# Patient Record
Sex: Male | Born: 1965 | Race: White | Hispanic: No | Marital: Married | Smoking: Former smoker
Health system: Southern US, Community
[De-identification: ages and names within clinical notes are randomized; demographics above are authoritative.]

## PROBLEM LIST (undated history)

## (undated) DIAGNOSIS — E119 Type 2 diabetes mellitus without complications: Secondary | ICD-10-CM

## (undated) DIAGNOSIS — I10 Essential (primary) hypertension: Secondary | ICD-10-CM

## (undated) DIAGNOSIS — I2510 Atherosclerotic heart disease of native coronary artery without angina pectoris: Secondary | ICD-10-CM

## (undated) DIAGNOSIS — I639 Cerebral infarction, unspecified: Secondary | ICD-10-CM

## (undated) DIAGNOSIS — I219 Acute myocardial infarction, unspecified: Secondary | ICD-10-CM

## (undated) DIAGNOSIS — E669 Obesity, unspecified: Secondary | ICD-10-CM

## (undated) DIAGNOSIS — J449 Chronic obstructive pulmonary disease, unspecified: Secondary | ICD-10-CM

## (undated) DIAGNOSIS — R0600 Dyspnea, unspecified: Secondary | ICD-10-CM

## (undated) DIAGNOSIS — E785 Hyperlipidemia, unspecified: Secondary | ICD-10-CM

## (undated) HISTORY — PX: NO PAST SURGERIES: SHX2092

---

## 2005-04-04 ENCOUNTER — Inpatient Hospital Stay (HOSPITAL_COMMUNITY): Admission: EM | Admit: 2005-04-04 | Discharge: 2005-04-07 | Payer: Self-pay | Admitting: Emergency Medicine

## 2006-01-12 ENCOUNTER — Emergency Department (HOSPITAL_COMMUNITY): Admission: EM | Admit: 2006-01-12 | Discharge: 2006-01-12 | Payer: Self-pay | Admitting: Emergency Medicine

## 2006-04-01 ENCOUNTER — Inpatient Hospital Stay (HOSPITAL_COMMUNITY): Admission: EM | Admit: 2006-04-01 | Discharge: 2006-04-02 | Payer: Self-pay | Admitting: Emergency Medicine

## 2013-11-04 ENCOUNTER — Other Ambulatory Visit (INDEPENDENT_AMBULATORY_CARE_PROVIDER_SITE_OTHER): Payer: Self-pay | Admitting: Surgery

## 2014-01-27 DIAGNOSIS — G629 Polyneuropathy, unspecified: Secondary | ICD-10-CM

## 2014-01-27 HISTORY — DX: Polyneuropathy, unspecified: G62.9

## 2016-03-17 ENCOUNTER — Other Ambulatory Visit (HOSPITAL_COMMUNITY): Payer: Self-pay | Admitting: Adult Health Nurse Practitioner

## 2016-03-17 ENCOUNTER — Ambulatory Visit (HOSPITAL_COMMUNITY)
Admission: RE | Admit: 2016-03-17 | Discharge: 2016-03-17 | Disposition: A | Discharge status: Home / Self Care | Payer: Self-pay | Source: Ambulatory Visit | Attending: Adult Health Nurse Practitioner | Admitting: Adult Health Nurse Practitioner

## 2016-03-17 DIAGNOSIS — M79661 Pain in right lower leg: Secondary | ICD-10-CM | POA: Insufficient documentation

## 2016-03-17 DIAGNOSIS — M7989 Other specified soft tissue disorders: Secondary | ICD-10-CM

## 2016-03-17 DIAGNOSIS — M25461 Effusion, right knee: Secondary | ICD-10-CM | POA: Insufficient documentation

## 2017-10-09 ENCOUNTER — Emergency Department (HOSPITAL_COMMUNITY): Payer: Self-pay

## 2017-10-09 ENCOUNTER — Other Ambulatory Visit: Payer: Self-pay

## 2017-10-09 ENCOUNTER — Encounter (HOSPITAL_COMMUNITY): Payer: Self-pay | Admitting: *Deleted

## 2017-10-09 ENCOUNTER — Emergency Department (HOSPITAL_COMMUNITY)
Admission: EM | Admit: 2017-10-09 | Discharge: 2017-10-09 | Disposition: A | Discharge status: Home / Self Care | Payer: Self-pay | Attending: Emergency Medicine | Admitting: Emergency Medicine

## 2017-10-09 DIAGNOSIS — F17200 Nicotine dependence, unspecified, uncomplicated: Secondary | ICD-10-CM | POA: Insufficient documentation

## 2017-10-09 DIAGNOSIS — R51 Headache: Secondary | ICD-10-CM

## 2017-10-09 DIAGNOSIS — I2510 Atherosclerotic heart disease of native coronary artery without angina pectoris: Secondary | ICD-10-CM | POA: Insufficient documentation

## 2017-10-09 DIAGNOSIS — R519 Headache, unspecified: Secondary | ICD-10-CM

## 2017-10-09 DIAGNOSIS — J9801 Acute bronchospasm: Secondary | ICD-10-CM | POA: Insufficient documentation

## 2017-10-09 DIAGNOSIS — Z8673 Personal history of transient ischemic attack (TIA), and cerebral infarction without residual deficits: Secondary | ICD-10-CM | POA: Insufficient documentation

## 2017-10-09 HISTORY — DX: Atherosclerotic heart disease of native coronary artery without angina pectoris: I25.10

## 2017-10-09 HISTORY — DX: Cerebral infarction, unspecified: I63.9

## 2017-10-09 HISTORY — DX: Acute myocardial infarction, unspecified: I21.9

## 2017-10-09 LAB — URINALYSIS, ROUTINE W REFLEX MICROSCOPIC
BILIRUBIN URINE: NEGATIVE
Glucose, UA: NEGATIVE mg/dL
Hgb urine dipstick: NEGATIVE
KETONES UR: NEGATIVE mg/dL
Leukocytes, UA: NEGATIVE
Nitrite: NEGATIVE
PH: 5 (ref 5.0–8.0)
Protein, ur: NEGATIVE mg/dL
Specific Gravity, Urine: 1.021 (ref 1.005–1.030)

## 2017-10-09 LAB — CBC
HCT: 42.1 % (ref 39.0–52.0)
Hemoglobin: 13.9 g/dL (ref 13.0–17.0)
MCH: 29.2 pg (ref 26.0–34.0)
MCHC: 33 g/dL (ref 30.0–36.0)
MCV: 88.4 fL (ref 78.0–100.0)
PLATELETS: 382 10*3/uL (ref 150–400)
RBC: 4.76 MIL/uL (ref 4.22–5.81)
RDW: 13.4 % (ref 11.5–15.5)
WBC: 10.8 10*3/uL — ABNORMAL HIGH (ref 4.0–10.5)

## 2017-10-09 LAB — COMPREHENSIVE METABOLIC PANEL
ALT: 16 U/L (ref 0–44)
ANION GAP: 7 (ref 5–15)
AST: 17 U/L (ref 15–41)
Albumin: 4 g/dL (ref 3.5–5.0)
Alkaline Phosphatase: 53 U/L (ref 38–126)
BUN: 18 mg/dL (ref 6–20)
CALCIUM: 9.2 mg/dL (ref 8.9–10.3)
CHLORIDE: 102 mmol/L (ref 98–111)
CO2: 27 mmol/L (ref 22–32)
Creatinine, Ser: 0.64 mg/dL (ref 0.61–1.24)
Glucose, Bld: 125 mg/dL — ABNORMAL HIGH (ref 70–99)
Potassium: 4.1 mmol/L (ref 3.5–5.1)
SODIUM: 136 mmol/L (ref 135–145)
Total Bilirubin: 0.5 mg/dL (ref 0.3–1.2)
Total Protein: 7.3 g/dL (ref 6.5–8.1)

## 2017-10-09 LAB — TROPONIN I

## 2017-10-09 MED ORDER — PREDNISONE 20 MG PO TABS: 40.0000 mg | ORAL_TABLET | Freq: Every day | ORAL | 0 refills | Status: DC

## 2017-10-09 MED ORDER — DIPHENHYDRAMINE HCL 50 MG/ML IJ SOLN
25.0000 mg | Freq: Once | INTRAMUSCULAR | Status: AC
  Administered 2017-10-09: 25 mg via INTRAVENOUS
  Filled 2017-10-09: qty 1

## 2017-10-09 MED ORDER — IPRATROPIUM-ALBUTEROL 0.5-2.5 (3) MG/3ML IN SOLN
3.0000 mL | Freq: Once | RESPIRATORY_TRACT | Status: AC
  Administered 2017-10-09: 3 mL via RESPIRATORY_TRACT
  Filled 2017-10-09: qty 3

## 2017-10-09 MED ORDER — FENTANYL CITRATE (PF) 100 MCG/2ML IJ SOLN
50.0000 ug | Freq: Once | INTRAMUSCULAR | Status: AC
  Administered 2017-10-09: 50 ug via INTRAVENOUS
  Filled 2017-10-09: qty 2

## 2017-10-09 MED ORDER — METOCLOPRAMIDE HCL 5 MG/ML IJ SOLN
10.0000 mg | Freq: Once | INTRAMUSCULAR | Status: AC
  Administered 2017-10-09: 10 mg via INTRAVENOUS
  Filled 2017-10-09: qty 2

## 2017-10-09 MED ORDER — ALBUTEROL SULFATE (2.5 MG/3ML) 0.083% IN NEBU
2.5000 mg | INHALATION_SOLUTION | Freq: Once | RESPIRATORY_TRACT | Status: AC
  Administered 2017-10-09: 2.5 mg via RESPIRATORY_TRACT
  Filled 2017-10-09: qty 3

## 2017-10-09 MED ORDER — FENTANYL CITRATE (PF) 100 MCG/2ML IJ SOLN
25.0000 ug | Freq: Once | INTRAMUSCULAR | Status: AC
  Administered 2017-10-09: 25 ug via INTRAVENOUS
  Filled 2017-10-09: qty 2

## 2017-10-09 NOTE — ED Notes (Signed)
Patient states right arm is tingling and burning in addition to severe headache. Patient was bit by multiple ticks in the past week. Hx of strokes.

## 2017-10-09 NOTE — ED Provider Notes (Signed)
[facility]ENCY DEPARTMENT Provider Note   CSN: [id number] Arrival date & time: 10/09/17  1704     History   Chief Complaint Chief Complaint  Patient presents with  . Headache    HPI Robert Rich is a 52 y.o. male.  HPI  Robert Rich is a 52 y.o. male with hx of CVA x 2, CAD, and MI x 2 who presents to the Emergency Department complaining of diffuse headache for 3 days.  He describes the headache as gradual in onset and associated with sensitivity to bright lights.  Today, the headache was more intense and he took one Excedrin migraine this morning and went back to bed.  Woke from a nap around 4:00 pm and headache worse and associated with diaphoresis, numbness and tingling sensation to his right arm and left face feels "sore to touch."  Noted to have elevated blood pressure at 180/120 at home.  He also complains of shortness of breath, wheezing and cough.  Shortness of breath worse with exertion.  Seen by his PCP 2 days ago for this.  No additional medications.  Takes Plavix daily.  HAs albuterol MDI at home, but has not used it recently.  He denies chest pain, difficulty with speech, vomiting, visual deficits,and extremity or facial weakness    Past Medical History:  Diagnosis Date  . Coronary artery disease   . Heart attack (HCC)   . Stroke [facility](HCC)     There are no active problems to display for this patient.   History reviewed. No pertinent surgical history.    Home Medications    Prior to Admission medications   Not on File    Family History History reviewed. No pertinent family history.  Social History Social History   Tobacco Use  . Smoking status: Current Some Day Smoker  . Smokeless tobacco: Never Used  Substance Use Topics  . Alcohol use: Not Currently  . Drug use: Not Currently     Allergies   Asa [aspirin]   Review of Systems Review of Systems  Constitutional: Negative for appetite change, chills and fever.  HENT: Negative for  congestion, sore throat and trouble swallowing.   Eyes: Positive for photophobia.  Respiratory: Positive for cough, shortness of breath and wheezing. Negative for chest tightness.   Cardiovascular: Negative for chest pain and leg swelling.  Gastrointestinal: Negative for abdominal pain, nausea and vomiting.  Genitourinary: Negative for dysuria and flank pain.  Musculoskeletal: Negative for arthralgias, neck pain and neck stiffness.  Skin: Negative for rash.  Neurological: Positive for headaches. Negative for dizziness, syncope, speech difficulty, weakness and numbness.  Hematological: Negative for adenopathy.  Psychiatric/Behavioral: Negative for confusion.     Physical Exam Updated Vital Signs BP (!) 138/92 (BP Location: Left Arm)   Pulse 69   Temp 98.4 F (36.9 C) (Oral)   Resp 18   Ht 5\' 10"  (1.778 m)   Wt 88.5 kg   SpO2 96%   BMI 27.98 kg/m   Physical Exam  Constitutional: He is oriented to person, place, and time. He appears well-developed and well-nourished. No distress.  HENT:  Head: Normocephalic and atraumatic.  Mouth/Throat: Oropharynx is clear and moist.  Eyes: Pupils are equal, round, and reactive to light. Conjunctivae and EOM are normal. Right eye exhibits normal extraocular motion. Left eye exhibits normal extraocular motion. Right pupil is round and reactive. Left pupil is round and reactive.  Neck: Normal range of motion and phonation normal. Neck supple. No spinous process  tenderness and no muscular tenderness present. No neck rigidity. No Kernig's sign noted.  Cardiovascular: Normal rate, regular rhythm and intact distal pulses.  Pulmonary/Chest: Effort normal. No respiratory distress. He has wheezes.  Scattered expiratory wheezes and coarse lung sounds.  Clears slightly with cough.  No rales.  No respiratory distress.    Abdominal: Soft. He exhibits no mass. There is no tenderness.  Musculoskeletal: Normal range of motion.  Neurological: He is alert and  oriented to person, place, and time. He has normal strength. No cranial nerve deficit or sensory deficit. He exhibits normal muscle tone. Coordination and gait normal. GCS eye subscore is 4. GCS verbal subscore is 5. GCS motor subscore is 6.  Reflex Scores:      Tricep reflexes are 2+ on the right side and 2+ on the left side.      Bicep reflexes are 2+ on the right side and 2+ on the left side. CN III-XII grossly intact, speech clear, no pronator drift, nml finger/nose and heel/shin testing.  Grip strength slightly diminished on the left compared to right.    Skin: Skin is warm and dry. Capillary refill takes less than 2 seconds. No rash noted.  Psychiatric: Thought content normal. His mood appears anxious.  Nursing note and vitals reviewed.    ED Treatments / Results  Labs (all labs ordered are listed, but only abnormal results are displayed) Labs Reviewed  COMPREHENSIVE METABOLIC PANEL - Abnormal; Notable for the following components:      Result Value   Glucose, Bld 125 (*)    All other components within normal limits  CBC - Abnormal; Notable for the following components:   WBC 10.8 (*)    All other components within normal limits  URINALYSIS, ROUTINE W REFLEX MICROSCOPIC  TROPONIN I    EKG EKG Interpretation  Date/Time:  Friday October 09 2017 18:34:46 EDT Ventricular Rate:  64 PR Interval:    QRS Duration: 85 QT Interval:  398 QTC Calculation: 411 R Axis:   14 Text Interpretation:  Sinus rhythm Confirmed by Loren Racer ([id number]) on 10/09/2017 6:40:22 PM   Radiology Ct Head Wo Contrast  Result Date: 10/09/2017 CLINICAL DATA:  Weakness and headaches. EXAM: CT HEAD WITHOUT CONTRAST TECHNIQUE: Contiguous axial images were obtained from the base of the skull through the vertex without intravenous contrast. COMPARISON:  04/04/2005 FINDINGS: Brain: There is an old infarct involving the posteromedial right cerebellar hemisphere. Negative for acute hemorrhage, mass lesion,  midline shift, hydrocephalus or new large infarct. Vascular: No hyperdense vessel or unexpected calcification. Skull: Normal. Negative for fracture or focal lesion. Sinuses/Orbits: Mucosal thickening in the left ethmoid air cells and visualized portion of the right maxillary sinus. Other: None IMPRESSION: No acute intracranial abnormality. Old right cerebellar infarct. Electronically Signed   By: Richarda Overlie M.D.   On: 10/09/2017 18:16    Procedures Procedures (including critical care time)  Medications Ordered in ED Medications  diphenhydrAMINE (BENADRYL) injection 25 mg (has no administration in time range)  metoCLOPramide (REGLAN) injection 10 mg (has no administration in time range)  fentaNYL (SUBLIMAZE) injection 25 mcg (has no administration in time range)     Initial Impression / Assessment and Plan / ED Course  I have reviewed the triage vital signs and the nursing notes.  Pertinent labs & imaging results that were available during my care of the patient were reviewed by me and considered in my medical decision making (see chart for details).     On recheck, pt  denies of improvement of headache.  CT head reassuring, awaiting laboratory studies.  2000 pt now reports headache improved.  Requesting food and tolerating fluids.  Has ambulated to the restroom and gait is steady.  Work up reassuring.  No concerning sx's to suggest emergent neurological process.  Speech remains clear.  No meningeal signs.  Lung sounds also improved after albuterol neb.  Pt has albuterol MDI at home, instructed to use 2 puffs QID for 2-3 days, then as as directed.  Will also prescribe prednisone.  He appears appropriate for d/c home.  Will f/u with his neurologist regarding headaches.  Return precautions discussed.    Final Clinical Impressions(s) / ED Diagnoses   Final diagnoses:  Headache disorder  Bronchospasm, acute    ED Discharge Orders    None       Rosey Bath 10/09/17 2226      Loren Racer, MD 10/10/17 2219

## 2017-10-09 NOTE — ED Notes (Signed)
Pt ambulated in hallway to bathroom with steady gain, denies weakness or nausea at this time.

## 2017-10-09 NOTE — ED Triage Notes (Signed)
Pt c/o weakness and HA x 3 days.

## 2017-10-09 NOTE — ED Notes (Signed)
Sprite given to patient.

## 2017-10-09 NOTE — Discharge Instructions (Addendum)
Continue to drink plenty of fluids.  Take it easy for a few days.  Use your albuterol inhaler 2 puffs 4 times a day for 2 to 3 days then as needed.  Follow-up with your primary doctor or your neurologist next week.  Return to the ER for any worsening symptoms.

## 2017-10-09 NOTE — ED Notes (Signed)
Sensitive to light 

## 2018-03-05 ENCOUNTER — Emergency Department (HOSPITAL_COMMUNITY): Payer: Self-pay

## 2018-03-05 ENCOUNTER — Emergency Department (HOSPITAL_COMMUNITY)
Admission: EM | Admit: 2018-03-05 | Discharge: 2018-03-05 | Disposition: A | Discharge status: Home / Self Care | Payer: Self-pay | Attending: Emergency Medicine | Admitting: Emergency Medicine

## 2018-03-05 ENCOUNTER — Encounter (HOSPITAL_COMMUNITY): Payer: Self-pay | Admitting: Emergency Medicine

## 2018-03-05 ENCOUNTER — Other Ambulatory Visit: Payer: Self-pay

## 2018-03-05 DIAGNOSIS — J111 Influenza due to unidentified influenza virus with other respiratory manifestations: Secondary | ICD-10-CM

## 2018-03-05 DIAGNOSIS — I2510 Atherosclerotic heart disease of native coronary artery without angina pectoris: Secondary | ICD-10-CM | POA: Insufficient documentation

## 2018-03-05 DIAGNOSIS — J101 Influenza due to other identified influenza virus with other respiratory manifestations: Secondary | ICD-10-CM | POA: Insufficient documentation

## 2018-03-05 DIAGNOSIS — Z79899 Other long term (current) drug therapy: Secondary | ICD-10-CM | POA: Insufficient documentation

## 2018-03-05 DIAGNOSIS — R1110 Vomiting, unspecified: Secondary | ICD-10-CM

## 2018-03-05 DIAGNOSIS — F17210 Nicotine dependence, cigarettes, uncomplicated: Secondary | ICD-10-CM | POA: Insufficient documentation

## 2018-03-05 DIAGNOSIS — R197 Diarrhea, unspecified: Secondary | ICD-10-CM

## 2018-03-05 LAB — INFLUENZA PANEL BY PCR (TYPE A & B)
INFLAPCR: POSITIVE — AB
Influenza B By PCR: NEGATIVE

## 2018-03-05 MED ORDER — ALBUTEROL SULFATE HFA 108 (90 BASE) MCG/ACT IN AERS
2.0000 | INHALATION_SPRAY | Freq: Once | RESPIRATORY_TRACT | Status: AC
  Administered 2018-03-05: 2 via RESPIRATORY_TRACT
  Filled 2018-03-05: qty 6.7

## 2018-03-05 MED ORDER — ALBUTEROL SULFATE (2.5 MG/3ML) 0.083% IN NEBU
2.5000 mg | INHALATION_SOLUTION | Freq: Once | RESPIRATORY_TRACT | Status: AC
  Administered 2018-03-05: 2.5 mg via RESPIRATORY_TRACT
  Filled 2018-03-05: qty 3

## 2018-03-05 MED ORDER — IPRATROPIUM-ALBUTEROL 0.5-2.5 (3) MG/3ML IN SOLN
3.0000 mL | Freq: Once | RESPIRATORY_TRACT | Status: AC
  Administered 2018-03-05: 3 mL via RESPIRATORY_TRACT
  Filled 2018-03-05: qty 3

## 2018-03-05 MED ORDER — ACETAMINOPHEN 325 MG PO TABS
650.0000 mg | ORAL_TABLET | Freq: Once | ORAL | Status: AC
  Administered 2018-03-05: 650 mg via ORAL
  Filled 2018-03-05: qty 2

## 2018-03-05 MED ORDER — ONDANSETRON 8 MG PO TBDP
8.0000 mg | ORAL_TABLET | Freq: Once | ORAL | Status: AC
  Administered 2018-03-05: 8 mg via ORAL
  Filled 2018-03-05: qty 1

## 2018-03-05 MED ORDER — PROMETHAZINE-DM 6.25-15 MG/5ML PO SYRP: 5.0000 mL | ORAL_SOLUTION | Freq: Four times a day (QID) | ORAL | 0 refills | Status: DC | PRN

## 2018-03-05 MED ORDER — ONDANSETRON HCL 4 MG PO TABS: 4.0000 mg | ORAL_TABLET | Freq: Four times a day (QID) | ORAL | 0 refills | Status: DC

## 2018-03-05 NOTE — Discharge Instructions (Addendum)
Frequent sips of fluids today, bland diet as tolerated.  Alternate Tylenol and ibuprofen 600 mg every 6-8 hours for body aches and fever.  Consistent with taking the Tylenol and ibuprofen so that your fever and body aches do not return.  1 to 2 puffs of the albuterol inhaler every 4-6 hours as needed.  The cough medication may cause drowsiness.  Follow-up with your primary doctor for recheck or return to the ER for any worsening symptoms.

## 2018-03-05 NOTE — ED Triage Notes (Signed)
Patient reports cough, body aches, diarrhea, and fever since Tuesday.

## 2018-03-06 NOTE — ED Provider Notes (Signed)
[facility] EMERGENCY DEPARTMENT Provider Note   CSN: [id number] Arrival date & time: 03/05/18  1213     History   Chief Complaint Chief Complaint  Patient presents with  . Influenza    HPI Robert Rich is a 53 y.o. male.  HPI   Robert Rich is a 53 y.o. male with hx of CAD, MI, CVA w/o extremity weakness, who presents to the Emergency Department complaining of generalized body aches, fever, shaking chills, nausea, vomiting, diarrhea and cough.  Symptoms began 3 days prior to arrival.  He reports excessive coughing that has been non-productive and cough associated with tightness of his upper chest.  Subjective fever at home.  No abdominal pain, but had few episodes of loose, watery stools and vomiting.  Has tolerated some fluids but no solid food.  He denies shortness of breath, chest pain, dysuria and sore throat.  Did not take a flu vaccine this season.  Took ibuprofen earlier   Past Medical History:  Diagnosis Date  . Coronary artery disease   . Heart attack (HCC)   . Stroke [facility])     There are no active problems to display for this patient.   History reviewed. No pertinent surgical history.    Home Medications    Prior to Admission medications   Medication Sig Start Date End Date Taking? Authorizing Provider  acetaminophen (TYLENOL) 500 MG tablet Take 500 mg by mouth every 6 (six) hours as needed for mild pain or moderate pain.   Yes [provider]  albuterol (PROVENTIL HFA;VENTOLIN HFA) 108 (90 Base) MCG/ACT inhaler Inhale 1-2 puffs into the lungs every 6 (six) hours as needed for wheezing or shortness of breath.  10/15/17  Yes [provider]  ALPRAZolam Prudy Feeler) 0.5 MG tablet Take 0.5 mg by mouth 2 (two) times daily as needed for anxiety.  01/14/18  Yes [provider]  guaiFENesin (MUCINEX) 600 MG 12 hr tablet Take 600 mg by mouth 2 (two) times daily as needed for cough.   Yes [provider]  ibuprofen (ADVIL,MOTRIN) 200 MG  tablet Take 200 mg by mouth every 6 (six) hours as needed for mild pain or moderate pain.   Yes [provider]  Pheniramine-PE-APAP (THERAFLU COLD & SORE THROAT) 20-10-325 MG PACK Take 1 packet by mouth daily as needed (for cold and flu symptoms).   Yes [provider]  ondansetron (ZOFRAN) 4 MG tablet Take 1 tablet (4 mg total) by mouth every 6 (six) hours. As needed for nausea or vomiting 03/05/18   Lorra Freeman, PA-C  promethazine-dextromethorphan (PROMETHAZINE-DM) 6.25-15 MG/5ML syrup Take 5 mLs by mouth 4 (four) times daily as needed. 03/05/18   Pauline Aus, PA-C    Family History Family History  Problem Relation Age of Onset  . Kidney disease Father     Social History Social History   Tobacco Use  . Smoking status: Current Some Day Smoker  . Smokeless tobacco: Never Used  Substance Use Topics  . Alcohol use: Not Currently  . Drug use: Not Currently     Allergies   Zolmitriptan and Asa [aspirin]   Review of Systems Review of Systems  Constitutional: Positive for appetite change, chills and fever. Negative for activity change.  HENT: Positive for congestion and rhinorrhea. Negative for facial swelling, sore throat and trouble swallowing.   Eyes: Negative for visual disturbance.  Respiratory: Positive for cough and chest tightness. Negative for shortness of breath, wheezing and stridor.   Gastrointestinal: Positive for  diarrhea, nausea and vomiting. Negative for abdominal pain.  Genitourinary: Negative for decreased urine volume and dysuria.  Musculoskeletal: Positive for myalgias (generalized body aches). Negative for back pain, neck pain and neck stiffness.  Skin: Negative for rash.  Neurological: Negative for dizziness, syncope, weakness, numbness and headaches.  Hematological: Negative for adenopathy.  Psychiatric/Behavioral: Negative for confusion.     Physical Exam Updated Vital Signs BP (!) 152/77 (BP Location: Right Arm)   Pulse 91    Temp 98.5 F (36.9 C) (Oral)   Resp 19   Ht 5\' 9"  (1.753 m)   Wt 90.7 kg   SpO2 98%   BMI 29.53 kg/m   Physical Exam Vitals signs and nursing note reviewed.  Constitutional:      General: He is not in acute distress.    Appearance: He is well-developed. He is not toxic-appearing.  HENT:     Head: Atraumatic.     Jaw: No trismus.     Right Ear: Tympanic membrane and ear canal normal.     Left Ear: Tympanic membrane and ear canal normal.     Nose: Mucosal edema and rhinorrhea present.     Mouth/Throat:     Mouth: Mucous membranes are moist.     Pharynx: Uvula midline. No oropharyngeal exudate, posterior oropharyngeal erythema or uvula swelling.     Tonsils: No tonsillar abscesses.  Eyes:     Conjunctiva/sclera: Conjunctivae normal.  Neck:     Musculoskeletal: Normal range of motion and neck supple.     Trachea: Phonation normal.     Meningeal: Brudzinski's sign and Kernig's sign absent.  Cardiovascular:     Rate and Rhythm: Normal rate and regular rhythm.     Heart sounds: No murmur.  Pulmonary:     Effort: Pulmonary effort is normal. No respiratory distress.     Breath sounds: Wheezing present. No rales.     Comments: Coarse lung sounds and expiratory wheezing throughout lung fields.  No respiratory distress Abdominal:     General: There is no distension.     Palpations: Abdomen is soft.     Tenderness: There is no abdominal tenderness. There is no guarding or rebound.  Musculoskeletal: Normal range of motion.     Right lower leg: No edema.     Left lower leg: No edema.  Lymphadenopathy:     Cervical: No cervical adenopathy.  Skin:    General: Skin is warm.     Findings: No rash.  Neurological:     General: No focal deficit present.     Mental Status: He is alert. Mental status is at baseline.     Sensory: No sensory deficit.     Motor: No weakness or abnormal muscle tone.  Psychiatric:        Mood and Affect: Mood normal.      ED Treatments / Results   Labs (all labs ordered are listed, but only abnormal results are displayed) Labs Reviewed  INFLUENZA PANEL BY PCR (TYPE A & B) - Abnormal; Notable for the following components:      Result Value   Influenza A By PCR POSITIVE (*)    All other components within normal limits    EKG None  Radiology Dg Chest 2 View  Result Date: 03/05/2018 CLINICAL DATA:  Cough and fever, body aches, diarrhea, and shortness of breath, symptoms for 3 days, feels like he "cannot get enough air", smoker, history coronary artery disease post MI, stroke EXAM: CHEST - 2 VIEW COMPARISON:  04/01/2006 FINDINGS: Normal heart size, mediastinal contours, and pulmonary vascularity. Lungs clear. No pleural effusion or pneumothorax. Bones unremarkable. IMPRESSION: No acute abnormalities. Electronically Signed   By: Ulyses SouthwardMark  Boles M.D.   On: 03/05/2018 13:59    Procedures Procedures (including critical care time)  Medications Ordered in ED Medications  acetaminophen (TYLENOL) tablet 650 mg (650 mg Oral Given 03/05/18 1637)  ipratropium-albuterol (DUONEB) 0.5-2.5 (3) MG/3ML nebulizer solution 3 mL (3 mLs Nebulization Given 03/05/18 1649)  albuterol (PROVENTIL) (2.5 MG/3ML) 0.083% nebulizer solution 2.5 mg (2.5 mg Nebulization Given 03/05/18 1649)  ondansetron (ZOFRAN-ODT) disintegrating tablet 8 mg (8 mg Oral Given 03/05/18 1637)  albuterol (PROVENTIL HFA;VENTOLIN HFA) 108 (90 Base) MCG/ACT inhaler 2 puff (2 puffs Inhalation Given 03/05/18 1749)     Initial Impression / Assessment and Plan / ED Course  I have reviewed the triage vital signs and the nursing notes.  Pertinent labs & imaging results that were available during my care of the patient were reviewed by me and considered in my medical decision making (see chart for details).     Pt with positive flu testing.  abd is soft and NT.  No concerning sx's for ACS or acute abdomen.  Will give albuterol neb, tylenol, zofran and oral fluid trial.  On recheck after albuterol  neb, lung sounds are greatly improved.  Pt reports feeling much better and has drank two cups of fluids.  No further vomiting or diarrhea.  States he is ready for d/c home, albuterol MDI dispensed.  Vital improved.  No clinical signs of dehydration.  rx for zofran and cough medication.  Agrees to tylenol and ibuprofen.  Strict return precautions discussed.   Final Clinical Impressions(s) / ED Diagnoses   Final diagnoses:  Influenza  Vomiting and diarrhea    ED Discharge Orders         Ordered    ondansetron (ZOFRAN) 4 MG tablet  Every 6 hours     03/05/18 1757    promethazine-dextromethorphan (PROMETHAZINE-DM) 6.25-15 MG/5ML syrup  4 times daily PRN     03/05/18 1757           Pauline Ausriplett, Aniruddh Ciavarella, PA-C 03/06/18 2354    Vanetta MuldersZackowski, Scott, MD 03/16/18 [id number]

## 2018-05-31 ENCOUNTER — Emergency Department (HOSPITAL_COMMUNITY)
Admission: EM | Admit: 2018-05-31 | Discharge: 2018-05-31 | Disposition: A | Discharge status: Home / Self Care | Payer: Self-pay | Attending: Emergency Medicine | Admitting: Emergency Medicine

## 2018-05-31 ENCOUNTER — Emergency Department (HOSPITAL_COMMUNITY): Payer: Self-pay

## 2018-05-31 ENCOUNTER — Encounter (HOSPITAL_COMMUNITY): Payer: Self-pay | Admitting: Emergency Medicine

## 2018-05-31 ENCOUNTER — Other Ambulatory Visit: Payer: Self-pay

## 2018-05-31 DIAGNOSIS — F17200 Nicotine dependence, unspecified, uncomplicated: Secondary | ICD-10-CM | POA: Insufficient documentation

## 2018-05-31 DIAGNOSIS — I2510 Atherosclerotic heart disease of native coronary artery without angina pectoris: Secondary | ICD-10-CM | POA: Insufficient documentation

## 2018-05-31 DIAGNOSIS — J441 Chronic obstructive pulmonary disease with (acute) exacerbation: Secondary | ICD-10-CM | POA: Insufficient documentation

## 2018-05-31 HISTORY — DX: Chronic obstructive pulmonary disease, unspecified: J44.9

## 2018-05-31 MED ORDER — IPRATROPIUM-ALBUTEROL 20-100 MCG/ACT IN AERS
4.0000 | INHALATION_SPRAY | Freq: Once | RESPIRATORY_TRACT | Status: AC
  Administered 2018-05-31: 4 via RESPIRATORY_TRACT
  Filled 2018-05-31: qty 4

## 2018-05-31 MED ORDER — PREDNISONE 20 MG PO TABS: 40.0000 mg | ORAL_TABLET | Freq: Every day | ORAL | 0 refills | Status: DC

## 2018-05-31 NOTE — ED Triage Notes (Signed)
PT c/o new dx of COPD this past feb 2020 and has neb tx and rescue inhaler at home. PT states over the past 3 days worsening in SOB with exertion with no relief from home treatments. PT went to [facility] in [place] and they called EMS and was transported to our ED for SOB. PT states he had an injection of steroids this am. EMS reports 2 duonebs given in route.

## 2018-05-31 NOTE — ED Provider Notes (Signed)
[facility]   CSN: [id number] Arrival date & time: 05/31/18  1234    History   Chief Complaint Chief Complaint  Patient presents with  . Shortness of Breath    HPI Robert Rich is a 53 y.o. male.     HPI   53yM with dyspnea. Hx of COPD. Breathing began getting worse over the past few days. Acutely worse very early this morning. Went to PCP shortly before arrival and by that time was really struggling. Given a steroid shot and EMS called. Received nebs and now feeling better. Chest feels tight, but not painful. No fever. No unusual leg pain or swelling.   Past Medical History:  Diagnosis Date  . COPD (chronic obstructive pulmonary disease) (HCC)   . Coronary artery disease   . Heart attack (HCC)   . Stroke [facility](HCC)     There are no active problems to display for this patient.   History reviewed. No pertinent surgical history.      Home Medications    Prior to Admission medications   Medication Sig Start Date End Date Taking? Authorizing Provider  acetaminophen (TYLENOL) 500 MG tablet Take 500 mg by mouth every 6 (six) hours as needed for mild pain or moderate pain.    [provider]  albuterol (PROVENTIL HFA;VENTOLIN HFA) 108 (90 Base) MCG/ACT inhaler Inhale 1-2 puffs into the lungs every 6 (six) hours as needed for wheezing or shortness of breath.  10/15/17   [provider]  ALPRAZolam Prudy Feeler(XANAX) 0.5 MG tablet Take 0.5 mg by mouth 2 (two) times daily as needed for anxiety.  01/14/18   [provider]  guaiFENesin (MUCINEX) 600 MG 12 hr tablet Take 600 mg by mouth 2 (two) times daily as needed for cough.    [provider]  ibuprofen (ADVIL,MOTRIN) 200 MG tablet Take 200 mg by mouth every 6 (six) hours as needed for mild pain or moderate pain.    [provider]  ondansetron (ZOFRAN) 4 MG tablet Take 1 tablet (4 mg total) by mouth every 6 (six) hours. As needed for nausea or vomiting 03/05/18    Triplett, Tammy, PA-C  Pheniramine-PE-APAP [facility](THERAFLU COLD & SORE THROAT) 20-10-325 MG PACK Take 1 packet by mouth daily as needed (for cold and flu symptoms).    [provider]  promethazine-dextromethorphan (PROMETHAZINE-DM) 6.25-15 MG/5ML syrup Take 5 mLs by mouth 4 (four) times daily as needed. 03/05/18   Pauline Ausriplett, Tammy, PA-C    Family History Family History  Problem Relation Age of Onset  . Kidney disease Father     Social History Social History   Tobacco Use  . Smoking status: Current Some Day Smoker    Packs/day: 0.50  . Smokeless tobacco: Never Used  Substance Use Topics  . Alcohol use: Not Currently  . Drug use: Not Currently     Allergies   Zolmitriptan and Asa [aspirin]   Review of Systems Review of Systems All systems reviewed and negative, other than as noted in HPI.   Physical Exam Updated Vital Signs BP (!) 150/83   Pulse 97   Temp 98.3 F (36.8 C) (Oral)   Resp (!) 22   Ht 5\' 10"  (1.778 m)   Wt 90.7 kg   SpO2 93%   BMI 28.70 kg/m   Physical Exam Vitals signs and nursing note reviewed.  Constitutional:      Appearance: He is well-developed.  HENT:     Head: Normocephalic and atraumatic.  Eyes:     General:        Right eye: No discharge.        Left eye: No discharge.     Conjunctiva/sclera: Conjunctivae normal.  Neck:     Musculoskeletal: Neck supple.  Cardiovascular:     Rate and Rhythm: Normal rate and regular rhythm.     Heart sounds: Normal heart sounds. No murmur. No friction rub. No gallop.   Pulmonary:     Breath sounds: Wheezing present.     Comments: tachypnea Abdominal:     General: There is no distension.     Palpations: Abdomen is soft.     Tenderness: There is no abdominal tenderness.  Musculoskeletal:        General: No tenderness.     Comments: Lower extremities symmetric as compared to each other. No calf tenderness. Negative Homan's. No palpable cords.   Skin:    General: Skin is warm and dry.   Neurological:     Mental Status: He is alert.  Psychiatric:        Behavior: Behavior normal.        Thought Content: Thought content normal.      ED Treatments / Results  Labs (all labs ordered are listed, but only abnormal results are displayed) Labs Reviewed - No data to display  EKG None  Radiology Dg Chest 2 View  Result Date: 05/31/2018 CLINICAL DATA:  Worsening shortness of breath for 3 days. EXAM: CHEST - 2 VIEW COMPARISON:  March 05, 2018 FINDINGS: The heart size and mediastinal contours are within normal limits. Both lungs are clear. The visualized skeletal structures are unremarkable. IMPRESSION: No active cardiopulmonary disease. Electronically Signed   By: Sherian Rein M.D.   On: 05/31/2018 13:27    Procedures Procedures (including critical care time)  Medications Ordered in ED Medications  Ipratropium-Albuterol (COMBIVENT) respimat 4 puff (4 puffs Inhalation Given 05/31/18 1322)     Initial Impression / Assessment and Plan / ED Course  I have reviewed the triage vital signs and the nursing notes.  Pertinent labs & imaging results that were available during my care of the patient were reviewed by me and considered in my medical decision making (see chart for details).     53yM with COPD exacerbation. Has turned around significantly. Received steroids by PCP. CXR w/o acute abnormality. Provided with combivent inhaler. He currently actually looks pretty comfortable and I think he is appropriate for outpt tx. Will put on prednisone for a few more days. Return precautions discussed. Outpt FU otherwise.   Final Clinical Impressions(s) / ED Diagnoses   Final diagnoses:  COPD with acute exacerbation [facility])    ED Discharge Orders    None       Raeford Razor, MD 05/31/18 1427

## 2018-07-08 ENCOUNTER — Other Ambulatory Visit: Payer: Self-pay

## 2018-07-08 ENCOUNTER — Emergency Department (HOSPITAL_COMMUNITY)
Admission: EM | Admit: 2018-07-08 | Discharge: 2018-07-08 | Disposition: A | Discharge status: Home / Self Care | Payer: Self-pay | Attending: Emergency Medicine | Admitting: Emergency Medicine

## 2018-07-08 ENCOUNTER — Encounter (HOSPITAL_COMMUNITY): Payer: Self-pay

## 2018-07-08 ENCOUNTER — Encounter (HOSPITAL_COMMUNITY): Payer: Self-pay | Admitting: Emergency Medicine

## 2018-07-08 DIAGNOSIS — R040 Epistaxis: Secondary | ICD-10-CM | POA: Insufficient documentation

## 2018-07-08 DIAGNOSIS — I252 Old myocardial infarction: Secondary | ICD-10-CM | POA: Insufficient documentation

## 2018-07-08 DIAGNOSIS — I2510 Atherosclerotic heart disease of native coronary artery without angina pectoris: Secondary | ICD-10-CM | POA: Insufficient documentation

## 2018-07-08 DIAGNOSIS — J449 Chronic obstructive pulmonary disease, unspecified: Secondary | ICD-10-CM | POA: Insufficient documentation

## 2018-07-08 DIAGNOSIS — Z8673 Personal history of transient ischemic attack (TIA), and cerebral infarction without residual deficits: Secondary | ICD-10-CM | POA: Insufficient documentation

## 2018-07-08 DIAGNOSIS — F17200 Nicotine dependence, unspecified, uncomplicated: Secondary | ICD-10-CM | POA: Insufficient documentation

## 2018-07-08 DIAGNOSIS — R51 Headache: Secondary | ICD-10-CM | POA: Insufficient documentation

## 2018-07-08 DIAGNOSIS — Z79899 Other long term (current) drug therapy: Secondary | ICD-10-CM | POA: Insufficient documentation

## 2018-07-08 LAB — CBC WITH DIFFERENTIAL/PLATELET
Abs Immature Granulocytes: 0.07 10*3/uL (ref 0.00–0.07)
Basophils Absolute: 0.1 10*3/uL (ref 0.0–0.1)
Basophils Relative: 1 %
Eosinophils Absolute: 0.1 10*3/uL (ref 0.0–0.5)
Eosinophils Relative: 0 %
HCT: 42.8 % (ref 39.0–52.0)
Hemoglobin: 14 g/dL (ref 13.0–17.0)
Immature Granulocytes: 1 %
Lymphocytes Relative: 20 %
Lymphs Abs: 2.9 10*3/uL (ref 0.7–4.0)
MCH: 29 pg (ref 26.0–34.0)
MCHC: 32.7 g/dL (ref 30.0–36.0)
MCV: 88.6 fL (ref 80.0–100.0)
Monocytes Absolute: 0.8 10*3/uL (ref 0.1–1.0)
Monocytes Relative: 5 %
Neutro Abs: 10.8 10*3/uL — ABNORMAL HIGH (ref 1.7–7.7)
Neutrophils Relative %: 73 %
Platelets: 399 10*3/uL (ref 150–400)
RBC: 4.83 MIL/uL (ref 4.22–5.81)
RDW: 14 % (ref 11.5–15.5)
WBC: 14.6 10*3/uL — ABNORMAL HIGH (ref 4.0–10.5)
nRBC: 0 % (ref 0.0–0.2)

## 2018-07-08 LAB — COMPREHENSIVE METABOLIC PANEL
ALT: 23 U/L (ref 0–44)
AST: 18 U/L (ref 15–41)
Albumin: 4.4 g/dL (ref 3.5–5.0)
Alkaline Phosphatase: 50 U/L (ref 38–126)
Anion gap: 13 (ref 5–15)
BUN: 23 mg/dL — ABNORMAL HIGH (ref 6–20)
CO2: 23 mmol/L (ref 22–32)
Calcium: 9.2 mg/dL (ref 8.9–10.3)
Chloride: 100 mmol/L (ref 98–111)
Creatinine, Ser: 0.7 mg/dL (ref 0.61–1.24)
GFR calc Af Amer: >60 mL/min (ref >60)
GFR calc non Af Amer: >60 mL/min (ref >60)
Glucose, Bld: 224 mg/dL — ABNORMAL HIGH (ref 70–99)
Potassium: 4.5 mmol/L (ref 3.5–5.1)
Sodium: 136 mmol/L (ref 135–145)
Total Bilirubin: 0.4 mg/dL (ref 0.3–1.2)
Total Protein: 7.6 g/dL (ref 6.5–8.1)

## 2018-07-08 LAB — PROTIME-INR
INR: 1 (ref 0.8–1.2)
Prothrombin Time: 12.6 seconds (ref 11.4–15.2)

## 2018-07-08 MED ORDER — OXYMETAZOLINE HCL 0.05 % NA SOLN
1.0000 | Freq: Once | NASAL | Status: AC
  Administered 2018-07-08: 10:00:00 1 via NASAL
  Filled 2018-07-08: qty 30

## 2018-07-08 MED ORDER — LIDOCAINE HCL 2 % EX GEL: 1.0000 "application " | Freq: Once | CUTANEOUS | Status: DC | PRN

## 2018-07-08 MED ORDER — HYDROCODONE-ACETAMINOPHEN 5-325 MG PO TABS: 1.0000 | ORAL_TABLET | ORAL | 0 refills | Status: DC | PRN

## 2018-07-08 MED ORDER — ALBUTEROL SULFATE HFA 108 (90 BASE) MCG/ACT IN AERS
2.0000 | INHALATION_SPRAY | Freq: Once | RESPIRATORY_TRACT | Status: AC
  Administered 2018-07-08: 15:00:00 2 via RESPIRATORY_TRACT
  Filled 2018-07-08: qty 6.7

## 2018-07-08 MED ORDER — OXYMETAZOLINE HCL 0.05 % NA SOLN
1.0000 | Freq: Once | NASAL | Status: AC | PRN
  Administered 2018-07-08: 1 via NASAL
  Filled 2018-07-08: qty 30

## 2018-07-08 NOTE — ED Notes (Signed)
ED Provider at bedside. 

## 2018-07-08 NOTE — ED Triage Notes (Signed)
Pt was here for nose bleed today at 0930.  Rhino rocket placed.  Pt's nose started to bleed again.

## 2018-07-08 NOTE — ED Triage Notes (Signed)
Pt reports nosebleed that started 45 min ago from left nare.  Reports has nosebleed 2 days ago.  Denies injury.

## 2018-07-08 NOTE — ED Provider Notes (Signed)
[facility] EMERGENCY DEPARTMENT Provider Note   CSN: [id number] Arrival date & time: 07/08/18  [id number]    History   Chief Complaint Chief Complaint  Patient presents with  . Epistaxis    HPI Robert Rich is a 53 y.o. male.     The history is provided by the patient.  Epistaxis Location:  L nare Duration:  45 minutes Timing:  Constant Progression:  Unchanged Chronicity:  Recurrent (also reports nose bleed 2 days ago.) Context: not anticoagulants, not bleeding disorder, not recent infection and not trauma   Relieved by:  Nothing (resolved on arrival here without intervention.) Worsened by:  Nothing Ineffective treatments:  Applying pressure (he used ice and pressure 2 days ago wtih resolution of bleeding) Associated symptoms: blood in oropharynx   Associated symptoms: no congestion, no dizziness, no headaches, no sinus pain and no sore throat   Associated symptoms comment:  Has been spitting blood this am   Past Medical History:  Diagnosis Date  . COPD (chronic obstructive pulmonary disease) (HCC)   . Coronary artery disease   . Heart attack (HCC)   . Stroke [facility](HCC)     There are no active problems to display for this patient.   History reviewed. No pertinent surgical history.      Home Medications    Prior to Admission medications   Medication Sig Start Date End Date Taking? Authorizing Provider  Fluticasone-Salmeterol (ADVAIR) 100-50 MCG/DOSE AEPB Inhale 1 puff into the lungs 2 (two) times daily.   Yes [provider]  acetaminophen (TYLENOL) 500 MG tablet Take 500 mg by mouth every 6 (six) hours as needed for mild pain or moderate pain.    [provider]  albuterol (PROVENTIL HFA;VENTOLIN HFA) 108 (90 Base) MCG/ACT inhaler Inhale 1-2 puffs into the lungs every 6 (six) hours as needed for wheezing or shortness of breath.  10/15/17   [provider]  ALPRAZolam Prudy Feeler(XANAX) 0.5 MG tablet Take 0.5 mg by mouth 2 (two) times daily as needed  for anxiety.  01/14/18   [provider]  guaiFENesin (MUCINEX) 600 MG 12 hr tablet Take 600 mg by mouth 2 (two) times daily as needed for cough.    [provider]  ibuprofen (ADVIL,MOTRIN) 200 MG tablet Take 200 mg by mouth every 6 (six) hours as needed for mild pain or moderate pain.    [provider]  ondansetron (ZOFRAN) 4 MG tablet Take 1 tablet (4 mg total) by mouth every 6 (six) hours. As needed for nausea or vomiting 03/05/18   Triplett, Tammy, PA-C  Pheniramine-PE-APAP [facility](THERAFLU COLD & SORE THROAT) 20-10-325 MG PACK Take 1 packet by mouth daily as needed (for cold and flu symptoms).    [provider]  predniSONE (DELTASONE) 20 MG tablet Take 2 tablets (40 mg total) by mouth daily. 05/31/18   Raeford RazorKohut, Stephen, MD  promethazine-dextromethorphan (PROMETHAZINE-DM) 6.25-15 MG/5ML syrup Take 5 mLs by mouth 4 (four) times daily as needed. 03/05/18   Pauline Ausriplett, Tammy, PA-C    Family History Family History  Problem Relation Age of Onset  . Kidney disease Father     Social History Social History   Tobacco Use  . Smoking status: Current Some Day Smoker    Packs/day: 0.50  . Smokeless tobacco: Never Used  Substance Use Topics  . Alcohol use: Not Currently  . Drug use: Not Currently     Allergies   Zolmitriptan and Asa [aspirin]   Review of Systems Review of Systems  HENT:  Positive for nosebleeds. Negative for congestion, sinus pain and sore throat.   Neurological: Negative for dizziness and headaches.     Physical Exam Updated Vital Signs BP (!) 150/96   Pulse 86   Temp 98.7 F (37.1 C) (Oral)   Resp 16   Ht 5\' 9"  (1.753 m)   Wt 86.6 kg   SpO2 96%   BMI 28.21 kg/m   Physical Exam Vitals signs and nursing note reviewed.  Constitutional:      Appearance: He is well-developed.  HENT:     Head: Normocephalic and atraumatic.     Nose:     Left Nostril: Epistaxis present.     Comments: Fresh blood left nare without active bleeding.  No  identifiable recently bleeding vessels seen.  No post nasal blood.  Eyes:     Conjunctiva/sclera: Conjunctivae normal.  Neck:     Musculoskeletal: Normal range of motion.  Cardiovascular:     Rate and Rhythm: Normal rate and regular rhythm.     Heart sounds: Normal heart sounds.  Pulmonary:     Effort: Pulmonary effort is normal.     Breath sounds: No wheezing.  Musculoskeletal: Normal range of motion.  Skin:    General: Skin is warm and dry.  Neurological:     Mental Status: He is alert.      ED Treatments / Results  Labs (all labs ordered are listed, but only abnormal results are displayed) Labs Reviewed - No data to display  EKG None  Radiology No results found.  Procedures .Epistaxis Management  Date/Time: 07/08/2018 10:05 AM Performed by: Evalee Jefferson, PA-C Authorized by: Evalee Jefferson, PA-C   Consent:    Consent obtained:  Verbal   Consent given by:  Patient   Risks discussed:  Bleeding, infection, nasal injury and pain   Alternatives discussed:  No treatment and alternative treatment Procedure details:    Treatment site:  L anterior   Treatment method:  Anterior pack   Treatment complexity:  Limited Post-procedure details:    Assessment:  Bleeding stopped   Patient tolerance of procedure:  Tolerated well, no immediate complications   (including critical care time)   Medications Ordered in ED Medications  oxymetazoline (AFRIN) 0.05 % nasal spray 1 spray (1 spray Each Nare Given 07/08/18 0957)     Initial Impression / Assessment and Plan / ED Course  I have reviewed the triage vital signs and the nursing notes.  Pertinent labs & imaging results that were available during my care of the patient were reviewed by me and considered in my medical decision making (see chart for details).        Pt with no active bleeding at this time, but with multiple episodes and stress over lost wages due to this problem, he is asking for "definitive tx" for this  condition.  Discussed options including afrin spray, packing, no indication for cautery as no identifiable blood vessel.  He was amenable to packing.  Plan for removal in 2 days.  Observed in ed with no bleeding, no posterior pharyngeal bleeding.  Final Clinical Impressions(s) / ED Diagnoses   Final diagnoses:  Left-sided epistaxis    ED Discharge Orders    None       Landis Martins 07/08/18 1106    Nat Christen, MD 07/09/18 [phone]

## 2018-07-08 NOTE — ED Provider Notes (Signed)
[facility]   CSN: [id number] Arrival date & time: 07/08/18  1150    History   Chief Complaint Chief Complaint  Patient presents with  . Epistaxis    HPI Robert Rich is a 53 y.o. male returns for a recheck after seen here this morning for nosebleed with rhinorocket insertion.  He reports pulling up to his shop (had not started working yet) when the left nostril started pouring blood again around the rhinorocket and had post nasal bleeding as well.  He endorses generalized headache at this time, he denies n/v, cp, dizziness.       The history is provided by the patient.    Past Medical History:  Diagnosis Date  . COPD (chronic obstructive pulmonary disease) (HCC)   . Coronary artery disease   . Heart attack (HCC)   . Stroke [facility](HCC)     There are no active problems to display for this patient.   History reviewed. No pertinent surgical history.      Home Medications    Prior to Admission medications   Medication Sig Start Date End Date Taking? Authorizing Provider  acetaminophen (TYLENOL) 500 MG tablet Take 500 mg by mouth every 6 (six) hours as needed for mild pain or moderate pain.   Yes [provider]  albuterol (PROVENTIL HFA;VENTOLIN HFA) 108 (90 Base) MCG/ACT inhaler Inhale 1-2 puffs into the lungs every 6 (six) hours as needed for wheezing or shortness of breath.  10/15/17  Yes [provider]  ALPRAZolam Prudy Feeler(XANAX) 0.5 MG tablet Take 0.5 mg by mouth 2 (two) times daily as needed for anxiety.  01/14/18  Yes [provider]  Fluticasone-Salmeterol (ADVAIR) 100-50 MCG/DOSE AEPB Inhale 1 puff into the lungs 2 (two) times daily.   Yes [provider]  ibuprofen (ADVIL,MOTRIN) 200 MG tablet Take 200 mg by mouth every 6 (six) hours as needed for mild pain or moderate pain.   Yes [provider]  HYDROcodone-acetaminophen (NORCO/VICODIN) 5-325 MG tablet Take 1 tablet by mouth every 4 (four)  hours as needed. 07/08/18   Tadhg Eskew, Raynelle FanningJulie, PA-C  ondansetron (ZOFRAN) 4 MG tablet Take 1 tablet (4 mg total) by mouth every 6 (six) hours. As needed for nausea or vomiting Patient not taking: Reported on 07/08/2018 03/05/18   Triplett, Tammy, PA-C  predniSONE (DELTASONE) 20 MG tablet Take 2 tablets (40 mg total) by mouth daily. Patient not taking: Reported on 07/08/2018 05/31/18   Raeford RazorKohut, Stephen, MD  promethazine-dextromethorphan (PROMETHAZINE-DM) 6.25-15 MG/5ML syrup Take 5 mLs by mouth 4 (four) times daily as needed. Patient not taking: Reported on 07/08/2018 03/05/18   Pauline Ausriplett, Tammy, PA-C    Family History Family History  Problem Relation Age of Onset  . Kidney disease Father     Social History Social History   Tobacco Use  . Smoking status: Current Some Day Smoker    Packs/day: 0.50  . Smokeless tobacco: Never Used  Substance Use Topics  . Alcohol use: Not Currently  . Drug use: Not Currently     Allergies   Zolmitriptan and Asa [aspirin]   Review of Systems Review of Systems  Constitutional: Negative for fever.  HENT: Positive for nosebleeds. Negative for congestion, sinus pressure and sore throat.   Eyes: Negative.   Respiratory: Negative for chest tightness and shortness of breath.   Cardiovascular: Negative for chest pain.  Gastrointestinal: Negative for abdominal pain and nausea.  Genitourinary: Negative.   Musculoskeletal: Negative for arthralgias, joint swelling and neck  pain.  Skin: Negative.  Negative for rash and wound.  Neurological: Positive for headaches. Negative for dizziness, weakness, light-headedness and numbness.  Psychiatric/Behavioral: Negative.      Physical Exam Updated Vital Signs BP (!) 178/102   Pulse 77   Temp 98.2 F (36.8 C) (Oral)   Resp 16   Ht 5\' 9"  (1.753 m)   Wt 86.6 kg   SpO2 97%   BMI 28.21 kg/m   Physical Exam Vitals signs and nursing note reviewed.  Constitutional:      Appearance: He is well-developed.  HENT:     Head:  Normocephalic and atraumatic.     Nose:     Left Nostril: Epistaxis present.     Comments: Bloody rhinorocket in place, no active bleeding currently.  No PND. Eyes:     Conjunctiva/sclera: Conjunctivae normal.  Neck:     Musculoskeletal: Normal range of motion.  Cardiovascular:     Rate and Rhythm: Normal rate and regular rhythm.     Heart sounds: Normal heart sounds.  Pulmonary:     Effort: Pulmonary effort is normal.     Breath sounds: Normal breath sounds. No wheezing.  Abdominal:     General: Bowel sounds are normal.     Palpations: Abdomen is soft.     Tenderness: There is no abdominal tenderness.  Musculoskeletal: Normal range of motion.  Skin:    General: Skin is warm and dry.  Neurological:     Mental Status: He is alert.      ED Treatments / Results  Labs (all labs ordered are listed, but only abnormal results are displayed) Labs Reviewed  CBC WITH DIFFERENTIAL/PLATELET - Abnormal; Notable for the following components:      Result Value   WBC 14.6 (*)    Neutro Abs 10.8 (*)    All other components within normal limits  COMPREHENSIVE METABOLIC PANEL - Abnormal; Notable for the following components:   Glucose, Bld 224 (*)    BUN 23 (*)    All other components within normal limits  PROTIME-INR    EKG None  Radiology No results found.  Procedures .Epistaxis Management  Date/Time: 07/08/2018 1:30 PM Performed by: Evalee Jefferson, PA-C Authorized by: Evalee Jefferson, PA-C   Consent:    Consent obtained:  Verbal   Consent given by:  Patient Procedure details:    Treatment site:  L posterior   Treatment method:  Nasal tampon (switched 4.5 cm rhinorocket, replaced with 7.5 posterior pack)   Treatment complexity:  Limited   Treatment episode: recurring   Post-procedure details:    Assessment:  Bleeding stopped   Patient tolerance of procedure:  Tolerated well, no immediate complications   (including critical care time)  Medications Ordered in ED  Medications  oxymetazoline (AFRIN) 0.05 % nasal spray 1 spray (1 spray Each Nare Given 07/08/18 1317)  albuterol (VENTOLIN HFA) 108 (90 Base) MCG/ACT inhaler 2 puff (2 puffs Inhalation Given 07/08/18 1524)     Initial Impression / Assessment and Plan / ED Course  I have reviewed the triage vital signs and the nursing notes.  Pertinent labs & imaging results that were available during my care of the patient were reviewed by me and considered in my medical decision making (see chart for details).        Pt observed in dept with no further bleeding from the nostril and no post nasal drainage.  Labs obtained and stable.  He was given return precautions, but also advised he would  [facility] if bleeding resumes.  Otherwise plan recheck in 2 days for rhinorocket removal.  Prior to dc, he reported need for his albuterol mdi which he left at work prior returning here.  He did not mild expiratory wheeze, albuterol mdi given with improved sx at time of dc.    Final Clinical Impressions(s) / ED Diagnoses   Final diagnoses:  Left-sided epistaxis    ED Discharge Orders         Ordered    HYDROcodone-acetaminophen (NORCO/VICODIN) 5-325 MG tablet  Every 4 hours PRN     07/08/18 1520           Burgess Amordol, Camdyn Beske, PA-C 07/08/18 1842    Donnetta Hutchingook, Brian, MD 07/09/18 [phone]

## 2018-07-08 NOTE — Discharge Instructions (Addendum)
If you have no further nosebleed, plan to have the packing removed in 2 days.  If you start to rebleed, I recommend going to [facility] emergency department where the ENT specialist (Dr. Lucia Gaskins today) would be able to be called in if needed to see you for this problem.

## 2018-07-10 ENCOUNTER — Emergency Department (HOSPITAL_COMMUNITY)
Admission: EM | Admit: 2018-07-10 | Discharge: 2018-07-10 | Disposition: A | Discharge status: Home / Self Care | Payer: Self-pay | Attending: Emergency Medicine | Admitting: Emergency Medicine

## 2018-07-10 ENCOUNTER — Encounter (HOSPITAL_COMMUNITY): Payer: Self-pay | Admitting: Emergency Medicine

## 2018-07-10 ENCOUNTER — Other Ambulatory Visit: Payer: Self-pay

## 2018-07-10 DIAGNOSIS — I259 Chronic ischemic heart disease, unspecified: Secondary | ICD-10-CM | POA: Insufficient documentation

## 2018-07-10 DIAGNOSIS — J449 Chronic obstructive pulmonary disease, unspecified: Secondary | ICD-10-CM | POA: Insufficient documentation

## 2018-07-10 DIAGNOSIS — F17210 Nicotine dependence, cigarettes, uncomplicated: Secondary | ICD-10-CM | POA: Insufficient documentation

## 2018-07-10 DIAGNOSIS — R040 Epistaxis: Secondary | ICD-10-CM | POA: Insufficient documentation

## 2018-07-10 DIAGNOSIS — Z79899 Other long term (current) drug therapy: Secondary | ICD-10-CM | POA: Insufficient documentation

## 2018-07-10 DIAGNOSIS — Z4800 Encounter for change or removal of nonsurgical wound dressing: Secondary | ICD-10-CM | POA: Insufficient documentation

## 2018-07-10 NOTE — ED Triage Notes (Signed)
Patient had rhino rocket placed 2 days ago. Returns today for removal.

## 2018-07-10 NOTE — Discharge Instructions (Addendum)
1. Medications: usual home medications 2. Treatment: rest, drink plenty of fluids, no strenuous activity, no blowing of the nose 3. Follow Up: Please followup with [facility] ([facility]) on Monday - call for an appointment.  Please return to the ER for return of nose bleeds, fever, headache, vision changes or other concerns.

## 2018-07-10 NOTE — ED Provider Notes (Signed)
[facility] Provider Note   CSN: [id number] Arrival date & time: 07/10/18  1405    History   Chief Complaint Chief Complaint  Patient presents with  . Remove nasal packing    HPI Robert Rich is a [age] y.o. male with a hx of COPD, CAD, MI, CVA presents to the Emergency Department for nasal packing removal.  Pt reports nose bleed yesterday with large packing placed.  He reports no bleeding since that time, but states now his face and throat hurt. He is requesting packing removal.  Pt denies blood thinner or ASA usage. He states he does not have insurance and does not want to wait to see the ENT as suggested.  He denies persistent bleeding since the pack placement. No aggravating factors.  Pt denies hx of HTN, nasal trauma or hx of nose bleeds.  Pt reports left frontal headache since packing placement.  No aggravating or alleviating factors. No pain control treatments at home.   Record review from visit yesterday shows concern for posterior bleed with posterior pack placed.       The history is provided by the patient and medical records. No language interpreter was used.    Past Medical History:  Diagnosis Date  . COPD (chronic obstructive pulmonary disease) (HCC)   . Coronary artery disease   . Heart attack (HCC)   . Stroke [facility](HCC)     There are no active problems to display for this patient.   History reviewed. No pertinent surgical history.      Home Medications    Prior to Admission medications   Medication Sig Start Date End Date Taking? Authorizing Provider  acetaminophen (TYLENOL) 500 MG tablet Take 500 mg by mouth every 6 (six) hours as needed for mild pain or moderate pain.    [provider]  albuterol (PROVENTIL HFA;VENTOLIN HFA) 108 (90 Base) MCG/ACT inhaler Inhale 1-2 puffs into the lungs every 6 (six) hours as needed for wheezing or shortness of breath.  10/15/17   [provider]  ALPRAZolam Prudy Feeler(XANAX) 0.5 MG tablet Take  0.5 mg by mouth 2 (two) times daily as needed for anxiety.  01/14/18   [provider]  Fluticasone-Salmeterol (ADVAIR) 100-50 MCG/DOSE AEPB Inhale 1 puff into the lungs 2 (two) times daily.    [provider]  HYDROcodone-acetaminophen (NORCO/VICODIN) 5-325 MG tablet Take 1 tablet by mouth every 4 (four) hours as needed. 07/08/18   Idol, Raynelle FanningJulie, PA-C  ibuprofen (ADVIL,MOTRIN) 200 MG tablet Take 200 mg by mouth every 6 (six) hours as needed for mild pain or moderate pain.    [provider]  ondansetron (ZOFRAN) 4 MG tablet Take 1 tablet (4 mg total) by mouth every 6 (six) hours. As needed for nausea or vomiting Patient not taking: Reported on 07/08/2018 03/05/18   Triplett, Tammy, PA-C  predniSONE (DELTASONE) 20 MG tablet Take 2 tablets (40 mg total) by mouth daily. Patient not taking: Reported on 07/08/2018 05/31/18   Raeford RazorKohut, Stephen, MD  promethazine-dextromethorphan (PROMETHAZINE-DM) 6.25-15 MG/5ML syrup Take 5 mLs by mouth 4 (four) times daily as needed. Patient not taking: Reported on 07/08/2018 03/05/18   Pauline Ausriplett, Tammy, PA-C    Family History Family History  Problem Relation Age of Onset  . Kidney disease Father     Social History Social History   Tobacco Use  . Smoking status: Current Some Day Smoker    Packs/day: 0.50  . Smokeless tobacco: Never Used  Substance Use Topics  . Alcohol  use: Not Currently  . Drug use: Not Currently     Allergies   Zolmitriptan and Asa [aspirin]   Review of Systems Review of Systems  Constitutional: Negative for appetite change, diaphoresis, fatigue, fever and unexpected weight change.  HENT: Positive for sinus pain and sore throat. Negative for mouth sores, nosebleeds, postnasal drip ( none since packing placement) and rhinorrhea.   Eyes: Negative for visual disturbance.  Respiratory: Negative for cough, chest tightness, shortness of breath and wheezing.   Cardiovascular: Negative for chest pain.  Gastrointestinal:  Negative for abdominal pain, constipation, diarrhea, nausea and vomiting.  Endocrine: Negative for polydipsia, polyphagia and polyuria.  Genitourinary: Negative for dysuria, frequency, hematuria and urgency.  Musculoskeletal: Negative for back pain and neck stiffness.  Skin: Negative for rash.  Allergic/Immunologic: Negative for immunocompromised state.  Neurological: Positive for headaches. Negative for syncope and light-headedness.  Hematological: Does not bruise/bleed easily.  Psychiatric/Behavioral: Negative for sleep disturbance. The patient is not nervous/anxious.      Physical Exam Updated Vital Signs BP (!) 178/106 (BP Location: Left Arm)   Pulse 95   Temp 98.1 F (36.7 C) (Oral)   Resp 14   Ht 5\' 9"  (1.753 m)   Wt 86.2 kg   SpO2 100%   BMI 28.06 kg/m   Physical Exam Vitals signs and nursing note reviewed.  Constitutional:      General: He is not in acute distress.    Appearance: He is well-developed.  HENT:     Head: Normocephalic and atraumatic.     Nose:     Comments: Packing in place in the left nare    Mouth/Throat:     Mouth: Mucous membranes are moist.     Pharynx: Oropharynx is clear.  Eyes:     General: No scleral icterus.    Conjunctiva/sclera: Conjunctivae normal.  Neck:     Musculoskeletal: Normal range of motion.  Cardiovascular:     Rate and Rhythm: Normal rate.  Pulmonary:     Effort: Pulmonary effort is normal.  Abdominal:     General: There is no distension.  Musculoskeletal: Normal range of motion.  Skin:    General: Skin is warm and dry.  Neurological:     Mental Status: He is alert.  Psychiatric:     Comments: Pt anxious and tearful, stating he is worried about his nosebleed and is tired of the uncomfortable packing.      ED Treatments / Results   Procedures Procedures (including critical care time)  Medications Ordered in ED Medications - No data to display   Initial Impression / Assessment and Plan / ED Course  I have  reviewed the triage vital signs and the nursing notes.  Pertinent labs & imaging results that were available during my care of the patient were reviewed by me and considered in my medical decision making (see chart for details).  Clinical Course as of Jul 09 1545  Sat Jul 10, 2018  1546 Improved BP  BP(!): 140/97 [HM]    Clinical Course User Index [HM] Revel Stellmach, Dahlia ClientHannah, PA-C        Pt presents for nasal packing removal.  He is very upset and hypertensive on arrival.  He is begging to have the packing removed.  I discussed with pt my concern for possible rebleed if packing is removed and recommended ENT evaluation prior to removal.  Pt wishes for removal at this time.  He states understanding of risk for re-bleed and need for additional treatment/packing if  this occurs.   Pt nasal packing removed without immediate bleeding.  Pt given something to drink.  He reports immediate relief of headache and facial pain.  BP has decreased significantly.    BP (!) 155/95   Pulse (!) 106   Temp 98.1 F (36.7 C) (Oral)   Resp 16   Ht 5\' 9"  (1.753 m)   Wt 86.2 kg   SpO2 97%   BMI 28.06 kg/m    3:46 PM Pt remains without epistaxis. Mild tachycardia on vitals, but pt antsy and won't sit still. Pt will be d/c home.  He will need close ENT follow-up on Monday morning. Pt states understanding and reports he will follow-up. Also discussed reasons to return to the ED.  Discussions were had with patient and fiance who was on the phone.  Both state understanding and are in agreement with the plan.      Final Clinical Impressions(s) / ED Diagnoses   Final diagnoses:  Encounter for removal of nasal packing    ED Discharge Orders    None       Loni Muse Gwenlyn Perking 07/10/18 [place], MD 07/11/18 [phone]

## 2018-10-21 ENCOUNTER — Other Ambulatory Visit: Payer: Self-pay

## 2018-10-21 ENCOUNTER — Encounter (HOSPITAL_COMMUNITY): Payer: Self-pay | Admitting: Emergency Medicine

## 2018-10-21 ENCOUNTER — Emergency Department (HOSPITAL_COMMUNITY)
Admission: EM | Admit: 2018-10-21 | Discharge: 2018-10-21 | Disposition: A | Discharge status: Home / Self Care | Payer: Self-pay | Attending: Emergency Medicine | Admitting: Emergency Medicine

## 2018-10-21 ENCOUNTER — Emergency Department (HOSPITAL_COMMUNITY): Payer: Self-pay

## 2018-10-21 DIAGNOSIS — F17210 Nicotine dependence, cigarettes, uncomplicated: Secondary | ICD-10-CM | POA: Insufficient documentation

## 2018-10-21 DIAGNOSIS — Z20828 Contact with and (suspected) exposure to other viral communicable diseases: Secondary | ICD-10-CM | POA: Insufficient documentation

## 2018-10-21 DIAGNOSIS — Z8673 Personal history of transient ischemic attack (TIA), and cerebral infarction without residual deficits: Secondary | ICD-10-CM | POA: Insufficient documentation

## 2018-10-21 DIAGNOSIS — I2510 Atherosclerotic heart disease of native coronary artery without angina pectoris: Secondary | ICD-10-CM | POA: Insufficient documentation

## 2018-10-21 DIAGNOSIS — Z79899 Other long term (current) drug therapy: Secondary | ICD-10-CM | POA: Insufficient documentation

## 2018-10-21 DIAGNOSIS — J441 Chronic obstructive pulmonary disease with (acute) exacerbation: Secondary | ICD-10-CM | POA: Insufficient documentation

## 2018-10-21 LAB — BASIC METABOLIC PANEL
Anion gap: 10 (ref 5–15)
BUN: 13 mg/dL (ref 6–20)
CO2: 24 mmol/L (ref 22–32)
Calcium: 8.6 mg/dL — ABNORMAL LOW (ref 8.9–10.3)
Chloride: 101 mmol/L (ref 98–111)
Creatinine, Ser: 0.71 mg/dL (ref 0.61–1.24)
GFR calc Af Amer: >60 mL/min (ref >60)
GFR calc non Af Amer: >60 mL/min (ref >60)
Glucose, Bld: 249 mg/dL — ABNORMAL HIGH (ref 70–99)
Potassium: 4.2 mmol/L (ref 3.5–5.1)
Sodium: 135 mmol/L (ref 135–145)

## 2018-10-21 LAB — CBC
HCT: 44.8 % (ref 39.0–52.0)
Hemoglobin: 14 g/dL (ref 13.0–17.0)
MCH: 28.4 pg (ref 26.0–34.0)
MCHC: 31.3 g/dL (ref 30.0–36.0)
MCV: 90.9 fL (ref 80.0–100.0)
Platelets: 383 10*3/uL (ref 150–400)
RBC: 4.93 MIL/uL (ref 4.22–5.81)
RDW: 13.3 % (ref 11.5–15.5)
WBC: 12.6 10*3/uL — ABNORMAL HIGH (ref 4.0–10.5)
nRBC: 0 % (ref 0.0–0.2)

## 2018-10-21 LAB — SARS CORONAVIRUS 2 (TAT 6-24 HRS): SARS Coronavirus 2: NEGATIVE

## 2018-10-21 MED ORDER — PREDNISONE 20 MG PO TABS
40.0000 mg | ORAL_TABLET | Freq: Once | ORAL | Status: AC
  Administered 2018-10-21: 40 mg via ORAL
  Filled 2018-10-21: qty 2

## 2018-10-21 MED ORDER — PREDNISONE 20 MG PO TABS: 40.0000 mg | ORAL_TABLET | Freq: Every day | ORAL | 0 refills | Status: DC

## 2018-10-21 MED ORDER — ALBUTEROL SULFATE HFA 108 (90 BASE) MCG/ACT IN AERS
2.0000 | INHALATION_SPRAY | RESPIRATORY_TRACT | Status: AC
  Administered 2018-10-21: 2 via RESPIRATORY_TRACT
  Filled 2018-10-21: qty 6.7

## 2018-10-21 MED ORDER — DOXYCYCLINE HYCLATE 100 MG PO CAPS: 100.0000 mg | ORAL_CAPSULE | Freq: Two times a day (BID) | ORAL | 0 refills | Status: DC

## 2018-10-21 MED ORDER — ACETAMINOPHEN 325 MG PO TABS
650.0000 mg | ORAL_TABLET | Freq: Once | ORAL | Status: AC
  Administered 2018-10-21: 650 mg via ORAL
  Filled 2018-10-21: qty 2

## 2018-10-21 NOTE — Discharge Instructions (Signed)
Your x-ray did not show any signs of pneumonia, please take the following medications exactly as prescribed  Prednisone 40 mg daily for 5 days Albuterol, 2 puffs with the spacer every 4 hours as needed or an albuterol nebulizer every 4 hours as needed Doxycycline, 100 mg twice daily for 10 days  Seek medical exam for severe or worsening symptoms
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
## 2018-10-21 NOTE — ED Triage Notes (Signed)
Pt c/o cough x 1 week, hx of COPD, pt has some audible wheezing. Chest heaviness to upper chest x 2 days. L arm numbness from fingers to shoulder x 2 weeks. Non diaphoretic at this time. EKG given to DR [facility]

## 2018-12-06 ENCOUNTER — Emergency Department (HOSPITAL_COMMUNITY): Payer: HRSA Program

## 2018-12-06 ENCOUNTER — Inpatient Hospital Stay (HOSPITAL_COMMUNITY)
Admission: EM | Admit: 2018-12-06 | Discharge: 2018-12-07 | DRG: 178 | Disposition: A | Discharge status: Home / Self Care | Payer: HRSA Program | Attending: Internal Medicine | Admitting: Internal Medicine

## 2018-12-06 ENCOUNTER — Other Ambulatory Visit: Payer: Self-pay

## 2018-12-06 ENCOUNTER — Encounter (HOSPITAL_COMMUNITY): Payer: Self-pay | Admitting: Emergency Medicine

## 2018-12-06 DIAGNOSIS — Z888 Allergy status to other drugs, medicaments and biological substances status: Secondary | ICD-10-CM

## 2018-12-06 DIAGNOSIS — Z720 Tobacco use: Secondary | ICD-10-CM

## 2018-12-06 DIAGNOSIS — F17210 Nicotine dependence, cigarettes, uncomplicated: Secondary | ICD-10-CM | POA: Diagnosis present

## 2018-12-06 DIAGNOSIS — J441 Chronic obstructive pulmonary disease with (acute) exacerbation: Secondary | ICD-10-CM | POA: Diagnosis not present

## 2018-12-06 DIAGNOSIS — Z8673 Personal history of transient ischemic attack (TIA), and cerebral infarction without residual deficits: Secondary | ICD-10-CM | POA: Diagnosis not present

## 2018-12-06 DIAGNOSIS — I252 Old myocardial infarction: Secondary | ICD-10-CM

## 2018-12-06 DIAGNOSIS — U071 COVID-19: Principal | ICD-10-CM

## 2018-12-06 DIAGNOSIS — Z886 Allergy status to analgesic agent status: Secondary | ICD-10-CM

## 2018-12-06 DIAGNOSIS — Z841 Family history of disorders of kidney and ureter: Secondary | ICD-10-CM

## 2018-12-06 DIAGNOSIS — F419 Anxiety disorder, unspecified: Secondary | ICD-10-CM | POA: Diagnosis present

## 2018-12-06 DIAGNOSIS — I2510 Atherosclerotic heart disease of native coronary artery without angina pectoris: Secondary | ICD-10-CM | POA: Diagnosis present

## 2018-12-06 DIAGNOSIS — I10 Essential (primary) hypertension: Secondary | ICD-10-CM | POA: Diagnosis present

## 2018-12-06 LAB — COMPREHENSIVE METABOLIC PANEL
ALT: 29 U/L (ref 0–44)
AST: 32 U/L (ref 15–41)
Albumin: 4 g/dL (ref 3.5–5.0)
Alkaline Phosphatase: 64 U/L (ref 38–126)
Anion gap: 12 (ref 5–15)
BUN: 17 mg/dL (ref 6–20)
CO2: 23 mmol/L (ref 22–32)
Calcium: 8.7 mg/dL — ABNORMAL LOW (ref 8.9–10.3)
Chloride: 100 mmol/L (ref 98–111)
Creatinine, Ser: 0.63 mg/dL (ref 0.61–1.24)
GFR calc Af Amer: >60 mL/min (ref >60)
GFR calc non Af Amer: >60 mL/min (ref >60)
Glucose, Bld: 230 mg/dL — ABNORMAL HIGH (ref 70–99)
Potassium: 4 mmol/L (ref 3.5–5.1)
Sodium: 135 mmol/L (ref 135–145)
Total Bilirubin: 0.5 mg/dL (ref 0.3–1.2)
Total Protein: 7.4 g/dL (ref 6.5–8.1)

## 2018-12-06 LAB — CBC WITH DIFFERENTIAL/PLATELET
Abs Immature Granulocytes: 0.01 10*3/uL (ref 0.00–0.07)
Basophils Absolute: 0.1 10*3/uL (ref 0.0–0.1)
Basophils Relative: 2 %
Eosinophils Absolute: 0.5 10*3/uL (ref 0.0–0.5)
Eosinophils Relative: 8 %
HCT: 46 % (ref 39.0–52.0)
Hemoglobin: 14.7 g/dL (ref 13.0–17.0)
Immature Granulocytes: 0 %
Lymphocytes Relative: 18 %
Lymphs Abs: 1.2 10*3/uL (ref 0.7–4.0)
MCH: 28.7 pg (ref 26.0–34.0)
MCHC: 32 g/dL (ref 30.0–36.0)
MCV: 89.7 fL (ref 80.0–100.0)
Monocytes Absolute: 0.9 10*3/uL (ref 0.1–1.0)
Monocytes Relative: 14 %
Neutro Abs: 3.6 10*3/uL (ref 1.7–7.7)
Neutrophils Relative %: 58 %
Platelets: 315 10*3/uL (ref 150–400)
RBC: 5.13 MIL/uL (ref 4.22–5.81)
RDW: 13.2 % (ref 11.5–15.5)
WBC: 6.3 10*3/uL (ref 4.0–10.5)
nRBC: 0 % (ref 0.0–0.2)

## 2018-12-06 LAB — RESPIRATORY PANEL BY PCR

## 2018-12-06 LAB — FERRITIN: Ferritin: 91 ng/mL (ref 24–336)

## 2018-12-06 LAB — D-DIMER, QUANTITATIVE: D-Dimer, Quant: 0.41 ug{FEU}/mL (ref 0.00–0.50)

## 2018-12-06 LAB — FIBRINOGEN: Fibrinogen: 389 mg/dL (ref 210–475)

## 2018-12-06 LAB — BRAIN NATRIURETIC PEPTIDE
B Natriuretic Peptide: 35 pg/mL (ref 0.0–100.0)
B Natriuretic Peptide: 76 pg/mL (ref 0.0–100.0)

## 2018-12-06 LAB — SARS CORONAVIRUS 2 (TAT 6-24 HRS): SARS Coronavirus 2: POSITIVE — AB

## 2018-12-06 LAB — C-REACTIVE PROTEIN: CRP: 1.9 mg/dL — ABNORMAL HIGH

## 2018-12-06 LAB — PROCALCITONIN: Procalcitonin: <0.1 ng/mL

## 2018-12-06 LAB — TROPONIN I (HIGH SENSITIVITY)
Troponin I (High Sensitivity): 3 ng/L (ref <18)
Troponin I (High Sensitivity): 2 ng/L (ref <18)

## 2018-12-06 MED ORDER — CHLORHEXIDINE GLUCONATE 0.12 % MT SOLN
15.0000 mL | Freq: Two times a day (BID) | OROMUCOSAL | Status: DC
  Administered 2018-12-06 – 2018-12-07 (×2): 15 mL via OROMUCOSAL
  Filled 2018-12-06: qty 15

## 2018-12-06 MED ORDER — ACETAMINOPHEN 650 MG RE SUPP: 650.0000 mg | Freq: Four times a day (QID) | RECTAL | Status: DC | PRN

## 2018-12-06 MED ORDER — ARFORMOTEROL TARTRATE 15 MCG/2ML IN NEBU
15.0000 ug | INHALATION_SOLUTION | Freq: Two times a day (BID) | RESPIRATORY_TRACT | Status: DC
  Filled 2018-12-06: qty 2

## 2018-12-06 MED ORDER — MAGNESIUM SULFATE 2 GM/50ML IV SOLN
2.0000 g | Freq: Once | INTRAVENOUS | Status: AC
  Administered 2018-12-06: 11:00:00 2 g via INTRAVENOUS
  Filled 2018-12-06: qty 50

## 2018-12-06 MED ORDER — ALBUTEROL SULFATE (2.5 MG/3ML) 0.083% IN NEBU
5.0000 mg | INHALATION_SOLUTION | Freq: Once | RESPIRATORY_TRACT | Status: AC
  Administered 2018-12-06: 5 mg via RESPIRATORY_TRACT
  Filled 2018-12-06: qty 6

## 2018-12-06 MED ORDER — KETOROLAC TROMETHAMINE 15 MG/ML IJ SOLN
15.0000 mg | Freq: Once | INTRAMUSCULAR | Status: AC
  Administered 2018-12-06: 15 mg via INTRAVENOUS
  Filled 2018-12-06: qty 1

## 2018-12-06 MED ORDER — ORAL CARE MOUTH RINSE
15.0000 mL | Freq: Two times a day (BID) | OROMUCOSAL | Status: DC
  Administered 2018-12-07: 15 mL via OROMUCOSAL

## 2018-12-06 MED ORDER — ONDANSETRON HCL 4 MG PO TABS: 4.0000 mg | ORAL_TABLET | Freq: Four times a day (QID) | ORAL | Status: DC | PRN

## 2018-12-06 MED ORDER — IPRATROPIUM-ALBUTEROL 0.5-2.5 (3) MG/3ML IN SOLN
3.0000 mL | Freq: Four times a day (QID) | RESPIRATORY_TRACT | Status: DC
  Filled 2018-12-06: qty 3

## 2018-12-06 MED ORDER — ACETAMINOPHEN 325 MG PO TABS
650.0000 mg | ORAL_TABLET | Freq: Four times a day (QID) | ORAL | Status: DC | PRN
  Administered 2018-12-07: 650 mg via ORAL
  Filled 2018-12-06: qty 2

## 2018-12-06 MED ORDER — AZITHROMYCIN 250 MG PO TABS
500.0000 mg | ORAL_TABLET | Freq: Every day | ORAL | Status: DC
  Administered 2018-12-06 – 2018-12-07 (×2): 500 mg via ORAL
  Filled 2018-12-06 (×2): qty 2

## 2018-12-06 MED ORDER — LABETALOL HCL 5 MG/ML IV SOLN: 20.0000 mg | INTRAVENOUS | Status: DC | PRN

## 2018-12-06 MED ORDER — ENOXAPARIN SODIUM 40 MG/0.4ML ~~LOC~~ SOLN: 40.0000 mg | SUBCUTANEOUS | Status: DC

## 2018-12-06 MED ORDER — IPRATROPIUM BROMIDE 0.02 % IN SOLN
0.5000 mg | Freq: Once | RESPIRATORY_TRACT | Status: AC
  Administered 2018-12-06: 0.5 mg via RESPIRATORY_TRACT
  Filled 2018-12-06: qty 2.5

## 2018-12-06 MED ORDER — ONDANSETRON HCL 4 MG/2ML IJ SOLN: 4.0000 mg | Freq: Four times a day (QID) | INTRAMUSCULAR | Status: DC | PRN

## 2018-12-06 MED ORDER — ALPRAZOLAM 0.5 MG PO TABS
0.5000 mg | ORAL_TABLET | Freq: Two times a day (BID) | ORAL | Status: DC | PRN
  Administered 2018-12-06: 0.5 mg via ORAL
  Filled 2018-12-06: qty 1

## 2018-12-06 MED ORDER — CLONIDINE HCL 0.1 MG PO TABS
0.1000 mg | ORAL_TABLET | Freq: Once | ORAL | Status: AC
  Administered 2018-12-06: 0.1 mg via ORAL
  Filled 2018-12-06: qty 1

## 2018-12-06 MED ORDER — ENOXAPARIN SODIUM 40 MG/0.4ML ~~LOC~~ SOLN
40.0000 mg | SUBCUTANEOUS | Status: DC
  Administered 2018-12-06: 40 mg via SUBCUTANEOUS
  Filled 2018-12-06: qty 0.4

## 2018-12-06 MED ORDER — IPRATROPIUM BROMIDE HFA 17 MCG/ACT IN AERS: 2.0000 | INHALATION_SPRAY | Freq: Once | RESPIRATORY_TRACT | Status: DC

## 2018-12-06 MED ORDER — METHYLPREDNISOLONE SODIUM SUCC 125 MG IJ SOLR
125.0000 mg | Freq: Once | INTRAMUSCULAR | Status: AC
  Administered 2018-12-06: 11:00:00 125 mg via INTRAVENOUS
  Filled 2018-12-06: qty 2

## 2018-12-06 MED ORDER — METHYLPREDNISOLONE SODIUM SUCC 125 MG IJ SOLR
60.0000 mg | Freq: Four times a day (QID) | INTRAMUSCULAR | Status: DC
  Administered 2018-12-06 – 2018-12-07 (×4): 60 mg via INTRAVENOUS
  Filled 2018-12-06 (×4): qty 2

## 2018-12-06 MED ORDER — BUDESONIDE 0.5 MG/2ML IN SUSP
0.5000 mg | Freq: Two times a day (BID) | RESPIRATORY_TRACT | Status: DC
  Filled 2018-12-06: qty 2

## 2018-12-06 MED ORDER — ALBUTEROL SULFATE HFA 108 (90 BASE) MCG/ACT IN AERS
6.0000 | INHALATION_SPRAY | RESPIRATORY_TRACT | Status: DC | PRN
  Filled 2018-12-06: qty 6.7

## 2018-12-06 MED ORDER — ALBUTEROL SULFATE HFA 108 (90 BASE) MCG/ACT IN AERS
6.0000 | INHALATION_SPRAY | Freq: Once | RESPIRATORY_TRACT | Status: AC
  Administered 2018-12-06: 14:00:00 6 via RESPIRATORY_TRACT

## 2018-12-06 MED ORDER — BUDESONIDE 0.5 MG/2ML IN SUSP
0.5000 mg | Freq: Once | RESPIRATORY_TRACT | Status: AC
  Administered 2018-12-06: 0.5 mg via RESPIRATORY_TRACT
  Filled 2018-12-06: qty 2

## 2018-12-06 NOTE — ED Notes (Signed)
Have notified Dr. Roderic Palau that pt has begun to cough and get more SOB. Pt sweating profusely

## 2018-12-06 NOTE — ED Provider Notes (Addendum)
[facility] EMERGENCY DEPARTMENT Provider Note   CSN: [id number] Arrival date & time: 12/06/18  1027     History   Chief Complaint Chief Complaint  Patient presents with  . Shortness of Breath    HPI Robert Rich is a 53 y.o. male.     Patient complains of shortness of breath.  Patient has a history of COPD.  He is also had fever and mild cough  The history is provided by the patient. No language interpreter was used.  Shortness of Breath Severity:  Moderate Onset quality:  Sudden Timing:  Constant Progression:  Worsening Chronicity:  Recurrent Context: activity   Relieved by:  Nothing Associated symptoms: wheezing   Associated symptoms: no abdominal pain, no chest pain, no cough, no headaches and no rash     Past Medical History:  Diagnosis Date  . COPD (chronic obstructive pulmonary disease) ([facility])   . Coronary artery disease   . Heart attack ([facility])   . Stroke [facility])     There are no active problems to display for this patient.   History reviewed. No pertinent surgical history.      Home Medications    Prior to Admission medications   Medication Sig Start Date End Date Taking? Authorizing Provider  acetaminophen (TYLENOL) 500 MG tablet Take 500 mg by mouth every 6 (six) hours as needed for mild pain or moderate pain.   Yes [provider]  ALPRAZolam Prudy Feeler) 0.5 MG tablet Take 0.5 mg by mouth 2 (two) times daily as needed for anxiety.  01/14/18  Yes [provider]  budesonide-formoterol (SYMBICORT) 160-4.5 MCG/ACT inhaler Inhale 2 puffs into the lungs 2 (two) times daily. 05/31/18  Yes [provider]  ipratropium-albuterol (DUONEB) 0.5-2.5 (3) MG/3ML SOLN Inhale 3 mLs into the lungs 4 (four) times daily as needed. 04/20/18  Yes [provider]  albuterol (PROVENTIL HFA;VENTOLIN HFA) 108 (90 Base) MCG/ACT inhaler Inhale 1-2 puffs into the lungs every 6 (six) hours as needed for wheezing or shortness of breath.  10/15/17    [provider]  doxycycline (VIBRAMYCIN) 100 MG capsule Take 1 capsule (100 mg total) by mouth 2 (two) times daily. Patient not taking: Reported on 12/06/2018 10/21/18   Eber Hong, MD  HYDROcodone-acetaminophen (NORCO/VICODIN) 5-325 MG tablet Take 1 tablet by mouth every 4 (four) hours as needed. Patient not taking: Reported on 12/06/2018 07/08/18   Burgess Amor, PA-C  ondansetron (ZOFRAN) 4 MG tablet Take 1 tablet (4 mg total) by mouth every 6 (six) hours. As needed for nausea or vomiting Patient not taking: Reported on 07/08/2018 03/05/18   Triplett, Tammy, PA-C  predniSONE (DELTASONE) 20 MG tablet Take 2 tablets (40 mg total) by mouth daily. Patient not taking: Reported on 12/06/2018 10/21/18   Eber Hong, MD  promethazine-dextromethorphan (PROMETHAZINE-DM) 6.25-15 MG/5ML syrup Take 5 mLs by mouth 4 (four) times daily as needed. Patient not taking: Reported on 07/08/2018 03/05/18   Pauline Aus, PA-C    Family History Family History  Problem Relation Age of Onset  . Kidney disease Father     Social History Social History   Tobacco Use  . Smoking status: Current Some Day Smoker    Packs/day: 0.10  . Smokeless tobacco: Never Used  Substance Use Topics  . Alcohol use: Not Currently  . Drug use: Not Currently     Allergies   Zolmitriptan and Asa [aspirin]   Review of Systems Review of Systems  Constitutional: Negative for appetite change and fatigue.  HENT: Negative for congestion, ear discharge and sinus pressure.   Eyes: Negative for discharge.  Respiratory: Positive for shortness of breath and wheezing. Negative for cough.   Cardiovascular: Negative for chest pain.  Gastrointestinal: Negative for abdominal pain and diarrhea.  Genitourinary: Negative for frequency and hematuria.  Musculoskeletal: Negative for back pain.  Skin: Negative for rash.  Neurological: Negative for seizures and headaches.  Psychiatric/Behavioral: Negative for hallucinations.      Physical Exam Updated Vital Signs BP 110/84   Pulse 81   Temp 98.4 F (36.9 C) (Oral)   Resp (!) 25   Ht 5\' 9"  (1.753 m)   Wt 88.5 kg   SpO2 96%   BMI 28.80 kg/m   Physical Exam Vitals signs and nursing note reviewed.  Constitutional:      Appearance: He is well-developed.  HENT:     Head: Normocephalic.     Nose: Nose normal.  Eyes:     General: No scleral icterus.    Conjunctiva/sclera: Conjunctivae normal.  Neck:     Musculoskeletal: Neck supple.     Thyroid: No thyromegaly.  Cardiovascular:     Rate and Rhythm: Normal rate and regular rhythm.     Heart sounds: No murmur. No friction rub. No gallop.   Pulmonary:     Breath sounds: No stridor. Wheezing present. No rales.  Chest:     Chest wall: No tenderness.  Abdominal:     General: There is no distension.     Tenderness: There is no abdominal tenderness. There is no rebound.  Musculoskeletal: Normal range of motion.  Lymphadenopathy:     Cervical: No cervical adenopathy.  Skin:    Findings: No erythema or rash.  Neurological:     Mental Status: He is oriented to person, place, and time.     Motor: No abnormal muscle tone.     Coordination: Coordination normal.  Psychiatric:        Behavior: Behavior normal.      ED Treatments / Results  Labs (all labs ordered are listed, but only abnormal results are displayed) Labs Reviewed  COMPREHENSIVE METABOLIC PANEL - Abnormal; Notable for the following components:      Result Value   Glucose, Bld 230 (*)    Calcium 8.7 (*)    All other components within normal limits  SARS CORONAVIRUS 2 ([name] 6-24 HRS)  CBC WITH DIFFERENTIAL/PLATELET  BRAIN NATRIURETIC PEPTIDE  TROPONIN I (HIGH SENSITIVITY)  TROPONIN I (HIGH SENSITIVITY)    EKG None  Radiology Dg Chest Portable 1 View  Result Date: 12/06/2018 CLINICAL DATA:  Shortness of breath and productive cough for 4 days. EXAM: PORTABLE CHEST 1 VIEW COMPARISON:  Single-view of the chest 10/21/2018 and  04/01/2006. FINDINGS: Lungs are clear. Heart size is normal. No pneumothorax or pleural fluid. No acute or focal bony abnormality. IMPRESSION: No acute disease. Electronically Signed   By: Drusilla Kannerhomas  Dalessio M.D.   On: 12/06/2018 11:41    Procedures Procedures (including critical care time)  Medications Ordered in ED Medications  albuterol (VENTOLIN HFA) 108 (90 Base) MCG/ACT inhaler 6 puff (has no administration in time range)  ipratropium (ATROVENT) nebulizer solution 0.5 mg (has no administration in time range)  methylPREDNISolone sodium succinate (SOLU-MEDROL) 125 mg/2 mL injection 125 mg (125 mg Intravenous Given 12/06/18 1122)  magnesium sulfate IVPB 2 g 50 mL (0 g Intravenous Stopped 12/06/18 1232)  albuterol (VENTOLIN HFA) 108 (90 Base) MCG/ACT inhaler 6 puff (6 puffs Inhalation Given 12/06/18 1337)  Initial Impression / Assessment and Plan / ED Course  I have reviewed the triage vital signs and the nursing notes.  Pertinent labs & imaging results that were available during my care of the patient were reviewed by me and considered in my medical decision making (see chart for details).    CRITICAL CARE Performed by: Milton Ferguson Total critical care time:57minutes Critical care time was exclusive of separately billable procedures and treating other patients. Critical care was necessary to treat or prevent imminent or life-threatening deterioration. Critical care was time spent personally by me on the following activities: development of treatment plan with patient and/or surrogate as well as nursing, discussions with consultants, evaluation of patient's response to treatment, examination of patient, obtaining history from patient or surrogate, ordering and performing treatments and interventions, ordering and review of laboratory studies, ordering and review of radiographic studies, pulse oximetry and re-evaluation of patient's condition.     Patient with COPD exacerbation.  He is  not improving enough with inhalers.  He will be given a neb treatment and admitted to medicine for COPD exacerbation  Final Clinical Impressions(s) / ED Diagnoses   Final diagnoses:  COPD exacerbation [facility])    ED Discharge Orders    None       Milton Ferguson, MD 12/06/18 1410    Milton Ferguson, MD 12/06/18 1413

## 2018-12-06 NOTE — ED Notes (Signed)
Pt has requested to be unhooked from the monitor for a little while. Pt sats have been WDL. Have unhooked per pt request

## 2018-12-06 NOTE — ED Triage Notes (Signed)
Pt states that he is having sob this has been going on for 4 days. He states that he has coughing up brown and green with blood in it.

## 2018-12-06 NOTE — ED Notes (Signed)
Have notified respiratory.  

## 2018-12-06 NOTE — Progress Notes (Signed)
Pt given one time nebulizer treatment after placement in negative pressure room

## 2018-12-06 NOTE — H&P (Signed)
History and Physical  Robert Rich KGY:[id number] DOB: [birth year] DOA: 12/06/2018   PCP: Bridget Hartshorn, NP   Patient coming from: Home  Chief Complaint: dyspnea cough  HPI:  Robert Rich is a 53 y.o. male with medical history of COPD, coronary disease, stroke presenting with 1 week history of shortness of breath and coughing with yellow-green sputum.  The patient recently was seen in the emergency department on 10/21/2018 for COPD exacerbation.  He was placed on doxycycline and prednisone taper at that time.  He had some improvement for period of time, but has had increasing shortness of breath for the past week.  He has continued to smoke up to 3 days prior to this admission.  In addition, the patient spouse also smokes in the house.  He denies any fevers, chills, headache, neck pain, chest pain, nausea, vomiting, diarrhea, abdominal pain, hematochezia, melena.  He has had some blood-tinged sputum in the past 24 hours. In the emergency department, the patient was afebrile hemodynamically stable saturating 85% room air.  BMP, LFTs, and CBC were unremarkable.  Initial troponin was 3.  BNP was 35.  Chest x-ray was negative for any acute changes.  EKG shows sinus rhythm with nonspecific T wave changes.  Assessment/Plan: COPD exacerbation -Start duo nebs -Start Pulmicort -Start Brovana -Solu-Medrol -Viral respiratory panel  Tobacco abuse -Tobacco cessation discussed -I have discussed tobacco cessation with the patient.  I have counseled the patient regarding the negative impacts of continued tobacco use including but not limited to lung cancer, COPD, and cardiovascular disease.  I have discussed alternatives to tobacco and modalities that may help facilitate tobacco cessation including but not limited to biofeedback, hypnosis, and medications.  Total time spent with tobacco counseling was 4 minutes.   Coronary artery disease -No chest pain presently -EKG with sinus rhythm and  nonspecific T wave changes  Anxiety -Continue as needed Xanax       Past Medical History:  Diagnosis Date  . COPD (chronic obstructive pulmonary disease) ([place])   . Coronary artery disease   . Heart attack ([place])   . Stroke [facility])    History reviewed. No pertinent surgical history. Social History:  reports that he has been smoking. He has been smoking about 0.10 packs per day. He has never used smokeless tobacco. He reports previous alcohol use. He reports previous drug use.   Family History  Problem Relation Age of Onset  . Kidney disease Father      Allergies  Allergen Reactions  . Zolmitriptan Nausea And Vomiting    Severe headaches  . Asa [Aspirin] Hives     Prior to Admission medications   Medication Sig Start Date End Date Taking? Authorizing Provider  acetaminophen (TYLENOL) 500 MG tablet Take 500 mg by mouth every 6 (six) hours as needed for mild pain or moderate pain.   Yes [provider]  ALPRAZolam Duanne Moron) 0.5 MG tablet Take 0.5 mg by mouth 2 (two) times daily as needed for anxiety.  01/14/18  Yes [provider]  budesonide-formoterol (SYMBICORT) 160-4.5 MCG/ACT inhaler Inhale 2 puffs into the lungs 2 (two) times daily. 05/31/18  Yes [provider]  ipratropium-albuterol (DUONEB) 0.5-2.5 (3) MG/3ML SOLN Inhale 3 mLs into the lungs 4 (four) times daily as needed. 04/20/18  Yes [provider]  albuterol (PROVENTIL HFA;VENTOLIN HFA) 108 (90 Base) MCG/ACT inhaler Inhale 1-2 puffs into the lungs every 6 (six) hours as needed for wheezing or shortness of breath.  10/15/17   [provider]  doxycycline (VIBRAMYCIN) 100 MG capsule Take 1 capsule (100 mg total) by mouth 2 (two) times daily. Patient not taking: Reported on 12/06/2018 10/21/18   Eber HongMiller, Brian, MD  HYDROcodone-acetaminophen (NORCO/VICODIN) 5-325 MG tablet Take 1 tablet by mouth every 4 (four) hours as needed. Patient not taking: Reported on 12/06/2018 07/08/18   Burgess AmorIdol,  Julie, PA-C  ondansetron (ZOFRAN) 4 MG tablet Take 1 tablet (4 mg total) by mouth every 6 (six) hours. As needed for nausea or vomiting Patient not taking: Reported on 07/08/2018 03/05/18   Triplett, Tammy, PA-C  predniSONE (DELTASONE) 20 MG tablet Take 2 tablets (40 mg total) by mouth daily. Patient not taking: Reported on 12/06/2018 10/21/18   Eber HongMiller, Brian, MD  promethazine-dextromethorphan (PROMETHAZINE-DM) 6.25-15 MG/5ML syrup Take 5 mLs by mouth 4 (four) times daily as needed. Patient not taking: Reported on 07/08/2018 03/05/18   Pauline Ausriplett, Tammy, PA-C    Review of Systems:  Constitutional:  No weight loss, night sweats, Fevers, chills, fatigue.  Head&Eyes: No headache.  No vision loss.  No eye pain or scotoma ENT:  No Difficulty swallowing,Tooth/dental problems,Sore throat,  No ear ache, post nasal drip,  Cardio-vascular:  No chest pain, Orthopnea, PND, swelling in lower extremities,  dizziness, palpitations  GI:  No  abdominal pain, nausea, vomiting, diarrhea, loss of appetite, hematochezia, melena, heartburn, indigestion, Resp:  No chest wall deformity.  Complains of coughing yellow-green sputum with shortness of breath Skin:  no rash or lesions.  GU:  no dysuria, change in color of urine, no urgency or frequency. No flank pain.  Musculoskeletal:  No joint pain or swelling. No decreased range of motion. No back pain.  Psych:  No change in mood or affect. No depression or anxiety. Neurologic: No headache, no dysesthesia, no focal weakness, no vision loss. No syncope  Physical Exam: Vitals:   12/06/18 1200 12/06/18 1230 12/06/18 1300 12/06/18 1330  BP: (!) 195/92 (!) 150/97 (!) 162/89 110/84  Pulse: 75 82 75 81  Resp: 13 13 18  (!) 25  Temp:      TempSrc:      SpO2: 98% 95% 95% 96%  Weight:      Height:       General:  A&O x 3, NAD, nontoxic, pleasant/cooperative Head/Eye: No conjunctival hemorrhage, no icterus, [place]/AT, No nystagmus ENT:  No icterus,  No thrush, good  dentition, no pharyngeal exudate Neck:  No masses, no lymphadenpathy, no bruits CV:  RRR, no rub, no gallop, no S3 Lung: Bilateral wheezing.  Bilateral scattered rales. Abdomen: soft/NT, +BS, nondistended, no peritoneal signs Ext: No cyanosis, No rashes, No petechiae, No lymphangitis, No edema Neuro: CNII-XII intact, strength 4/5 in bilateral upper and lower extremities, no dysmetria  Labs on Admission:  Basic Metabolic Panel: Recent Labs  Lab 12/06/18 1136  NA 135  K 4.0  CL 100  CO2 23  GLUCOSE 230*  BUN 17  CREATININE 0.63  CALCIUM 8.7*   Liver Function Tests: Recent Labs  Lab 12/06/18 1136  AST 32  ALT 29  ALKPHOS 64  BILITOT 0.5  PROT 7.4  ALBUMIN 4.0   No results for input(s): LIPASE, AMYLASE in the last 168 hours. No results for input(s): AMMONIA in the last 168 hours. CBC: Recent Labs  Lab 12/06/18 1136  WBC 6.3  NEUTROABS 3.6  HGB 14.7  HCT 46.0  MCV 89.7  PLT 315   Coagulation Profile: No results for input(s): INR, PROTIME in the last 168 hours. Cardiac  Enzymes: No results for input(s): CKTOTAL, CKMB, CKMBINDEX, TROPONINI in the last 168 hours. BNP: Invalid input(s): POCBNP CBG: No results for input(s): GLUCAP in the last 168 hours. Urine analysis:    Component Value Date/Time   COLORURINE YELLOW 10/09/2017 1819   APPEARANCEUR CLEAR 10/09/2017 1819   LABSPEC 1.021 10/09/2017 1819   PHURINE 5.0 10/09/2017 1819   GLUCOSEU NEGATIVE 10/09/2017 1819   HGBUR NEGATIVE 10/09/2017 1819   BILIRUBINUR NEGATIVE 10/09/2017 1819   KETONESUR NEGATIVE 10/09/2017 1819   PROTEINUR NEGATIVE 10/09/2017 1819   NITRITE NEGATIVE 10/09/2017 1819   LEUKOCYTESUR NEGATIVE 10/09/2017 1819   Sepsis Labs: @LABRCNTIP (procalcitonin:4,lacticidven:4) )No results found for this or any previous visit (from the past 240 hour(s)).   Radiological Exams on Admission: Dg Chest Portable 1 View  Result Date: 12/06/2018 CLINICAL DATA:  Shortness of breath and productive  cough for 4 days. EXAM: PORTABLE CHEST 1 VIEW COMPARISON:  Single-view of the chest 10/21/2018 and 04/01/2006. FINDINGS: Lungs are clear. Heart size is normal. No pneumothorax or pleural fluid. No acute or focal bony abnormality. IMPRESSION: No acute disease. Electronically Signed   By: 06/01/2006 M.D.   On: 12/06/2018 11:41    EKG: Independently reviewed. Sinus, nonspecific T wave change    Time spent:60 minutes Code Status:   FULL Family Communication:  No Family at bedside Disposition Plan: expect 2-3 day hospitalization Consults called: none DVT Prophylaxis: [place] Lovenox  13/09/2018, DO  [facility] Pager [phone]  If 7PM-7AM, please contact night-coverage [email or web address] Password [email or web address] 12/06/2018, 2:30 PM

## 2018-12-06 NOTE — Progress Notes (Signed)
[facility] night shift.  The patient's BP was elevated at 200/104 mmHg. Clonidine 0.1 mg po x1 ordered. He also tested positive for COVID-19 and will be transferred to [place].   Tennis Must, MD

## 2018-12-07 DIAGNOSIS — U071 COVID-19: Principal | ICD-10-CM

## 2018-12-07 LAB — CBC WITH DIFFERENTIAL/PLATELET
Abs Immature Granulocytes: 0.04 10*3/uL (ref 0.00–0.07)
Basophils Absolute: 0 10*3/uL (ref 0.0–0.1)
Basophils Relative: 0 %
Eosinophils Absolute: 0 10*3/uL (ref 0.0–0.5)
Eosinophils Relative: 0 %
HCT: 46.5 % (ref 39.0–52.0)
Hemoglobin: 14.7 g/dL (ref 13.0–17.0)
Immature Granulocytes: 0 %
Lymphocytes Relative: 9 %
Lymphs Abs: 0.8 10*3/uL (ref 0.7–4.0)
MCH: 28.1 pg (ref 26.0–34.0)
MCHC: 31.6 g/dL (ref 30.0–36.0)
MCV: 88.7 fL (ref 80.0–100.0)
Monocytes Absolute: 0.4 10*3/uL (ref 0.1–1.0)
Monocytes Relative: 4 %
Neutro Abs: 7.9 10*3/uL — ABNORMAL HIGH (ref 1.7–7.7)
Neutrophils Relative %: 87 %
Platelets: 337 10*3/uL (ref 150–400)
RBC: 5.24 MIL/uL (ref 4.22–5.81)
RDW: 13.4 % (ref 11.5–15.5)
WBC: 9.1 10*3/uL (ref 4.0–10.5)
nRBC: 0 % (ref 0.0–0.2)

## 2018-12-07 LAB — COMPREHENSIVE METABOLIC PANEL
ALT: 30 U/L (ref 0–44)
AST: 25 U/L (ref 15–41)
Albumin: 4.1 g/dL (ref 3.5–5.0)
Alkaline Phosphatase: 63 U/L (ref 38–126)
Anion gap: 11 (ref 5–15)
BUN: 17 mg/dL (ref 6–20)
CO2: 23 mmol/L (ref 22–32)
Calcium: 8.7 mg/dL — ABNORMAL LOW (ref 8.9–10.3)
Chloride: 100 mmol/L (ref 98–111)
Creatinine, Ser: 0.72 mg/dL (ref 0.61–1.24)
GFR calc Af Amer: >60 mL/min (ref >60)
GFR calc non Af Amer: >60 mL/min (ref >60)
Glucose, Bld: 288 mg/dL — ABNORMAL HIGH (ref 70–99)
Potassium: 4.3 mmol/L (ref 3.5–5.1)
Sodium: 134 mmol/L — ABNORMAL LOW (ref 135–145)
Total Bilirubin: 0.4 mg/dL (ref 0.3–1.2)
Total Protein: 7.8 g/dL (ref 6.5–8.1)

## 2018-12-07 LAB — PHOSPHORUS: Phosphorus: 3.5 mg/dL (ref 2.5–4.6)

## 2018-12-07 LAB — ABO/RH: ABO/RH(D): AB POS

## 2018-12-07 LAB — D-DIMER, QUANTITATIVE: D-Dimer, Quant: <0.27 ug/mL-FEU (ref 0.00–0.50)

## 2018-12-07 LAB — FERRITIN: Ferritin: 79 ng/mL (ref 24–336)

## 2018-12-07 LAB — HIV ANTIBODY (ROUTINE TESTING W REFLEX): HIV Screen 4th Generation wRfx: NONREACTIVE

## 2018-12-07 LAB — C-REACTIVE PROTEIN: CRP: 1 mg/dL — ABNORMAL HIGH (ref <1.0)

## 2018-12-07 LAB — MAGNESIUM: Magnesium: 2.2 mg/dL (ref 1.7–2.4)

## 2018-12-07 MED ORDER — DEXAMETHASONE 4 MG PO TABS
6.0000 mg | ORAL_TABLET | Freq: Every day | ORAL | Status: DC
  Administered 2018-12-07: 14:00:00 6 mg via ORAL
  Filled 2018-12-07: qty 2

## 2018-12-07 MED ORDER — IPRATROPIUM-ALBUTEROL 20-100 MCG/ACT IN AERS: 1.0000 | INHALATION_SPRAY | Freq: Four times a day (QID) | RESPIRATORY_TRACT | 1 refills | Status: DC

## 2018-12-07 MED ORDER — ALBUTEROL SULFATE HFA 108 (90 BASE) MCG/ACT IN AERS: 2.0000 | INHALATION_SPRAY | Freq: Four times a day (QID) | RESPIRATORY_TRACT | 0 refills | Status: DC

## 2018-12-07 MED ORDER — ALBUTEROL SULFATE HFA 108 (90 BASE) MCG/ACT IN AERS: 2.0000 | INHALATION_SPRAY | Freq: Four times a day (QID) | RESPIRATORY_TRACT | 0 refills | Status: DC | PRN

## 2018-12-07 MED ORDER — PREDNISONE 20 MG PO TABS: 60.0000 mg | ORAL_TABLET | Freq: Two times a day (BID) | ORAL | Status: DC

## 2018-12-07 MED ORDER — AZITHROMYCIN 500 MG PO TABS: 500.0000 mg | ORAL_TABLET | Freq: Every day | ORAL | 0 refills | Status: DC

## 2018-12-07 MED ORDER — AMLODIPINE BESYLATE 5 MG PO TABS
5.0000 mg | ORAL_TABLET | Freq: Every day | ORAL | Status: DC
  Administered 2018-12-07: 5 mg via ORAL
  Filled 2018-12-07: qty 1

## 2018-12-07 MED ORDER — AMLODIPINE BESYLATE 5 MG PO TABS: 5.0000 mg | ORAL_TABLET | Freq: Every day | ORAL | 1 refills | Status: DC

## 2018-12-07 MED ORDER — IPRATROPIUM-ALBUTEROL 20-100 MCG/ACT IN AERS
1.0000 | INHALATION_SPRAY | Freq: Four times a day (QID) | RESPIRATORY_TRACT | Status: DC
  Filled 2018-12-07: qty 4

## 2018-12-07 MED ORDER — IPRATROPIUM BROMIDE HFA 17 MCG/ACT IN AERS: 2.0000 | INHALATION_SPRAY | Freq: Four times a day (QID) | RESPIRATORY_TRACT | 0 refills | Status: DC

## 2018-12-07 MED ORDER — DEXAMETHASONE 6 MG PO TABS: 6.0000 mg | ORAL_TABLET | Freq: Every day | ORAL | 0 refills | Status: DC

## 2018-12-07 NOTE — Progress Notes (Signed)
Nsg Discharge Note  Admit Date:  12/06/2018 Discharge date: 12/07/2018   Neta Ehlers to be D/C'd home with COVID precautions  per MD order.  AVS completed.  Copy for chart, and copy for patient signed, and dated. Patient/caregiver able to verbalize understanding.  Discharge Medication: Allergies as of 12/07/2018      Reactions   Zolmitriptan Nausea And Vomiting   Severe headaches   Asa [aspirin] Hives      Medication List    STOP taking these medications   acetaminophen 500 MG tablet Commonly known as: TYLENOL   doxycycline 100 MG capsule Commonly known as: VIBRAMYCIN   HYDROcodone-acetaminophen 5-325 MG tablet Commonly known as: NORCO/VICODIN   ondansetron 4 MG tablet Commonly known as: ZOFRAN   predniSONE 20 MG tablet Commonly known as: DELTASONE   promethazine-dextromethorphan 6.25-15 MG/5ML syrup Commonly known as: PROMETHAZINE-DM     TAKE these medications   albuterol 108 (90 Base) MCG/ACT inhaler Commonly known as: VENTOLIN HFA Inhale 2 puffs into the lungs every 6 (six) hours. What changed:   how much to take  when to take this  reasons to take this   ALPRAZolam 0.5 MG tablet Commonly known as: XANAX Take 0.5 mg by mouth 2 (two) times daily as needed for anxiety.   amLODipine 5 MG tablet Commonly known as: NORVASC Take 1 tablet (5 mg total) by mouth daily. Notes to patient: Very important to take your BP meds to prevent any strokes or heart attacks!   azithromycin 500 MG tablet Commonly known as: ZITHROMAX Take 1 tablet (500 mg total) by mouth daily.   dexamethasone 6 MG tablet Commonly known as: DECADRON Take 1 tablet (6 mg total) by mouth daily.   ipratropium 17 MCG/ACT inhaler Commonly known as: ATROVENT HFA Inhale 2 puffs into the lungs every 6 (six) hours.   ipratropium-albuterol 0.5-2.5 (3) MG/3ML Soln Commonly known as: DUONEB Inhale 3 mLs into the lungs 4 (four) times daily as needed. What changed: Another medication with the  same name was added. Make sure you understand how and when to take each.   Ipratropium-Albuterol 20-100 MCG/ACT Aers respimat Commonly known as: COMBIVENT Inhale 1 puff into the lungs every 6 (six) hours. What changed: You were already taking a medication with the same name, and this prescription was added. Make sure you understand how and when to take each.   Symbicort 160-4.5 MCG/ACT inhaler Generic drug: budesonide-formoterol Inhale 2 puffs into the lungs 2 (two) times daily.       Discharge Assessment: Vitals:   12/07/18 0532 12/07/18 1147  BP: (!) 165/94 (!) 181/103  Pulse: 79 91  Resp: 20 (!) 22  Temp: 98.6 F (37 C)   SpO2: 95% 98%   Skin clean, dry and intact without evidence of skin break down, no evidence of skin tears noted. IV catheter discontinued intact. Site without signs and symptoms of complications - no redness or edema noted at insertion site, patient denies c/o pain - only slight tenderness at site.  Dressing with slight pressure applied.  D/c Instructions-Education: Discharge instructions given to patient/family with verbalized understanding. D/c education completed with patient/family including follow up instructions, medication list, d/c activities limitations if indicated, with other d/c instructions as indicated by MD - patient able to verbalize understanding, all questions fully answered. Patient instructed to return to ED, call 911, or call MD for any changes in condition.  Patient escorted via [place], and D/C home via private auto.  Carney Corners, RN 12/07/2018 2:13  PM

## 2018-12-07 NOTE — Discharge Summary (Addendum)
Physician Discharge Summary  Robert Rich UJW:[id number] DOB: [birth year] DOA: 12/06/2018  PCP: Rebecka ApleyHemberg, Katherine V, NP  Admit date: 12/06/2018 Discharge date: 12/07/2018  Admitted From: Home Disposition:  Home  Recommendations for Outpatient Follow-up:  1. Follow up with PCP in 1-2 weeks 2. Please obtain BMP/CBC in one week    Discharge Condition: Stable CODE STATUS: FULL Diet recommendation: Heart Healthy    Brief/Interim Summary: 53 y.o. male with medical history of COPD, coronary disease, stroke presenting with 1 week history of shortness of breath and coughing with yellow-green sputum.  The patient recently was seen in the emergency department on 10/21/2018 for COPD exacerbation.  He was placed on doxycycline and prednisone taper at that time.  He had some improvement for period of time, but has had increasing shortness of breath for the past week.  He has continued to smoke up to 3 days prior to this admission.  In addition, the patient spouse also smokes in the house.  He denies any fevers, chills, headache, neck pain, chest pain, nausea, vomiting, diarrhea, abdominal pain, hematochezia, melena.  He has had some blood-tinged sputum in the past 24 hours. In the emergency department, the patient was afebrile hemodynamically stable saturating 85% room air.  BMP, LFTs, and CBC were unremarkable.  Initial troponin was 3.  BNP was 35.  Chest x-ray was negative for any acute changes.  EKG shows sinus rhythm with nonspecific T wave changes.  His COVID-19 test came back positive.  He was placed in appropriate isolation.  Discharge Diagnoses:  COPD exacerbation -Secondary to COVID-19 disease -Significant improvement in 24 hours with IV Solu-Medrol and metered-dose inhalers -Stable on room air throughout the hospitalization -On 12/07/2018, the patient wanted to go home -As his inflammatory markers including ferritin, CRP, D-dimer, fibrinogen had essentially normalized, and the patient had  significant clinical improvement he was felt to be stable for discharge home -Ambulatory pulse ox was obtained and checked on room air--> patient's oxygen saturation remained 97-98% on room air during and after ambulation--patient did not exhibit any respiratory distress or increased work of breathing -Discharge home with dexamethasone 6 mg daily x9 more days -Discharge home with albuterol and Atrovent metered-dose inhalers -I discussed importance of isolation and quarantine with the patient for the next 10 days -I also told the patient that his close contacts including his spouse need also to be in quarantine -Patient was not able to afford Combivent Respimat--separate albuterol and Atrovent inhalers were prescribed instead  Tobacco abuse -Tobacco cessation discussed -I have discussed tobacco cessation with the patient.  I have counseled the patient regarding the negative impacts of continued tobacco use including but not limited to lung cancer, COPD, and cardiovascular disease.  I have discussed alternatives to tobacco and modalities that may help facilitate tobacco cessation including but not limited to biofeedback, hypnosis, and medications.  Total time spent with tobacco counseling was 4 minutes.   Coronary artery disease -No chest pain presently -EKG with sinus rhythm and nonspecific T wave changes  Anxiety -Continue as needed Xanax  Essential hypertension -Patient has quit taking medications for the 6 months -Start amlodipine 5 mg daily -likely needs continued up titration -Follow-up with PCP      Discharge Instructions   Allergies as of 12/07/2018      Reactions   Zolmitriptan Nausea And Vomiting   Severe headaches   Asa [aspirin] Hives      Medication List    STOP taking these medications   acetaminophen 500 MG  tablet Commonly known as: TYLENOL   doxycycline 100 MG capsule Commonly known as: VIBRAMYCIN   HYDROcodone-acetaminophen 5-325 MG tablet Commonly  known as: NORCO/VICODIN   ondansetron 4 MG tablet Commonly known as: ZOFRAN   predniSONE 20 MG tablet Commonly known as: DELTASONE   promethazine-dextromethorphan 6.25-15 MG/5ML syrup Commonly known as: PROMETHAZINE-DM     TAKE these medications   albuterol 108 (90 Base) MCG/ACT inhaler Commonly known as: VENTOLIN HFA Inhale 2 puffs into the lungs every 6 (six) hours. What changed:   how much to take  when to take this  reasons to take this   ALPRAZolam 0.5 MG tablet Commonly known as: XANAX Take 0.5 mg by mouth 2 (two) times daily as needed for anxiety.   amLODipine 5 MG tablet Commonly known as: NORVASC Take 1 tablet (5 mg total) by mouth daily. Notes to patient: Very important to take your BP meds to prevent any strokes or heart attacks!   azithromycin 500 MG tablet Commonly known as: ZITHROMAX Take 1 tablet (500 mg total) by mouth daily.   dexamethasone 6 MG tablet Commonly known as: DECADRON Take 1 tablet (6 mg total) by mouth daily.   ipratropium 17 MCG/ACT inhaler Commonly known as: ATROVENT HFA Inhale 2 puffs into the lungs every 6 (six) hours.   ipratropium-albuterol 0.5-2.5 (3) MG/3ML Soln Commonly known as: DUONEB Inhale 3 mLs into the lungs 4 (four) times daily as needed. What changed: Another medication with the same name was added. Make sure you understand how and when to take each.   Ipratropium-Albuterol 20-100 MCG/ACT Aers respimat Commonly known as: COMBIVENT Inhale 1 puff into the lungs every 6 (six) hours. What changed: You were already taking a medication with the same name, and this prescription was added. Make sure you understand how and when to take each.   Symbicort 160-4.5 MCG/ACT inhaler Generic drug: budesonide-formoterol Inhale 2 puffs into the lungs 2 (two) times daily.       Allergies  Allergen Reactions  . Zolmitriptan Nausea And Vomiting    Severe headaches  . Asa [Aspirin] Hives    Consultations:  none    Procedures/Studies: Dg Chest Portable 1 View  Result Date: 12/06/2018 CLINICAL DATA:  Shortness of breath and productive cough for 4 days. EXAM: PORTABLE CHEST 1 VIEW COMPARISON:  Single-view of the chest 10/21/2018 and 04/01/2006. FINDINGS: Lungs are clear. Heart size is normal. No pneumothorax or pleural fluid. No acute or focal bony abnormality. IMPRESSION: No acute disease. Electronically Signed   By: Inge Rise M.D.   On: 12/06/2018 11:41        Discharge Exam: Vitals:   12/07/18 0532 12/07/18 1147  BP: (!) 165/94 (!) 181/103  Pulse: 79 91  Resp: 20 (!) 22  Temp: 98.6 F (37 C)   SpO2: 95% 98%   Vitals:   12/06/18 2115 12/06/18 2321 12/07/18 0532 12/07/18 1147  BP: (!) 200/104 (!) 173/95 (!) 165/94 (!) 181/103  Pulse:  89 79 91  Resp:   20 (!) 22  Temp:   98.6 F (37 C)   TempSrc:   Oral   SpO2:  97% 95% 98%  Weight:      Height:        General: Pt is alert, awake, not in acute distress Cardiovascular: RRR, S1/S2 +, no rubs, no gallops Respiratory: CTA bilaterally, no wheezing, no rhonchi Abdominal: Soft, NT, ND, bowel sounds + Extremities: no edema, no cyanosis   The results of significant diagnostics from this hospitalization (  including imaging, microbiology, ancillary and laboratory) are listed below for reference.    Significant Diagnostic Studies: Dg Chest Portable 1 View  Result Date: 12/06/2018 CLINICAL DATA:  Shortness of breath and productive cough for 4 days. EXAM: PORTABLE CHEST 1 VIEW COMPARISON:  Single-view of the chest 10/21/2018 and 04/01/2006. FINDINGS: Lungs are clear. Heart size is normal. No pneumothorax or pleural fluid. No acute or focal bony abnormality. IMPRESSION: No acute disease. Electronically Signed   By: Drusilla Kanner M.D.   On: 12/06/2018 11:41     Microbiology: Recent Results (from the past 240 hour(s))  SARS CORONAVIRUS 2 (Phineas Mcenroe 6-24 HRS) Nasopharyngeal Nasopharyngeal Swab     Status: Abnormal   Collection Time:  12/06/18 11:14 AM   Specimen: Nasopharyngeal Swab  Result Value Ref Range Status   SARS Coronavirus 2 POSITIVE (A) NEGATIVE Final    Comment: RESULT CALLED TO, READ BACK BY AND VERIFIED WITH: C.THOMAS RN 2143 12/06/2018 MCCORMICK K (NOTE) SARS-CoV-2 target nucleic acids are DETECTED. The SARS-CoV-2 RNA is generally detectable in upper and lower respiratory specimens during the acute phase of infection. Positive results are indicative of active infection with SARS-CoV-2. Clinical  correlation with patient history and other diagnostic information is necessary to determine patient infection status. Positive results do  not rule out bacterial infection or co-infection with other viruses. The expected result is Negative. Fact Sheet for Patients: HairSlick.no Fact Sheet for Healthcare Providers: quierodirigir.com This test is not yet approved or cleared by the [place] FDA and  has been authorized for detection and/or diagnosis of SARS-CoV-2 by FDA under an Emergency Use Authorization (EUA). This EUA will remain  in effect (meaning this test can be used) f or the duration of the COVID-19 declaration under Section 564(b)(1) of the Act, 21 U.S.C. section 360bbb-3(b)(1), unless the authorization is terminated or revoked sooner. Performed at [facility], [address] [place], [place] [address]   Respiratory Panel by PCR     Status: None   Collection Time: 12/06/18  6:11 PM   Specimen: Nasopharyngeal Swab; Respiratory  Result Value Ref Range Status   Adenovirus NOT DETECTED NOT DETECTED Final   Coronavirus 229E NOT DETECTED NOT DETECTED Final    Comment: (NOTE) The Coronavirus on the Respiratory Panel, DOES NOT test for the novel  Coronavirus (2019 nCoV)    Coronavirus HKU1 NOT DETECTED NOT DETECTED Final   Coronavirus NL63 NOT DETECTED NOT DETECTED Final   Coronavirus OC43 NOT DETECTED NOT DETECTED Final    Metapneumovirus NOT DETECTED NOT DETECTED Final   Rhinovirus / Enterovirus NOT DETECTED NOT DETECTED Final   Influenza A NOT DETECTED NOT DETECTED Final   Influenza B NOT DETECTED NOT DETECTED Final   Parainfluenza Virus 1 NOT DETECTED NOT DETECTED Final   Parainfluenza Virus 2 NOT DETECTED NOT DETECTED Final   Parainfluenza Virus 3 NOT DETECTED NOT DETECTED Final   Parainfluenza Virus 4 NOT DETECTED NOT DETECTED Final   Respiratory Syncytial Virus NOT DETECTED NOT DETECTED Final   Bordetella pertussis NOT DETECTED NOT DETECTED Final   Chlamydophila pneumoniae NOT DETECTED NOT DETECTED Final   Mycoplasma pneumoniae NOT DETECTED NOT DETECTED Final    Comment: Performed at [facility], [address] [place], [place] [address]     Labs: Basic Metabolic Panel: Recent Labs  Lab 12/06/18 1136 12/07/18 0601  NA 135 134*  K 4.0 4.3  CL 100 100  CO2 23 23  GLUCOSE 230* 288*  BUN 17 17  CREATININE  0.63 0.72  CALCIUM 8.7* 8.7*  MG  --  2.2  PHOS  --  3.5   Liver Function Tests: Recent Labs  Lab 12/06/18 1136 12/07/18 0601  AST 32 25  ALT 29 30  ALKPHOS 64 63  BILITOT 0.5 0.4  PROT 7.4 7.8  ALBUMIN 4.0 4.1   No results for input(s): LIPASE, AMYLASE in the last 168 hours. No results for input(s): AMMONIA in the last 168 hours. CBC: Recent Labs  Lab 12/06/18 1136 12/07/18 0601  WBC 6.3 9.1  NEUTROABS 3.6 7.9*  HGB 14.7 14.7  HCT 46.0 46.5  MCV 89.7 88.7  PLT 315 337   Cardiac Enzymes: No results for input(s): CKTOTAL, CKMB, CKMBINDEX, TROPONINI in the last 168 hours. BNP: Invalid input(s): POCBNP CBG: No results for input(s): GLUCAP in the last 168 hours.  Time coordinating discharge:  36 minutes  Signed:  [name], DO [facility] Pager: [phone] 12/07/2018, 1:42 PM

## 2019-01-10 ENCOUNTER — Other Ambulatory Visit: Payer: Self-pay

## 2019-01-10 ENCOUNTER — Encounter (HOSPITAL_COMMUNITY): Payer: Self-pay

## 2019-01-10 ENCOUNTER — Emergency Department (HOSPITAL_COMMUNITY)
Admission: EM | Admit: 2019-01-10 | Discharge: 2019-01-10 | Disposition: A | Discharge status: Home / Self Care | Payer: Self-pay | Attending: Emergency Medicine | Admitting: Emergency Medicine

## 2019-01-10 ENCOUNTER — Emergency Department (HOSPITAL_COMMUNITY): Payer: Self-pay

## 2019-01-10 DIAGNOSIS — F17210 Nicotine dependence, cigarettes, uncomplicated: Secondary | ICD-10-CM | POA: Insufficient documentation

## 2019-01-10 DIAGNOSIS — I2510 Atherosclerotic heart disease of native coronary artery without angina pectoris: Secondary | ICD-10-CM | POA: Insufficient documentation

## 2019-01-10 DIAGNOSIS — Z79899 Other long term (current) drug therapy: Secondary | ICD-10-CM | POA: Insufficient documentation

## 2019-01-10 DIAGNOSIS — J441 Chronic obstructive pulmonary disease with (acute) exacerbation: Secondary | ICD-10-CM | POA: Insufficient documentation

## 2019-01-10 LAB — CBC
HCT: 42.2 % (ref 39.0–52.0)
Hemoglobin: 13.5 g/dL (ref 13.0–17.0)
MCH: 28.8 pg (ref 26.0–34.0)
MCHC: 32 g/dL (ref 30.0–36.0)
MCV: 90.2 fL (ref 80.0–100.0)
Platelets: 411 10*3/uL — ABNORMAL HIGH (ref 150–400)
RBC: 4.68 MIL/uL (ref 4.22–5.81)
RDW: 13.7 % (ref 11.5–15.5)
WBC: 10.6 10*3/uL — ABNORMAL HIGH (ref 4.0–10.5)
nRBC: 0 % (ref 0.0–0.2)

## 2019-01-10 LAB — BASIC METABOLIC PANEL
Anion gap: 12 (ref 5–15)
BUN: 13 mg/dL (ref 6–20)
CO2: 26 mmol/L (ref 22–32)
Calcium: 8.9 mg/dL (ref 8.9–10.3)
Chloride: 102 mmol/L (ref 98–111)
Creatinine, Ser: 0.47 mg/dL — ABNORMAL LOW (ref 0.61–1.24)
GFR calc Af Amer: >60 mL/min (ref >60)
GFR calc non Af Amer: >60 mL/min (ref >60)
Glucose, Bld: 196 mg/dL — ABNORMAL HIGH (ref 70–99)
Potassium: 3.8 mmol/L (ref 3.5–5.1)
Sodium: 140 mmol/L (ref 135–145)

## 2019-01-10 LAB — TROPONIN I (HIGH SENSITIVITY)
Troponin I (High Sensitivity): 5 ng/L (ref <18)
Troponin I (High Sensitivity): 4 ng/L (ref <18)

## 2019-01-10 MED ORDER — IPRATROPIUM BROMIDE 0.02 % IN SOLN
0.5000 mg | Freq: Once | RESPIRATORY_TRACT | Status: AC
  Administered 2019-01-10: 0.5 mg via RESPIRATORY_TRACT
  Filled 2019-01-10: qty 2.5

## 2019-01-10 MED ORDER — ALBUTEROL (5 MG/ML) CONTINUOUS INHALATION SOLN
10.0000 mg/h | INHALATION_SOLUTION | Freq: Once | RESPIRATORY_TRACT | Status: AC
  Administered 2019-01-10: 10 mg/h via RESPIRATORY_TRACT
  Filled 2019-01-10: qty 20

## 2019-01-10 MED ORDER — ALBUTEROL SULFATE HFA 108 (90 BASE) MCG/ACT IN AERS
4.0000 | INHALATION_SPRAY | Freq: Once | RESPIRATORY_TRACT | Status: AC
  Administered 2019-01-10: 4 via RESPIRATORY_TRACT
  Filled 2019-01-10: qty 6.7

## 2019-01-10 MED ORDER — ACETAMINOPHEN 325 MG PO TABS: 650.0000 mg | ORAL_TABLET | Freq: Once | ORAL | Status: DC

## 2019-01-10 MED ORDER — PREDNISONE 50 MG PO TABS: 50.0000 mg | ORAL_TABLET | Freq: Every day | ORAL | 0 refills | Status: DC

## 2019-01-10 NOTE — ED Notes (Signed)
Pt refuses COVID Screening ED MD notified.

## 2019-01-10 NOTE — ED Provider Notes (Signed)
[facility] EMERGENCY DEPARTMENT Provider Note   CSN: [id number] Arrival date & time: 01/10/19  6213     History Chief Complaint  Patient presents with  . Shortness of Breath    Robert Rich is a 53 y.o. male.  HPI   Patient presents to the emergency room for evaluation of shortness of breath.  Patient has a history of COPD.  Patient also was diagnosed with Covid 19 on November 9.  Patient states he felt like he recovered from the COVID-19 diagnosis. However, in the last few days he has become increasingly short of breath.  Patient states he has been coughing.  He has a bad headache when he coughs.  He has had some intermittent chest pains.  He denies any fevers.  Any activity is making him feel more and more short of breath.  He has been using his albuterol inhaler excessively and is not helping.  He is feeling lightheaded and felt like he was going to pass out this morning.  EMS was called and they noted his oxygen saturation was in the low 90s.  He was given Solu-Medrol and albuterol.  Pt Stopped smoking 3 days ago. Past Medical History:  Diagnosis Date  . COPD (chronic obstructive pulmonary disease) ([place])   . Coronary artery disease   . Heart attack ([place])   . Stroke [facility])     Patient Active Problem List   Diagnosis Date Noted  . 2019 novel coronavirus disease (COVID-19) 12/07/2018  . COPD exacerbation ([place]) 12/06/2018  . Tobacco abuse 12/06/2018    Past Surgical History:  Procedure Laterality Date  . NO PAST SURGERIES         Family History  Problem Relation Age of Onset  . Kidney disease Father     Social History   Tobacco Use  . Smoking status: Current Some Day Smoker    Packs/day: 0.10  . Smokeless tobacco: Never Used  Substance Use Topics  . Alcohol use: Yes    Alcohol/week: 12.0 standard drinks    Types: 12 Cans of beer per week    Comment: Weekly  . Drug use: Not Currently    Home Medications Prior to Admission medications   Medication Sig  Start Date End Date Taking? Authorizing Provider  acetaminophen (TYLENOL) 500 MG tablet Take 1,000 mg by mouth every 6 (six) hours as needed.   Yes [provider]  albuterol (VENTOLIN HFA) 108 (90 Base) MCG/ACT inhaler Inhale 2 puffs into the lungs every 6 (six) hours. 12/07/18  Yes [name], Shanon Brow, MD  ALPRAZolam Duanne Moron) 0.5 MG tablet Take 0.5 mg by mouth 2 (two) times daily as needed for anxiety.  01/14/18  Yes [provider]  amLODipine (NORVASC) 5 MG tablet Take 1 tablet (5 mg total) by mouth daily. 12/07/18  Yes [name], Shanon Brow, MD  budesonide-formoterol [facility]) 160-4.5 MCG/ACT inhaler Inhale 2 puffs into the lungs 2 (two) times daily. 05/31/18  Yes [provider]  ipratropium (ATROVENT HFA) 17 MCG/ACT inhaler Inhale 2 puffs into the lungs every 6 (six) hours. 12/07/18  Yes [name], Shanon Brow, MD  Ipratropium-Albuterol (COMBIVENT) 20-100 MCG/ACT AERS respimat Inhale 1 puff into the lungs every 6 (six) hours. 12/07/18  Yes [name], Shanon Brow, MD  ipratropium-albuterol (DUONEB) 0.5-2.5 (3) MG/3ML SOLN Inhale 3 mLs into the lungs 4 (four) times daily as needed. 04/20/18  Yes [provider]  azithromycin (ZITHROMAX) 500 MG tablet Take 1 tablet (500 mg total) by mouth daily. Patient not taking: Reported on 01/10/2019 12/07/18  Catarina Hartshornat, David, MD  dexamethasone (DECADRON) 6 MG tablet Take 1 tablet (6 mg total) by mouth daily. Patient not taking: Reported on 01/10/2019 12/07/18   Catarina Hartshornat, David, MD  predniSONE (DELTASONE) 50 MG tablet Take 1 tablet (50 mg total) by mouth daily. 01/10/19   Linwood DibblesKnapp, Rocquel Askren, MD    Allergies    Zolmitriptan and Asa [aspirin]  Review of Systems   Review of Systems  All other systems reviewed and are negative.   Physical Exam Updated Vital Signs BP 136/90   Pulse 96   Temp 97.7 F (36.5 C) (Oral)   Resp 17   Ht 1.829 m (6')   Wt 88.5 kg   SpO2 98%   BMI 26.46 kg/m   Physical Exam Vitals and nursing note reviewed.  Constitutional:      General: He  is not in acute distress.    Appearance: He is well-developed.  HENT:     Head: Normocephalic and atraumatic.     Right Ear: External ear normal.     Left Ear: External ear normal.  Eyes:     General: No scleral icterus.       Right eye: No discharge.        Left eye: No discharge.     Conjunctiva/sclera: Conjunctivae normal.  Neck:     Trachea: No tracheal deviation.  Cardiovascular:     Rate and Rhythm: Normal rate and regular rhythm.  Pulmonary:     Effort: Pulmonary effort is normal. No respiratory distress.     Breath sounds: No stridor. Wheezing present. No rales.  Abdominal:     General: Bowel sounds are normal. There is no distension.     Palpations: Abdomen is soft.     Tenderness: There is no abdominal tenderness. There is no guarding or rebound.  Musculoskeletal:        General: No tenderness.     Cervical back: Neck supple.  Skin:    General: Skin is warm and dry.     Findings: No rash.  Neurological:     Mental Status: He is alert.     Cranial Nerves: No cranial nerve deficit (no facial droop, extraocular movements intact, no slurred speech).     Sensory: No sensory deficit.     Motor: No abnormal muscle tone or seizure activity.     Coordination: Coordination normal.     ED Results / Procedures / Treatments   Labs (all labs ordered are listed, but only abnormal results are displayed) Labs Reviewed  CBC - Abnormal; Notable for the following components:      Result Value   WBC 10.6 (*)    Platelets 411 (*)    All other components within normal limits  BASIC METABOLIC PANEL - Abnormal; Notable for the following components:   Glucose, Bld 196 (*)    Creatinine, Ser 0.47 (*)    All other components within normal limits  SARS CORONAVIRUS 2 ([name] 6-24 HRS)  TROPONIN I (HIGH SENSITIVITY)  TROPONIN I (HIGH SENSITIVITY)    EKG EKG Interpretation  Date/Time:  Monday January 10 2019 12:12:22 EST Ventricular Rate:  90 PR Interval:    QRS Duration: 91 QT  Interval:  370 QTC Calculation: 453 R Axis:   46 Text Interpretation: Sinus rhythm Probable anteroseptal infarct, old No significant change since last tracing Confirmed by Linwood DibblesKnapp, Kailei Cowens [phone]([id number]) on 01/10/2019 12:18:00 PM   Radiology DG Chest Portable 1 View  Result Date: 01/10/2019 CLINICAL DATA:  Shortness of breath history of  COVID positive test 2 weeks ago. EXAM: PORTABLE CHEST 1 VIEW COMPARISON:  12/06/2018 FINDINGS: The cardiac silhouette, mediastinal and hilar contours are within normal limits and stable. The lungs are clear of an acute process. No worrisome pulmonary lesions or acute pulmonary infiltrates. No pleural effusions. The bony thorax is intact. IMPRESSION: No acute cardiopulmonary findings. No change since prior chest film. Electronically Signed   By: Rudie Meyer M.D.   On: 01/10/2019 12:25    Procedures Procedures (including critical care time)  Medications Ordered in ED Medications  acetaminophen (TYLENOL) tablet 650 mg (650 mg Oral Refused 01/10/19 1127)  albuterol (VENTOLIN HFA) 108 (90 Base) MCG/ACT inhaler 4 puff (4 puffs Inhalation Provided for home use 01/10/19 1323)  albuterol (PROVENTIL,VENTOLIN) solution continuous neb (10 mg/hr Nebulization Given 01/10/19 1236)  ipratropium (ATROVENT) nebulizer solution 0.5 mg (0.5 mg Nebulization Given 01/10/19 1236)    ED Course  I have reviewed the triage vital signs and the nursing notes.  Pertinent labs & imaging results that were available during my care of the patient were reviewed by me and considered in my medical decision making (see chart for details).  Clinical Course as of Jan 09 1446  Mon Jan 10, 2019  1323 Initial laboratory tests are unremarkable.  First troponin is normal.   [JK]  1323 Chest x-ray without acute findings.   [JK]  1442 On repeat exam the patient is no longer wheezing.  He is breathing easily.  At the bedside occasionally his oxygen saturation has dropped into the high 80s but goes back up  into the low 90s with deep breathing.   [JK]  1443 I discussed bringing the patient into the hospital because of his borderline oxygen level.  Patient states he is feeling much better.  He is concerned about the Covid infection in the hospital and would prefer to try to manage this at home.  He understands he can return if he feels worse.  He states normally as long as he gets started on steroids he starts feeling better.   [JK]    Clinical Course User Index [JK] [name], MD   MDM Rules/Calculators/A&P                      Patient presented with complaints of shortness of breath.  Patient had recent Covid infection but has felt that those symptoms resolved in the last couple of weeks.  Patient continued to smoke until 3 days ago.  On initial exam he had significant wheezing.  Patient denied any fevers or chills.  Symptoms most likely related to COPD exacerbation not persistent Covid symptoms.  Patient responded to breathing treatments and steroids.  He is feeling much better.  He did have borderline oxygen saturation that did improve with deep breathing.  I discussed the option of coming in for observation but the patient felt comfortable going home.  He understood to return if he had worsening issues with his breathing. Final Clinical Impression(s) / ED Diagnoses Final diagnoses:  COPD exacerbation (HCC)    Rx / DC Orders ED Discharge Orders         Ordered    predniSONE (DELTASONE) 50 MG tablet  Daily     01/10/19 1443           [name], MD 01/10/19 1447

## 2019-01-10 NOTE — Discharge Instructions (Signed)
Continue the steroids as prescribed.  Continue your albuterol inhaler.  Follow-up with your doctor to make sure your symptoms have resolved.  Return to the ED for any worsening or recurrent shortness of breath.

## 2019-01-10 NOTE — ED Triage Notes (Signed)
Ems reports pt has history of copd and diagnosed with covid over 2 weeks ago.  Reports sob for the past few days.  Has been using home nebs without relief.  EMS gave pt 4 albuterol nebs and 125mg  solumedrol IV pta.   Reports o2 sat low 90s.

## 2019-01-10 NOTE — ED Notes (Signed)
Pt reports had sudden onset of dizziness, diaphoresis, and chest felt "funny."  Repeated ekg.  BP 158/90.  o2 sat 96% on 2 liters.  Primary RN notified and will tell edp.

## 2019-01-10 NOTE — ED Notes (Signed)
AVS reviewed with pt with no further questions.  Pt PIV removed x2 with hemostasis.  Pt left via wheelchair to his wife's car in the pt pickup area.

## 2019-01-10 NOTE — Progress Notes (Signed)
**Note De-Identified Robert Rich Obfuscation** RT assessment: patient said he feels better after EMS gave him steroids.  SAT 96% on 3 L [place].  RRT to continue to monitor.

## 2019-01-10 NOTE — ED Notes (Addendum)
Dr Tomi Bamberger notified of pt dizziness/diphoresis and VS with copy of EKG given.  MD would like for pt to receive breathing treatment by RRT.  RRT Abigail Butts notified.

## 2019-03-17 ENCOUNTER — Inpatient Hospital Stay (HOSPITAL_COMMUNITY)
Admission: EM | Admit: 2019-03-17 | Discharge: 2019-03-20 | DRG: 917 | Disposition: A | Discharge status: Home / Self Care | Payer: Self-pay | Attending: Family Medicine | Admitting: Family Medicine

## 2019-03-17 ENCOUNTER — Other Ambulatory Visit: Payer: Self-pay

## 2019-03-17 ENCOUNTER — Emergency Department (HOSPITAL_COMMUNITY): Payer: Self-pay

## 2019-03-17 ENCOUNTER — Encounter (HOSPITAL_COMMUNITY): Payer: Self-pay | Admitting: *Deleted

## 2019-03-17 DIAGNOSIS — I25110 Atherosclerotic heart disease of native coronary artery with unstable angina pectoris: Secondary | ICD-10-CM | POA: Diagnosis present

## 2019-03-17 DIAGNOSIS — I5031 Acute diastolic (congestive) heart failure: Secondary | ICD-10-CM | POA: Diagnosis present

## 2019-03-17 DIAGNOSIS — Z886 Allergy status to analgesic agent status: Secondary | ICD-10-CM

## 2019-03-17 DIAGNOSIS — Z6828 Body mass index (BMI) 28.0-28.9, adult: Secondary | ICD-10-CM

## 2019-03-17 DIAGNOSIS — T510X1A Toxic effect of ethanol, accidental (unintentional), initial encounter: Secondary | ICD-10-CM | POA: Diagnosis present

## 2019-03-17 DIAGNOSIS — E669 Obesity, unspecified: Secondary | ICD-10-CM | POA: Diagnosis present

## 2019-03-17 DIAGNOSIS — F1390 Sedative, hypnotic, or anxiolytic use, unspecified, uncomplicated: Secondary | ICD-10-CM | POA: Diagnosis present

## 2019-03-17 DIAGNOSIS — Z7951 Long term (current) use of inhaled steroids: Secondary | ICD-10-CM

## 2019-03-17 DIAGNOSIS — F17210 Nicotine dependence, cigarettes, uncomplicated: Secondary | ICD-10-CM | POA: Diagnosis present

## 2019-03-17 DIAGNOSIS — E782 Mixed hyperlipidemia: Secondary | ICD-10-CM | POA: Diagnosis present

## 2019-03-17 DIAGNOSIS — Y92009 Unspecified place in unspecified non-institutional (private) residence as the place of occurrence of the external cause: Secondary | ICD-10-CM

## 2019-03-17 DIAGNOSIS — I10 Essential (primary) hypertension: Secondary | ICD-10-CM | POA: Diagnosis present

## 2019-03-17 DIAGNOSIS — I252 Old myocardial infarction: Secondary | ICD-10-CM

## 2019-03-17 DIAGNOSIS — I469 Cardiac arrest, cause unspecified: Secondary | ICD-10-CM | POA: Diagnosis present

## 2019-03-17 DIAGNOSIS — I468 Cardiac arrest due to other underlying condition: Secondary | ICD-10-CM | POA: Diagnosis present

## 2019-03-17 DIAGNOSIS — J9601 Acute respiratory failure with hypoxia: Secondary | ICD-10-CM | POA: Diagnosis present

## 2019-03-17 DIAGNOSIS — J9801 Acute bronchospasm: Secondary | ICD-10-CM | POA: Diagnosis not present

## 2019-03-17 DIAGNOSIS — Z79899 Other long term (current) drug therapy: Secondary | ICD-10-CM

## 2019-03-17 DIAGNOSIS — Z8616 Personal history of COVID-19: Secondary | ICD-10-CM

## 2019-03-17 DIAGNOSIS — T424X1A Poisoning by benzodiazepines, accidental (unintentional), initial encounter: Principal | ICD-10-CM | POA: Diagnosis present

## 2019-03-17 DIAGNOSIS — E1369 Other specified diabetes mellitus with other specified complication: Secondary | ICD-10-CM | POA: Diagnosis present

## 2019-03-17 DIAGNOSIS — Z8673 Personal history of transient ischemic attack (TIA), and cerebral infarction without residual deficits: Secondary | ICD-10-CM

## 2019-03-17 DIAGNOSIS — I4901 Ventricular fibrillation: Secondary | ICD-10-CM | POA: Diagnosis present

## 2019-03-17 DIAGNOSIS — J441 Chronic obstructive pulmonary disease with (acute) exacerbation: Secondary | ICD-10-CM | POA: Diagnosis present

## 2019-03-17 DIAGNOSIS — F10129 Alcohol abuse with intoxication, unspecified: Secondary | ICD-10-CM | POA: Diagnosis present

## 2019-03-17 DIAGNOSIS — Z20822 Contact with and (suspected) exposure to covid-19: Secondary | ICD-10-CM | POA: Diagnosis present

## 2019-03-17 DIAGNOSIS — Z888 Allergy status to other drugs, medicaments and biological substances status: Secondary | ICD-10-CM

## 2019-03-17 HISTORY — DX: Type 2 diabetes mellitus without complications: E11.9

## 2019-03-17 HISTORY — DX: Hyperlipidemia, unspecified: E78.5

## 2019-03-17 HISTORY — DX: Essential (primary) hypertension: I10

## 2019-03-17 HISTORY — DX: Obesity, unspecified: E66.9

## 2019-03-17 LAB — TROPONIN I (HIGH SENSITIVITY)
Troponin I (High Sensitivity): 42 ng/L — ABNORMAL HIGH (ref <18)
Troponin I (High Sensitivity): 168 ng/L (ref <18)
Troponin I (High Sensitivity): 141 ng/L (ref <18)
Troponin I (High Sensitivity): 97 ng/L — ABNORMAL HIGH (ref <18)

## 2019-03-17 LAB — RAPID URINE DRUG SCREEN, HOSP PERFORMED
Amphetamines: NOT DETECTED
Barbiturates: NOT DETECTED
Benzodiazepines: POSITIVE — AB
Cocaine: NOT DETECTED
Opiates: NOT DETECTED
Tetrahydrocannabinol: NOT DETECTED

## 2019-03-17 LAB — CBC
HCT: 45.5 % (ref 39.0–52.0)
Hemoglobin: 14.6 g/dL (ref 13.0–17.0)
MCH: 29.6 pg (ref 26.0–34.0)
MCHC: 32.1 g/dL (ref 30.0–36.0)
MCV: 92.3 fL (ref 80.0–100.0)
Platelets: 380 10*3/uL (ref 150–400)
RBC: 4.93 MIL/uL (ref 4.22–5.81)
RDW: 13.4 % (ref 11.5–15.5)
WBC: 13.7 10*3/uL — ABNORMAL HIGH (ref 4.0–10.5)
nRBC: 0 % (ref 0.0–0.2)

## 2019-03-17 LAB — POCT I-STAT EG7
Acid-base deficit: 5 mmol/L — ABNORMAL HIGH (ref 0.0–2.0)
Bicarbonate: 21.9 mmol/L (ref 20.0–28.0)
Calcium, Ion: 1.04 mmol/L — ABNORMAL LOW (ref 1.15–1.40)
HCT: 46 % (ref 39.0–52.0)
Hemoglobin: 15.6 g/dL (ref 13.0–17.0)
O2 Saturation: 99 %
Potassium: 5 mmol/L (ref 3.5–5.1)
Sodium: 137 mmol/L (ref 135–145)
TCO2: 23 mmol/L (ref 22–32)
pCO2, Ven: 47.5 mmHg (ref 44.0–60.0)
pH, Ven: 7.271 (ref 7.250–7.430)
pO2, Ven: 166 mmHg — ABNORMAL HIGH (ref 32.0–45.0)

## 2019-03-17 LAB — ETHANOL: Alcohol, Ethyl (B): <10 mg/dL (ref <10)

## 2019-03-17 LAB — COMPREHENSIVE METABOLIC PANEL
ALT: 46 U/L — ABNORMAL HIGH (ref 0–44)
AST: 95 U/L — ABNORMAL HIGH (ref 15–41)
Albumin: 3.7 g/dL (ref 3.5–5.0)
Alkaline Phosphatase: 60 U/L (ref 38–126)
Anion gap: 18 — ABNORMAL HIGH (ref 5–15)
BUN: 15 mg/dL (ref 6–20)
CO2: 18 mmol/L — ABNORMAL LOW (ref 22–32)
Calcium: 8.2 mg/dL — ABNORMAL LOW (ref 8.9–10.3)
Chloride: 104 mmol/L (ref 98–111)
Creatinine, Ser: 0.97 mg/dL (ref 0.61–1.24)
GFR calc Af Amer: >60 mL/min (ref >60)
GFR calc non Af Amer: >60 mL/min (ref >60)
Glucose, Bld: 250 mg/dL — ABNORMAL HIGH (ref 70–99)
Potassium: 5.3 mmol/L — ABNORMAL HIGH (ref 3.5–5.1)
Sodium: 140 mmol/L (ref 135–145)
Total Bilirubin: 0.2 mg/dL — ABNORMAL LOW (ref 0.3–1.2)
Total Protein: 6.6 g/dL (ref 6.5–8.1)

## 2019-03-17 LAB — SARS CORONAVIRUS 2 (TAT 6-24 HRS): SARS Coronavirus 2: NEGATIVE

## 2019-03-17 LAB — MAGNESIUM: Magnesium: 2.2 mg/dL (ref 1.7–2.4)

## 2019-03-17 LAB — CK: Total CK: 535 U/L — ABNORMAL HIGH (ref 49–397)

## 2019-03-17 LAB — LACTIC ACID, PLASMA
Lactic Acid, Venous: 2.8 mmol/L (ref 0.5–1.9)
Lactic Acid, Venous: 5.3 mmol/L (ref 0.5–1.9)
Lactic Acid, Venous: 4.9 mmol/L (ref 0.5–1.9)

## 2019-03-17 LAB — POC SARS CORONAVIRUS 2 AG -  ED: SARS Coronavirus 2 Ag: NEGATIVE

## 2019-03-17 MED ORDER — THIAMINE HCL 100 MG PO TABS
100.0000 mg | ORAL_TABLET | Freq: Every day | ORAL | Status: DC
  Administered 2019-03-17: 100 mg via ORAL
  Filled 2019-03-17: qty 1

## 2019-03-17 MED ORDER — FOLIC ACID 1 MG PO TABS
1.0000 mg | ORAL_TABLET | Freq: Every day | ORAL | Status: DC
  Administered 2019-03-17 – 2019-03-20 (×3): 1 mg via ORAL
  Filled 2019-03-17 (×2): qty 1

## 2019-03-17 MED ORDER — IPRATROPIUM-ALBUTEROL 0.5-2.5 (3) MG/3ML IN SOLN
3.0000 mL | RESPIRATORY_TRACT | Status: DC | PRN
  Administered 2019-03-18 – 2019-03-19 (×2): 3 mL via RESPIRATORY_TRACT
  Filled 2019-03-17 (×2): qty 3

## 2019-03-17 MED ORDER — ACETAMINOPHEN 650 MG RE SUPP: 650.0000 mg | Freq: Four times a day (QID) | RECTAL | Status: DC | PRN

## 2019-03-17 MED ORDER — ALBUTEROL (5 MG/ML) CONTINUOUS INHALATION SOLN
INHALATION_SOLUTION | RESPIRATORY_TRACT | Status: AC
  Filled 2019-03-17: qty 20

## 2019-03-17 MED ORDER — THIAMINE HCL 100 MG/ML IJ SOLN: 100.0000 mg | Freq: Every day | INTRAMUSCULAR | Status: DC

## 2019-03-17 MED ORDER — LORAZEPAM 1 MG PO TABS
1.0000 mg | ORAL_TABLET | ORAL | Status: DC | PRN
  Administered 2019-03-17: 2 mg via ORAL
  Administered 2019-03-19 (×2): 1 mg via ORAL
  Filled 2019-03-17: qty 2
  Filled 2019-03-17: qty 4
  Filled 2019-03-17: qty 1

## 2019-03-17 MED ORDER — MORPHINE SULFATE (PF) 4 MG/ML IV SOLN
4.0000 mg | Freq: Once | INTRAVENOUS | Status: AC
  Administered 2019-03-17: 4 mg via INTRAVENOUS
  Filled 2019-03-17: qty 1

## 2019-03-17 MED ORDER — ALBUTEROL SULFATE (2.5 MG/3ML) 0.083% IN NEBU
2.5000 mg | INHALATION_SOLUTION | RESPIRATORY_TRACT | Status: DC | PRN
  Administered 2019-03-18 (×2): 2.5 mg via RESPIRATORY_TRACT
  Filled 2019-03-17: qty 3

## 2019-03-17 MED ORDER — SODIUM CHLORIDE 0.9 % IV BOLUS
1000.0000 mL | Freq: Once | INTRAVENOUS | Status: AC
  Administered 2019-03-17: 1000 mL via INTRAVENOUS

## 2019-03-17 MED ORDER — METHYLPREDNISOLONE SODIUM SUCC 125 MG IJ SOLR
125.0000 mg | Freq: Once | INTRAMUSCULAR | Status: AC
  Administered 2019-03-17: 125 mg via INTRAVENOUS
  Filled 2019-03-17: qty 2

## 2019-03-17 MED ORDER — ADULT MULTIVITAMIN W/MINERALS CH
1.0000 | ORAL_TABLET | Freq: Every day | ORAL | Status: DC
  Administered 2019-03-17 – 2019-03-20 (×3): 1 via ORAL
  Filled 2019-03-17 (×3): qty 1

## 2019-03-17 MED ORDER — ENOXAPARIN SODIUM 40 MG/0.4ML ~~LOC~~ SOLN
40.0000 mg | SUBCUTANEOUS | Status: DC
  Administered 2019-03-17: 40 mg via SUBCUTANEOUS
  Filled 2019-03-17: qty 0.4

## 2019-03-17 MED ORDER — ALBUTEROL SULFATE HFA 108 (90 BASE) MCG/ACT IN AERS: 8.0000 | INHALATION_SPRAY | Freq: Once | RESPIRATORY_TRACT | Status: DC

## 2019-03-17 MED ORDER — IOHEXOL 350 MG/ML SOLN
100.0000 mL | Freq: Once | INTRAVENOUS | Status: AC | PRN
  Administered 2019-03-17: 100 mL via INTRAVENOUS

## 2019-03-17 MED ORDER — LORAZEPAM 2 MG/ML IJ SOLN
2.0000 mg | Freq: Once | INTRAMUSCULAR | Status: AC
  Administered 2019-03-17: 2 mg via INTRAVENOUS
  Filled 2019-03-17: qty 1

## 2019-03-17 MED ORDER — ACETAMINOPHEN 325 MG PO TABS
650.0000 mg | ORAL_TABLET | Freq: Four times a day (QID) | ORAL | Status: DC | PRN
  Administered 2019-03-19 (×2): 650 mg via ORAL
  Filled 2019-03-17 (×3): qty 2

## 2019-03-17 MED ORDER — LORAZEPAM 2 MG/ML IJ SOLN
1.0000 mg | INTRAMUSCULAR | Status: DC | PRN
  Administered 2019-03-17 – 2019-03-18 (×4): 2 mg via INTRAVENOUS
  Filled 2019-03-17 (×4): qty 1

## 2019-03-17 NOTE — ED Notes (Signed)
Called staffing for sitter, no available at this time.

## 2019-03-17 NOTE — ED Notes (Signed)
Still having repetitive questions about his phone, currently disoriented to time.

## 2019-03-17 NOTE — ED Notes (Signed)
Patient Robert Rich asking if we can please have patient call her. She states she has his phone so this is his number  [phone]

## 2019-03-17 NOTE — ED Provider Notes (Signed)
Patient is awake, alert, though amnestic to the events. He endorses dyspnea for about 1 month. Still dyspneic, as well as having wheezing. However, no respiratory distress. Is on 2L. CT head, PE study are negative. He remembers being more short of breath today, probably a COPD exacerbation?. Will need to be admitted for monitoring and further workup.  He seems a little confused but this could from the arrest itself. Admit.   Pricilla Loveless, MD 03/17/19 1726

## 2019-03-17 NOTE — Progress Notes (Addendum)
FPTS Interim Progress Note  BP (!) 166/89   Pulse 96   Temp 98.1 F (36.7 C) (Axillary)   Resp (!) 25   Ht 5\' 9"  (1.753 m)   Wt 88 kg   SpO2 95%   BMI 28.65 kg/m    Evaluated patient shortly after change of shift.  Vitals remain within normal limits with telemetry monitor in sinus rhythm.  Currently denying difficulties breathing.  Endorsing chest pain only with deep breathing.  No nausea, vomiting, abdominal pain.  Exam notable for diffuse wheezing throughout but with no increased WOB and appropriately saturated on exam. Still tremulous and does appear to be confabulating and disoriented frequently asking where we are and who has his cell phone despite constant redirection although not agitated.  No focal neurologic deficits on exam.  Spoke with on-call cardiology, Dr. , who agrees event likely precipitated by respiratory arrest and plans to evaluate patient formally in the morning but will likely plan to undergo catheterization.  Otherwise plan as per H&P.   Jacinto Halim, DO 03/17/2019, 9:34 PM PGY-3, [facility] pager [phone]

## 2019-03-17 NOTE — ED Notes (Signed)
Robert Rich- Mother- [phone] (home), [phone] (cell)- would like pt updates.

## 2019-03-17 NOTE — ED Triage Notes (Signed)
Ems was call for resp distress, states patient was using his neb. Machine patient became  Blue upon ems arrival per ems patient was pulseless cpr was started by bystanders @1251with  ROSC @1257  ems started cpr 1303 p with ROSC @1306 . Io right tib/fib. patikent was given 1 epip per ems Fentanyl 100 mg Versed 5 mg and Mag 2 gm iv per ems. King airway in place Dr. at bedside upon arrival patient alert good strong pulses, King airway removed per resp. Patient is alert confused .

## 2019-03-17 NOTE — H&P (Addendum)
Family Medicine Teaching [facility] Admission History and Physical Service Pager: [phone]  Patient name: Robert Rich Medical record number: [id number] Date of birth: 06-15-1965 Age: 54 y.o. Gender: male  Primary Care Provider: Rebecka Apley, NP Consultants: Cardiology Code Status: Full Preferred Emergency Contact: Robert Rich, Mother, [phone]  Chief Complaint: cardiac arrest  Assessment and Plan: Robert Rich is a 54 y.o. male presenting with respiratory distress and brief PEA arrest. PMH is significant for COPD, coronary disease, history CVA, h/o tobacco use, COVID-19 positive in 11/2018.  Brief PEA arrest Patient found down in his home, cyanotic, with nebulizer in his mouth for an unknown period of time.  CPR began by bystander and taken over by EMS.  Patient received 1 mg epinephrine, 2 g magnesium. ROSC achieved within 2 minutes of CPR by EMS.  Patient does not remember events immediately surrounding the arrest.  He only notes that he was drinking earlier in the day and had some shortness of breath, which prompted the nebulizer use. Possible previous hx of MI and confirmed previous CVA but no other cardiovascular hx.   Age advanced atherosclerotic calcifications involving the thoracic aorta found on CTA today, but no aneurysm or dissection. Patient does not recall pain earlier in the day, but reports chest pain in the ED.  Troponin 42>>168 on admission, possibly 2/2 to chest compressions or the arrest and not an acutal MI.  No anemia noted on labs.  Electrolytes within normal limits, K 5.0.  WBC is mildly elevated to 13.7. S/p 2L NS bolus in ED. EKG grossly normal w/ no ST changes. . CTA/CXR do not show  any acute cardiopulmonary process. CT head with no acute process. Echocardiogram from 2016 revealed normal LVEF (60-65%) with mild LVH and no wall motion abnormalities. Patient also reports that he had nausea and vomiting for 2 days prior to having difficulty breathing  earlier today, denies sick contacts. Previously COVID-19 positive in 11/2018. Due to unreliable nature of history (at first could not tell me if he had vomiting, diarrhea, or both), unclear if this is an accurate history or if it was confabulation, see SUD and confusion below. No episodes of emesis or diarrhea noted in ED. Unclear if this event was triggered by respiratory distress, prolonged beta agonist exposure, alcohol/benzodiazepine intoxication, or cardiac disease that triggered an arrhythmia. Also possible that patient did not truly arrest. Patient has previous prescription for sildenafil, but according to pharmacy technologist, the only prescription obtained in many months is for benzodiazepines. Plan to obtain risk stratification labs, TSH, magnesium, BNP, CK, LA.  Consult to cardiology.  Echocardiogram ordered.   -Admit to progressive, FPTS, attending Dr. Leveda Rich  -Consult cardiology, appreciate recommendations -Continuous cardiac monitoring -Follow-up echo results -Follow-up lipid panel, A1c, TSH - trending trops -Daily CMP, CBC, CK, LA -Heart healthy diet -Lovenox for DVT prophylaxis -PT/OT eval and treat  Acute hypoxic respiratory failure  COPD Patient with reported respiratory distress leading up to reported PEA event around midday today. On exam his breathing does not appear labored and lungs exhibit only mild wheezing with good air movement, but he is requiring 2L [place]. Multiple previous ED visits/admission in the past for copd exacerbations. Uses albuterol, symbicort and a duoneb at home. Patient had [facility] airway removed and was appropriately protecting airway, satting in the low to mid 90s on 2L [place]. Patient has h/o tobacco use, but reports he quit smoking 6 months ago. COVID-19 positive in 11/2018, negative this admission. He denies cough, fever, SOB,  or other respiratory problems, CTA and CXR negative for acute respiratory process. It is possible respiratory distress was caused by  combination of intoxication with benzodiazepines, alcohol, and history of COPD +/- some element of obstructive sleep apnea (patient is overweight).  - Continuous pulse oximetry - Incentive spirometry - O2 therapy: maintain SpO2 to 88-92% via [place] - Albuterol neb q2h PRN wheezing - Douneb q4h PRN wheezing, SOB  Substance use disorder  possible alcohol withdrawal Patient reports that he had 12 beers to drink today with 5 shots of Christiane Ha before that.  However, his history varies between 12 beers every day or once a week. His blood alcohol content level is undetectable in the ED.  On admission his UDS is positive only for benzodiazepines, which he is prescribed.  Patient reported that he has not had benzodiazepines in more than a month.   Patient has history of alcohol and cocaine use.  Patient had visible tremor during exam, unclear if this is from alcohol use or from epinephrine or DuoNeb's earlier in the day.  Notably patient has multiple telangiectasias in a malar distribution.  Patient very confused at baseline, would intermittently give different answers to the same question to various healthcare team members, and would repeatedly asked the same questions regarding the past 24 hrs.  Given the lack of EtOH in his system and possible daily alcohol use along with tremulousness and psychomotor agitation there is concern for early alcohol withdrawal. Wernicke-Korsakoff encephalopathy on differential as well. .  -CIWA protocol -Thiamine lab follow up -Thiamine, folic acid, multivitamin -Consult social work for possible community resources for substance use disorder  DMT2 Glucose 250 on admission.  Patient diagnosed with diabetes in September 2015, A1c 8.8% at that time.  Seen in 2018 up to 10%, Lantus use previously was 40 units in 2018, most recent A1c 8.1% August 2020.  Patient is currently not using any insulin. Will monitor sugars with daily BMP, A1c pending.  Can consider sliding scale insulin  if indicated by the aforementioned measures. -Pending hemoglobin A1c -Daily BMP   - initiate SSI in am  Transaminitis AST/ALT 95, 46 respectively today.  Patient had elevated AST/ALT in September 2015 to 51 and 67 respectively, at which time hepatitis work-up was negative for hepatitis A, B, or C.  Concern for alcohol withdrawal on admission today, CIWA's in place, see above for polysubstance use for treatment.  -CMP pending  Hypertension Patient with long history of hypertension, most recent primary care visit noted verapamil 80 mg on medication list.  However patient wasreportedly not taking these medications or filling them at the pharmacy.  Pressure since arriving in the hospital have ranged from normotensive to mildly hypertensive.  SBP range 110-139, DBP range 70-100.  Most recent 138/92. -Continue to monitor - hold listed home meds  HLD Patient with elevated lipids since 2015 and on a statin since CVA in 2016.  Most recently taking Crestor 5 mg charted in August 2020.  However according to pharmacist tech prescription review, patient has not picked up any prescription other than Ativan. -Lipid panel pending - follow LFTs. 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  Patient previously was not on secondary prevention prior to this.  Afterwards he was on Plavix and Lipitor 80.  The time of the stroke, Alekh echocardiogram with Doppler and bubble study was obtained in October 2016 that showed no cardiac etiology for  the stroke.  FEN/GI: Heart healthy diet Prophylaxis: Lovenox  Disposition: To progressive pending medical work-up  History of Present Illness:  Robert Rich is a 54 y.o. male presenting with cardiac arrest.  Patient was experiencing severe shortness of breath and states he had been drinking heavily during the day when he developed respiratory distress and became  cyanotic.  Bystanders initiated CPR and EMS was called.  Upon EMS arrival patient was cyanotic and pulseless, reportedly in PEA.  EMS initiated CPR, patient was given 1 mg of epinephrine, 2 g magnesium, and King airway was placed with fentanyl and Versed for sedation.  After 2 minutes of CPR ROSC was achieved.  On presentation to the ED, patient was alert, awake, fighting against [facility] Pace airway, asking for it to be removed.  King airway was removed, patient is voluntarily protecting airway now, alert, talking, oriented to self, location, and year, not month, and is confused about why he is here.  He denies suicidal ideation. .   Review Of Systems: Per HPI with the following additions:   Review of Systems  Constitutional: Negative.  Negative for chills, diaphoresis, fever, malaise/fatigue and weight loss.  HENT: Negative.   Eyes: Negative.   Respiratory: Positive for shortness of breath. Negative for cough, hemoptysis, sputum production and wheezing.   Cardiovascular: Positive for chest pain. Negative for palpitations, orthopnea and leg swelling.  Gastrointestinal: Positive for diarrhea and vomiting.  Genitourinary: Negative.   Musculoskeletal: Positive for back pain.  Skin: Negative.   Neurological: Positive for tremors and speech change. Negative for dizziness, tingling, sensory change, focal weakness, seizures and headaches.  Endo/Heme/Allergies: Negative.   Psychiatric/Behavioral: Positive for memory loss and substance abuse. Negative for depression and suicidal ideas. The patient is not nervous/anxious.     Patient Active Problem List   Diagnosis Date Noted  . 2019 novel coronavirus disease (COVID-19) 12/07/2018  . COPD exacerbation ([place]) 12/06/2018  . Tobacco abuse 12/06/2018    Past Medical History: Past Medical History:  Diagnosis Date  . COPD (chronic obstructive pulmonary disease) ([place])   . Coronary artery disease   . Heart attack ([place])   . Stroke [facility])     Past Surgical  History: Past Surgical History:  Procedure Laterality Date  . NO PAST SURGERIES      Social History: Social History   Tobacco Use  . Smoking status: Current Some Day Smoker    Packs/day: 0.10  . Smokeless tobacco: Never Used  Substance Use Topics  . Alcohol use: Yes    Alcohol/week: 12.0 standard drinks    Types: 12 Cans of beer per week    Comment: Weekly  . Drug use: Not Currently   Additional social history: lives alone, recently divorced, has a fiance named Primary school teacher. Ok to talk to his mother re: medical problems Please also refer to relevant sections of EMR.  Family History: Family History  Problem Relation Age of Onset  . Kidney disease Father     Allergies and Medications: Allergies  Allergen Reactions  . Zolmitriptan Nausea And Vomiting and Other (See Comments)    Severe headaches  . Asa [Aspirin] Hives   No current facility-administered medications on file prior to encounter.   Current Outpatient Medications on File Prior to Encounter  Medication Sig Dispense Refill  . albuterol (VENTOLIN HFA) 108 (90 Base) MCG/ACT inhaler Inhale 2 puffs into the lungs every 6 (six) hours. (Patient taking differently: Inhale 2 puffs into the lungs every 6 (six) hours as needed for wheezing or  shortness of breath. ) 6.7 g 0  . ALPRAZolam (XANAX) 0.5 MG tablet Take 0.5 mg by mouth 2 (two) times daily as needed for anxiety.     . sildenafil (REVATIO) 20 MG tablet Take 20-100 mg by mouth daily as needed (FOR E.D.).     Marland Kitchen amLODipine (NORVASC) 5 MG tablet Take 1 tablet (5 mg total) by mouth daily. (Patient not taking: Reported on 03/17/2019) 30 tablet 1  . azithromycin (ZITHROMAX) 500 MG tablet Take 1 tablet (500 mg total) by mouth daily. (Patient not taking: Reported on 03/17/2019) 4 tablet 0  . budesonide-formoterol (SYMBICORT) 160-4.5 MCG/ACT inhaler Inhale 2 puffs into the lungs 2 (two) times daily.    Marland Kitchen dexamethasone (DECADRON) 6 MG tablet Take 1 tablet (6 mg total) by mouth daily.  (Patient not taking: Reported on 03/17/2019) 10 tablet 0  . ipratropium (ATROVENT HFA) 17 MCG/ACT inhaler Inhale 2 puffs into the lungs every 6 (six) hours. (Patient not taking: Reported on 03/17/2019) 1 Inhaler 0  . Ipratropium-Albuterol (COMBIVENT) 20-100 MCG/ACT AERS respimat Inhale 1 puff into the lungs every 6 (six) hours. (Patient not taking: Reported on 03/17/2019) 4 g 1  . ipratropium-albuterol (DUONEB) 0.5-2.5 (3) MG/3ML SOLN Inhale 3 mLs into the lungs 4 (four) times daily as needed (for shortness of breath or wheezing).     . predniSONE (DELTASONE) 50 MG tablet Take 1 tablet (50 mg total) by mouth daily. (Patient not taking: Reported on 03/17/2019) 5 tablet 0    Objective: BP 139/87   Pulse 96   Temp 98.1 F (36.7 C) (Axillary)   Resp 17   Ht 5\' 9"  (1.753 m)   Wt 88 kg   SpO2 95%   BMI 28.65 kg/m  Exam: General: obese man in NAD, sitting up in bed, looking confused Eyes: conjuctival injection, PERRLA. EOMI.  ENTM: clear oropharynx Neck: supple Cardiovascular: RRR, no m/r/g. No pedal edema.  Respiratory: mild end expiratory wheeze present bilaterally in all lung fields Gastrointestinal: soft, NT, ND, umbilical hernia MSK: IO placed in R tib/fib Derm: telangiectasia in malar distribution Neuro: AOx2 to person, location, and year, does not know month, or situation Psych: psychomotor agitation including restlessness of the legs and desire to get up and get out of his bed.   Labs and Imaging: CBC BMET  Recent Labs  Lab 03/17/19 1400 03/17/19 1400 03/17/19 1457  WBC 13.7*  --   --   HGB 14.6   < > 15.6  HCT 45.5   < > 46.0  PLT 380  --   --    < > = values in this interval not displayed.   Recent Labs  Lab 03/17/19 1400 03/17/19 1400 03/17/19 1457  NA 140   < > 137  K 5.3*   < > 5.0  CL 104  --   --   CO2 18*  --   --   BUN 15  --   --   CREATININE 0.97  --   --   GLUCOSE 250*  --   --   CALCIUM 8.2*  --   --    < > = values in this interval not displayed.      EKG:  EKG Interpretation  Date/Time:  Thursday March 17 2019 14:02:59 EST Ventricular Rate:  103 PR Interval:    QRS Duration: 83 QT Interval:  354 QTC Calculation: 464 R Axis:   -41 Text Interpretation: Sinus tachycardia Left axis deviation Abnormal R-wave progression, late transition no significant  change since Dec 2020 Confirmed by Pricilla Loveless [phone]) on 03/17/2019 4:21:51 PM  CT Head Wo Contrast  Result Date: 03/17/2019 CLINICAL DATA:  Respiratory distress. EXAM: CT HEAD WITHOUT CONTRAST TECHNIQUE: Contiguous axial images were obtained from the base of the skull through the vertex without intravenous contrast. COMPARISON:  Head CT 10/09/2017 FINDINGS: Brain: Remote right cerebellar infarct is again demonstrated with encephalomalacia. No CT findings for acute cerebral infarct and no intracranial hemorrhage. No extra-axial fluid collections. The gray-white differentiation is maintained. The ventricles are in the midline without mass effect or shift. Vascular: No hyperdense vessel or unexpected calcification. Skull: No skull fractures or bone lesions. Sinuses/Orbits: Moderate mucoperiosteal thickening involving both maxillary sinuses and scattered ethmoid air cells. The mastoid air cells and middle ear cavities are clear. Other: No scalp lesions or hematoma. IMPRESSION: 1. Remote right cerebellar infarct. 2. No acute intracranial findings or mass lesions. 3. Paranasal sinus disease. Electronically Signed   By: Rudie Meyer M.D.   On: 03/17/2019 16:24   CT ANGIO CHEST PE W OR WO CONTRAST  Result Date: 03/17/2019 CLINICAL DATA:  Shortness of breath. Respiratory distress. Pulseless CPR. EXAM: CT ANGIOGRAPHY CHEST WITH CONTRAST TECHNIQUE: Multidetector CT imaging of the chest was performed using the standard protocol during bolus administration of intravenous contrast. Multiplanar CT image reconstructions and MIPs were obtained to evaluate the vascular anatomy. CONTRAST:  OMNIPAQUE  IOHEXOL 350 MG/ML SOLN COMPARISON:  None. FINDINGS: Cardiovascular: The heart is normal in size. No pericardial effusion. The aorta is normal in caliber. No dissection. Moderate atherosclerotic calcifications at the aortic arch. No obvious coronary artery calcifications. The pulmonary arterial tree is fairly well opacified. No definite filling defects to suggest pulmonary embolism. Mediastinum/Nodes: No mediastinal or hilar mass or adenopathy. The esophagus is grossly normal. Lungs/Pleura: No acute pulmonary findings. No infiltrates, edema or effusions. No worrisome pulmonary lesions. Minimal dependent subpleural atelectasis/edema. Upper Abdomen: No significant upper abdominal findings. Musculoskeletal: No chest wall mass, supraclavicular or axillary adenopathy. The thyroid gland appears normal. The bony thorax is intact. Review of the MIP images confirms the above findings. IMPRESSION: 1. No CT findings for pulmonary embolism. 2. Age advanced atherosclerotic calcifications involving the thoracic aorta but no aneurysm or dissection. 3. No acute pulmonary findings. Aortic Atherosclerosis (ICD10-I70.0). Aortic Atherosclerosis (ICD10-I70.0). Electronically Signed   By: Rudie Meyer M.D.   On: 03/17/2019 16:30   DG Chest Portable 1 View  Result Date: 03/17/2019 CLINICAL DATA:  Status post cardiac arrest. EXAM: PORTABLE CHEST 1 VIEW COMPARISON:  Chest radiograph 01/10/2019 FINDINGS: Heart size within normal limits. No evidence of airspace consolidation within the lungs. The right lateral costophrenic angle is not entirely included the field of view. Within this limitation, no evidence of pleural effusion or pneumothorax. No acute bony abnormality identified. Tubing traverses the left neck, upper mediastinum, crosses midline and courses toward the right hilum. This may reflect a left-sided approach central venous catheter, although the tip position is not well delineated on the current exam. IMPRESSION: No evidence  of acute cardiopulmonary abnormality. Tubing traverses the left neck, upper mediastinum, crosses midline and courses toward the right hilum. This may reflect a left-sided approach central venous catheter, although the tip position is not well delineated on the current exam. Electronically Signed   By: Jackey Loge DO   On: 03/17/2019 14:14     Shirlean Mylar, MD 03/17/2019, 5:00 PM PGY-1, [facility] Family Medicine FPTS Intern pager: [phone], text pages welcome  Resident Addendum I have separately seen and  examined the patient.  I have discussed the findings and exam with the resident and agree with the above note.  I helped develop the management plan that is described in the resident's note and I agree with the content.  Changes have been made in BLUE.    Lenor Coffinaniel Yoandri Congrove, MD PGY-2 [facility]

## 2019-03-17 NOTE — ED Provider Notes (Signed)
[facility] Emergency Department Provider Note MRN:  [id number]  Arrival date & time: 03/17/19     Chief Complaint   Cardiac Arrest   History of Present Illness   Robert Rich is a 54 y.o. year-old male with a history of COPD, MI presenting to the ED with chief complaint of cardiac arrest.  Patient was reportedly experiencing severe shortness of breath and respiratory distress, was cyanotic upon EMS arrival and was pulseless.  Patient received 2 minutes of CPR, 1 mg of epinephrine, was reportedly in PEA arrest.  Also received 2 g magnesium, [place] airway was placed, fentanyl and Versed were given for sedation.  Review of Systems  Positive for respiratory distress, cardiac arrest.  Patient's Health History    Past Medical History:  Diagnosis Date  . COPD (chronic obstructive pulmonary disease) ([place])   . Coronary artery disease   . Heart attack ([place])   . Stroke [facility])     Past Surgical History:  Procedure Laterality Date  . NO PAST SURGERIES      Family History  Problem Relation Age of Onset  . Kidney disease Father     Social History   Socioeconomic History  . Marital status: Married    Spouse name: Not on file  . Number of children: Not on file  . Years of education: Not on file  . Highest education level: Not on file  Occupational History  . Not on file  Tobacco Use  . Smoking status: Current Some Day Smoker    Packs/day: 0.10  . Smokeless tobacco: Never Used  Substance and Sexual Activity  . Alcohol use: Yes    Alcohol/week: 12.0 standard drinks    Types: 12 Cans of beer per week    Comment: Weekly  . Drug use: Not Currently  . Sexual activity: Yes  Other Topics Concern  . Not on file  Social History Narrative  . Not on file   Social Determinants of Health   Financial Resource Strain:   . Difficulty of Paying Living Expenses: Not on file  Food Insecurity:   . Worried About Charity fundraiser in the Last Year: Not on file  .  Ran Out of Food in the Last Year: Not on file  Transportation Needs:   . Lack of Transportation (Medical): Not on file  . Lack of Transportation (Non-Medical): Not on file  Physical Activity:   . Days of Exercise per Week: Not on file  . Minutes of Exercise per Session: Not on file  Stress:   . Feeling of Stress : Not on file  Social Connections:   . Frequency of Communication with Friends and Family: Not on file  . Frequency of Social Gatherings with Friends and Family: Not on file  . Attends Religious Services: Not on file  . Active Member of Clubs or Organizations: Not on file  . Attends Archivist Meetings: Not on file  . Marital Status: Not on file  Intimate Partner Violence:   . Fear of Current or Ex-Partner: Not on file  . Emotionally Abused: Not on file  . Physically Abused: Not on file  . Sexually Abused: Not on file     Physical Exam   Vitals:   03/17/19 1415  BP: 117/89  Pulse: 100  Resp: 20  Temp: 98.1 F (36.7 C)  SpO2: 100%    CONSTITUTIONAL: Well-appearing, NAD NEURO: Somnolent but wakes to voice and answers questions, moves all extremities EYES:  eyes  equal and reactive ENT/NECK:  no LAD, no JVD CARDIO: Regular rate, well-perfused, normal S1 and S2 PULM:  CTAB no wheezing or rhonchi, tachypneic GI/GU:  normal bowel sounds, non-distended, non-tender MSK/SPINE:  No gross deformities, no edema SKIN:  no rash, atraumatic PSYCH: Mildly combative speech and behavior  *Additional and/or pertinent findings included in MDM below  Diagnostic and Interventional Summary    EKG Interpretation  Date/Time:  03/17/2019 at 14: 02: 59 Ventricular Rate:  103 PR Interval:  164 QRS Duration: 83 QT Interval:  354 QTC Calculation: 464 R Axis:     Text Interpretation: Sinus rhythm, left axis deviation Confirmed by Dr. Kennis Carina at 2:37 PM      Cardiac Monitoring Interpretation:  Labs Reviewed  CBC - Abnormal; Notable for the following components:       Result Value   WBC 13.7 (*)    All other components within normal limits  COMPREHENSIVE METABOLIC PANEL - Abnormal; Notable for the following components:   Potassium 5.3 (*)    CO2 18 (*)    Glucose, Bld 250 (*)    Calcium 8.2 (*)    AST 95 (*)    ALT 46 (*)    Total Bilirubin 0.2 (*)    Anion gap 18 (*)    All other components within normal limits  POCT I-STAT EG7 - Abnormal; Notable for the following components:   pO2, Ven 166.0 (*)    Acid-base deficit 5.0 (*)    Calcium, Ion 1.04 (*)    All other components within normal limits  TROPONIN I (HIGH SENSITIVITY) - Abnormal; Notable for the following components:   Troponin I (High Sensitivity) 42 (*)    All other components within normal limits  ETHANOL  LACTIC ACID, PLASMA  RAPID URINE DRUG SCREEN, HOSP PERFORMED  POC SARS CORONAVIRUS 2 AG -  ED    DG Chest Portable 1 View  Final Result    CT ANGIO CHEST PE W OR WO CONTRAST    (Results Pending)  CT Head Wo Contrast    (Results Pending)    Medications  albuterol (VENTOLIN) (5 MG/ML) 0.5% continuous inhalation solution (  Given 03/17/19 1419)  sodium chloride 0.9 % bolus 1,000 mL (1,000 mLs Intravenous New Bag/Given 03/17/19 1448)  methylPREDNISolone sodium succinate (SOLU-MEDROL) 125 mg/2 mL injection 125 mg (125 mg Intravenous Given 03/17/19 1436)  LORazepam (ATIVAN) injection 2 mg (2 mg Intravenous Given 03/17/19 1436)     Procedures  /  Critical Care .Critical Care Performed by: Sabas Sous, MD Authorized by: Sabas Sous, MD   Critical care provider statement:    Critical care time (minutes):  33   Critical care was necessary to treat or prevent imminent or life-threatening deterioration of the following conditions:  Respiratory failure and cardiac failure (Cardiac arrest with return of spontaneous circulation)   Critical care was time spent personally by me on the following activities:  Discussions with consultants, evaluation of patient's response to  treatment, examination of patient, ordering and performing treatments and interventions, ordering and review of laboratory studies, ordering and review of radiographic studies, pulse oximetry, re-evaluation of patient's condition, obtaining history from patient or surrogate and review of old charts    ED Course and Medical Decision Making  I have reviewed the triage vital signs, the nursing notes, and pertinent available records from the EMR.  Pertinent labs & imaging results that were available during my care of the patient were reviewed by me and considered in  my medical decision making (see below for details).     Given the respiratory distress and subsequent PEA arrest, there is concern for pulmonary embolism.  Could also be explained by COPD exacerbation and hypoxia causing a cardiac arrhythmia.  Arrived with [facility] airway in place but was moving spontaneously, fighting the device, and so the [facility] airway was successfully removed and he was trialed without definitive airway.  He is doing well, conversing, protecting airway, will titrate down oxygen as tolerated, providing continuous nebs, Solu-Medrol, if creatinine allows will obtain CTA chest.  Post arrest EKG is without signs of ischemia.  Patient endorses heavy alcohol use today, which could also be contributing.  He is a bit combative, he is not oriented to time, he is requiring some sedation to remain calm and keep him here for further evaluation.  Signed out to oncoming provider at shift change, anticipating need for admission to stepdown.    Elmer Sow. Pilar Plate, MD [facility] Emergency Medicine [facility] [email or web address]  Final Clinical Impressions(s) / ED Diagnoses     ICD-10-CM   1. Cardiopulmonary arrest [facility])  I46.9     ED Discharge Orders    None       Discharge Instructions Discussed with and Provided to Patient:   Discharge Instructions   None       Sabas Sous, MD 03/17/19 1526

## 2019-03-18 ENCOUNTER — Encounter (HOSPITAL_COMMUNITY): Payer: Self-pay | Admitting: Family Medicine

## 2019-03-18 ENCOUNTER — Inpatient Hospital Stay (HOSPITAL_COMMUNITY): Payer: Self-pay

## 2019-03-18 ENCOUNTER — Other Ambulatory Visit: Payer: Self-pay

## 2019-03-18 ENCOUNTER — Other Ambulatory Visit (HOSPITAL_COMMUNITY): Payer: Self-pay

## 2019-03-18 ENCOUNTER — Encounter (HOSPITAL_COMMUNITY)
Admission: EM | Disposition: A | Discharge status: Home / Self Care | Payer: Self-pay | Source: Home / Self Care | Attending: Family Medicine

## 2019-03-18 HISTORY — PX: LEFT HEART CATH AND CORONARY ANGIOGRAPHY: CATH118249

## 2019-03-18 HISTORY — PX: CORONARY PRESSURE/FFR STUDY: CATH118243

## 2019-03-18 LAB — CBC
HCT: 40.7 % (ref 39.0–52.0)
Hemoglobin: 13.4 g/dL (ref 13.0–17.0)
MCH: 29.5 pg (ref 26.0–34.0)
MCHC: 32.9 g/dL (ref 30.0–36.0)
MCV: 89.6 fL (ref 80.0–100.0)
Platelets: 331 10*3/uL (ref 150–400)
RBC: 4.54 MIL/uL (ref 4.22–5.81)
RDW: 13.6 % (ref 11.5–15.5)
WBC: 14.4 10*3/uL — ABNORMAL HIGH (ref 4.0–10.5)
nRBC: 0 % (ref 0.0–0.2)

## 2019-03-18 LAB — LIPID PANEL
Cholesterol: 185 mg/dL (ref 0–200)
HDL: 61 mg/dL (ref >40)
LDL Cholesterol: 108 mg/dL — ABNORMAL HIGH (ref 0–99)
Total CHOL/HDL Ratio: 3 RATIO
Triglycerides: 80 mg/dL (ref <150)
VLDL: 16 mg/dL (ref 0–40)

## 2019-03-18 LAB — TSH: TSH: 0.487 u[IU]/mL (ref 0.350–4.500)

## 2019-03-18 LAB — COMPREHENSIVE METABOLIC PANEL
ALT: 48 U/L — ABNORMAL HIGH (ref 0–44)
AST: 53 U/L — ABNORMAL HIGH (ref 15–41)
Albumin: 3.8 g/dL (ref 3.5–5.0)
Alkaline Phosphatase: 51 U/L (ref 38–126)
Anion gap: 10 (ref 5–15)
BUN: 15 mg/dL (ref 6–20)
CO2: 21 mmol/L — ABNORMAL LOW (ref 22–32)
Calcium: 8.5 mg/dL — ABNORMAL LOW (ref 8.9–10.3)
Chloride: 104 mmol/L (ref 98–111)
Creatinine, Ser: 1 mg/dL (ref 0.61–1.24)
GFR calc Af Amer: >60 mL/min (ref >60)
GFR calc non Af Amer: >60 mL/min (ref >60)
Glucose, Bld: 282 mg/dL — ABNORMAL HIGH (ref 70–99)
Potassium: 4.6 mmol/L (ref 3.5–5.1)
Sodium: 135 mmol/L (ref 135–145)
Total Bilirubin: 0.7 mg/dL (ref 0.3–1.2)
Total Protein: 7 g/dL (ref 6.5–8.1)

## 2019-03-18 LAB — PROTIME-INR
INR: 1.1 (ref 0.8–1.2)
Prothrombin Time: 14 seconds (ref 11.4–15.2)

## 2019-03-18 LAB — CK
Total CK: 663 U/L — ABNORMAL HIGH (ref 49–397)
Total CK: 668 U/L — ABNORMAL HIGH (ref 49–397)

## 2019-03-18 LAB — ECHOCARDIOGRAM COMPLETE
Height: 69 in
Weight: 3104.08 oz

## 2019-03-18 LAB — MAGNESIUM: Magnesium: 2.1 mg/dL (ref 1.7–2.4)

## 2019-03-18 LAB — GLUCOSE, CAPILLARY
Glucose-Capillary: 226 mg/dL — ABNORMAL HIGH (ref 70–99)
Glucose-Capillary: 219 mg/dL — ABNORMAL HIGH (ref 70–99)

## 2019-03-18 LAB — HEMOGLOBIN A1C
Hgb A1c MFr Bld: 7.3 % — ABNORMAL HIGH (ref 4.8–5.6)
Mean Plasma Glucose: 162.81 mg/dL

## 2019-03-18 LAB — PHOSPHORUS: Phosphorus: 2.9 mg/dL (ref 2.5–4.6)

## 2019-03-18 LAB — BRAIN NATRIURETIC PEPTIDE: B Natriuretic Peptide: 107.3 pg/mL — ABNORMAL HIGH (ref 0.0–100.0)

## 2019-03-18 LAB — POCT ACTIVATED CLOTTING TIME: Activated Clotting Time: 208 seconds

## 2019-03-18 SURGERY — LEFT HEART CATH AND CORONARY ANGIOGRAPHY
Anesthesia: LOCAL

## 2019-03-18 MED ORDER — SODIUM CHLORIDE 0.9% FLUSH: 3.0000 mL | INTRAVENOUS | Status: DC | PRN

## 2019-03-18 MED ORDER — HEPARIN SODIUM (PORCINE) 1000 UNIT/ML IJ SOLN
INTRAMUSCULAR | Status: DC | PRN
  Administered 2019-03-18: 5000 [IU] via INTRAVENOUS
  Administered 2019-03-18 (×2): 3000 [IU] via INTRAVENOUS

## 2019-03-18 MED ORDER — SODIUM CHLORIDE 0.9% FLUSH
3.0000 mL | Freq: Two times a day (BID) | INTRAVENOUS | Status: DC
  Administered 2019-03-18 – 2019-03-19 (×2): 3 mL via INTRAVENOUS

## 2019-03-18 MED ORDER — THIAMINE HCL 100 MG/ML IJ SOLN: 250.0000 mg | INTRAVENOUS | Status: DC

## 2019-03-18 MED ORDER — LABETALOL HCL 5 MG/ML IV SOLN
10.0000 mg | INTRAVENOUS | Status: AC | PRN
  Administered 2019-03-18: 10 mg via INTRAVENOUS

## 2019-03-18 MED ORDER — INSULIN ASPART 100 UNIT/ML ~~LOC~~ SOLN
0.0000 [IU] | Freq: Three times a day (TID) | SUBCUTANEOUS | Status: DC
  Administered 2019-03-18: 3 [IU] via SUBCUTANEOUS
  Administered 2019-03-19: 2 [IU] via SUBCUTANEOUS
  Administered 2019-03-19 (×2): 5 [IU] via SUBCUTANEOUS
  Administered 2019-03-19: 2 [IU] via SUBCUTANEOUS
  Administered 2019-03-20: 5 [IU] via SUBCUTANEOUS
  Administered 2019-03-20: 3 [IU] via SUBCUTANEOUS

## 2019-03-18 MED ORDER — SODIUM CHLORIDE 0.9 % IV SOLN: INTRAVENOUS | Status: DC

## 2019-03-18 MED ORDER — THIAMINE HCL 100 MG/ML IJ SOLN
500.0000 mg | Freq: Every day | INTRAVENOUS | Status: DC
  Administered 2019-03-18 – 2019-03-19 (×2): 500 mg via INTRAVENOUS
  Filled 2019-03-18 (×3): qty 5

## 2019-03-18 MED ORDER — MIDAZOLAM HCL 2 MG/2ML IJ SOLN
INTRAMUSCULAR | Status: DC | PRN
  Administered 2019-03-18: 0.5 mg via INTRAVENOUS

## 2019-03-18 MED ORDER — HEPARIN SODIUM (PORCINE) 5000 UNIT/ML IJ SOLN
5000.0000 [IU] | Freq: Three times a day (TID) | INTRAMUSCULAR | Status: DC
  Administered 2019-03-19 (×2): 5000 [IU] via SUBCUTANEOUS
  Filled 2019-03-18 (×2): qty 1

## 2019-03-18 MED ORDER — VERAPAMIL HCL 2.5 MG/ML IV SOLN
INTRAVENOUS | Status: AC
  Filled 2019-03-18: qty 2

## 2019-03-18 MED ORDER — IBUPROFEN 200 MG PO TABS
400.0000 mg | ORAL_TABLET | Freq: Three times a day (TID) | ORAL | Status: AC
  Administered 2019-03-18 – 2019-03-19 (×3): 400 mg via ORAL
  Filled 2019-03-18 (×3): qty 2

## 2019-03-18 MED ORDER — VERAPAMIL HCL 2.5 MG/ML IV SOLN
INTRA_ARTERIAL | Status: DC | PRN
  Administered 2019-03-18: 7 mL via INTRA_ARTERIAL

## 2019-03-18 MED ORDER — POTASSIUM CHLORIDE CRYS ER 10 MEQ PO TBCR
10.0000 meq | EXTENDED_RELEASE_TABLET | Freq: Three times a day (TID) | ORAL | Status: AC
  Administered 2019-03-18 – 2019-03-19 (×3): 10 meq via ORAL
  Filled 2019-03-18 (×4): qty 1

## 2019-03-18 MED ORDER — SODIUM CHLORIDE 0.9 % IV SOLN: 250.0000 mL | INTRAVENOUS | Status: DC | PRN

## 2019-03-18 MED ORDER — LIDOCAINE HCL (PF) 1 % IJ SOLN
INTRAMUSCULAR | Status: DC | PRN
  Administered 2019-03-18: 2 mL via INTRADERMAL

## 2019-03-18 MED ORDER — HEPARIN (PORCINE) IN NACL 1000-0.9 UT/500ML-% IV SOLN
INTRAVENOUS | Status: DC | PRN
  Administered 2019-03-18 (×2): 500 mL

## 2019-03-18 MED ORDER — FUROSEMIDE 10 MG/ML IJ SOLN
40.0000 mg | Freq: Three times a day (TID) | INTRAMUSCULAR | Status: AC
  Administered 2019-03-18 – 2019-03-19 (×3): 40 mg via INTRAVENOUS
  Filled 2019-03-18 (×3): qty 4

## 2019-03-18 MED ORDER — LISINOPRIL 5 MG PO TABS
5.0000 mg | ORAL_TABLET | Freq: Every day | ORAL | Status: DC
  Administered 2019-03-18 – 2019-03-20 (×3): 5 mg via ORAL
  Filled 2019-03-18 (×3): qty 1

## 2019-03-18 MED ORDER — INSULIN ASPART 100 UNIT/ML ~~LOC~~ SOLN: 0.0000 [IU] | Freq: Three times a day (TID) | SUBCUTANEOUS | Status: DC

## 2019-03-18 MED ORDER — NITROGLYCERIN 1 MG/10 ML FOR IR/CATH LAB
INTRA_ARTERIAL | Status: AC
  Filled 2019-03-18: qty 10

## 2019-03-18 MED ORDER — ADENOSINE 12 MG/4ML IV SOLN
INTRAVENOUS | Status: AC
  Filled 2019-03-18: qty 12

## 2019-03-18 MED ORDER — INSULIN ASPART 100 UNIT/ML ~~LOC~~ SOLN
0.0000 [IU] | SUBCUTANEOUS | Status: DC
  Administered 2019-03-18: 3 [IU] via SUBCUTANEOUS

## 2019-03-18 MED ORDER — HEPARIN (PORCINE) IN NACL 1000-0.9 UT/500ML-% IV SOLN
INTRAVENOUS | Status: AC
  Filled 2019-03-18: qty 1000

## 2019-03-18 MED ORDER — ALBUTEROL SULFATE (2.5 MG/3ML) 0.083% IN NEBU
INHALATION_SOLUTION | RESPIRATORY_TRACT | Status: AC
  Filled 2019-03-18: qty 3

## 2019-03-18 MED ORDER — SODIUM CHLORIDE 0.9% FLUSH
3.0000 mL | Freq: Two times a day (BID) | INTRAVENOUS | Status: DC
  Administered 2019-03-19 (×2): 3 mL via INTRAVENOUS

## 2019-03-18 MED ORDER — FENTANYL CITRATE (PF) 100 MCG/2ML IJ SOLN
INTRAMUSCULAR | Status: AC
  Filled 2019-03-18: qty 2

## 2019-03-18 MED ORDER — SODIUM CHLORIDE 0.9 % WEIGHT BASED INFUSION: 3.0000 mL/kg/h | INTRAVENOUS | Status: DC

## 2019-03-18 MED ORDER — HYDRALAZINE HCL 20 MG/ML IJ SOLN: 10.0000 mg | INTRAMUSCULAR | Status: AC | PRN

## 2019-03-18 MED ORDER — LABETALOL HCL 5 MG/ML IV SOLN
INTRAVENOUS | Status: AC
  Filled 2019-03-18: qty 4

## 2019-03-18 MED ORDER — ADENOSINE (DIAGNOSTIC) 140MCG/KG/MIN
INTRAVENOUS | Status: DC | PRN
  Administered 2019-03-18: 140 ug/kg/min via INTRAVENOUS

## 2019-03-18 MED ORDER — METOPROLOL TARTRATE 12.5 MG HALF TABLET
12.5000 mg | ORAL_TABLET | Freq: Two times a day (BID) | ORAL | Status: DC
  Administered 2019-03-18 – 2019-03-19 (×2): 12.5 mg via ORAL
  Filled 2019-03-18 (×3): qty 1

## 2019-03-18 MED ORDER — ONDANSETRON HCL 4 MG/2ML IJ SOLN: 4.0000 mg | Freq: Four times a day (QID) | INTRAMUSCULAR | Status: DC | PRN

## 2019-03-18 MED ORDER — LIDOCAINE 5 % EX PTCH
1.0000 | MEDICATED_PATCH | CUTANEOUS | Status: DC
  Administered 2019-03-18 – 2019-03-19 (×2): 1 via TRANSDERMAL
  Filled 2019-03-18 (×2): qty 1

## 2019-03-18 MED ORDER — ATORVASTATIN CALCIUM 40 MG PO TABS
40.0000 mg | ORAL_TABLET | Freq: Every day | ORAL | Status: DC
  Administered 2019-03-18 – 2019-03-19 (×2): 40 mg via ORAL
  Filled 2019-03-18 (×2): qty 1

## 2019-03-18 MED ORDER — LIDOCAINE HCL (PF) 1 % IJ SOLN
INTRAMUSCULAR | Status: AC
  Filled 2019-03-18: qty 30

## 2019-03-18 MED ORDER — PERFLUTREN LIPID MICROSPHERE
1.0000 mL | INTRAVENOUS | Status: AC | PRN
  Administered 2019-03-18: 5 mL via INTRAVENOUS
  Filled 2019-03-18: qty 10

## 2019-03-18 MED ORDER — HEPARIN SODIUM (PORCINE) 1000 UNIT/ML IJ SOLN
INTRAMUSCULAR | Status: AC
  Filled 2019-03-18: qty 1

## 2019-03-18 MED ORDER — FENTANYL CITRATE (PF) 100 MCG/2ML IJ SOLN
INTRAMUSCULAR | Status: DC | PRN
  Administered 2019-03-18: 25 ug via INTRAVENOUS

## 2019-03-18 MED ORDER — HEPARIN SODIUM (PORCINE) 5000 UNIT/ML IJ SOLN: 5000.0000 [IU] | Freq: Three times a day (TID) | INTRAMUSCULAR | Status: DC

## 2019-03-18 MED ORDER — IOHEXOL 350 MG/ML SOLN
INTRAVENOUS | Status: DC | PRN
  Administered 2019-03-18: 130 mL via INTRA_ARTERIAL

## 2019-03-18 MED ORDER — ADENOSINE 12 MG/4ML IV SOLN
INTRAVENOUS | Status: AC
  Filled 2019-03-18: qty 4

## 2019-03-18 MED ORDER — HEPARIN BOLUS VIA INFUSION
4000.0000 [IU] | Freq: Once | INTRAVENOUS | Status: DC
  Filled 2019-03-18: qty 4000

## 2019-03-18 MED ORDER — SODIUM CHLORIDE 0.9 % WEIGHT BASED INFUSION: 1.0000 mL/kg/h | INTRAVENOUS | Status: DC

## 2019-03-18 MED ORDER — MIDAZOLAM HCL 2 MG/2ML IJ SOLN
INTRAMUSCULAR | Status: AC
  Filled 2019-03-18: qty 2

## 2019-03-18 MED ORDER — HEPARIN (PORCINE) 25000 UT/250ML-% IV SOLN: 1000.0000 [IU]/h | INTRAVENOUS | Status: DC

## 2019-03-18 MED ORDER — FUROSEMIDE 10 MG/ML IJ SOLN
INTRAMUSCULAR | Status: AC
  Filled 2019-03-18: qty 4

## 2019-03-18 MED ORDER — FUROSEMIDE 10 MG/ML IJ SOLN
INTRAMUSCULAR | Status: DC | PRN
  Administered 2019-03-18: 40 mg via INTRAVENOUS

## 2019-03-18 SURGICAL SUPPLY — 13 items
CATH MICROCATH NAVVUS (MICROCATHETER) ×1 IMPLANT
CATH OPTITORQUE TIG 4.0 5F (CATHETERS) ×2 IMPLANT
CATH VISTA GUIDE 6FR XB3.5 (CATHETERS) ×2 IMPLANT
DEVICE RAD COMP TR BAND LRG (VASCULAR PRODUCTS) ×2 IMPLANT
GLIDESHEATH SLEND A-KIT 6F 22G (SHEATH) ×2 IMPLANT
GUIDEWIRE INQWIRE 1.5J.035X260 (WIRE) ×1 IMPLANT
INQWIRE 1.5J .035X260CM (WIRE) ×2
KIT HEART LEFT (KITS) ×2 IMPLANT
MICROCATHETER NAVVUS (MICROCATHETER) ×2
PACK CARDIAC CATHETERIZATION (CUSTOM PROCEDURE TRAY) ×2 IMPLANT
TRANSDUCER W/STOPCOCK (MISCELLANEOUS) ×2 IMPLANT
TUBING CIL FLEX 10 FLL-RA (TUBING) ×2 IMPLANT
WIRE COUGAR XT STRL 190CM (WIRE) ×2 IMPLANT

## 2019-03-18 NOTE — Progress Notes (Signed)
   03/18/19 0900  OT Visit Information  Last OT Received On 03/18/19  Reason Eval/Treat Not Completed Patient at procedure or test/ unavailable (cath lab)  Martie Round, OTR/L Acute Rehabilitation Services Pager: [phone] Office: [phone]

## 2019-03-18 NOTE — Plan of Care (Signed)
New admission

## 2019-03-18 NOTE — Progress Notes (Signed)
Called to cathlab to given albuterol neb. Patient then transported back to his room. Stated he feels a bit better.

## 2019-03-18 NOTE — H&P (View-Only) (Signed)
Patient personally evaluated by me and examined, patient with V. fib arrest, hypertension, hyperlipidemia and diabetes mellitus and tobacco use disorder, although cardiac markers are not significantly elevated and no significant EKG abnormality, ischemic etiology needs to be excluded.  I have discussed with the patient regarding risks and benefits of cardiac catheterization and he is willing to proceed.  He also called his mother over the telephone and discussed with her that he is going with heart catheterization today.  I have reviewed discharge and also labs and updated them.  Naila Elizondo, MD, FACC 03/18/2019, 9:59 AM [facility] Office: [phone]  

## 2019-03-18 NOTE — Progress Notes (Signed)
Patient personally evaluated by me and examined, patient with V. fib arrest, hypertension, hyperlipidemia and diabetes mellitus and tobacco use disorder, although cardiac markers are not significantly elevated and no significant EKG abnormality, ischemic etiology needs to be excluded.  I have discussed with the patient regarding risks and benefits of cardiac catheterization and he is willing to proceed.  He also called his mother over the telephone and discussed with her that he is going with heart catheterization today.  I have reviewed discharge and also labs and updated them.  [name], MD, [facility] 03/18/2019, 9:59 AM [facility] Office: [phone]

## 2019-03-18 NOTE — Evaluation (Signed)
Physical Therapy Evaluation Patient Details Name: Robert Rich MRN: [id number] DOB: 23-Jun-1965 Today's Date: 03/18/2019   History of Present Illness  Pt is a 54 y.o. male admitted 03/17/19 with respiratory distress and brief PEA arrest (found down at home, CPR began by bystander, ROSC achieved within 2 min of CPR by EMS). CTA/CXR do not show  any acute cardiopulmonary process. CT head with no acute process. Going for cardiac cath 2/19. PMH includes COPD, CAD, CVA, (+) COVID-19 11/2018.    Clinical Impression  Pt presents with an overall decrease in functional mobility secondary to above. 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 Today, pt able to perform hallway ambulation without DME, requiring intermittent minA to correct instability. Pt with poor safety awareness and impaired problem solving. C/o chest soreness, educ on sternal precautions for comfort secondary to soreness from CPR. Expect pt to progress well with mobility. Pt would benefit from continued acute PT services to maximize functional mobility and independence prior to d/c home.     Follow Up Recommendations No PT follow up;Supervision for mobility/OOB    Equipment Recommendations  None recommended by PT    Recommendations for Other Services       Precautions / Restrictions Precautions Precautions: Fall Restrictions Weight Bearing Restrictions: No      Mobility  Bed Mobility Overal bed mobility: Modified Independent             General bed mobility comments: HOB slighly elevated, increased time and effort; pt performed log roll without cues, reinforced this technique for comfort due to chest soreness s/p CPR  Transfers Overall transfer level: Needs assistance Equipment used: None Transfers: Sit to/from Stand Sit to Stand: Min guard         General transfer comment: Min guard for balance due to instability, no physical asssit  required  Ambulation/Gait Ambulation/Gait assistance: Min guard;Min assist Gait Distance (Feet): 320 Feet Assistive device: None Gait Pattern/deviations: Step-through pattern;Decreased stride length;Drifts right/left;Staggering right;Staggering left   Gait velocity interpretation: <1.8 ft/sec, indicate of risk for recurrent falls General Gait Details: Slow, unsteady gait with close min guard for balance, intermittent minA to correct LOB/instability. Pt seems internally distracted by chest soreness. Requires cues for navigating back to room, could not remember room # despite being told a few times  Stairs            Wheelchair Mobility    Modified Rankin (Stroke Patients Only)       Balance Overall balance assessment: Needs assistance   Sitting balance-Leahy Scale: Fair       Standing balance-Leahy Scale: Fair                               Pertinent Vitals/Pain Pain Assessment: Faces Faces Pain Scale: Hurts little more Pain Location: Chest with deep breaths and activity Pain Descriptors / Indicators: Sore;Guarding Pain Intervention(s): Limited activity within patient's tolerance;Monitored during session    Home Living Family/patient expects to be discharged to:: Private residence Living Arrangements: Spouse/significant other(Fiance) Available Help at Discharge: Family;Friend(s);Available PRN/intermittently Type of Home: House Home Access: Level entry     Home Layout: One level Home Equipment: None      Prior Function Level of Independence: Independent         Comments: Independent, drives, owns car body shop     Hand Dominance  Extremity/Trunk Assessment   Upper Extremity Assessment Upper Extremity Assessment: Overall WFL for tasks assessed    Lower Extremity Assessment Lower Extremity Assessment: Overall WFL for tasks assessed       Communication   Communication: No difficulties  Cognition Arousal/Alertness:  Awake/alert Behavior During Therapy: Restless;Flat affect Overall Cognitive Status: No family/caregiver present to determine baseline cognitive functioning Area of Impairment: Attention;Memory;Following commands;Safety/judgement;Awareness;Problem solving                   Current Attention Level: Selective Memory: Decreased short-term memory Following Commands: Follows one step commands inconsistently Safety/Judgement: Decreased awareness of deficits;Decreased awareness of safety Awareness: Emergent Problem Solving: Slow processing;Requires verbal cues;Difficulty sequencing General Comments: Increased time and prompts to answer date (2021, February, Wednesday 24th); stating neighbor found him in backyard (although this is different story than given to MD). Poor problem solving with slowed processing. Distracted by chest soreness. Unable to navigate back to room despite multiple cues and given room #, unable to remember room #      General Comments General comments (skin integrity, edema, etc.): HR 97    Exercises     Assessment/Plan    PT Assessment Patient needs continued PT services  PT Problem List Decreased strength;Decreased activity tolerance;Decreased balance;Decreased mobility;Decreased cognition;Decreased safety awareness       PT Treatment Interventions DME instruction;Gait training;Stair training;Functional mobility training;Therapeutic activities;Therapeutic exercise;Balance training;Patient/family education    PT Goals (Current goals can be found in the Care Plan section)  Acute Rehab PT Goals Patient Stated Goal: "Can I go home today?" PT Goal Formulation: With patient Time For Goal Achievement: 04/01/19 Potential to Achieve Goals: Good    Frequency Min 3X/week   Barriers to discharge        Co-evaluation               AM-PAC PT "6 Clicks" Mobility  Outcome Measure Help needed turning from your back to your side while in a flat bed without  using bedrails?: None Help needed moving from lying on your back to sitting on the side of a flat bed without using bedrails?: None Help needed moving to and from a bed to a chair (including a wheelchair)?: A Little Help needed standing up from a chair using your arms (e.g., wheelchair or bedside chair)?: A Little Help needed to walk in hospital room?: A Little Help needed climbing 3-5 steps with a railing? : A Little 6 Click Score: 20    End of Session Equipment Utilized During Treatment: Gait belt Activity Tolerance: Patient tolerated treatment well;Patient limited by pain Patient left: in bed;with call bell/phone within reach;with bed alarm set;Other (comment)(with MD in room)   PT Visit Diagnosis: Other abnormalities of gait and mobility (R26.89)    Time: 0802-0820 PT Time Calculation (min) (ACUTE ONLY): 18 min   Charges:   PT Evaluation $PT Eval Moderate Complexity: [place], PT, DPT Acute Rehabilitation Services  Pager [phone] Office [phone]  Derry Lory 03/18/2019, 10:21 AM

## 2019-03-18 NOTE — Interval H&P Note (Signed)
History and Physical Interval Note:  03/18/2019 10:00 AM  Robert Rich  has presented today for surgery, with the diagnosis of unstable angina.  The various methods of treatment have been discussed with the patient and family. After consideration of risks, benefits and other options for treatment, the patient has consented to  Procedure(s): LEFT HEART CATH AND CORONARY ANGIOGRAPHY (N/A) and possible angioplasty as a surgical intervention.  The patient's history has been reviewed, patient examined, no change in status, stable for surgery.  I have reviewed the patient's chart and labs.  Questions were answered to the patient's satisfaction.   Cath Lab Visit (complete for each Cath Lab visit)  Clinical Evaluation Leading to the Procedure:   ACS: Yes.    Non-ACS:    Anginal Classification: CCS IV  Anti-ischemic medical therapy: Minimal Therapy (1 class of medications)  Non-Invasive Test Results: No non-invasive testing performed  Prior CABG: No previous CABG  Robert Rich

## 2019-03-18 NOTE — Progress Notes (Signed)
ANTICOAGULATION CONSULT NOTE - Initial Consult  Pharmacy Consult for heparin Indication: chest pain/ACS  Allergies  Allergen Reactions  . Zolmitriptan Nausea And Vomiting and Other (See Comments)    Severe headaches  . Asa [Aspirin] Hives    Patient Measurements: Height: 5\' 9"  (175.3 cm) Weight: 194 lb 0.1 oz (88 kg) IBW/kg (Calculated) : 70.7  Vital Signs: Temp: 98.6 F (37 C) (02/19 0757) Temp Source: Oral (02/19 0757) BP: 153/88 (02/19 0757) Pulse Rate: 92 (02/18 2356)  Labs: Recent Labs    03/17/19 1400 03/17/19 1400 03/17/19 1457 03/17/19 1750 03/17/19 2022 03/17/19 2200 03/18/19 0006 03/18/19 0244  HGB 14.6   < > 15.6  --   --   --  13.4  --   HCT 45.5  --  46.0  --   --   --  40.7  --   PLT 380  --   --   --   --   --  331  --   LABPROT  --   --   --   --   --   --  14.0  --   INR  --   --   --   --   --   --  1.1  --   CREATININE 0.97  --   --   --   --   --  1.00  --   CKTOTAL  --   --   --   --  535*  --  663* 668*  TROPONINIHS 42*   < >  --  168* 141* 97*  --   --    < > = values in this interval not displayed.    Estimated Creatinine Clearance: 93.8 mL/min (by C-G formula based on SCr of 1 mg/dL).   Medical History: Past Medical History:  Diagnosis Date  . COPD (chronic obstructive pulmonary disease) ([place])   . Coronary artery disease   . Heart attack ([place])   . Stroke [facility])     Medications:  Medications Prior to Admission  Medication Sig Dispense Refill Last Dose  . albuterol (VENTOLIN HFA) 108 (90 Base) MCG/ACT inhaler Inhale 2 puffs into the lungs every 6 (six) hours. (Patient taking differently: Inhale 2 puffs into the lungs every 6 (six) hours as needed for wheezing or shortness of breath. ) 6.7 g 0 unk at unk  . ALPRAZolam (XANAX) 0.5 MG tablet Take 0.5 mg by mouth 2 (two) times daily as needed for anxiety.    unk at unk  . sildenafil (REVATIO) 20 MG tablet Take 20-100 mg by mouth daily as needed (FOR E.D.).    unk at unk  . amLODipine  (NORVASC) 5 MG tablet Take 1 tablet (5 mg total) by mouth daily. (Patient not taking: Reported on 03/17/2019) 30 tablet 1 Not Taking at Unknown time  . azithromycin (ZITHROMAX) 500 MG tablet Take 1 tablet (500 mg total) by mouth daily. (Patient not taking: Reported on 03/17/2019) 4 tablet 0 Not Taking at Unknown time  . budesonide-formoterol (SYMBICORT) 160-4.5 MCG/ACT inhaler Inhale 2 puffs into the lungs 2 (two) times daily.   Not Taking at Unknown time  . dexamethasone (DECADRON) 6 MG tablet Take 1 tablet (6 mg total) by mouth daily. (Patient not taking: Reported on 03/17/2019) 10 tablet 0 Not Taking at Unknown time  . ipratropium (ATROVENT HFA) 17 MCG/ACT inhaler Inhale 2 puffs into the lungs every 6 (six) hours. (Patient not taking: Reported on 03/17/2019) 1 Inhaler 0 Not Taking  at Unknown time  . Ipratropium-Albuterol (COMBIVENT) 20-100 MCG/ACT AERS respimat Inhale 1 puff into the lungs every 6 (six) hours. (Patient not taking: Reported on 03/17/2019) 4 g 1 Not Taking at Unknown time  . ipratropium-albuterol (DUONEB) 0.5-2.5 (3) MG/3ML SOLN Inhale 3 mLs into the lungs 4 (four) times daily as needed (for shortness of breath or wheezing).    Not Taking at Unknown time  . predniSONE (DELTASONE) 50 MG tablet Take 1 tablet (50 mg total) by mouth daily. (Patient not taking: Reported on 03/17/2019) 5 tablet 0 Not Taking at Unknown time   Scheduled:  . folic acid  1 mg Oral Daily  . insulin aspart  0-9 Units Subcutaneous Q4H  . multivitamin with minerals  1 tablet Oral Daily  . thiamine  100 mg Oral Daily   Or  . thiamine  100 mg Intravenous Daily   Infusions:  . sodium chloride      Assessment: 54yo male presents s/p brief PEA arrest and respiratory distress, troponin found to be elevated but trending down, cards considering cardiac cath, to begin heparin.  Goal of Therapy:  Heparin level 0.3-0.7 units/ml Monitor platelets by anticoagulation protocol: Yes   Plan:  Heparin 4000 units IV bolus x1  followed by gtt at 1000 units/hr and monitor heparin levels and CBC.  Vernard Gambles, PharmD, BCPS  03/18/2019,8:03 AM

## 2019-03-18 NOTE — Progress Notes (Signed)
Family Medicine Teaching Service Daily Progress Note Intern Pager: [phone]  Patient name: Robert Rich Medical record number: [id number] Date of birth: 09-Dec-1965 Age: 54 y.o. Gender: male  Primary Care Provider: Bridget Hartshorn, NP Consultants: cardiology Code Status: Full  Pt Overview and Major Events to Date:  2/18: PEA arrest with ROSC, admitted  Assessment and Plan: Robert Rich is a 54 y.o. male presenting with respiratory distress and brief PEA arrest. PMH is significant for COPD, coronary disease, history CVA, h/o tobacco use, COVID-19 positive in 11/2018.  Brief PEA arrest Cardiology consulted and proceeding with heart cath this morning. Most likely etiology for cardiac arrest was COPD exacerbation, but ischemic cardiac cause needs to be ruled out.  Lactic acid increased 42.8>5.3>4.3. CK increased [phone]. BNP mildly elevated at 107, awaiting echo results. TSH WNL 0.487. Phos and Mag WNL 2.9, 2.2 respectively. Lovenox for DVT ppx will be restarted after cath. Further cardiac recommendations include: beta blocker, ACEI/ARB, and statin due to ASCVD>10%. Patient has ASA allergy, rash. -Consult cardiology, appreciate recommendations -Continuous cardiac monitoring -Follow-up echo results -Plan to start coreg, losartan, and crestor s/p cath -NS 125 mL/hr -Daily CMP, CBC, CK, LA trend -Heart healthy/carb modified diet after Cath -Lovenox for DVT prophylaxis s/p cath -PT/OT eval and treat  Acute hypoxic respiratory failure  COPD Overnight patient's respiratory status improved, patient is currently satting 95% on RA. Patient currently in cath. - Continuous pulse oximetry - Incentive spirometry - O2 therapy: maintain SpO2 to 88-92% via [place] - Albuterol neb q2h PRN wheezing - Douneb q4h PRN wheezing, SOB  Substance use disorder  possible alcohol withdrawal Unable to assess patient this morning as he has already gone for heart cath. Overnight CIWA scores 8>15>18>19.  Patient received 6mg  ativan IV. Given the lack of EtOH in his system and possible daily alcohol use along with tremulousness and psychomotor agitation there is concern for early alcohol withdrawal vs Wernicke-Korsakoff encephalopathy vs anoxic brain injury. -CIWA protocol -Thiamine lab follow up -Thiamine, folic acid, multivitamin -Consult social work for possible community resources for substance use disorder  DMT2 Glucose 250 on admission, ranged up to 282 overnight. Patient did receive one dose of solumedrol in ED yesterday. A1c today 7.3%. Start SSI with q4h CBG while NPO. -Daily BMP   - SSI  Transaminitis Improvement in AST, today values 53, 48 for AST/ALT respectively. AST/ALT 95, 46 on admission.  Patient had elevated AST/ALT in September 2015 to 51 and 67 respectively, at which time hepatitis work-up was negative for hepatitis A, B, or C.  Concern for alcohol withdrawal on admission today, CIWA's in place, see above for polysubstance use for treatment.  -CMP daily -Hep C ab pending  Hypertension BP's range from normo to hypertensive: SBP 110-168, DBP 68-103, most recently 135/78. Patient previously on verapamil, but has not taken in many months. Plan to start ARB due to diabetes and renal protective factor. -Continue to monitor - discontinue verapamil - start losartan 25 mg daily  HLD Lipid panel: Total cholesterol 185, HDL 61, LDL 108, triglycerides 80. ASCVD 10 year risk 20%, recommend at least moderate-intensity statin. Patient with elevated lipids since 2015 and on a statin since CVA in 2016.  Most recently taking Crestor 5 mg charted in August 2020.  However according to pharmacist tech prescription review, patient has not picked up any prescription other than Ativan.  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  Patient previously was not on secondary prevention prior to  this.  Afterwards he was on Plavix and Lipitor 80.  The time of the stroke, echocardiogram with Doppler and bubble study was obtained in October 2016 that showed no cardiac etiology for the stroke.  FEN/GI: NPO pending cath PPx: SCDs  Disposition: to progressive pending medical stabilization and work up  Subjective:  Patient for procedure today, unable to assess.  Objective: Temp:  [97.7 F (36.5 C)-98.5 F (36.9 C)] 97.7 F (36.5 C) (02/19 0400) Pulse Rate:  [92-105] 92 (02/18 2356) Resp:  [13-27] 22 (02/18 2356) BP: (110-168)/(68-103) 135/78 (02/18 2356) SpO2:  [91 %-98 %] 95 % (02/19 0400) Weight:  [88 kg] 88 kg (02/18 2356) Physical Exam: General: see attending attestation Cardiovascular: see attending attestation Respiratory: see attending attestation Abdomen: see attending attestation Extremities: see attending attestation  Laboratory: Recent Labs  Lab 03/17/19 1400 03/17/19 1457 03/18/19 0006  WBC 13.7*  --  14.4*  HGB 14.6 15.6 13.4  HCT 45.5 46.0 40.7  PLT 380  --  331   Recent Labs  Lab 03/17/19 1400 03/17/19 1457 03/18/19 0006  NA 140 137 135  K 5.3* 5.0 4.6  CL 104  --  104  CO2 18*  --  21*  BUN 15  --  15  CREATININE 0.97  --  1.00  CALCIUM 8.2*  --  8.5*  PROT 6.6  --  7.0  BILITOT 0.2*  --  0.7  ALKPHOS 60  --  51  ALT 46*  --  48*  AST 95*  --  53*  GLUCOSE 250*  --  282*   Imaging/Diagnostic Tests: CT Head Wo Contrast  Result Date: 03/17/2019 CLINICAL DATA:  Respiratory distress. EXAM: CT HEAD WITHOUT CONTRAST TECHNIQUE: Contiguous axial images were obtained from the base of the skull through the vertex without intravenous contrast. COMPARISON:  Head CT 10/09/2017 FINDINGS: Brain: Remote right cerebellar infarct is again demonstrated with encephalomalacia. No CT findings for acute cerebral infarct and no intracranial hemorrhage. No extra-axial fluid collections. The gray-white differentiation is maintained. The ventricles are in the  midline without mass effect or shift. Vascular: No hyperdense vessel or unexpected calcification. Skull: No skull fractures or bone lesions. Sinuses/Orbits: Moderate mucoperiosteal thickening involving both maxillary sinuses and scattered ethmoid air cells. The mastoid air cells and middle ear cavities are clear. Other: No scalp lesions or hematoma. IMPRESSION: 1. Remote right cerebellar infarct. 2. No acute intracranial findings or mass lesions. 3. Paranasal sinus disease. Electronically Signed   By: Rudie Meyer M.D.   On: 03/17/2019 16:24   CT ANGIO CHEST PE W OR WO CONTRAST  Result Date: 03/17/2019 CLINICAL DATA:  Shortness of breath. Respiratory distress. Pulseless CPR. EXAM: CT ANGIOGRAPHY CHEST WITH CONTRAST TECHNIQUE: Multidetector CT imaging of the chest was performed using the standard protocol during bolus administration of intravenous contrast. Multiplanar CT image reconstructions and MIPs were obtained to evaluate the vascular anatomy. CONTRAST:  OMNIPAQUE IOHEXOL 350 MG/ML SOLN COMPARISON:  None. FINDINGS: Cardiovascular: The heart is normal in size. No pericardial effusion. The aorta is normal in caliber. No dissection. Moderate atherosclerotic calcifications at the aortic arch. No obvious coronary artery calcifications. The pulmonary arterial tree is fairly well opacified. No definite filling defects to suggest pulmonary embolism. Mediastinum/Nodes: No mediastinal or hilar mass or adenopathy. The esophagus is grossly normal. Lungs/Pleura: No acute pulmonary findings. No infiltrates, edema or effusions. No worrisome pulmonary lesions. Minimal dependent subpleural atelectasis/edema. Upper  Abdomen: No significant upper abdominal findings. Musculoskeletal: No chest wall mass, supraclavicular or axillary adenopathy. The thyroid gland appears normal. The bony thorax is intact. Review of the MIP images confirms the above findings. IMPRESSION: 1. No CT findings for pulmonary embolism. 2. Age  advanced atherosclerotic calcifications involving the thoracic aorta but no aneurysm or dissection. 3. No acute pulmonary findings. Aortic Atherosclerosis (ICD10-I70.0). Aortic Atherosclerosis (ICD10-I70.0). Electronically Signed   By: Rudie Meyer M.D.   On: 03/17/2019 16:30   DG Chest Portable 1 View  Result Date: 03/17/2019 CLINICAL DATA:  Status post cardiac arrest. EXAM: PORTABLE CHEST 1 VIEW COMPARISON:  Chest radiograph 01/10/2019 FINDINGS: Heart size within normal limits. No evidence of airspace consolidation within the lungs. The right lateral costophrenic angle is not entirely included the field of view. Within this limitation, no evidence of pleural effusion or pneumothorax. No acute bony abnormality identified. Tubing traverses the left neck, upper mediastinum, crosses midline and courses toward the right hilum. This may reflect a left-sided approach central venous catheter, although the tip position is not well delineated on the current exam. IMPRESSION: No evidence of acute cardiopulmonary abnormality. Tubing traverses the left neck, upper mediastinum, crosses midline and courses toward the right hilum. This may reflect a left-sided approach central venous catheter, although the tip position is not well delineated on the current exam. Electronically Signed   By: Jackey Loge DO   On: 03/17/2019 14:14     Shirlean Mylar, MD 03/18/2019, 7:26 AM PGY-1, [facility] FPTS Intern pager: [phone], text pages welcome

## 2019-03-18 NOTE — Consult Note (Signed)
CARDIOLOGY CONSULT NOTE  Patient ID: Robert Rich MRN: [id number] DOB/AGE: Aug 04, 1965 54 y.o.  Admit date: 03/17/2019 Referring Physician: Dr. Doralee Albino and his team Primary Physician: Rebecka Apley, NP Reason for Consultation:  Cardiac Arrest Preferred Emergency Contact: Robert Rich, Mother, [phone]  HPI:   54 y.o. Caucasian male  with prior history diabetes mellitus, hypertension, hyperlipidemia, tobacco use, alcohol use, and possible history of prior myocardial infarction and CVA per patient who now presents to the hospital secondary to PEA arrest.  At the time of the evaluation patient is sitting upright in his bedroom not accompanied by family members.  History of present illness is obtained as a part of a discussion with him and review of electronic medical records.  Patient states that he was fine when he woke up in the morning with a little bit of shortness of breath.  He went to work and did not feel right she goes back home.  He was having worsening shortness of breath and therefore started to use an inhaler but that did not help his symptoms overall.  His girlfriend called EMS for further evaluation during which time it appeared he had a cardiac arrest.  I spoke to the patient's next of kin which is his mom over the phone who states that the girlfriend did start CPR as per EMS dispatch recommendations.  Per electronic medical records patient received 1 mg of epinephrine, 2 g of magnesium, and ROSC achieved within 2 minutes of CPR by EMS.  Currently patient not having any chest pain.  However he states that at times he does have chest pain with effort related activities.  He describes it as 8 out of 10 in intensity, lasts for about 30 to 45 minutes, pressure-like sensation, better with resting and worsens with effort.  Patient states that he is never had a left heart catheterization and no prior coronary interventions according to him.  Past Medical History:   Diagnosis Date  . COPD (chronic obstructive pulmonary disease) (HCC)   . Coronary artery disease   . DM (diabetes mellitus) (HCC)   . Heart attack (HCC)   . HLD (hyperlipidemia)   . HTN (hypertension)   . Obesity   . Stroke [facility])      Past Surgical History:  Procedure Laterality Date  . NO PAST SURGERIES       Family History  Problem Relation Age of Onset  . Kidney disease Father      Social History: Social History   Tobacco Use  . Smoking status: Current Some Day Smoker    Packs/day: 0.10  . Smokeless tobacco: Never Used  Substance Use Topics  . Alcohol use: Yes    Alcohol/week: 12.0 standard drinks    Types: 12 Cans of beer per week    Comment: Weekly  . Drug use: Not Currently     Medications Prior to Admission  Medication Sig Dispense Refill Last Dose  . albuterol (VENTOLIN HFA) 108 (90 Base) MCG/ACT inhaler Inhale 2 puffs into the lungs every 6 (six) hours. (Patient taking differently: Inhale 2 puffs into the lungs every 6 (six) hours as needed for wheezing or shortness of breath. ) 6.7 g 0 unk at unk  . ALPRAZolam (XANAX) 0.5 MG tablet Take 0.5 mg by mouth 2 (two) times daily as needed for anxiety.    unk at unk  . sildenafil (REVATIO) 20 MG tablet Take 20-100 mg by mouth daily as needed (FOR E.D.).    unk at unk  .  amLODipine (NORVASC) 5 MG tablet Take 1 tablet (5 mg total) by mouth daily. (Patient not taking: Reported on 03/17/2019) 30 tablet 1 Not Taking at Unknown time  . azithromycin (ZITHROMAX) 500 MG tablet Take 1 tablet (500 mg total) by mouth daily. (Patient not taking: Reported on 03/17/2019) 4 tablet 0 Not Taking at Unknown time  . budesonide-formoterol (SYMBICORT) 160-4.5 MCG/ACT inhaler Inhale 2 puffs into the lungs 2 (two) times daily.   Not Taking at Unknown time  . dexamethasone (DECADRON) 6 MG tablet Take 1 tablet (6 mg total) by mouth daily. (Patient not taking: Reported on 03/17/2019) 10 tablet 0 Not Taking at Unknown time  . ipratropium (ATROVENT  HFA) 17 MCG/ACT inhaler Inhale 2 puffs into the lungs every 6 (six) hours. (Patient not taking: Reported on 03/17/2019) 1 Inhaler 0 Not Taking at Unknown time  . Ipratropium-Albuterol (COMBIVENT) 20-100 MCG/ACT AERS respimat Inhale 1 puff into the lungs every 6 (six) hours. (Patient not taking: Reported on 03/17/2019) 4 g 1 Not Taking at Unknown time  . ipratropium-albuterol (DUONEB) 0.5-2.5 (3) MG/3ML SOLN Inhale 3 mLs into the lungs 4 (four) times daily as needed (for shortness of breath or wheezing).    Not Taking at Unknown time  . predniSONE (DELTASONE) 50 MG tablet Take 1 tablet (50 mg total) by mouth daily. (Patient not taking: Reported on 03/17/2019) 5 tablet 0 Not Taking at Unknown time    Review of Systems  Constitution: Negative.  HENT: Negative for ear pain, hearing loss and sore throat.   Cardiovascular: Positive for chest pain. Negative for claudication, cyanosis, dyspnea on exertion, irregular heartbeat, orthopnea, palpitations, paroxysmal nocturnal dyspnea and syncope.  Respiratory: Positive for cough, shortness of breath, snoring and wheezing. Negative for hemoptysis.   Endocrine: Negative.   Hematologic/Lymphatic: Negative.   Skin: Negative.   Musculoskeletal: Negative for back pain, myalgias and stiffness.  Gastrointestinal: Negative for bloating, abdominal pain, constipation, diarrhea, hematemesis, hematochezia and hemorrhoids.  Genitourinary: Negative for decreased libido and dysuria.  Neurological: Positive for headaches. Negative for light-headedness, numbness, paresthesias and seizures.  Psychiatric/Behavioral: Negative for hallucinations and suicidal ideas. The patient is nervous/anxious. The patient does not have insomnia.       Physical Exam: Vitals with BMI 03/18/2019 03/17/2019 03/17/2019  Height - 5\' 9"  -  Weight - 194 lbs -  BMI - 28.64 -  Systolic 153 135  Diastolic 88 78 78  Pulse - 92 98   Physical Exam  Constitutional: He is oriented to person, place,  and time. He appears well-developed and well-nourished.  HENT:  Head: Normocephalic and atraumatic.  Eyes: Pupils are equal, round, and reactive to light. Right eye exhibits no discharge. Left eye exhibits no discharge. No scleral icterus.  Neck: No JVD present.  Cardiovascular: Normal rate and regular rhythm. Exam reveals no gallop and no friction rub.  No murmur heard. Pulmonary/Chest: No stridor. He has wheezes. He has no rales. He exhibits tenderness.  Abdominal: Soft. Bowel sounds are normal. He exhibits no distension. There is no abdominal tenderness. There is no rebound and no guarding.  Musculoskeletal:        General: Normal range of motion.     Cervical back: Normal range of motion.  Lymphadenopathy:    He has no cervical adenopathy.  Neurological: He is alert and oriented to person, place, and time. No cranial nerve deficit.  Skin: Skin is warm.     Labs:   Lab Results  Component Value Date   WBC 14.4 (H)  03/18/2019   HGB 13.4 03/18/2019   HCT 40.7 03/18/2019   MCV 89.6 03/18/2019   PLT 331 03/18/2019    Recent Labs  Lab 03/18/19 0006  NA 135  K 4.6  CL 104  CO2 21*  BUN 15  CREATININE 1.00  CALCIUM 8.5*  PROT 7.0  BILITOT 0.7  ALKPHOS 51  ALT 48*  AST 53*  GLUCOSE 282*    Lipid Panel     Component Value Date/Time   CHOL 185 03/18/2019 0006   TRIG 80 03/18/2019 0006   HDL 61 03/18/2019 0006   CHOLHDL 3.0 03/18/2019 0006   VLDL 16 03/18/2019 0006   LDLCALC 108 (H) 03/18/2019 0006    BNP (last 3 results) Recent Labs    12/06/18 1136 12/06/18 2047 03/18/19 0006  BNP 35.0 76.0 107.3*    HEMOGLOBIN A1C Lab Results  Component Value Date   HGBA1C 7.3 (H) 03/18/2019   MPG 162.81 03/18/2019    Cardiac Panel (last 3 results) Recent Labs    03/17/19 2022 03/18/19 0006 03/18/19 0244  CKTOTAL 535* 663* 668*    Lab Results  Component Value Date   CKTOTAL 668 (H) 03/18/2019     TSH Recent Labs    03/18/19 0006  TSH 0.487       Radiology: CT Head Wo Contrast  Result Date: 03/17/2019 CLINICAL DATA:  Respiratory distress. EXAM: CT HEAD WITHOUT CONTRAST TECHNIQUE: Contiguous axial images were obtained from the base of the skull through the vertex without intravenous contrast. COMPARISON:  Head CT 10/09/2017 FINDINGS: Brain: Remote right cerebellar infarct is again demonstrated with encephalomalacia. No CT findings for acute cerebral infarct and no intracranial hemorrhage. No extra-axial fluid collections. The gray-white differentiation is maintained. The ventricles are in the midline without mass effect or shift. Vascular: No hyperdense vessel or unexpected calcification. Skull: No skull fractures or bone lesions. Sinuses/Orbits: Moderate mucoperiosteal thickening involving both maxillary sinuses and scattered ethmoid air cells. The mastoid air cells and middle ear cavities are clear. Other: No scalp lesions or hematoma. IMPRESSION: 1. Remote right cerebellar infarct. 2. No acute intracranial findings or mass lesions. 3. Paranasal sinus disease. Electronically Signed   By: Rudie Meyer M.D.   On: 03/17/2019 16:24   CT ANGIO CHEST PE W OR WO CONTRAST  Result Date: 03/17/2019 CLINICAL DATA:  Shortness of breath. Respiratory distress. Pulseless CPR. EXAM: CT ANGIOGRAPHY CHEST WITH CONTRAST TECHNIQUE: Multidetector CT imaging of the chest was performed using the standard protocol during bolus administration of intravenous contrast. Multiplanar CT image reconstructions and MIPs were obtained to evaluate the vascular anatomy. CONTRAST:  OMNIPAQUE IOHEXOL 350 MG/ML SOLN COMPARISON:  None. FINDINGS: Cardiovascular: The heart is normal in size. No pericardial effusion. The aorta is normal in caliber. No dissection. Moderate atherosclerotic calcifications at the aortic arch. No obvious coronary artery calcifications. The pulmonary arterial tree is fairly well opacified. No definite filling defects to suggest pulmonary embolism.  Mediastinum/Nodes: No mediastinal or hilar mass or adenopathy. The esophagus is grossly normal. Lungs/Pleura: No acute pulmonary findings. No infiltrates, edema or effusions. No worrisome pulmonary lesions. Minimal dependent subpleural atelectasis/edema. Upper Abdomen: No significant upper abdominal findings. Musculoskeletal: No chest wall mass, supraclavicular or axillary adenopathy. The thyroid gland appears normal. The bony thorax is intact. Review of the MIP images confirms the above findings. IMPRESSION: 1. No CT findings for pulmonary embolism. 2. Age advanced atherosclerotic calcifications involving the thoracic aorta but no aneurysm or dissection. 3. No acute pulmonary findings. Aortic Atherosclerosis (ICD10-I70.0). Aortic  Atherosclerosis (ICD10-I70.0). Electronically Signed   By: Marijo Sanes M.D.   On: 03/17/2019 16:30   DG Chest Portable 1 View  Result Date: 03/17/2019 CLINICAL DATA:  Status post cardiac arrest. EXAM: PORTABLE CHEST 1 VIEW COMPARISON:  Chest radiograph 01/10/2019 FINDINGS: Heart size within normal limits. No evidence of airspace consolidation within the lungs. The right lateral costophrenic angle is not entirely included the field of view. Within this limitation, no evidence of pleural effusion or pneumothorax. No acute bony abnormality identified. Tubing traverses the left neck, upper mediastinum, crosses midline and courses toward the right hilum. This may reflect a left-sided approach central venous catheter, although the tip position is not well delineated on the current exam. IMPRESSION: No evidence of acute cardiopulmonary abnormality. Tubing traverses the left neck, upper mediastinum, crosses midline and courses toward the right hilum. This may reflect a left-sided approach central venous catheter, although the tip position is not well delineated on the current exam. Electronically Signed   By: Kellie Simmering DO   On: 03/17/2019 14:14    Scheduled Meds: . folic acid  1 mg  Oral Daily  . heparin  4,000 Units Intravenous Once  . insulin aspart  0-9 Units Subcutaneous Q4H  . multivitamin with minerals  1 tablet Oral Daily  . thiamine  100 mg Oral Daily   Or  . thiamine  100 mg Intravenous Daily   Continuous Infusions: . sodium chloride    . heparin     PRN Meds:.acetaminophen **OR** acetaminophen, albuterol, ipratropium-albuterol, LORazepam **OR** LORazepam  CARDIAC STUDIES:  EKG: EKG March 17, 2019 at 1402: Sinus tachycardia with a ventricular rate of 103 Bpm, left axis deviation, poor R wave progression, no underlying injury pattern. Prior EKG from 01/11/2019 shows normal sinus rhythm with a ventricular rate of 90 bpm QS patterns in lead V1 and V2 suggestive of old anteroseptal infarct.    Echocardiogram: Ordered, pending.  Assessment & Recommendations:  54 y.o. Caucasian male  with prior history diabetes mellitus, hypertension, hyperlipidemia, tobacco use, alcohol use, and possible history of prior myocardial infarction and CVA per patient who now presents to the hospital secondary to PEA arrest.  PEA cardiac arrest most likely secondary to respiratory distress from his underlying COPD:  EKG reviewed and does not show evidence of underlying ischemia or injury pattern.  Lab results reviewed.  Had a discussion with both the patient and his mom over the phone regarding undergoing a left heart catheterization given the recent PEA arrest and his prior symptoms of typical chest pain.  The left heart catheterization procedure was explained to the patient and his mom over the phone in the presence of patient's nurse Iris in details. The indication, alternatives, risks and benefits were reviewed. Complications including but not limited to bleeding, infection, acute kidney injury, blood transfusion, heart rhythm disturbances, contrast (dye) reaction, damage to the arteries or nerves in the legs or hands, cerebrovascular accident, myocardial infarction, need  for emergent bypass surgery, blood clots in the legs, possible need for emergent blood transfusion, and rarely death were reviewed and discussed with the patient. The patient and his mom voices understanding and wishes to proceed.   Patient is allergic to aspirin but states that his allergic reaction or hives.  No known respiratory distress.  Echo pending.  Continue telemetry.  Angina pectoris: See above  History of CVA, per patient:   Educated on importance of continued risk factor modifications and secondary prevention.  Acute on chronic exacerbation of COPD.  Diabetes  mellitus type 2.    Currently managed by primary team.  Most recent hemoglobin A1c reviewed.  Diabetes mellitus type 2 with hypertension:   Recommend initiation of ACE inhibitor's given the history of diabetes.  Recommend initiation of beta-blocker therapy  Currently managed by primary team.  Diabetes mellitus type 2 with mixed hyperlipidemia:  Recommend at least moderate intensity statin therapy as his estimated 10-year risk of ASCVD risk is greater than 10%.  LDL currently not at goal.  Tobacco use: Educated on complete smoking cessation.  Alcohol use: Educated on complete alcohol cessation.  Currently managed by primary team.  Patient's mom who is the next of kin has been updated with recommendations.  She is also asked to bring his list of medications and medication bottles with him next time when she visits.  Thank you to Dr. Leveda Anna and his team for allowing [place] to participate in care of this patient we will follow patient with you.  Teagen Mcleary [place], DO, [facility] 03/18/2019, 8:37 AM [facility] [place] Office: [phone]

## 2019-03-18 NOTE — Progress Notes (Signed)
Family called and given update. Would like to be called with any new information. Number in chart Matther Labell RN

## 2019-03-18 NOTE — Progress Notes (Signed)
  Echocardiogram 2D Echocardiogram w/ definity has been performed.  03/18/2019, 4:49 PM

## 2019-03-18 NOTE — Progress Notes (Signed)
I called and spoke to his mother Ms. Janet Berlin, updated her regarding the cardiac catheterization findings.  Also explained to her regarding smoking and COPD and probable etiology for his cardiac arrest being COPD exacerbation.  All questions answered.  [name], MD, [facility] 03/18/2019, 11:24 AM [facility] [place] Office: [phone]

## 2019-03-19 DIAGNOSIS — I469 Cardiac arrest, cause unspecified: Secondary | ICD-10-CM

## 2019-03-19 DIAGNOSIS — J441 Chronic obstructive pulmonary disease with (acute) exacerbation: Secondary | ICD-10-CM

## 2019-03-19 LAB — HEPATIC FUNCTION PANEL
ALT: 37 U/L (ref 0–44)
AST: 26 U/L (ref 15–41)
Albumin: 3.6 g/dL (ref 3.5–5.0)
Alkaline Phosphatase: 47 U/L (ref 38–126)
Bilirubin, Direct: <0.1 mg/dL (ref 0.0–0.2)
Total Bilirubin: 0.2 mg/dL — ABNORMAL LOW (ref 0.3–1.2)
Total Protein: 6.7 g/dL (ref 6.5–8.1)

## 2019-03-19 LAB — HCV INTERPRETATION

## 2019-03-19 LAB — BASIC METABOLIC PANEL
Anion gap: 12 (ref 5–15)
BUN: 20 mg/dL (ref 6–20)
CO2: 27 mmol/L (ref 22–32)
Calcium: 8.8 mg/dL — ABNORMAL LOW (ref 8.9–10.3)
Chloride: 97 mmol/L — ABNORMAL LOW (ref 98–111)
Creatinine, Ser: 0.97 mg/dL (ref 0.61–1.24)
GFR calc Af Amer: >60 mL/min (ref >60)
GFR calc non Af Amer: >60 mL/min (ref >60)
Glucose, Bld: 164 mg/dL — ABNORMAL HIGH (ref 70–99)
Potassium: 5 mmol/L (ref 3.5–5.1)
Sodium: 136 mmol/L (ref 135–145)

## 2019-03-19 LAB — GLUCOSE, CAPILLARY
Glucose-Capillary: 159 mg/dL — ABNORMAL HIGH (ref 70–99)
Glucose-Capillary: 176 mg/dL — ABNORMAL HIGH (ref 70–99)
Glucose-Capillary: 276 mg/dL — ABNORMAL HIGH (ref 70–99)
Glucose-Capillary: 292 mg/dL — ABNORMAL HIGH (ref 70–99)

## 2019-03-19 LAB — AMMONIA: Ammonia: 34 umol/L (ref 9–35)

## 2019-03-19 LAB — HCV AB W REFLEX TO QUANT PCR: HCV Ab: 0.1 s/co ratio (ref 0.0–0.9)

## 2019-03-19 LAB — LACTIC ACID, PLASMA
Lactic Acid, Venous: 1.7 mmol/L (ref 0.5–1.9)
Lactic Acid, Venous: 1.7 mmol/L (ref 0.5–1.9)

## 2019-03-19 LAB — CK: Total CK: 180 U/L (ref 49–397)

## 2019-03-19 MED ORDER — IPRATROPIUM-ALBUTEROL 0.5-2.5 (3) MG/3ML IN SOLN
3.0000 mL | Freq: Four times a day (QID) | RESPIRATORY_TRACT | Status: DC
  Filled 2019-03-19: qty 3

## 2019-03-19 MED ORDER — IPRATROPIUM-ALBUTEROL 0.5-2.5 (3) MG/3ML IN SOLN: 3.0000 mL | RESPIRATORY_TRACT | Status: DC | PRN

## 2019-03-19 MED ORDER — METOPROLOL SUCCINATE ER 25 MG PO TB24
12.5000 mg | ORAL_TABLET | Freq: Every day | ORAL | Status: DC
  Administered 2019-03-19 – 2019-03-20 (×2): 12.5 mg via ORAL
  Filled 2019-03-19 (×2): qty 1

## 2019-03-19 MED ORDER — AMLODIPINE BESYLATE 5 MG PO TABS
5.0000 mg | ORAL_TABLET | Freq: Every day | ORAL | Status: DC
  Administered 2019-03-19: 5 mg via ORAL
  Filled 2019-03-19: qty 1

## 2019-03-19 MED ORDER — METHYLPREDNISOLONE SODIUM SUCC 125 MG IJ SOLR
60.0000 mg | Freq: Two times a day (BID) | INTRAMUSCULAR | Status: AC
  Administered 2019-03-19 (×2): 60 mg via INTRAVENOUS
  Filled 2019-03-19 (×2): qty 2

## 2019-03-19 MED ORDER — PREDNISONE 20 MG PO TABS
40.0000 mg | ORAL_TABLET | Freq: Every day | ORAL | Status: DC
  Administered 2019-03-20: 40 mg via ORAL
  Filled 2019-03-19: qty 2

## 2019-03-19 MED ORDER — ENOXAPARIN SODIUM 40 MG/0.4ML ~~LOC~~ SOLN: 40.0000 mg | SUBCUTANEOUS | Status: DC

## 2019-03-19 MED ORDER — DOXYCYCLINE HYCLATE 100 MG PO TABS
100.0000 mg | ORAL_TABLET | Freq: Two times a day (BID) | ORAL | Status: DC
  Administered 2019-03-19 – 2019-03-20 (×3): 100 mg via ORAL
  Filled 2019-03-19 (×3): qty 1

## 2019-03-19 MED ORDER — IPRATROPIUM-ALBUTEROL 0.5-2.5 (3) MG/3ML IN SOLN: 3.0000 mL | RESPIRATORY_TRACT | Status: DC

## 2019-03-19 MED ORDER — IPRATROPIUM-ALBUTEROL 0.5-2.5 (3) MG/3ML IN SOLN
3.0000 mL | RESPIRATORY_TRACT | Status: DC
  Administered 2019-03-19 – 2019-03-20 (×4): 3 mL via RESPIRATORY_TRACT
  Filled 2019-03-19 (×4): qty 3

## 2019-03-19 NOTE — Progress Notes (Signed)
Physical Therapy Treatment Patient Details Name: Robert Rich MRN: [id number] DOB: 27-Feb-1965 Today's Date: 03/19/2019    History of Present Illness Pt is a 54 y.o. male admitted 03/17/19 with respiratory distress and brief PEA arrest (found down at home, CPR began by bystander, ROSC achieved within 2 min of CPR by EMS). CTA/CXR do not show  any acute cardiopulmonary process. CT head with no acute process. Going for cardiac cath 2/19. PMH includes COPD, CAD, CVA, (+) COVID-19 11/2018.    PT Comments    Pt is progressing towards goals. His cognition is improved from previous session. Pt continues to demonstrate instability with ambulation and would benefit from higher level balance training. Will continue to follow acutely.     Follow Up Recommendations  No PT follow up;Supervision for mobility/OOB     Equipment Recommendations  None recommended by PT    Recommendations for Other Services       Precautions / Restrictions Precautions Precautions: Fall Restrictions Weight Bearing Restrictions: No    Mobility  Bed Mobility Overal bed mobility: Modified Independent             General bed mobility comments: sittin EOB on arrival  Transfers Overall transfer level: Needs assistance Equipment used: None Transfers: Sit to/from Stand Sit to Stand: Min guard         General transfer comment: min guard for safety and balance  Ambulation/Gait Ambulation/Gait assistance: Min guard;Min assist Gait Distance (Feet): 300 Feet Assistive device: None Gait Pattern/deviations: Step-through pattern;Decreased stride length;Drifts right/left;Staggering right;Staggering left     General Gait Details: Pt with LOB after first few steps requring min A and increased time to recover before progressing to hallway ambulation. Noted swaying and instability thoughout gait. Cues for pursed lipped breathing as pt reported he could not breathe, however SpO2 remained >90% throughout  session.   Stairs             Wheelchair Mobility    Modified Rankin (Stroke Patients Only)       Balance Overall balance assessment: Needs assistance   Sitting balance-Leahy Scale: Fair     Standing balance support: No upper extremity supported Standing balance-Leahy Scale: Fair                              Cognition Arousal/Alertness: Awake/alert Behavior During Therapy: WFL for tasks assessed/performed Overall Cognitive Status: Within Functional Limits for tasks assessed                                 General Comments: Improved cognition today. pt is a&o x4. When given room number he was able to navagate back to room and use reasoning to decide which direction the numbers went.       Exercises      General Comments        Pertinent Vitals/Pain Pain Assessment: Faces Faces Pain Scale: Hurts little more Pain Location: Chest with deep breaths and activity Pain Descriptors / Indicators: Sore;Guarding Pain Intervention(s): Monitored during session;Limited activity within patient's tolerance;Repositioned    Home Living                      Prior Function            PT Goals (current goals can now be found in the care plan section) Acute Rehab PT Goals Patient Stated Goal: "Can I  go home today?" PT Goal Formulation: With patient Time For Goal Achievement: 04/01/19 Potential to Achieve Goals: Good Progress towards PT goals: Progressing toward goals    Frequency    Min 3X/week      PT Plan Current plan remains appropriate    Co-evaluation              AM-PAC PT "6 Clicks" Mobility   Outcome Measure  Help needed turning from your back to your side while in a flat bed without using bedrails?: None Help needed moving from lying on your back to sitting on the side of a flat bed without using bedrails?: None Help needed moving to and from a bed to a chair (including a wheelchair)?: A Little Help needed  standing up from a chair using your arms (e.g., wheelchair or bedside chair)?: A Little Help needed to walk in hospital room?: A Little Help needed climbing 3-5 steps with a railing? : A Little 6 Click Score: 20    End of Session Equipment Utilized During Treatment: Gait belt Activity Tolerance: Patient tolerated treatment well Patient left: with call bell/phone within reach;in chair Nurse Communication: Mobility status PT Visit Diagnosis: Other abnormalities of gait and mobility (R26.89)     Time: 0865-7846 PT Time Calculation (min) (ACUTE ONLY): 21 min  Charges:  $Gait Training: 8-22 mins                     Benjiman Core, [place] Pager [phone] Acute Rehab  Allena Katz 03/19/2019, 10:39 AM

## 2019-03-19 NOTE — Progress Notes (Signed)
Subjective:  C/O Chest pain at the compression site  Intake/Output from previous day:  I/O last 3 completed shifts: In: 4196 [P.O.:480; IV Piggyback:1100] Out: 3300 [Urine:3300] No intake/output data recorded.  Blood pressure 135/79, pulse 83, temperature 99 F (37.2 C), temperature source Oral, resp. rate 19, height 5' 9"  (1.753 m), weight 88 kg, SpO2 90 %. Physical Exam  Cardiovascular: Normal rate, regular rhythm, normal heart sounds and intact distal pulses. Exam reveals no gallop.  No murmur heard. No leg edema, no JVD.  Pulmonary/Chest: Effort normal. He has wheezes (diffuse and extensive). He has rales.  Abdominal: Soft. Bowel sounds are normal.  Skin: Skin is warm and dry.    Lab Results: BMP BNP (last 3 results) Recent Labs    12/06/18 1136 12/06/18 2047 03/18/19 0006  BNP 35.0 76.0 107.3*    ProBNP (last 3 results) No results for input(s): PROBNP in the last 8760 hours. BMP Latest Ref Rng & Units 03/19/2019 03/18/2019 03/17/2019  Glucose 70 - 99 mg/dL 164(H) 282(H) -  BUN 6 - 20 mg/dL 20 15 -  Creatinine 0.61 - 1.24 mg/dL 0.97 1.00 -  Sodium 135 - 145 mmol/L 136 135 137  Potassium 3.5 - 5.1 mmol/L 5.0 4.6 5.0  Chloride 98 - 111 mmol/L 97(L) 104 -  CO2 22 - 32 mmol/L 27 21(L) -  Calcium 8.9 - 10.3 mg/dL 8.8(L) 8.5(L) -   Hepatic Function Latest Ref Rng & Units 03/18/2019 03/17/2019 12/07/2018  Total Protein 6.5 - 8.1 g/dL 7.0 6.6 7.8  Albumin 3.5 - 5.0 g/dL 3.8 3.7 4.1  AST 15 - 41 U/L 53(H) 95(H) 25  ALT 0 - 44 U/L 48(H) 46(H) 30  Alk Phosphatase 38 - 126 U/L 51 60 63  Total Bilirubin 0.3 - 1.2 mg/dL 0.7 0.2(L) 0.4   CBC Latest Ref Rng & Units 03/18/2019 03/17/2019 03/17/2019  WBC 4.0 - 10.5 K/uL 14.4(H) - 13.7(H)  Hemoglobin 13.0 - 17.0 g/dL 13.4 15.6 14.6  Hematocrit 39.0 - 52.0 % 40.7 46.0 45.5  Platelets 150 - 400 K/uL 331 - 380   Lipid Panel     Component Value Date/Time   CHOL 185 03/18/2019 0006   TRIG 80 03/18/2019 0006   HDL 61 03/18/2019 0006    CHOLHDL 3.0 03/18/2019 0006   VLDL 16 03/18/2019 0006   LDLCALC 108 (H) 03/18/2019 0006   Cardiac Panel (last 3 results) Recent Labs    03/17/19 2022 03/18/19 0006 03/18/19 0244  CKTOTAL 535* 663* 668*    HEMOGLOBIN A1C Lab Results  Component Value Date   HGBA1C 7.3 (H) 03/18/2019   MPG 162.81 03/18/2019   TSH Recent Labs    03/18/19 0006  TSH 0.487   Imaging: Imaging results have been reviewed  Cardiac Studies:  Left Heart Catheterization 03/18/19:  LV normal LVEF grossly. EDP mardekly elevated Right coronary artery nondominant small, normal. Left main large, normal. Ramus intermediate large, normal. Circumflex large, dominant, normal. LAD ostial 50% stenosis.  FFR 0.89, not hemodynamically significant.  Impression: Patient developed significant respiratory distress probably combination of acute COPD exacerbation and also patient received intravenous adenosine for FFR evaluation.  Received albuterol inhalation and also oxygen supplementation with stabilization immediately.  Respiratory distress could also be related to combination of acute diastolic heart failure with markedly elevated EDP.  Patient also received IV Lasix.  Recommendation: No significant coronary disease, needs primary prevention, consider addition of Plavix as patient is allergic to aspirin in view of moderate coronary artery disease involving the ostial  LAD.  Primary prevention with aggressive control of diabetes, hypertension and hyperlipidemia is indicated.  Suspect is cardiac arrest was secondary to underlying COPD with respiratory distress and severe hypoxemia as evident by similar presentation in the cardiac catheterization lab where his sats dropped down to 70s.  Smoking cessation is indicated as well.  We will follow up on the echocardiogram.  1. Left ventricular ejection fraction, by estimation, is 55 to 60%. The  left ventricle has normal function. The left ventricle has no regional  wall  motion abnormalities. Left ventricular diastolic parameters were  normal.  2. Right ventricular systolic function is normal. The right ventricular  size is normal.  3. The inferior vena cava is dilated in size with >50% respiratory  variability, suggesting right atrial pressure of 8 mmHg.   Scheduled Meds: . atorvastatin  40 mg Oral q1800  . folic acid  1 mg Oral Daily  . heparin  5,000 Units Subcutaneous Q8H  . ibuprofen  400 mg Oral TID  . insulin aspart  0-9 Units Subcutaneous TID AC & HS  . ipratropium-albuterol  3 mL Nebulization QID  . lidocaine  1 patch Transdermal Q24H  . lisinopril  5 mg Oral Daily  . metoprolol tartrate  12.5 mg Oral BID  . multivitamin with minerals  1 tablet Oral Daily  . sodium chloride flush  3 mL Intravenous Q12H  . sodium chloride flush  3 mL Intravenous Q12H   Continuous Infusions: . sodium chloride    . thiamine injection 500 mg (03/19/19 0826)   Followed by  . [START ON 03/21/2019] thiamine injection     PRN Meds:.sodium chloride, acetaminophen **OR** acetaminophen, albuterol, LORazepam **OR** LORazepam, ondansetron (ZOFRAN) IV, sodium chloride flush  Assessment/Plan:  1.  Cardiac arrest/V. fib arrest secondary to acute respiratory failure 2.  Severe COPD and bronchospasm 3.  Coronary artery disease involving the ostial LAD of 50%, medical therapy 4.  Hyperlipidemia 5.  Tobacco use disorder  Recommendation: Medical therapy for CAD, avoid beta-blocker in view of severe bronchospasm, he needs more aggressive pulmonary toilet.  Probably consider pulmonary evaluation in view of acuity of presentation and cardiac arrest.  No cardiac etiology for his rhythm issues.  Continue high intensity statin, continue lisinopril and discontinue metoprolol and switch him to amlodipine for hypertension control.  Will sign off, call if questions.  I have discussed with the patient regarding smoking cessation and also lifestyle modification.  I also spoke to his  girlfriend over the telephone at the request of patient.   Adrian Prows, M.D. 03/19/2019, 9:01 AM [facility] Pager: [phone] Office: [phone] If no answer: [phone]

## 2019-03-19 NOTE — Discharge Summary (Addendum)
Family Medicine [facility] [employer] Discharge Summary  Patient name: Robert Rich Medical record number: [id number] Date of birth: January 25, 1966 Age: 54 y.o. Gender: male Date of Admission: 03/17/2019  Date of Discharge: March 20, 2019 Admitting Physician: Shirlean Mylar, MD  Primary Care Provider: Rebecka Apley, NP Consultants:  Cardiology  Indication for Hospitalization: Cardiac arrest  Discharge Diagnoses/Problem List:  COPD CAD History of CVA, left PICA infarct History of tobacco abuse COVID-19 positive in 11/2018 HLD Type 2 diabetes Alcohol use disorder Substance use disorder: Benzodiazepines, history of cocaine use  Disposition: To home  Discharge Condition: stable and improved  Discharge Exam:  Physical Exam: Gen: Alert and Oriented x 3, NAD CV: RRR, no murmurs, normal S1, S2 split Resp: mild diffuse wheezing, no rales, or rhonchi, comfortable work of breathing Ext: no clubbing, cyanosis, or edema Skin: warm, dry, intact, no rashes  Brief Hospital Course:  Patient had been drinking significant amount alcohol and took his Xanax on the morning of February 18, he began having shortness of breath and used his home nebulizer around noon.  Shortly thereafter he was found by a friend to be down, blue, still with nebulizer tubing connected to his mouth.  CPR was started, EMS called, EMS proceeded with CPR, and patient received 1 mg epinephrine, magnesium.  Patient was identified to be having PEA arrest.  A King airway was placed using fentanyl and Versed for sedation.  After 2 minutes of CPR ROSC was achieved, patient transferred to hospital.  By the time he arrived at the hospital, he was alert, not oriented, and fighting the airway.  King airway was removed and patient appropriately protected his airway, but he was alert and oriented only to person and year.  He appeared very confused for a day.  By February 20, patient was alert and oriented x4.    Possible  cardiac etiology as cause for cardiac arrest was investigated, heart catheterization revealed normal LV, normal LVEF (55 to 60%), markedly elevated end-diastolic pressure, and only 50% stenosed ostial LAD, nonobstructing.  Echo with similar results, normal LV, normal LVEF of 55 to 60%, no wall motion abnormalities.  Cardiology recommends aggressive primary prevention by controlling hypertension, diabetes, COPD, stopping smoking, alcohol cessation.  Patient has mild allergy to aspirin (rash), so cardiology recommends considering adding Plavix as antiplatelet therapy.  Patient has history of COPD, and while receiving anesthesia for heart catheterization patient had a bronchospasm and sats dropped to 70%.  He immediately recovered, but due to this evidence, as well as significant wheezing on exam, and grossly normal heart catheterization, there is high suspicion that this cardiac arrest was due to respiratory failure in combination with alcohol and benzodiazepine intoxication.  Treated for COPD exacerbation: Solu-Medrol, prednisone taper, duo nebs, albuterol, doxycycline 100 mg twice daily x 5 days.  History of CVA: Patient has ASCVD risk of 20% in the next 10 years, recommend a moderate to high intensity statin.  Patient was placed on atorvastatin 40 mg daily.  Issues for Follow Up:  1. Cardiology: Continue lisinopril, atorvastatin, metoprolol, and Plavix 2. COPD: Prednisone taper, doxycyclin for total of 5 days. Please start taking Anorro Ellipta every day as precribed 3. Smoking cessation - we STRONGLY encourage you to stop smoking. This is the most important change you can make to have a positive health outcome 4. Alcohol use disorder, we encourage you decrease your alcohol intake to the recommended levels of less than 2 beers per day or less than 6 per week 5.  As patient has been not using benzodiazepines correctly, and they had a role to play in his cardiac arrest, recommend continuing to wean down by  10% every 7 days. 6. Per Cardiology, he should not drive or operate any heavy machinery for 6 months  Significant Procedures: Heart catheterization  Significant Labs and Imaging:  Recent Labs  Lab 03/17/19 1400 03/17/19 1400 03/17/19 1457 03/18/19 0006 03/20/19 0209  WBC 13.7*  --   --  14.4* 13.2*  13.1*  HGB 14.6   < > 15.6 13.4 14.1  14.0  HCT 45.5   < > 46.0 40.7 42.4  43.1  PLT 380  --   --  331 308  348   < > = values in this interval not displayed.   Recent Labs  Lab 03/17/19 1400 03/17/19 1400 03/17/19 1457 03/17/19 1457 03/17/19 2022 03/18/19 0006 03/18/19 0006 03/19/19 0049 03/19/19 0950 03/20/19 0209  NA 140  --  137  --   --  135  --  136  --  136  K 5.3*   < > 5.0   < >  --  4.6   < > 5.0  --  4.4  CL 104  --   --   --   --  104  --  97*  --  97*  CO2 18*  --   --   --   --  21*  --  27  --  26  GLUCOSE 250*  --   --   --   --  282*  --  164*  --  261*  BUN 15  --   --   --   --  15  --  20  --  21*  CREATININE 0.97  --   --   --   --  1.00  --  0.97  --  0.78  CALCIUM 8.2*  --   --   --   --  8.5*  --  8.8*  --  9.4  MG  --   --   --   --  2.2 2.1  --   --   --   --   PHOS  --   --   --   --   --  2.9  --   --   --   --   ALKPHOS 60  --   --   --   --  51  --   --  47 49  AST 95*  --   --   --   --  53*  --   --  26 18  ALT 46*  --   --   --   --  48*  --   --  37 34  ALBUMIN 3.7  --   --   --   --  3.8  --   --  3.6 3.6   < > = values in this interval not displayed.   Left Heart Catheterization 03/18/19:  LV normal LVEF grossly. EDP mardekly elevated Right coronary artery nondominant small, normal. Left main large, normal. Ramus intermediate large, normal. Circumflex large, dominant, normal. LAD ostial 50% stenosis.  FFR 0.89, not hemodynamically significant.  Impression: Patient developed significant respiratory distress probably combination of acute COPD exacerbation and also patient received intravenous adenosine for FFR evaluation.   Received albuterol inhalation and also oxygen supplementation with stabilization immediately.  Respiratory distress could also be related to combination of acute  diastolic heart failure with markedly elevated EDP.  Patient also received IV Lasix.  Recommendation: No significant coronary disease, needs primary prevention, consider addition of Plavix as patient is allergic to aspirin in view of moderate coronary artery disease involving the ostial LAD.  Primary prevention with aggressive control of diabetes, hypertension and hyperlipidemia is indicated.  Suspect is cardiac arrest was secondary to underlying COPD with respiratory distress and severe hypoxemia as evident by similar presentation in the cardiac catheterization lab where his sats dropped down to 70s.  Smoking cessation is indicated as well.  We will follow up on the echocardiogram.  Procedure: 2D Echo, Cardiac Doppler, Color Doppler and Intracardiac       Opacification Agent  03/18/2019 1. Left ventricular ejection fraction, by estimation, is 55 to 60%. The  left ventricle has normal function. The left ventricle has no regional  wall motion abnormalities. Left ventricular diastolic parameters were  normal.  2. Right ventricular systolic function is normal. The right ventricular  size is normal.  3. The inferior vena cava is dilated in size with >50% respiratory  variability, suggesting right atrial pressure of 8 mmHg.   Conclusion(s)/Recomendation(s): Normal biventricular function without  evidence of hemodynamically significant valvular heart disease.   CT Angiography Chest with contrast 03/17/2019 IMPRESSION: 1. No CT findings for pulmonary embolism. 2. Age advanced atherosclerotic calcifications involving the thoracic aorta but no aneurysm or dissection. 3. No acute pulmonary findings.  CT Head without contrast 03/17/2019 IMPRESSION: 1. Remote right cerebellar infarct. 2. No acute intracranial findings or mass  lesions. 3. Paranasal sinus disease.  Results/Tests Pending at Time of Discharge: none  Discharge Medications:  Allergies as of 03/20/2019      Reactions   Zolmitriptan Nausea And Vomiting, Other (See Comments)   Severe headaches   Asa [aspirin] Hives      Medication List    STOP taking these medications   amLODipine 5 MG tablet Commonly known as: NORVASC   azithromycin 500 MG tablet Commonly known as: ZITHROMAX   dexamethasone 6 MG tablet Commonly known as: DECADRON   ipratropium 17 MCG/ACT inhaler Commonly known as: ATROVENT HFA   Symbicort 160-4.5 MCG/ACT inhaler Generic drug: budesonide-formoterol     TAKE these medications   albuterol 108 (90 Base) MCG/ACT inhaler Commonly known as: VENTOLIN HFA Inhale 2 puffs into the lungs every 6 (six) hours. What changed:   when to take this  reasons to take this Notes to patient: 03/20/19 Evening   ALPRAZolam 0.5 MG tablet Commonly known as: XANAX Take 0.5 mg by mouth 2 (two) times daily as needed for anxiety.   atorvastatin 40 MG tablet Commonly known as: LIPITOR Take 1 tablet (40 mg total) by mouth daily at 6 PM. Notes to patient: 03/20/19 6 PM   clopidogrel 75 MG tablet Commonly known as: PLAVIX Take 1 tablet (75 mg total) by mouth daily. Start taking on: March 21, 2019 Notes to patient: 03/21/19 Morning   doxycycline 100 MG tablet Commonly known as: VIBRA-TABS Take 1 tablet (100 mg total) by mouth every 12 (twelve) hours. Notes to patient: 03/20/19 Evening 10 PM   folic acid 1 MG tablet Commonly known as: FOLVITE Take 1 tablet (1 mg total) by mouth daily. Start taking on: March 21, 2019 Notes to patient: 03/21/19 Morning   ipratropium-albuterol 0.5-2.5 (3) MG/3ML Soln Commonly known as: DUONEB Inhale 3 mLs into the lungs 4 (four) times daily as needed (for shortness of breath or wheezing).   Ipratropium-Albuterol 20-100 MCG/ACT Aers respimat  Commonly known as: COMBIVENT Inhale 1 puff into the  lungs every 6 (six) hours. Notes to patient: 03/20/19 Evening   lisinopril 5 MG tablet Commonly known as: ZESTRIL Take 1 tablet (5 mg total) by mouth daily. Start taking on: March 21, 2019 Notes to patient: 03/21/19 Morning   metFORMIN 500 MG tablet Commonly known as: Glucophage Take 1 tablet (500 mg total) by mouth 2 (two) times daily with a meal. Notes to patient: 03/20/19 with supper   metoprolol succinate 25 MG 24 hr tablet Commonly known as: TOPROL-XL Take 0.5 tablets (12.5 mg total) by mouth daily. Start taking on: March 21, 2019 Notes to patient: 03/21/19 Morning   naproxen 500 MG tablet Commonly known as: Naprosyn Take 1 tablet (500 mg total) by mouth 2 (two) times daily with a meal. Notes to patient: 03/20/19 Evening   predniSONE 20 MG tablet Commonly known as: DELTASONE Take 2 tablets (40 mg total) by mouth daily with breakfast. Start taking on: March 21, 2019 What changed:   medication strength  how much to take  when to take this Notes to patient: 03/21/19 Morning   sildenafil 20 MG tablet Commonly known as: REVATIO Take 20-100 mg by mouth daily as needed (FOR E.D.).   thiamine 100 MG tablet Take 2.5 tablets (250 mg total) by mouth daily. Notes to patient: 03/21/19 Morning   umeclidinium-vilanterol 62.5-25 MCG/INH Aepb Commonly known as: ANORO ELLIPTA Inhale 1 puff into the lungs daily. Notes to patient: 03/21/19 Morning       Discharge Instructions: Please refer to Patient Instructions section of EMR for full details.  Patient was counseled important signs and symptoms that should prompt return to medical care, changes in medications, dietary instructions, activity restrictions, and follow up appointments.   Follow-Up Appointments: Follow-up Information    Hemberg, Ruby Cola, NP. Schedule an appointment as soon as possible for a visit in 1 week(s).   Specialty: Adult Health Nurse Practitioner Why: Please make sure you follow up with your PCP  in one week. Contact information: [address] [place] [address] [phone]           Shirlean Mylar, MD 03/20/2019, 8:12 PM PGY-1, [facility]  Resident Attestation   I saw and evaluated the patient, performing the key elements of the service. I personally performed or re-performed the history, physical exam, and medical decision making activities of this service and have verified that the service and findings are accurately documented in the resident's note. I developed the management plan that is described in the resident's note, and I agree with the content, with my edits above in red.   Jules Schick, DO [facility], PGY-3

## 2019-03-19 NOTE — Progress Notes (Signed)
Family Medicine Teaching Service Daily Progress Note Intern Pager: [phone]  Patient name: Robert Rich Medical record number: [id number] Date of birth: 1965/04/07 Age: 54 y.o. Gender: male  Primary Care Provider: Rebecka Apley, NP Consultants: cardiology Code Status: Full  Pt Overview and Major Events to Date:  2/18: PEA arrest with ROSC, admitted 2/19: heart cath, echo  Assessment and Plan: Robert Rich is a 54 y.o. male presenting with respiratory distress and brief PEA arrest. PMH is significant for COPD, coronary disease, history CVA, h/o tobacco use, COVID-19 positive in 11/2018.  Brief PEA arrest Heart chatheterization yesterday very reassuring: no significant CAD, LV and LVEF grossly normal, EDP markedly elevated, LAD ostial with 50% stenosis. Cardiology recommends primary prevention with aggressive control of DM, HTN, and HLD, recommend smoking cessation, consider plavix as patient is allergic to ASA and has some stenosis in ostial LAD. Echo with similar results: LVEF 55-60% with no regional wall motion abnormalities. R ventricular systolic function normal. CK decreased from 668 to 180, and LA decreased from 4.9 to 1.7. Given this information and the fact that patient desatted to 70s while under anesthesia in the cath lab, strongly suspect cardiac arrest was due to underlying COPD and respiratory distress, probably worsened by alcohol and benzodiazepine intoxication.  Further cardiac recommendations include: beta blocker, ACEI/ARB, and statin due to ASCVD>10%, see below. -Consult cardiology, appreciate recommendations -Continuous cardiac monitoring -Continue lisinopril, and atorvastatin  -Discontinue amlodipine -Start metoprolol 12.5 mg PO daily -Daily CMP, CBC, CK, LA trend -Heart healthy/carb modified diet  -Lovenox for DVT prophylaxis -PT/OT eval and treat  Acute hypoxic respiratory failure  COPD Overnight patient's respiratory status improved, patient is  currently satting 90-92% on RA. COPD exacerbation, smoking, and EtOH/benzodiazipine intake most likely cause of cardiac arrest. Patient is currently stable on room air, however pulmonary exam is significant for wheezing in bilateral lung fields. CXR from admission with no acute findings. CTA of lungs on admission with "minimal dependent subpleural atelectasis/edema." Patient is AOx4 today and states that he quit smoking 3 weeks ago and this is further impetus for him to stay off cigarettes. Will discuss smoking and alcohol cessation in further detail. Patient will likely need to be followed by pulmonology as an outpatient for PFTs. - Continuous pulse oximetry - Incentive spirometry - O2 therapy: maintain SpO2 to 88-92% via [place] - Albuterol neb q2h PRN wheezing - Douneb q4h  - Solumedrol 60 mg IV BID today, switch to oral prednisone tomorrow - Doxycycline 100 mg BID x 5d  Substance use disorder  possible alcohol withdrawal CIWAs overnight 4>2>9; patient did not receive any lorazepam. Ammonia WNL at 34. Given the lack of EtOH in his system and possible daily alcohol use along with tremulousness and psychomotor agitation there is concern for alcohol withdrawal, likely patient has finished withdrawing and I do not expect CIWA >5 tonight, but we will continue to follow this. Patient was working with PT when I arrived and we will discuss EtOH cessation in more depth later, however, patient is highly motivated to stop drinking. He is A&Ox4 today, like a completely new man, very different than on the day of admission. Cognition appears completely normal.  -Thiamine, folic acid, multivitamin -Consult social work for possible community resources for substance use disorder  DMT2 CBG from 226 to 159 overnight, received 8U aspart. Patient did receive one dose of solumedrol in ED on 2/18. A1c on admission 7.3%. Plan to start metformin 500 mg prior to DC. -Daily BMP   -  SSI  Transaminitis-resolved AST/ALT  improved to 26, 37 respectively today.  AST/ALT 95, 46 on admission. Hepatitis C ab negative. Patient had elevated AST/ALT in September 2015 to 51 and 67 respectively, at which time hepatitis work-up was negative for hepatitis A, B, or C.  Concern for alcohol withdrawal on admission, CIWA's in place, see above for polysubstance use for treatment.   Hypertension BP's range from normo to hypertensive: SBP 115-179, DBP 79-124, most recently 135/79. Patient previously on verapamil, but has not taken in many months. Started amlodipine 5 mg daily, lisinopril 5 mg daily. Plan to add beta blocker today per cardiology recommendations. -Continue to monitor - start lisinopril 5 mg daily - start metoprolol 12.5 mg daily - Discontinue amlodipine  HLD Lipid panel: Total cholesterol 185, HDL 61, LDL 108, triglycerides 80. ASCVD 10 year risk 20%, recommend at least moderate-intensity statin. Patient with elevated lipids since 2015 and on a statin since CVA in 2016.  Most recently taking Crestor 5 mg charted in August 2020.  However according to pharmacist tech prescription review, patient has not picked up any prescription other than Ativan.  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  Patient previously was not on secondary prevention prior to this.  Afterwards he was on Plavix and Lipitor 80.  The time of the stroke, echocardiogram with Doppler and bubble study was obtained in October 2016 that showed no cardiac etiology for the stroke. - Recommend PCP consider plavix d/t ASA allergy  FEN/GI: Heart healthy/carb modified PPx: Lovenox  Disposition: to progressive pending medical stabilization and work up  Subjective:  Patient is alert and oriented x4 today, like a new man.  His cognition appears completely normal.  He states that he quit smoking 3 weeks ago, and now he is even more motivated to stay stopped.   He also reports that he wishes to stop drinking alcohol and is very motivated to do so.  Objective: Temp:  [98 F (36.7 C)-99 F (37.2 C)] 99 F (37.2 C) (02/20 0729) Pulse Rate:  [69-113] 83 (02/20 0729) Resp:  [10-33] 19 (02/20 0729) BP: (115-179)/(79-124) 135/79 (02/20 0729) SpO2:  [76 %-97 %] 90 % (02/20 0729) Physical Exam: General: Overweight, middle-aged man sitting up comfortably in bed in no acute distress Cardiovascular: RRR, no M/R/G Respiratory: Crackles present in bilateral mid and lower lung fields Abdomen: Soft, nontender, nondistended, normal bowel sounds present Extremities: No lower extremity edema  Laboratory: Recent Labs  Lab 03/17/19 1400 03/17/19 1457 03/18/19 0006  WBC 13.7*  --  14.4*  HGB 14.6 15.6 13.4  HCT 45.5 46.0 40.7  PLT 380  --  331   Recent Labs  Lab 03/17/19 1400 03/17/19 1400 03/17/19 1457 03/18/19 0006 03/19/19 0049  NA 140   < > 137 135 136  K 5.3*   < > 5.0 4.6 5.0  CL 104  --   --  104 97*  CO2 18*  --   --  21* 27  BUN 15  --   --  15 20  CREATININE 0.97  --   --  1.00 0.97  CALCIUM 8.2*  --   --  8.5* 8.8*  PROT 6.6  --   --  7.0  --   BILITOT 0.2*  --   --  0.7  --   ALKPHOS 60  --   --  51  --   ALT 46*  --   --  48*  --   AST 95*  --   --  53*  --   GLUCOSE 250*  --   --  282* 164*   < > = values in this interval not displayed.   Imaging/Diagnostic Tests: CARDIAC CATHETERIZATION  Result Date: 03/18/2019  Ost LAD lesion is 50% stenosed.  The left ventricular systolic function is normal.  LV end diastolic pressure is severely elevated.  Left Heart Catheterization 03/18/19: LV normal LVEF grossly. EDP mardekly elevated Right coronary artery nondominant small, normal. Left main large, normal. Ramus intermediate large, normal. Circumflex large, dominant, normal. LAD ostial 50% stenosis.  FFR 0.89, not hemodynamically significant. Impression: Patient developed significant respiratory distress probably combination of acute  COPD exacerbation and also patient received intravenous adenosine for FFR evaluation.  Received albuterol inhalation and also oxygen supplementation with stabilization immediately.  Respiratory distress could also be related to combination of acute diastolic heart failure with markedly elevated EDP.  Patient also received IV Lasix. Recommendation: No significant coronary disease, needs primary prevention, consider addition of Plavix as patient is allergic to aspirin in view of moderate coronary artery disease involving the ostial LAD.  Primary prevention with aggressive control of diabetes, hypertension and hyperlipidemia is indicated.  Suspect is cardiac arrest was secondary to underlying COPD with respiratory distress and severe hypoxemia as evident by similar presentation in the cardiac catheterization lab where his sats dropped down to 70s.  Smoking cessation is indicated as well.  We will follow up on the echocardiogram.   ECHOCARDIOGRAM COMPLETE  Result Date: 03/18/2019    ECHOCARDIOGRAM REPORT   Patient Name:   Robert Rich Date of Exam: 03/18/2019 Medical Rec #:  [id number]      Height:       69.0 in Accession #:    [id number]     Weight:       194.0 lb Date of Birth:  [birth year]      BSA:          2.04 m Patient Age:    54 years       BP:           164/100 mmHg Patient Gender: M              HR:           82 bpm. Exam Location:  Inpatient Procedure: 2D Echo, Cardiac Doppler, Color Doppler and Intracardiac            Opacification Agent Indications:     Acute myocardial infarction 410  History:         Patient has no prior history of Echocardiogram examinations.                  COPD; Risk Factors:Tobacco abuse. Cardiac arrest.  Sonographer:     Ross Ludwig RDCS (AE) Referring Phys:  [id number] Pricilla Loveless Diagnosing Phys: [name] MD  Sonographer Comments: Technically difficult study due to poor echo windows. Image acquisition challenging due to patient body habitus. IMPRESSIONS  1. Left ventricular  ejection fraction, by estimation, is 55 to 60%. The left ventricle has normal function. The left ventricle has no regional wall motion abnormalities. Left ventricular diastolic parameters were normal.  2. Right ventricular systolic function is normal. The right ventricular size is normal.  3. The inferior vena cava is dilated in size with >50% respiratory variability, suggesting right atrial pressure of 8 mmHg. Conclusion(s)/Recomendation(s): Normal biventricular function without evidence of hemodynamically significant valvular heart disease. FINDINGS  Left Ventricle: Left ventricular ejection fraction, by estimation, is 55 to 60%. The left ventricle has normal function. The left ventricle has no regional wall motion abnormalities. Definity contrast agent was given IV to delineate the left ventricular  endocardial borders. The left ventricular internal cavity size was normal in size. There is no left ventricular hypertrophy. Left ventricular diastolic parameters were normal. Right Ventricle: The right ventricular size is normal. No increase in right ventricular wall thickness. Right ventricular systolic function is normal. Left Atrium: Left atrial size was normal in size. Right Atrium: Right atrial size was normal in size. Pericardium: There is no evidence of pericardial effusion. Mitral Valve: The mitral valve is normal in structure and function. Normal mobility of the mitral valve leaflets. Trivial mitral valve regurgitation. No evidence of mitral valve stenosis. MV peak gradient, 2.5 mmHg. The mean mitral valve gradient is 1.0 mmHg. Tricuspid Valve: The tricuspid valve is normal in structure. Tricuspid valve regurgitation is trivial. No evidence of tricuspid stenosis. Aortic Valve: The aortic valve is normal in structure and function. Aortic valve regurgitation is not visualized. No aortic stenosis is present. Aortic valve mean gradient measures 5.0 mmHg. Aortic valve peak gradient measures 10.0 mmHg. Aortic  valve area, by VTI measures 2.84 cm. Pulmonic Valve: The pulmonic valve was normal in structure. Pulmonic valve regurgitation is not visualized. No evidence of pulmonic stenosis. Aorta: The aortic root is normal in size and structure. Venous: The inferior vena cava is dilated in size with greater than 50% respiratory variability, suggesting right atrial pressure of 8 mmHg. IAS/Shunts: No atrial level shunt detected by color flow Doppler.  LEFT VENTRICLE PLAX 2D LVIDd:         5.00 cm  Diastology LVIDs:         3.50 cm  LV e' lateral:   8.38 cm/s LV PW:         1.30 cm  LV E/e' lateral: 9.4 LV IVS:        1.40 cm  LV e' medial:    7.18 cm/s LVOT diam:     2.00 cm  LV E/e' medial:  10.9 LV SV:         82.94 ml LV SV Index:   32.24 LVOT Area:     3.14 cm  RIGHT VENTRICLE             IVC RV Basal diam:  3.20 cm     IVC diam: 2.20 cm RV S prime:     14.30 cm/s TAPSE (M-mode): 2.6 cm LEFT ATRIUM             Index       RIGHT ATRIUM           Index LA diam:        3.20 cm 1.57 cm/m  RA Area:     15.90 cm LA Vol (A2C):   68.5 ml 33.59 ml/m RA Volume:   39.00 ml  19.12 ml/m LA Vol (A4C):   49.5 ml 24.27 ml/m LA Biplane Vol: 58.5 ml 28.68 ml/m  AORTIC VALVE AV Area (Vmax):    2.62 cm AV Area (Vmean):   2.60 cm AV Area (VTI):     2.84 cm AV Vmax:           158.00 cm/s AV Vmean:          110.000 cm/s AV VTI:            0.292 m AV Peak Grad:  10.0 mmHg AV Mean Grad:      5.0 mmHg LVOT Vmax:         132.00 cm/s LVOT Vmean:        90.900 cm/s LVOT VTI:          0.264 m LVOT/AV VTI ratio: 0.90  AORTA Ao Root diam: 3.20 cm Ao Asc diam:  3.40 cm MITRAL VALVE MV Area (PHT): 3.42 cm    SHUNTS MV Peak grad:  2.5 mmHg    Systemic VTI:  0.26 m MV Mean grad:  1.0 mmHg    Systemic Diam: 2.00 cm MV Vmax:       0.80 m/s MV Vmean:      47.5 cm/s MV Decel Time: 222 msec MV E velocity: 78.60 cm/s MV A velocity: 86.15 cm/s MV E/A ratio:  0.91 [name] MD Electronically signed by [name] MD Signature Date/Time: 03/18/2019/5:21:48  PM    Final      Shirlean Mylar, MD 03/19/2019, 9:00 AM PGY-1, [facility] Family Medicine FPTS Intern pager: [phone], text pages welcome

## 2019-03-19 NOTE — Progress Notes (Signed)
Pt ambulated in a hallway without SOB and difficulty, still having some chest pain covered with pain medicines, updated his mother over phone, CIWA is 0-3 whole day, Lovenox will be started from tonight since he got two dose of heparin already, consulted pharmacist for timing, will continue to monitor the patient  Lonia Farber, RN

## 2019-03-19 NOTE — Evaluation (Signed)
Occupational Therapy Evaluation Patient Details Name: Robert Rich MRN: [id number] DOB: 04-Jul-1965 Today's Date: 03/19/2019    History of Present Illness Pt is a 54 y.o. male admitted 03/17/19 with respiratory distress and brief PEA arrest (found down at home, CPR began by bystander, ROSC achieved within 2 min of CPR by EMS). CTA/CXR do not show  any acute cardiopulmonary process. CT head with no acute process. Going for cardiac cath 2/19. PMH includes COPD, CAD, CVA, (+) COVID-19 11/2018.   Clinical Impression   PTA pt independent, owns body shop. At time of eval, he is min guard for sit <> stands and functional mobility. He presents with some higher level cognitive deficits as described below. He complains of DOE this date with pain in chest. Educated pt on sternal precautions for comfort. O2 sats stable, but pt states he usually takes inhaler at home to manage COPD. Noted he had breathing treatment earlier, but did not recall. Pt will benefit from continued acute OT for ECS training. No OT f/u indicated post acute.    Follow Up Recommendations  No OT follow up    Equipment Recommendations  None recommended by OT    Recommendations for Other Services       Precautions / Restrictions Precautions Precautions: Fall Restrictions Weight Bearing Restrictions: No      Mobility Bed Mobility               General bed mobility comments: up in chair  Transfers Overall transfer level: Needs assistance Equipment used: None Transfers: Sit to/from Stand Sit to Stand: Min guard         General transfer comment: min guard for safety and balance    Balance Overall balance assessment: Needs assistance   Sitting balance-Leahy Scale: Fair     Standing balance support: No upper extremity supported Standing balance-Leahy Scale: Fair                             ADL either performed or assessed with clinical judgement   ADL Overall ADL's : Modified independent                                       General ADL Comments: pt is close to baseline for BADLs, but needing increased time 2/2 decreased activity tolerance with DOE and chest pain post compressions. Educated on sternal precautions for comfort     Vision Patient Visual Report: No change from baseline Vision Assessment?: No apparent visual deficits     Perception     Praxis      Pertinent Vitals/Pain Pain Assessment: Faces Faces Pain Scale: Hurts little more Pain Location: Chest with deep breaths and activity Pain Descriptors / Indicators: Sore;Guarding Pain Intervention(s): Monitored during session;Limited activity within patient's tolerance;Repositioned     Hand Dominance     Extremity/Trunk Assessment Upper Extremity Assessment Upper Extremity Assessment: Overall WFL for tasks assessed   Lower Extremity Assessment Lower Extremity Assessment: Defer to PT evaluation       Communication Communication Communication: No difficulties   Cognition Arousal/Alertness: Awake/alert Behavior During Therapy: WFL for tasks assessed/performed Overall Cognitive Status: Within Functional Limits for tasks assessed Area of Impairment: Attention;Memory                   Current Attention Level: Selective Memory: Decreased short-term memory  General Comments: improved cognition from chart review, still having some recall and attention deficits   General Comments       Exercises     Shoulder Instructions      Home Living Family/patient expects to be discharged to:: Private residence Living Arrangements: Spouse/significant other Available Help at Discharge: Family;Friend(s);Available PRN/intermittently Type of Home: House Home Access: Level entry     Home Layout: One level     Bathroom Shower/Tub: Teacher, early years/pre: Standard     Home Equipment: None          Prior Functioning/Environment Level of Independence:  Independent        Comments: Independent, drives, owns car body shop        OT Problem List: Decreased knowledge of precautions;Decreased activity tolerance;Cardiopulmonary status limiting activity;Impaired balance (sitting and/or standing);Decreased safety awareness      OT Treatment/Interventions: Self-care/ADL training;Patient/family education;Therapeutic exercise;Balance training;Energy conservation;Therapeutic activities;DME and/or AE instruction    OT Goals(Current goals can be found in the care plan section) Acute Rehab OT Goals Patient Stated Goal: going home today OT Goal Formulation: With patient Time For Goal Achievement: 04/02/19 Potential to Achieve Goals: Good  OT Frequency: Min 2X/week   Barriers to D/C:            Co-evaluation              AM-PAC OT "6 Clicks" Daily Activity     Outcome Measure Help from another person eating meals?: None Help from another person taking care of personal grooming?: None Help from another person toileting, which includes using toliet, bedpan, or urinal?: A Little Help from another person bathing (including washing, rinsing, drying)?: A Little Help from another person to put on and taking off regular upper body clothing?: None Help from another person to put on and taking off regular lower body clothing?: A Little 6 Click Score: 21   End of Session Nurse Communication: Mobility status;Other (comment)(asking about inhaler)  Activity Tolerance: Patient tolerated treatment well Patient left: in bed;with call bell/phone within reach;with family/visitor present  OT Visit Diagnosis: Unsteadiness on feet (R26.81);Other abnormalities of gait and mobility (R26.89);Pain Pain - part of body: (chest)                Time: 4010-2725 OT Time Calculation (min): 14 min Charges:  OT General Charges $OT Visit: 1 Visit OT Evaluation $OT Eval Moderate Complexity: 1 Mod  Zenovia Jarred, MSOT, OTR/L Acute Rehabilitation Services [facility]  Office Number: [phone]  Zenovia Jarred 03/19/2019, 3:48 PM

## 2019-03-20 LAB — COMPREHENSIVE METABOLIC PANEL
ALT: 34 U/L (ref 0–44)
AST: 18 U/L (ref 15–41)
Albumin: 3.6 g/dL (ref 3.5–5.0)
Alkaline Phosphatase: 49 U/L (ref 38–126)
Anion gap: 13 (ref 5–15)
BUN: 21 mg/dL — ABNORMAL HIGH (ref 6–20)
CO2: 26 mmol/L (ref 22–32)
Calcium: 9.4 mg/dL (ref 8.9–10.3)
Chloride: 97 mmol/L — ABNORMAL LOW (ref 98–111)
Creatinine, Ser: 0.78 mg/dL (ref 0.61–1.24)
GFR calc Af Amer: >60 mL/min (ref >60)
GFR calc non Af Amer: >60 mL/min (ref >60)
Glucose, Bld: 261 mg/dL — ABNORMAL HIGH (ref 70–99)
Potassium: 4.4 mmol/L (ref 3.5–5.1)
Sodium: 136 mmol/L (ref 135–145)
Total Bilirubin: 0.7 mg/dL (ref 0.3–1.2)
Total Protein: 7.1 g/dL (ref 6.5–8.1)

## 2019-03-20 LAB — CBC
HCT: 43.1 % (ref 39.0–52.0)
HCT: 42.4 % (ref 39.0–52.0)
Hemoglobin: 14 g/dL (ref 13.0–17.0)
Hemoglobin: 14.1 g/dL (ref 13.0–17.0)
MCH: 29.3 pg (ref 26.0–34.0)
MCH: 30 pg (ref 26.0–34.0)
MCHC: 32.5 g/dL (ref 30.0–36.0)
MCHC: 33.3 g/dL (ref 30.0–36.0)
MCV: 90.2 fL (ref 80.0–100.0)
MCV: 90.2 fL (ref 80.0–100.0)
Platelets: 348 10*3/uL (ref 150–400)
Platelets: 308 10*3/uL (ref 150–400)
RBC: 4.78 MIL/uL (ref 4.22–5.81)
RBC: 4.7 MIL/uL (ref 4.22–5.81)
RDW: 13.2 % (ref 11.5–15.5)
RDW: 13.2 % (ref 11.5–15.5)
WBC: 13.1 10*3/uL — ABNORMAL HIGH (ref 4.0–10.5)
WBC: 13.2 10*3/uL — ABNORMAL HIGH (ref 4.0–10.5)
nRBC: 0 % (ref 0.0–0.2)
nRBC: 0 % (ref 0.0–0.2)

## 2019-03-20 LAB — DIFFERENTIAL
Abs Immature Granulocytes: 0.06 10*3/uL (ref 0.00–0.07)
Basophils Absolute: 0 10*3/uL (ref 0.0–0.1)
Basophils Relative: 0 %
Eosinophils Absolute: 0 10*3/uL (ref 0.0–0.5)
Eosinophils Relative: 0 %
Immature Granulocytes: 1 %
Lymphocytes Relative: 8 %
Lymphs Abs: 1 10*3/uL (ref 0.7–4.0)
Monocytes Absolute: 0.3 10*3/uL (ref 0.1–1.0)
Monocytes Relative: 2 %
Neutro Abs: 11.8 10*3/uL — ABNORMAL HIGH (ref 1.7–7.7)
Neutrophils Relative %: 89 %

## 2019-03-20 LAB — GLUCOSE, CAPILLARY
Glucose-Capillary: 244 mg/dL — ABNORMAL HIGH (ref 70–99)
Glucose-Capillary: 283 mg/dL — ABNORMAL HIGH (ref 70–99)

## 2019-03-20 MED ORDER — NAPROXEN 500 MG PO TABS: 500.0000 mg | ORAL_TABLET | Freq: Two times a day (BID) | ORAL | 0 refills | Status: AC

## 2019-03-20 MED ORDER — FOLIC ACID 1 MG PO TABS: 1.0000 mg | ORAL_TABLET | Freq: Every day | ORAL | 0 refills | Status: DC

## 2019-03-20 MED ORDER — DOXYCYCLINE HYCLATE 100 MG PO TABS: 100.0000 mg | ORAL_TABLET | Freq: Two times a day (BID) | ORAL | 0 refills | Status: DC

## 2019-03-20 MED ORDER — METFORMIN HCL 500 MG PO TABS: 500.0000 mg | ORAL_TABLET | Freq: Two times a day (BID) | ORAL | 0 refills | Status: DC

## 2019-03-20 MED ORDER — UMECLIDINIUM-VILANTEROL 62.5-25 MCG/INH IN AEPB
1.0000 | INHALATION_SPRAY | Freq: Every day | RESPIRATORY_TRACT | Status: DC
  Filled 2019-03-20: qty 14

## 2019-03-20 MED ORDER — ATORVASTATIN CALCIUM 40 MG PO TABS: 40.0000 mg | ORAL_TABLET | Freq: Every day | ORAL | 0 refills | Status: DC

## 2019-03-20 MED ORDER — THIAMINE HCL 100 MG PO TABS
250.0000 mg | ORAL_TABLET | Freq: Every day | ORAL | Status: DC
  Administered 2019-03-20: 250 mg via ORAL
  Filled 2019-03-20: qty 3

## 2019-03-20 MED ORDER — CLOPIDOGREL BISULFATE 75 MG PO TABS: 75.0000 mg | ORAL_TABLET | Freq: Every day | ORAL | 0 refills | Status: DC

## 2019-03-20 MED ORDER — LISINOPRIL 5 MG PO TABS: 5.0000 mg | ORAL_TABLET | Freq: Every day | ORAL | 0 refills | Status: DC

## 2019-03-20 MED ORDER — THIAMINE HCL 100 MG PO TABS: 250.0000 mg | ORAL_TABLET | Freq: Every day | ORAL | 0 refills | Status: DC

## 2019-03-20 MED ORDER — UMECLIDINIUM-VILANTEROL 62.5-25 MCG/INH IN AEPB: 1.0000 | INHALATION_SPRAY | Freq: Every day | RESPIRATORY_TRACT | 0 refills | Status: DC

## 2019-03-20 MED ORDER — METOPROLOL SUCCINATE ER 25 MG PO TB24: 12.5000 mg | ORAL_TABLET | Freq: Every day | ORAL | 0 refills | Status: DC

## 2019-03-20 MED ORDER — CLOPIDOGREL BISULFATE 75 MG PO TABS
75.0000 mg | ORAL_TABLET | Freq: Every day | ORAL | Status: DC
  Administered 2019-03-20: 75 mg via ORAL
  Filled 2019-03-20: qty 1

## 2019-03-20 MED ORDER — PREDNISONE 20 MG PO TABS: 40.0000 mg | ORAL_TABLET | Freq: Every day | ORAL | 0 refills | Status: DC

## 2019-03-20 NOTE — Progress Notes (Signed)
I have started him on Plavix.  Mental status change due to C arrest and hypoxemia and doubt alcohol withdrawal. Too quick to reverse alcohol withdrawal. He is very motivated to quit smoking and also alcohol. Consider antidepressant therapy as IP as patient is very emotional and can go into major depression after an event like this. Call me if questions.  [name], MD, [facility] 03/20/2019, 8:18 AM [facility] [place] Office: [phone]  C: [phone]

## 2019-03-20 NOTE — Progress Notes (Signed)
Family Medicine Teaching Service Daily Progress Note Intern Pager: [phone]  Patient name: Robert Rich Medical record number: [id number] Date of birth: 12/25/1965 Age: 54 y.o. Gender: male  Primary Care Provider: Bridget Hartshorn, NP Consultants: cardiology Code Status: Full  Pt Overview and Major Events to Date:  2/18: PEA arrest with ROSC, admitted 2/19: heart cath, echo  Assessment and Plan: Robert Rich is a 54 y.o. male presenting with respiratory distress and brief PEA arrest. PMH is significant for COPD, coronary disease, history CVA, h/o tobacco use, COVID-19 positive in 11/2018.  Brief PEA arrest Heart chatheterization yesterday very reassuring: no significant CAD, LV and LVEF grossly normal, EDP markedly elevated, LAD ostial with 50% stenosis. Cardiology recommends primary prevention with aggressive control of DM, HTN, and HLD, recommend smoking cessation, consider plavix as patient is allergic to ASA and has some stenosis in ostial LAD. Echo with similar results: LVEF 55-60% with no regional wall motion abnormalities. R ventricular systolic function normal. CK decreased from 668 to 180, and LA decreased from 4.9 to 1.7. Given this information and the fact that patient desatted to 55s while under anesthesia in the cath lab, strongly suspect cardiac arrest was due to underlying COPD and respiratory distress, probably worsened by alcohol and benzodiazepine intoxication.  Further cardiac recommendations include: beta blocker, ACEI/ARB, and statin due to ASCVD>10%, see below. -Consult cardiology, aggressive primary prevention with control of T2DM, HTN, and HLD. Left heart cath show only 50% stenosis of LAD otherwise no significant coronary disease. Consider starting Plavix. -Continue lisinopril, atorvastatin, and metoprolol  - Starting Plavix -Heart healthy/carb modified diet  -Lovenox for DVT prophylaxis -PT/OT eval and treat; no follow up needed  Acute hypoxic respiratory  failure  COPD Overnight patient's respiratory status improved, patient is currently satting 90-92% on RA. COPD exacerbation, smoking, and EtOH/benzodiazipine intake most likely cause of cardiac arrest. Patient is currently stable on room air, however pulmonary exam is significant for wheezing in bilateral lung fields. CXR from admission with no acute findings. CTA of lungs on admission with "minimal dependent subpleural atelectasis/edema." Patient is AOx4 today and states that he quit smoking 3 weeks ago and this is further impetus for him to stay off cigarettes. Will discuss smoking and alcohol cessation in further detail. Patient will likely need to be followed by pulmonology as an outpatient for PFTs. - Patient is medically stable and ready for discharge today; will ambulate with pulse ox before discharge - Incentive spirometry - O2 therapy: maintain SpO2 to 88-92% via [place] - Albuterol neb q2h PRN wheezing - Will start patient on LABA+LAMA on discharge as he has meets GOLD criteria C (at least) with a hospitalization. Eosinophils not elevated so will benefit more from LABA+LAMA - Solumedrol 60 mg IV BID today, switch to oral prednisone tomorrow - Doxycycline 100 mg BID x 5d; started on 2/20 and received one dose today. Will go home with 3.5 days.  Substance use disorder  possible alcohol withdrawal CIWAs overnight 0>3>3>0>0. Cognition this am completely normal.  -Thiamine, folic acid, multivitamin -Consult social work for possible community resources for substance use disorder  DMT2 CBG from 226 to 159 overnight, received 8U aspart. Patient did receive one dose of solumedrol in ED on 2/18. A1c on admission 7.3%. Plan to start metformin 500 mg prior to DC. -Daily BMP   - SSI  Transaminitis-resolved AST/ALT improved to 18, 34 respectively today.  AST/ALT 95, 46 on admission. Hepatitis C ab negative. Patient had elevated AST/ALT in September 2015 to  51 and 67 respectively, at which time  hepatitis work-up was negative for hepatitis A, B, or C. 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 Patient previously on verapamil, but has not taken in many months. During this admission Lisinopril 5 mg daily. Plan to add beta blocker today per cardiology recommendations. -Continue to monitor - start lisinopril 5 mg daily - start metoprolol 12.5 mg daily  HLD Lipid panel: Total cholesterol 185, HDL 61, LDL 108, triglycerides 80. ASCVD 10 year risk 20%, recommend at least moderate-intensity statin. Patient with elevated lipids since 2015 and on a statin since CVA in 2016.  Most recently taking Crestor 5 mg charted in August 2020.  However according to pharmacist tech prescription review, patient has not picked up any prescription other than Ativan.  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  Patient previously was not on secondary prevention prior to this.  Afterwards he was on Plavix and Lipitor 80.  The time of the stroke, echocardiogram with Doppler and bubble study was obtained in October 2016 that showed no cardiac etiology for the stroke. - Recommend PCP consider plavix d/t ASA allergy  FEN/GI: Heart healthy/carb modified PPx: Lovenox  Disposition: to progressive pending medical stabilization and work up  Subjective:  Patient is feeling well, laying down comfortably resting in his room. He still has chest pain but it is tender to palpation and is not deep or pressure like. We discussed most likely it is from the chest compressions he received.  Objective: Temp:  [97.6 F (36.4 C)-99.2 F (37.3 C)] 97.9 F (36.6 C) (02/21 0500) Pulse Rate:  [68-90] 71  (02/21 0500) Resp:  [14-20] 14 (02/21 0500) BP: (137-153)/(72-93) 153/93 (02/21 0500) SpO2:  [91 %-99 %] 95 % (02/21 0737) Physical Exam: Gen: Alert and Oriented x 3, NAD CV: RRR, no murmurs, normal S1, S2 split Resp: mild diffuse wheezing, no rales, or rhonchi, comfortable work of breathing Ext: no clubbing, cyanosis, or edema Skin: warm, dry, intact, no rashes  Laboratory: Recent Labs  Lab 03/17/19 1400 03/17/19 1400 03/17/19 1457 03/18/19 0006 03/20/19 0209  WBC 13.7*  --   --  14.4* 13.1*  HGB 14.6   < > 15.6 13.4 14.0  HCT 45.5   < > 46.0 40.7 43.1  PLT 380  --   --  331 348   < > = values in this interval not displayed.   Recent Labs  Lab 03/18/19 0006 03/19/19 0049 03/19/19 0950 03/20/19 0209  NA 135 136  --  136  K 4.6 5.0  --  4.4  CL 104 97*  --  97*  CO2 21* 27  --  26  BUN 15 20  --  21*  CREATININE 1.00 0.97  --  0.78  CALCIUM 8.5* 8.8*  --  9.4  PROT 7.0  --  6.7 7.1  BILITOT 0.7  --  0.2* 0.7  ALKPHOS 51  --  47 49  ALT 48*  --  37 34  AST 53*  --  26 18  GLUCOSE 282* 164*  --  261*   Imaging/Diagnostic Tests:  CXR: IMPRESSION:  No evidence of acute cardiopulmonary abnormality. CT  Head: IMPRESSION: 1. Remote right cerebellar infarct. 2. No acute intracranial findings or mass lesions. 3. Paranasal sinus disease. CT Chest: IMPRESSION: 1. No CT findings for pulmonary embolism. 2. Age advanced atherosclerotic calcifications involving the thoracic aorta but no aneurysm or dissection. 3. No acute pulmonary findings.  Arlyce Harman, DO 03/20/2019, 7:48 AM PGY-3, [facility] FPTS Intern pager: [phone], text pages welcome

## 2019-03-20 NOTE — Progress Notes (Signed)
SpO2 at rest : 92-93%. SpO2 93% while ambulating, Patient denied SOB but end of ambulation SpO2 drop 83%, but went back up within 3 sec SpO2 92-93%. Patient doesn't need O2 to ambulation. HS [employer]

## 2019-03-20 NOTE — Progress Notes (Signed)
Removed PIV access and received discharge instructions. Pt understood it well. He took his all belongings. Family medicine wanted patient to have inhaler at the hospital then go home. Gave inhaler to him. Case manger called in patient room after patient was discharged to home, because patient doesn't have insurance. Patient didn't have concerning regarding pay his medications. She will call patient to ask this matter. HS [employer]

## 2019-03-20 NOTE — Discharge Instructions (Signed)
Robert Rich, Mo were hospitalized due to having a pulseless electrical activity also known as cardiac arrest. It is currently thought this was due to your uncontrolled COPD. I have started you on a controller medication that will help prevent your COPD from getting worse. It is CRITICAL that you obtain and use this medication as instructed. Otherwise, I do believe you run the risk of a repeat event. We also started you on several other medications for better control of your diabetes and blood pressure. Please take these as well. You should NOT drive for 6 months following this event. We also recommend you follow up with your PCP and you may benefit from being seen and evaluated by a Pulmonologist, a doctor that specializes in lung disease.  Chronic Obstructive Pulmonary Disease  Chronic obstructive pulmonary disease (COPD) is a long-term (chronic) condition that affects the lungs. COPD is a general term that can be used to describe many different lung problems that cause lung swelling (inflammation) and limit airflow, including chronic bronchitis and emphysema. If you have COPD, your lung function will probably never return to normal. In most cases, it gets worse over time. However, there are steps you can take to slow the progression of the disease and improve your quality of life. What are the causes? This condition may be caused by:  Smoking. This is the most common cause.  Certain genes passed down through families. What increases the risk? The following factors may make you more likely to develop this condition:  Secondhand smoke from cigarettes, pipes, or cigars.  Exposure to chemicals and other irritants such as fumes and dust in the work environment.  Chronic lung conditions or infections. What are the signs or symptoms? Symptoms of this condition include:  Shortness of breath, especially during physical activity.  Chronic cough with a large amount of thick mucus. Sometimes the  cough may not have any mucus (dry cough).  Wheezing.  Rapid breaths.  Gray or bluish discoloration (cyanosis) of the skin, especially in your fingers, toes, or lips.  Feeling tired (fatigue).  Weight loss.  Chest tightness.  Frequent infections.  Episodes when breathing symptoms become much worse (exacerbations).  Swelling in the ankles, feet, or legs. This may occur in later stages of the disease. How is this diagnosed? This condition is diagnosed based on:  Your medical history.  A physical exam. You may also have tests, including:  Lung (pulmonary) function tests. This may include a spirometry test, which measures your ability to exhale properly.  Chest X-ray.  CT scan.  Blood tests. How is this treated? This condition may be treated with:  Medicines. These may include inhaled rescue medicines to treat acute exacerbations as well as long-term, or maintenance, medicines to prevent flare-ups of COPD. ? Bronchodilators help treat COPD by dilating the airways to allow increased airflow and make your breathing more comfortable. ? Steroids can reduce airway inflammation and help prevent exacerbations.  Smoking cessation. If you smoke, your health care provider may ask you to quit, and may also recommend therapy or replacement products to help you quit.  Pulmonary rehabilitation. This may involve working with a team of health care providers and specialists, such as respiratory, occupational, and physical therapists.  Exercise and physical activity. These are beneficial for nearly all people with COPD.  Nutrition therapy to gain weight, if you are underweight.  Oxygen. Supplemental oxygen therapy is only helpful if you have a low oxygen level in your blood (hypoxemia).  Lung surgery  or transplant.  Palliative care. This is to help people with COPD feel comfortable when treatment is no longer working. Follow these instructions at home: Medicines  Take  over-the-counter and prescription medicines (inhaled or pills) only as told by your health care provider.  Talk to your health care provider before taking any cough or allergy medicines. You may need to avoid certain medicines that dry out your airways. Lifestyle  If you are a smoker, the most important thing that you can do is to stop smoking. Do not use any products that contain nicotine or tobacco, such as cigarettes and e-cigarettes. If you need help quitting, ask your health care provider. Continuing to smoke will cause the disease to progress faster.  Avoid exposure to things that irritate your lungs, such as smoke, chemicals, and fumes.  Stay active, but balance activity with periods of rest. Exercise and physical activity will help you maintain your ability to do things you want to do.  Learn and use relaxation techniques to manage stress and to control your breathing.  Get the right amount of sleep and get quality sleep. Most adults need 7 or more hours per night.  Eat healthy foods. Eating smaller, more frequent meals and resting before meals may help you maintain your strength. Controlled breathing Learn and use controlled breathing techniques as directed by your health care provider. Controlled breathing techniques include:  Pursed lip breathing. Start by breathing in (inhaling) through your nose for 1 second. Then, purse your lips as if you were going to whistle and breathe out (exhale) through the pursed lips for 2 seconds.  Diaphragmatic breathing. Start by putting one hand on your abdomen just above your waist. Inhale slowly through your nose. The hand on your abdomen should move out. Then purse your lips and exhale slowly. You should be able to feel the hand on your abdomen moving in as you exhale. Controlled coughing Learn and use controlled coughing to clear mucus from your lungs. Controlled coughing is a series of short, progressive coughs. The steps of controlled coughing  are: 1. Lean your head slightly forward. 2. Breathe in deeply using diaphragmatic breathing. 3. Try to hold your breath for 3 seconds. 4. Keep your mouth slightly open while coughing twice. 5. Spit any mucus out into a tissue. 6. Rest and repeat the steps once or twice as needed. General instructions  Make sure you receive all the vaccines that your health care provider recommends, especially the pneumococcal and influenza vaccines. Preventing infection and hospitalization is very important when you have COPD.  Use oxygen therapy and pulmonary rehabilitation if directed to by your health care provider. If you require home oxygen therapy, ask your health care provider whether you should purchase a pulse oximeter to measure your oxygen level at home.  Work with your health care provider to develop a COPD action plan. This will help you know what steps to take if your condition gets worse.  Keep other chronic health conditions under control as told by your health care provider.  Avoid extreme temperature and humidity changes.  Avoid contact with people who have an illness that spreads from person to person (is contagious), such as viral infections or pneumonia.  Keep all follow-up visits as told by your health care provider. This is important. Contact a health care provider if:  You are coughing up more mucus than usual.  There is a change in the color or thickness of your mucus.  Your breathing is more labored than  usual.  Your breathing is faster than usual.  You have difficulty sleeping.  You need to use your rescue medicines or inhalers more often than expected.  You have trouble doing routine activities such as getting dressed or walking around the house. Get help right away if:  You have shortness of breath while you are resting.  You have shortness of breath that prevents you from: ? Being able to talk. ? Performing your usual physical activities.  You have chest pain  lasting longer than 5 minutes.  Your skin color is more blue (cyanotic) than usual.  You measure low oxygen saturations for longer than 5 minutes with a pulse oximeter.  You have a fever.  You feel too tired to breathe normally. Summary  Chronic obstructive pulmonary disease (COPD) is a long-term (chronic) condition that affects the lungs.  Your lung function will probably never return to normal. In most cases, it gets worse over time. However, there are steps you can take to slow the progression of the disease and improve your quality of life.  Treatment for COPD may include taking medicines, quitting smoking, pulmonary rehabilitation, and changes to diet and exercise. As the disease progresses, you may need oxygen therapy, a lung transplant, or palliative care.  To help manage your condition, do not smoke, avoid exposure to things that irritate your lungs, stay up to date on all vaccines, and follow your health care provider's instructions for taking medicines. This information is not intended to replace advice given to you by your health care provider. Make sure you discuss any questions you have with your health care provider. Document Revised: 12/26/2016 Document Reviewed: 02/18/2016 Elsevier Patient Education  2020 [employer].

## 2019-03-28 ENCOUNTER — Emergency Department (HOSPITAL_COMMUNITY): Payer: Self-pay

## 2019-03-28 ENCOUNTER — Emergency Department (HOSPITAL_COMMUNITY)
Admission: EM | Admit: 2019-03-28 | Discharge: 2019-03-28 | Disposition: A | Discharge status: Home / Self Care | Payer: Self-pay | Attending: Emergency Medicine | Admitting: Emergency Medicine

## 2019-03-28 ENCOUNTER — Encounter (HOSPITAL_COMMUNITY): Payer: Self-pay | Admitting: Emergency Medicine

## 2019-03-28 DIAGNOSIS — E119 Type 2 diabetes mellitus without complications: Secondary | ICD-10-CM | POA: Insufficient documentation

## 2019-03-28 DIAGNOSIS — R0602 Shortness of breath: Secondary | ICD-10-CM

## 2019-03-28 DIAGNOSIS — J441 Chronic obstructive pulmonary disease with (acute) exacerbation: Secondary | ICD-10-CM | POA: Insufficient documentation

## 2019-03-28 DIAGNOSIS — Z7984 Long term (current) use of oral hypoglycemic drugs: Secondary | ICD-10-CM | POA: Insufficient documentation

## 2019-03-28 DIAGNOSIS — R0789 Other chest pain: Secondary | ICD-10-CM | POA: Insufficient documentation

## 2019-03-28 DIAGNOSIS — Z79899 Other long term (current) drug therapy: Secondary | ICD-10-CM | POA: Insufficient documentation

## 2019-03-28 DIAGNOSIS — I10 Essential (primary) hypertension: Secondary | ICD-10-CM | POA: Insufficient documentation

## 2019-03-28 DIAGNOSIS — Z20822 Contact with and (suspected) exposure to covid-19: Secondary | ICD-10-CM | POA: Insufficient documentation

## 2019-03-28 DIAGNOSIS — Z8616 Personal history of COVID-19: Secondary | ICD-10-CM | POA: Insufficient documentation

## 2019-03-28 DIAGNOSIS — F17210 Nicotine dependence, cigarettes, uncomplicated: Secondary | ICD-10-CM | POA: Insufficient documentation

## 2019-03-28 DIAGNOSIS — I2510 Atherosclerotic heart disease of native coronary artery without angina pectoris: Secondary | ICD-10-CM | POA: Insufficient documentation

## 2019-03-28 LAB — CBC
HCT: 42.1 % (ref 39.0–52.0)
Hemoglobin: 13.6 g/dL (ref 13.0–17.0)
MCH: 29.6 pg (ref 26.0–34.0)
MCHC: 32.3 g/dL (ref 30.0–36.0)
MCV: 91.5 fL (ref 80.0–100.0)
Platelets: 439 10*3/uL — ABNORMAL HIGH (ref 150–400)
RBC: 4.6 MIL/uL (ref 4.22–5.81)
RDW: 13.2 % (ref 11.5–15.5)
WBC: 13.4 10*3/uL — ABNORMAL HIGH (ref 4.0–10.5)
nRBC: 0 % (ref 0.0–0.2)

## 2019-03-28 LAB — BASIC METABOLIC PANEL
Anion gap: 7 (ref 5–15)
BUN: 20 mg/dL (ref 6–20)
CO2: 25 mmol/L (ref 22–32)
Calcium: 8.4 mg/dL — ABNORMAL LOW (ref 8.9–10.3)
Chloride: 102 mmol/L (ref 98–111)
Creatinine, Ser: 0.57 mg/dL — ABNORMAL LOW (ref 0.61–1.24)
GFR calc Af Amer: >60 mL/min (ref >60)
GFR calc non Af Amer: >60 mL/min (ref >60)
Glucose, Bld: 173 mg/dL — ABNORMAL HIGH (ref 70–99)
Potassium: 4.3 mmol/L (ref 3.5–5.1)
Sodium: 134 mmol/L — ABNORMAL LOW (ref 135–145)

## 2019-03-28 LAB — BRAIN NATRIURETIC PEPTIDE: B Natriuretic Peptide: 70 pg/mL (ref 0.0–100.0)

## 2019-03-28 LAB — TROPONIN I (HIGH SENSITIVITY)
Troponin I (High Sensitivity): 3 ng/L (ref <18)
Troponin I (High Sensitivity): <2 ng/L (ref <18)

## 2019-03-28 LAB — RESPIRATORY PANEL BY RT PCR (FLU A&B, COVID)
Influenza A by PCR: NEGATIVE
Influenza B by PCR: NEGATIVE
SARS Coronavirus 2 by RT PCR: NEGATIVE

## 2019-03-28 MED ORDER — IOHEXOL 300 MG/ML  SOLN
75.0000 mL | Freq: Once | INTRAMUSCULAR | Status: AC | PRN
  Administered 2019-03-28: 75 mL via INTRAVENOUS

## 2019-03-28 MED ORDER — METHYLPREDNISOLONE SODIUM SUCC 125 MG IJ SOLR
125.0000 mg | Freq: Once | INTRAMUSCULAR | Status: AC
  Administered 2019-03-28: 125 mg via INTRAVENOUS
  Filled 2019-03-28: qty 2

## 2019-03-28 MED ORDER — MORPHINE SULFATE (PF) 4 MG/ML IV SOLN
4.0000 mg | Freq: Once | INTRAVENOUS | Status: AC
  Administered 2019-03-28: 4 mg via INTRAVENOUS
  Filled 2019-03-28: qty 1

## 2019-03-28 MED ORDER — HYDROCODONE-ACETAMINOPHEN 5-325 MG PO TABS: 1.0000 | ORAL_TABLET | Freq: Four times a day (QID) | ORAL | 0 refills | Status: DC | PRN

## 2019-03-28 MED ORDER — ALBUTEROL SULFATE HFA 108 (90 BASE) MCG/ACT IN AERS
8.0000 | INHALATION_SPRAY | Freq: Once | RESPIRATORY_TRACT | Status: AC
  Administered 2019-03-28: 8 via RESPIRATORY_TRACT
  Filled 2019-03-28: qty 6.7

## 2019-03-28 NOTE — ED Triage Notes (Signed)
Pt began having chest pain and trouble breathing this morning. Used his rescue inhaler this morning but was instructed not to use nebulizer. Was in cardiac arrest a few weeks ago presumed due to COPD. Was unable to afford his meds upon d/c.

## 2019-03-28 NOTE — ED Provider Notes (Signed)
[facility] Provider Note   CSN: [id number] Arrival date & time: 03/28/19  1013     History Chief Complaint  Patient presents with  . Shortness of Breath    Robert Rich is a 54 y.o. male.  Patient complains of chest pain and shortness of breath.  He recently was admitted with a cardiac arrest.  Supposedly CPR was done prior to coming to the emergency department  The history is provided by the patient. No language interpreter was used.  Shortness of Breath Severity:  Moderate Onset quality:  Sudden Timing:  Constant Progression:  Waxing and waning Chronicity:  Recurrent Context: activity   Relieved by:  Nothing Associated symptoms: no abdominal pain, no chest pain, no cough, no headaches and no rash        Past Medical History:  Diagnosis Date  . COPD (chronic obstructive pulmonary disease) ([place])   . Coronary artery disease   . DM (diabetes mellitus) ([place])   . Heart attack ([place])   . HLD (hyperlipidemia)   . HTN (hypertension)   . Obesity   . Stroke [facility])     Patient Active Problem List   Diagnosis Date Noted  . Cardiac arrest ([place]) 03/17/2019  . 2019 novel coronavirus disease (COVID-19) 12/07/2018  . COPD exacerbation ([place]) 12/06/2018  . Tobacco abuse 12/06/2018    Past Surgical History:  Procedure Laterality Date  . INTRAVASCULAR PRESSURE WIRE/FFR STUDY N/A 03/18/2019   Procedure: INTRAVASCULAR PRESSURE WIRE/FFR STUDY;  Surgeon: Adrian Prows, MD;  Location: [facility];  Service: Cardiovascular;  Laterality: N/A;  . LEFT HEART CATH AND CORONARY ANGIOGRAPHY N/A 03/18/2019   Procedure: LEFT HEART CATH AND CORONARY ANGIOGRAPHY;  Surgeon: Adrian Prows, MD;  Location: [facility];  Service: Cardiovascular;  Laterality: N/A;  . NO PAST SURGERIES         Family History  Problem Relation Age of Onset  . Kidney disease Father     Social History   Tobacco Use  . Smoking status: Current Some Day Smoker    Packs/day: 0.10  .  Smokeless tobacco: Never Used  Substance Use Topics  . Alcohol use: Yes    Alcohol/week: 12.0 standard drinks    Types: 12 Cans of beer per week    Comment: Weekly  . Drug use: Not Currently    Home Medications Prior to Admission medications   Medication Sig Start Date End Date Taking? Authorizing Provider  albuterol (VENTOLIN HFA) 108 (90 Base) MCG/ACT inhaler Inhale 2 puffs into the lungs every 6 (six) hours. Patient taking differently: Inhale 2 puffs into the lungs every 6 (six) hours as needed for wheezing or shortness of breath.  12/07/18   Orson Eva, MD  ALPRAZolam Duanne Moron) 0.5 MG tablet Take 0.5 mg by mouth 2 (two) times daily as needed for anxiety.  01/14/18   [provider]  atorvastatin (LIPITOR) 40 MG tablet Take 1 tablet (40 mg total) by mouth daily at 6 PM. 03/20/19   Nuala Alpha, DO  clopidogrel (PLAVIX) 75 MG tablet Take 1 tablet (75 mg total) by mouth daily. 03/21/19   Nuala Alpha, DO  doxycycline (VIBRA-TABS) 100 MG tablet Take 1 tablet (100 mg total) by mouth every 12 (twelve) hours. 03/20/19   Nuala Alpha, DO  folic acid (FOLVITE) 1 MG tablet Take 1 tablet (1 mg total) by mouth daily. 03/21/19   Nuala Alpha, DO  HYDROcodone-acetaminophen (NORCO/VICODIN) 5-325 MG tablet Take 1 tablet by mouth every 6 (six) hours  as needed. 03/28/19   Bethann Berkshire, MD  Ipratropium-Albuterol (COMBIVENT) 20-100 MCG/ACT AERS respimat Inhale 1 puff into the lungs every 6 (six) hours. Patient not taking: Reported on 03/17/2019 12/07/18   TatOnalee Hua, MD  ipratropium-albuterol (DUONEB) 0.5-2.5 (3) MG/3ML SOLN Inhale 3 mLs into the lungs 4 (four) times daily as needed (for shortness of breath or wheezing).  04/20/18   [provider]  lisinopril (ZESTRIL) 5 MG tablet Take 1 tablet (5 mg total) by mouth daily. 03/21/19   Arlyce Harman, DO  metFORMIN (GLUCOPHAGE) 500 MG tablet Take 1 tablet (500 mg total) by mouth 2 (two) times daily with a meal. 03/20/19 04/19/19   Arlyce Harman, DO  metoprolol succinate (TOPROL-XL) 25 MG 24 hr tablet Take 0.5 tablets (12.5 mg total) by mouth daily. 03/21/19   Arlyce Harman, DO  naproxen (NAPROSYN) 500 MG tablet Take 1 tablet (500 mg total) by mouth 2 (two) times daily with a meal. 03/20/19 04/19/19  Arlyce Harman, DO  predniSONE (DELTASONE) 20 MG tablet Take 2 tablets (40 mg total) by mouth daily with breakfast. 03/21/19   Arlyce Harman, DO  sildenafil (REVATIO) 20 MG tablet Take 20-100 mg by mouth daily as needed (FOR E.D.).     [provider]  thiamine 100 MG tablet Take 2.5 tablets (250 mg total) by mouth daily. 03/20/19   Arlyce Harman, DO  umeclidinium-vilanterol (ANORO ELLIPTA) 62.5-25 MCG/INH AEPB Inhale 1 puff into the lungs daily. 03/20/19   Arlyce Harman, DO    Allergies    Zolmitriptan and Asa [aspirin]  Review of Systems   Review of Systems  Constitutional: Negative for appetite change and fatigue.  HENT: Negative for congestion, ear discharge and sinus pressure.   Eyes: Negative for discharge.  Respiratory: Positive for shortness of breath. Negative for cough.   Cardiovascular: Negative for chest pain.  Gastrointestinal: Negative for abdominal pain and diarrhea.  Genitourinary: Negative for frequency and hematuria.  Musculoskeletal: Negative for back pain.  Skin: Negative for rash.  Neurological: Negative for seizures and headaches.  Psychiatric/Behavioral: Negative for hallucinations.    Physical Exam Updated Vital Signs BP 124/77   Pulse 76   Temp 97.8 F (36.6 C) (Oral)   Resp (!) 21   Ht 5\' 9"  (1.753 m)   Wt 90.7 kg   SpO2 95%   BMI 29.53 kg/m   Physical Exam Vitals and nursing note reviewed.  Constitutional:      Appearance: He is well-developed.  HENT:     Head: Normocephalic.     Nose: Nose normal.  Eyes:     General: No scleral icterus.    Conjunctiva/sclera: Conjunctivae normal.  Neck:     Thyroid: No thyromegaly.  Cardiovascular:     Rate and  Rhythm: Normal rate and regular rhythm.     Heart sounds: No murmur. No friction rub. No gallop.      Comments: Tenderness to chest wall Pulmonary:     Breath sounds: No stridor. Wheezing present. No rales.  Chest:     Chest wall: Tenderness present.  Abdominal:     General: There is no distension.     Tenderness: There is no abdominal tenderness. There is no rebound.  Musculoskeletal:        General: Normal range of motion.     Cervical back: Neck supple.  Lymphadenopathy:     Cervical: No cervical adenopathy.  Skin:    Findings: No erythema or rash.  Neurological:     Mental Status: He is  alert and oriented to person, place, and time.     Motor: No abnormal muscle tone.     Coordination: Coordination normal.  Psychiatric:        Behavior: Behavior normal.     ED Results / Procedures / Treatments   Labs (all labs ordered are listed, but only abnormal results are displayed) Labs Reviewed  BASIC METABOLIC PANEL - Abnormal; Notable for the following components:      Result Value   Sodium 134 (*)    Glucose, Bld 173 (*)    Creatinine, Ser 0.57 (*)    Calcium 8.4 (*)    All other components within normal limits  CBC - Abnormal; Notable for the following components:   WBC 13.4 (*)    Platelets 439 (*)    All other components within normal limits  RESPIRATORY PANEL BY RT PCR (FLU A&B, COVID)  BRAIN NATRIURETIC PEPTIDE  TROPONIN I (HIGH SENSITIVITY)  TROPONIN I (HIGH SENSITIVITY)    EKG EKG Interpretation  Date/Time:  Monday March 28 2019 10:25:51 EST Ventricular Rate:  80 PR Interval:    QRS Duration: 86 QT Interval:  374 QTC Calculation: 432 R Axis:   -3 Text Interpretation: Sinus rhythm Confirmed by Bethann Berkshire [phone]) on 03/28/2019 10:49:24 AM   Radiology CT Chest W Contrast  Result Date: 03/28/2019 CLINICAL DATA:  54 year old male with history of chest pain and difficulty breathing this morning. EXAM: CT CHEST WITH CONTRAST TECHNIQUE: Multidetector CT  imaging of the chest was performed during intravenous contrast administration. CONTRAST:  81mL OMNIPAQUE IOHEXOL 300 MG/ML  SOLN COMPARISON:  Chest CT 03/17/2019. FINDINGS: Cardiovascular: Heart size is normal. There is no significant pericardial fluid, thickening or pericardial calcification. There is aortic atherosclerosis, as well as atherosclerosis of the great vessels of the mediastinum and the coronary arteries, including calcified atherosclerotic plaque in the left anterior descending coronary artery. Mediastinum/Nodes: No pathologically enlarged mediastinal or hilar lymph nodes. Esophagus is unremarkable in appearance. No axillary lymphadenopathy. Lungs/Pleura: Multiple tiny 2-4 mm pulmonary nodules scattered throughout the lungs bilaterally. No larger more suspicious appearing pulmonary nodules or masses. No acute consolidative airspace disease. No pleural effusions. Upper Abdomen: Aortic atherosclerosis. Musculoskeletal: There are no aggressive appearing lytic or blastic lesions noted in the visualized portions of the skeleton. IMPRESSION: 1. No acute findings noted in the thorax to account for the patient's symptoms. 2. Multiple tiny 2-4 mm pulmonary nodules, nonspecific, but favored to be benign. No follow-up needed if patient is low-risk (and has no known or suspected primary neoplasm). Non-contrast chest CT can be considered in 12 months if patient is high-risk. This recommendation follows the consensus statement: Guidelines for Management of Incidental Pulmonary Nodules Detected on CT Images: From the Fleischner Society 2017; Radiology 2017; 284:228-243. 3. Aortic atherosclerosis, in addition to left anterior descending coronary artery disease. Please note that although the presence of coronary artery calcium documents the presence of coronary artery disease, the severity of this disease and any potential stenosis cannot be assessed on this non-gated CT examination. Assessment for potential risk  factor modification, dietary therapy or pharmacologic therapy may be warranted, if clinically indicated. Aortic Atherosclerosis (ICD10-I70.0). Electronically Signed   By: Trudie Reed M.D.   On: 03/28/2019 14:08   DG Chest Port 1 View  Result Date: 03/28/2019 CLINICAL DATA:  Chest pain, shortness of breath EXAM: PORTABLE CHEST 1 VIEW COMPARISON:  03/17/2019 FINDINGS: Heart and mediastinal contours are within normal limits. No focal opacities or effusions. No acute bony abnormality. IMPRESSION: No  active disease. Electronically Signed   By: Charlett Nose M.D.   On: 03/28/2019 11:00    Procedures Procedures (including critical care time)  Medications Ordered in ED Medications  albuterol (VENTOLIN HFA) 108 (90 Base) MCG/ACT inhaler 8 puff (8 puffs Inhalation Given 03/28/19 1040)  methylPREDNISolone sodium succinate (SOLU-MEDROL) 125 mg/2 mL injection 125 mg (125 mg Intravenous Given 03/28/19 1113)  morphine 4 MG/ML injection 4 mg (4 mg Intravenous Given 03/28/19 1105)  iohexol (OMNIPAQUE) 300 MG/ML solution 75 mL (75 mLs Intravenous Contrast Given 03/28/19 1347)    ED Course  I have reviewed the triage vital signs and the nursing notes.  Pertinent labs & imaging results that were available during my care of the patient were reviewed by me and considered in my medical decision making (see chart for details).    MDM Rules/Calculators/A&P                      Labs unremarkable.  CT scan negative.  Patient improved with albuterol.  Patient has exacerbation of COPD and chest wall inflammation.  He is sent home with some Vicodin and will use his albuterol inhaler follow-up with his PCP Final Clinical Impression(s) / ED Diagnoses Final diagnoses:  COPD exacerbation (HCC)  Chest wall pain    Rx / DC Orders ED Discharge Orders         Ordered    HYDROcodone-acetaminophen (NORCO/VICODIN) 5-325 MG tablet  Every 6 hours PRN     03/28/19 1449           Bethann Berkshire, MD 03/28/19 1455

## 2019-03-28 NOTE — Discharge Instructions (Addendum)
Follow-up with your doctor as planned next week.  Use your inhalers as needed.

## 2019-03-28 NOTE — ED Notes (Signed)
Pt transported to CT ?

## 2019-04-08 ENCOUNTER — Other Ambulatory Visit: Payer: Self-pay

## 2019-04-08 ENCOUNTER — Emergency Department (HOSPITAL_COMMUNITY): Payer: Self-pay

## 2019-04-08 ENCOUNTER — Emergency Department (HOSPITAL_COMMUNITY)
Admission: EM | Admit: 2019-04-08 | Discharge: 2019-04-08 | Disposition: A | Discharge status: Home / Self Care | Payer: Self-pay | Attending: Emergency Medicine | Admitting: Emergency Medicine

## 2019-04-08 DIAGNOSIS — I2510 Atherosclerotic heart disease of native coronary artery without angina pectoris: Secondary | ICD-10-CM | POA: Insufficient documentation

## 2019-04-08 DIAGNOSIS — I10 Essential (primary) hypertension: Secondary | ICD-10-CM | POA: Insufficient documentation

## 2019-04-08 DIAGNOSIS — E119 Type 2 diabetes mellitus without complications: Secondary | ICD-10-CM | POA: Insufficient documentation

## 2019-04-08 DIAGNOSIS — F17210 Nicotine dependence, cigarettes, uncomplicated: Secondary | ICD-10-CM | POA: Insufficient documentation

## 2019-04-08 DIAGNOSIS — R0789 Other chest pain: Secondary | ICD-10-CM | POA: Insufficient documentation

## 2019-04-08 DIAGNOSIS — J449 Chronic obstructive pulmonary disease, unspecified: Secondary | ICD-10-CM | POA: Insufficient documentation

## 2019-04-08 DIAGNOSIS — Z79899 Other long term (current) drug therapy: Secondary | ICD-10-CM | POA: Insufficient documentation

## 2019-04-08 DIAGNOSIS — Z7984 Long term (current) use of oral hypoglycemic drugs: Secondary | ICD-10-CM | POA: Insufficient documentation

## 2019-04-08 LAB — CBC
HCT: 39.7 % (ref 39.0–52.0)
Hemoglobin: 12.7 g/dL — ABNORMAL LOW (ref 13.0–17.0)
MCH: 29.1 pg (ref 26.0–34.0)
MCHC: 32 g/dL (ref 30.0–36.0)
MCV: 90.8 fL (ref 80.0–100.0)
Platelets: 404 10*3/uL — ABNORMAL HIGH (ref 150–400)
RBC: 4.37 MIL/uL (ref 4.22–5.81)
RDW: 13 % (ref 11.5–15.5)
WBC: 9.3 10*3/uL (ref 4.0–10.5)
nRBC: 0 % (ref 0.0–0.2)

## 2019-04-08 LAB — BASIC METABOLIC PANEL
Anion gap: 11 (ref 5–15)
BUN: 17 mg/dL (ref 6–20)
CO2: 24 mmol/L (ref 22–32)
Calcium: 8.8 mg/dL — ABNORMAL LOW (ref 8.9–10.3)
Chloride: 103 mmol/L (ref 98–111)
Creatinine, Ser: 0.73 mg/dL (ref 0.61–1.24)
GFR calc Af Amer: >60 mL/min (ref >60)
GFR calc non Af Amer: >60 mL/min (ref >60)
Glucose, Bld: 207 mg/dL — ABNORMAL HIGH (ref 70–99)
Potassium: 4 mmol/L (ref 3.5–5.1)
Sodium: 138 mmol/L (ref 135–145)

## 2019-04-08 LAB — TROPONIN I (HIGH SENSITIVITY)
Troponin I (High Sensitivity): 4 ng/L (ref <18)
Troponin I (High Sensitivity): 5 ng/L (ref <18)

## 2019-04-08 MED ORDER — KETOROLAC TROMETHAMINE 30 MG/ML IJ SOLN
15.0000 mg | Freq: Once | INTRAMUSCULAR | Status: AC
  Administered 2019-04-08: 15 mg via INTRAVENOUS
  Filled 2019-04-08: qty 1

## 2019-04-08 MED ORDER — TRAMADOL HCL 50 MG PO TABS: 50.0000 mg | ORAL_TABLET | Freq: Four times a day (QID) | ORAL | 0 refills | Status: DC | PRN

## 2019-04-08 MED ORDER — SODIUM CHLORIDE 0.9% FLUSH
3.0000 mL | Freq: Once | INTRAVENOUS | Status: AC
  Administered 2019-04-08: 3 mL via INTRAVENOUS

## 2019-04-08 MED ORDER — TRAMADOL HCL 50 MG PO TABS: 50.0000 mg | ORAL_TABLET | Freq: Once | ORAL | Status: DC

## 2019-04-08 MED ORDER — ALBUTEROL SULFATE HFA 108 (90 BASE) MCG/ACT IN AERS
4.0000 | INHALATION_SPRAY | Freq: Once | RESPIRATORY_TRACT | Status: AC
  Administered 2019-04-08: 4 via RESPIRATORY_TRACT
  Filled 2019-04-08: qty 6.7

## 2019-04-08 NOTE — ED Triage Notes (Signed)
Pt began having CP last night & states he dis not have any Nitro at home to take. States that he began "sweating & shaking like a leaf" not long before he called 911. EMS reports that they gave 1 Nitro, NSR on the monitor & pt is allergic to aspirin, Hx of cardiac cath d/t blockage that caused cardiac arrest 3 weeks ago. Pt A/Ox4 upon arrival, c/o CP 10/10 in the Lt chest that radiates into his Lt shoulder & back.

## 2019-04-08 NOTE — Discharge Instructions (Addendum)
Please follow closely with the cardiologist listed.  Return to the emergency department with any new or suddenly worsening symptoms.

## 2019-04-08 NOTE — ED Provider Notes (Signed)
[facility] EMERGENCY DEPARTMENT Provider Note   CSN: [id number] Arrival date & time: 04/08/19  1152     History Chief Complaint  Patient presents with  . Chest Pain    Robert Rich is a 54 y.o. male.  Patient is a 54 year old male who presents with chest pain.  He has a history of COPD, tobacco abuse, diabetes, hyperlipidemia.  He had a cardiac arrest on February 18.  He had a cardiac catheterization following this which showed 50% stenosis of the ostial branch of the LAD.  Otherwise no coronary disease.  It was felt by the cardiology notes that the cardiac arrest was brought on by a respiratory arrest from his COPD, rather than underlying heart disease.  He states that since that time he has been having ongoing pain across his chest.  Its occasionally sharp in nature.  It is worse with movements.  It is worse with deep breathing.  Is been constant since he was in the hospital following his cardiac arrest.  It gets worse at times.  It got worse since last night and has been worse throughout today.  He denies any fevers.  No cough or cold symptoms.  His breathing feels a little worse than it has been.  He is also been having more anxiety since this cardiac arrest occurred.        Past Medical History:  Diagnosis Date  . COPD (chronic obstructive pulmonary disease) (HCC)   . Coronary artery disease   . DM (diabetes mellitus) ([facility])   . Heart attack ([facility])   . HLD (hyperlipidemia)   . HTN (hypertension)   . Obesity   . Stroke [facility])     Patient Active Problem List   Diagnosis Date Noted  . Cardiac arrest ([facility]) 03/17/2019  . 2019 novel coronavirus disease (COVID-19) 12/07/2018  . COPD exacerbation ([facility]) 12/06/2018  . Tobacco abuse 12/06/2018    Past Surgical History:  Procedure Laterality Date  . INTRAVASCULAR PRESSURE WIRE/FFR STUDY N/A 03/18/2019   Procedure: INTRAVASCULAR PRESSURE WIRE/FFR STUDY;  Surgeon: [name], MD;  Location: [facility];   Service: Cardiovascular;  Laterality: N/A;  . LEFT HEART CATH AND CORONARY ANGIOGRAPHY N/A 03/18/2019   Procedure: LEFT HEART CATH AND CORONARY ANGIOGRAPHY;  Surgeon: [name], MD;  Location: [facility];  Service: Cardiovascular;  Laterality: N/A;  . NO PAST SURGERIES         Family History  Problem Relation Age of Onset  . Kidney disease Father     Social History   Tobacco Use  . Smoking status: Current Some Day Smoker    Packs/day: 0.10  . Smokeless tobacco: Never Used  Substance Use Topics  . Alcohol use: Yes    Alcohol/week: 12.0 standard drinks    Types: 12 Cans of beer per week    Comment: Weekly  . Drug use: Not Currently    Home Medications Prior to Admission medications   Medication Sig Start Date End Date Taking? Authorizing Provider  albuterol (VENTOLIN HFA) 108 (90 Base) MCG/ACT inhaler Inhale 2 puffs into the lungs every 6 (six) hours. Patient taking differently: Inhale 2 puffs into the lungs every 6 (six) hours as needed for wheezing or shortness of breath.  12/07/18   [name], MD  ALPRAZolam Prudy Feeler) 0.5 MG tablet Take 0.5 mg by mouth 2 (two) times daily as needed for anxiety.  01/14/18   [provider]  atorvastatin (LIPITOR) 40 MG tablet Take 1 tablet (  40 mg total) by mouth daily at 6 PM. 03/20/19   Arlyce Harman, DO  clopidogrel (PLAVIX) 75 MG tablet Take 1 tablet (75 mg total) by mouth daily. 03/21/19   Arlyce Harman, DO  doxycycline (VIBRA-TABS) 100 MG tablet Take 1 tablet (100 mg total) by mouth every 12 (twelve) hours. 03/20/19   Arlyce Harman, DO  folic acid (FOLVITE) 1 MG tablet Take 1 tablet (1 mg total) by mouth daily. 03/21/19   Arlyce Harman, DO  HYDROcodone-acetaminophen (NORCO/VICODIN) 5-325 MG tablet Take 1 tablet by mouth every 6 (six) hours as needed. 03/28/19   Bethann Berkshire, MD  Ipratropium-Albuterol (COMBIVENT) 20-100 MCG/ACT AERS respimat Inhale 1 puff into the lungs every 6 (six) hours. Patient not taking:  Reported on 03/17/2019 12/07/18   TatOnalee Hua, MD  ipratropium-albuterol (DUONEB) 0.5-2.5 (3) MG/3ML SOLN Inhale 3 mLs into the lungs 4 (four) times daily as needed (for shortness of breath or wheezing).  04/20/18   [provider]  lisinopril (ZESTRIL) 5 MG tablet Take 1 tablet (5 mg total) by mouth daily. 03/21/19   Arlyce Harman, DO  metFORMIN (GLUCOPHAGE) 500 MG tablet Take 1 tablet (500 mg total) by mouth 2 (two) times daily with a meal. 03/20/19 04/19/19  Arlyce Harman, DO  metoprolol succinate (TOPROL-XL) 25 MG 24 hr tablet Take 0.5 tablets (12.5 mg total) by mouth daily. 03/21/19   Arlyce Harman, DO  naproxen (NAPROSYN) 500 MG tablet Take 1 tablet (500 mg total) by mouth 2 (two) times daily with a meal. 03/20/19 04/19/19  Arlyce Harman, DO  predniSONE (DELTASONE) 20 MG tablet Take 2 tablets (40 mg total) by mouth daily with breakfast. 03/21/19   Arlyce Harman, DO  sildenafil (REVATIO) 20 MG tablet Take 20-100 mg by mouth daily as needed (FOR E.D.).     [provider]  thiamine 100 MG tablet Take 2.5 tablets (250 mg total) by mouth daily. 03/20/19   Arlyce Harman, DO  traMADol (ULTRAM) 50 MG tablet Take 1 tablet (50 mg total) by mouth every 6 (six) hours as needed. 04/08/19   Rolan Bucco, MD  umeclidinium-vilanterol (ANORO ELLIPTA) 62.5-25 MCG/INH AEPB Inhale 1 puff into the lungs daily. 03/20/19   Arlyce Harman, DO    Allergies    Zolmitriptan and Asa [aspirin]  Review of Systems   Review of Systems  Constitutional: Positive for fatigue. Negative for chills, diaphoresis and fever.  HENT: Negative for congestion, rhinorrhea and sneezing.   Eyes: Negative.   Respiratory: Positive for shortness of breath. Negative for cough and chest tightness.   Cardiovascular: Positive for chest pain. Negative for leg swelling.  Gastrointestinal: Negative for abdominal pain, blood in stool, diarrhea, nausea and vomiting.  Genitourinary: Negative for difficulty urinating,  flank pain, frequency and hematuria.  Musculoskeletal: Negative for arthralgias and back pain.  Skin: Negative for rash.  Neurological: Negative for dizziness, speech difficulty, weakness, numbness and headaches.  Psychiatric/Behavioral: The patient is nervous/anxious.     Physical Exam Updated Vital Signs BP 125/73   Pulse 73   Temp 98.3 F (36.8 C) (Oral)   Resp 13   SpO2 98%   Physical Exam Constitutional:      Appearance: He is well-developed.  HENT:     Head: Normocephalic and atraumatic.  Eyes:     Pupils: Pupils are equal, round, and reactive to light.  Cardiovascular:     Rate and Rhythm: Normal rate and regular rhythm.     Heart sounds: Normal heart sounds.  Pulmonary:  Effort: Pulmonary effort is normal. No respiratory distress.     Breath sounds: Normal breath sounds. No wheezing or rales.  Chest:     Chest wall: No tenderness.  Abdominal:     General: Bowel sounds are normal.     Palpations: Abdomen is soft.     Tenderness: There is no abdominal tenderness. There is no guarding or rebound.  Musculoskeletal:        General: Normal range of motion.     Cervical back: Normal range of motion and neck supple. Tenderness (Positive tenderness across the sternum and the chest bilaterally, no crepitus or deformity) present.     Comments: No edema or calf tenderness  Lymphadenopathy:     Cervical: No cervical adenopathy.  Skin:    General: Skin is warm and dry.     Findings: No rash.  Neurological:     Mental Status: He is alert and oriented to person, place, and time.     ED Results / Procedures / Treatments   Labs (all labs ordered are listed, but only abnormal results are displayed) Labs Reviewed  BASIC METABOLIC PANEL - Abnormal; Notable for the following components:      Result Value   Glucose, Bld 207 (*)    Calcium 8.8 (*)    All other components within normal limits  CBC - Abnormal; Notable for the following components:   Hemoglobin 12.7 (*)     Platelets 404 (*)    All other components within normal limits  TROPONIN I (HIGH SENSITIVITY)  TROPONIN I (HIGH SENSITIVITY)    EKG EKG Interpretation  Date/Time:  Friday April 08 2019 11:54:00 EST Ventricular Rate:  75 PR Interval:    QRS Duration: 92 QT Interval:  384 QTC Calculation: 429 R Axis:   -10 Text Interpretation: Sinus rhythm since last tracing no significant change Confirmed by Malvin Johns [phone]) on 04/08/2019 11:57:47 AM   Radiology DG Chest 2 View  Result Date: 04/08/2019 CLINICAL DATA:  Chest pain EXAM: CHEST - 2 VIEW COMPARISON:  03/28/2019 FINDINGS: The heart size and mediastinal contours are within normal limits. Calcific aortic knob. No focal airspace consolidation, pleural effusion, or pneumothorax. The visualized skeletal structures are unremarkable. IMPRESSION: No active cardiopulmonary disease. Electronically Signed   By: Davina Poke D.O.   On: 04/08/2019 12:36    Procedures Procedures (including critical care time)  Medications Ordered in ED Medications  sodium chloride flush (NS) 0.9 % injection 3 mL (3 mLs Intravenous Given 04/08/19 1305)  albuterol (VENTOLIN HFA) 108 (90 Base) MCG/ACT inhaler 4 puff (4 puffs Inhalation Given 04/08/19 1304)  ketorolac (TORADOL) 30 MG/ML injection 15 mg (15 mg Intravenous Given 04/08/19 1414)    ED Course  I have reviewed the triage vital signs and the nursing notes.  Pertinent labs & imaging results that were available during my care of the patient were reviewed by me and considered in my medical decision making (see chart for details).    MDM Rules/Calculators/A&P                      Patient is a 54 year old male who presents with chest pain.  This is been going on since he had a recent cardiac arrest.  The cardiac arrest was felt to be secondary to respiratory arrest.  His chest pain is atypical and reproducible on palpation across his chest wall.  It is worse with movement and worse with coughing.  His  chest x-ray is clear without  evidence of pneumonia or pneumothorax.  He had a recent CTA of his chest to rule out PE and this was negative.  He has no ischemic changes on EKG.  He has had a negative troponin.  A second troponin is pending.  If this is negative, I feel that he can be discharged home with outpatient follow-up.  His pain seems to be musculoskeletal in nature and could possibly be related to the CPR that was done.  He was given a prescription for small amount of tramadol.  He was given a referral to follow-up with Dr. Jacinto Halim with cardiology.  Return precautions were given. Final Clinical Impression(s) / ED Diagnoses Final diagnoses:  Atypical chest pain    Rx / DC Orders ED Discharge Orders         Ordered    traMADol (ULTRAM) 50 MG tablet  Every 6 hours PRN     04/08/19 1515           Rolan Bucco, MD 04/08/19 1516

## 2019-04-08 NOTE — ED Provider Notes (Signed)
Blood pressure (!) 118/94, pulse 63, temperature 98.3 F (36.8 C), temperature source Oral, resp. rate (!) 21, SpO2 96 %.  Assuming care from Dr. Fredderick Phenix.  In short, Robert Rich is a 54 y.o. male with a chief complaint of Chest Pain .  Refer to the original H&P for additional details.  The current plan of care is to f/u with repeat troponin and discharge if unchanged.  04:06 PM  Repeat troponin 5. Ready for discharge. Discussed test results with patient along with f/u plan.    Maia Plan, MD 04/08/19 1606

## 2019-04-18 ENCOUNTER — Other Ambulatory Visit: Payer: Self-pay

## 2019-04-18 ENCOUNTER — Ambulatory Visit: Payer: Self-pay | Admitting: Cardiology

## 2019-04-18 ENCOUNTER — Encounter: Payer: Self-pay | Admitting: Cardiology

## 2019-04-18 VITALS — BP 157/86 | HR 71 | Temp 97.4°F | Resp 16 | Ht 69.0 in | Wt 222.0 lb

## 2019-04-18 DIAGNOSIS — J441 Chronic obstructive pulmonary disease with (acute) exacerbation: Secondary | ICD-10-CM

## 2019-04-18 DIAGNOSIS — I469 Cardiac arrest, cause unspecified: Secondary | ICD-10-CM

## 2019-04-18 DIAGNOSIS — I2510 Atherosclerotic heart disease of native coronary artery without angina pectoris: Secondary | ICD-10-CM

## 2019-04-18 DIAGNOSIS — E7800 Pure hypercholesterolemia, unspecified: Secondary | ICD-10-CM

## 2019-04-18 DIAGNOSIS — I10 Essential (primary) hypertension: Secondary | ICD-10-CM

## 2019-04-18 MED ORDER — ATORVASTATIN CALCIUM 40 MG PO TABS: 40.0000 mg | ORAL_TABLET | Freq: Every day | ORAL | 0 refills | Status: DC

## 2019-04-18 MED ORDER — CLOPIDOGREL BISULFATE 75 MG PO TABS: 75.0000 mg | ORAL_TABLET | Freq: Every day | ORAL | 0 refills | Status: DC

## 2019-04-18 MED ORDER — METOPROLOL SUCCINATE ER 25 MG PO TB24: 25.0000 mg | ORAL_TABLET | Freq: Every day | ORAL | 0 refills | Status: DC

## 2019-04-18 MED ORDER — LOSARTAN POTASSIUM 25 MG PO TABS: 25.0000 mg | ORAL_TABLET | Freq: Every day | ORAL | 3 refills | Status: DC

## 2019-04-18 NOTE — Progress Notes (Signed)
Primary Physician/Referring:  Rebecka Apley, NP  Patient ID: Glory Buff, male    DOB: 1965-10-22, 54 y.o.   MRN: [id number]  Chief Complaint  Patient presents with  . Chest Pain    Heart Attack   HPI:    RITCHIE KLEE  is a 54 y.o. Caucasian male with hypertension, hyperlipidemia, new DM, obesity, tobacco use disorder admitted on 03/18/2019 when EMS found him in cardiac arrest and CPR was initiated and patient returned to spontaneous ROSC.  He underwent cardiac catheterization on 03/18/2019 revealing ostial LAD 50% stenosis with FFR negative but otherwise no significant disease.  It was felt that his cardiac arrest was related to respiratory failure with underlying severe COPD.  He now presents here for office visit.  Since hospital discharge except for chest pain on taking deep breath and coughing, he has no respiratory symptoms.  He is completely quit drinking alcohol and also smoking tobacco/cigarettes.  Past Medical History:  Diagnosis Date  . COPD (chronic obstructive pulmonary disease) (HCC)   . Coronary artery disease   . DM (diabetes mellitus) (HCC)   . Heart attack (HCC)   . HLD (hyperlipidemia)   . HTN (hypertension)   . Obesity   . Stroke [facility])    Past Surgical History:  Procedure Laterality Date  . INTRAVASCULAR PRESSURE WIRE/FFR STUDY N/A 03/18/2019   Procedure: INTRAVASCULAR PRESSURE WIRE/FFR STUDY;  Surgeon: [name], MD;  Location: [facility];  Service: Cardiovascular;  Laterality: N/A;  . LEFT HEART CATH AND CORONARY ANGIOGRAPHY N/A 03/18/2019   Procedure: LEFT HEART CATH AND CORONARY ANGIOGRAPHY;  Surgeon: [name], MD;  Location: [facility] INV[facility];  Service: Cardiovascular;  Laterality: N/A;  . NO PAST SURGERIES     Family History  Problem Relation Age of Onset  . Kidney disease Father   . Heart disease Mother   . Heart failure Mother   . Atrial fibrillation Mother     Social History   Tobacco Use  . Smoking status: Former Smoker     Packs/day: 0.10    Years: 20.00    Pack years: 2.00    Types: Cigarettes    Quit date: 03/17/2019    Years since quitting: 0.0  . Smokeless tobacco: Never Used  . Tobacco comment: Quit after recent HA  Substance Use Topics  . Alcohol use: Not Currently    Alcohol/week: 12.0 standard drinks    Types: 12 Cans of beer per week    Comment: Quit after recent HA   ROS  Review of Systems  Cardiovascular: Positive for chest pain (on coughing and tender to touch). Negative for leg swelling.  Respiratory: Positive for cough (chronic) and wheezing (chronic).   Gastrointestinal: Negative for melena.   Objective  Blood pressure (!) 157/86, pulse 71, temperature (!) 97.4 F (36.3 C), temperature source Temporal, resp. rate 16, height 5\' 9"  (1.753 m), weight 222 lb (100.7 kg), SpO2 95 %.  Vitals with BMI 04/18/2019 04/08/2019 04/08/2019  Height 5\' 9"  - -  Weight 222 lbs - -  BMI 32.77 - -  Systolic 157 120 06/08/2019  Diastolic 86 69 94  Pulse 71 79 -     Physical Exam  Cardiovascular: Normal rate, regular rhythm, normal heart sounds and intact distal pulses. Exam reveals no gallop.  No murmur heard. No leg edema, no JVD.  Pulmonary/Chest: Effort normal. He has wheezes (diffuse scattered). He has rales (diffuse scattered). He exhibits tenderness (throughout the precordium.).  Abdominal: Soft.  Bowel sounds are normal.   Laboratory examination:   Recent Labs    03/20/19 0209 03/28/19 1126 04/08/19 1213  NA 136 134* 138  K 4.4 4.3 4.0  CL 97* 102 103  CO2 26 25 24   GLUCOSE 261* 173* 207*  BUN 21* 20 17  CREATININE 0.78 0.57* 0.73  CALCIUM 9.4 8.4* 8.8*  GFRNONAA >60 >60 >60  GFRAA >60 >60 >60   estimated creatinine clearance is 124.9 mL/min (by C-G formula based on SCr of 0.73 mg/dL).  CMP Latest Ref Rng & Units 04/08/2019 03/28/2019 03/20/2019  Glucose 70 - 99 mg/dL 03/22/2019) 756(E) 332(R)  BUN 6 - 20 mg/dL 17 20 518(A)  Creatinine 0.61 - 1.24 mg/dL 41(Y 6.06) 3.01(S  Sodium 135 - 145  mmol/L 138 134(L) 136  Potassium 3.5 - 5.1 mmol/L 4.0 4.3 4.4  Chloride 98 - 111 mmol/L 103 102 97(L)  CO2 22 - 32 mmol/L 24 25 26   Calcium 8.9 - 10.3 mg/dL 0.10) ) 9.4  Total Protein 6.5 - 8.1 g/dL - - 7.1  Total Bilirubin 0.3 - 1.2 mg/dL - - 0.7  Alkaline Phos 38 - 126 U/L - - 49  AST 15 - 41 U/L - - 18  ALT 0 - 44 U/L - - 34   CBC Latest Ref Rng & Units 04/08/2019 03/28/2019 03/20/2019  WBC 4.0 - 10.5 K/uL 9.3 13.4(H) 13.2(H)  Hemoglobin 13.0 - 17.0 g/dL 12.7(L) 13.6 14.1  Hematocrit 39.0 - 52.0 % 39.7 42.1 42.4  Platelets 150 - 400 K/uL 404(H) 439(H) 308   Lipid Panel     Component Value Date/Time   CHOL 185 03/18/2019 0006   TRIG 80 03/18/2019 0006   HDL 61 03/18/2019 0006   CHOLHDL 3.0 03/18/2019 0006   VLDL 16 03/18/2019 0006   LDLCALC 108 (H) 03/18/2019 0006   HEMOGLOBIN A1C Lab Results  Component Value Date   HGBA1C 7.3 (H) 03/18/2019   MPG 162.81 03/18/2019   TSH Recent Labs    03/18/19 0006  TSH 0.487   Medications and allergies   Allergies  Allergen Reactions  . Asa [Aspirin] Hives  . Zolmitriptan Nausea And Vomiting and Other (See Comments)    Severe headaches     Current Outpatient Medications  Medication Instructions  . albuterol (VENTOLIN HFA) 108 (90 Base) MCG/ACT inhaler 2 puffs, Inhalation, Every 6 hours  . ALPRAZolam (XANAX) 0.5 mg, Oral, 2 times daily PRN  . atorvastatin (LIPITOR) 40 mg, Oral, Daily-1800  . clopidogrel (PLAVIX) 75 mg, Oral, Daily  . folic acid (FOLVITE) 1 mg, Oral, Daily  . HYDROcodone-acetaminophen (NORCO/VICODIN) 5-325 MG tablet 1 tablet, Oral, Every 6 hours PRN  . Ipratropium-Albuterol (COMBIVENT) 20-100 MCG/ACT AERS respimat 1 puff, Inhalation, Every 6 hours  . ipratropium-albuterol (DUONEB) 0.5-2.5 (3) MG/3ML SOLN 3 mLs, Inhalation, 4 times daily PRN  . losartan (COZAAR) 25 mg, Oral, Daily  . metFORMIN (GLUCOPHAGE) 500 mg, Oral, 2 times daily with meals  . metoprolol succinate (TOPROL-XL) 25 mg, Oral, Daily  .  naproxen (NAPROSYN) 500 mg, Oral, 2 times daily with meals  . sildenafil (REVATIO) 20-100 mg, Oral, Daily PRN  . thiamine 250 mg, Oral, Daily  . traMADol (ULTRAM) 50 mg, Oral, Every 6 hours PRN  . umeclidinium-vilanterol (ANORO ELLIPTA) 62.5-25 MCG/INH AEPB 1 puff, Inhalation, Daily   Radiology:   No results found.  Cardiac Studies:   Left Heart Catheterization 03/18/19: LV normal LVEF grossly. EDP mardekly elevated Right coronary artery nondominant small, normal. Left main large, normal. Ramus  intermediate large, normal. Circumflex large, dominant, normal. LAD ostial 50% stenosis. FFR 0.89, not hemodynamically significant.  Impression:Patient developed significant respiratory distress probably combination of acute COPD exacerbation and also patient received intravenous adenosine for FFR evaluation. Received albuterol inhalation and also oxygen supplementation with stabilization immediately. Respiratory distress could also be related to combination of acute diastolic heart failure with markedly elevated EDP. Patient also received IV Lasix.  Recommendation:No significant coronary disease, needs primary prevention, consider addition of Plavix as patient is allergic to aspirin in view of moderate coronary artery disease involving the ostial LAD. Primary prevention with aggressive control of diabetes, hypertension and hyperlipidemia is indicated. Suspect is cardiac arrest was secondary to underlying COPD with respiratory distress and severe hypoxemia as evident by similar presentation in the cardiac catheterization lab where his sats dropped down to 70s. Smoking cessation is indicated as well. We will follow up on the echocardiogram.  1. Left ventricular ejection fraction, by estimation, is 55 to 60%. The  left ventricle has normal function. The left ventricle has no regional  wall motion abnormalities. Left ventricular diastolic parameters were  normal.  2. Right ventricular  systolic function is normal. The right ventricular  size is normal.  3. The inferior vena cava is dilated in size with >50% respiratory  variability, suggesting right atrial pressure of 8 mmHg.   EKG  EKG 04/18/2019: Normal sinus rhythm at rate of 69 bpm, left axis deviation, left intrafascicular block.  Poor R wave progression, cannot exclude anteroseptal infarct old.  Nonspecific T abnormality, cannot exclude inferior ischemia.  Normal QT interval.    EKG 04/08/2019: Normal sinus rhythm at the rate of 75 bpm, poor R wave progression, cannot exclude anteroseptal infarct old, otherwise normal EKG.  Assessment     ICD-10-CM   1. Cardiac arrest (HCC)  I46.9 EKG 12-Lead  2. Coronary artery disease involving native coronary artery of native heart without angina pectoris - 50% prox LAD 2021  I25.10 atorvastatin (LIPITOR) 40 MG tablet    clopidogrel (PLAVIX) 75 MG tablet    metoprolol succinate (TOPROL-XL) 25 MG 24 hr tablet  3. COPD exacerbation (HCC)  J44.1   4. Primary hypertension  I10 losartan (COZAAR) 25 MG tablet  5. Hypercholesteremia  E78.00      Meds ordered this encounter  Medications  . atorvastatin (LIPITOR) 40 MG tablet    Sig: Take 1 tablet (40 mg total) by mouth daily at 6 PM.    Dispense:  90 tablet    Refill:  0  . clopidogrel (PLAVIX) 75 MG tablet    Sig: Take 1 tablet (75 mg total) by mouth daily.    Dispense:  90 tablet    Refill:  0  . metoprolol succinate (TOPROL-XL) 25 MG 24 hr tablet    Sig: Take 1 tablet (25 mg total) by mouth daily.    Dispense:  90 tablet    Refill:  0  . losartan (COZAAR) 25 MG tablet    Sig: Take 1 tablet (25 mg total) by mouth daily.    Dispense:  90 tablet    Refill:  3    Medications Discontinued During This Encounter  Medication Reason  . doxycycline (VIBRA-TABS) 100 MG tablet Completed Course  . predniSONE (DELTASONE) 20 MG tablet Completed Course  . lisinopril (ZESTRIL) 5 MG tablet Change in therapy  . atorvastatin  (LIPITOR) 40 MG tablet Reorder  . metoprolol succinate (TOPROL-XL) 25 MG 24 hr tablet Reorder  . clopidogrel (PLAVIX) 75 MG tablet  Recommendations:   SCOTLAND KORVER  is a 54 y.o. Caucasian male with hypertension, hyperlipidemia, new DM, obesity, tobacco use disorder admitted on 03/18/2019 when EMS found him in cardiac arrest and CPR was initiated and patient returned to spontaneous ROSC.  He underwent cardiac catheterization on 03/18/2019 revealing ostial LAD 50% stenosis with FFR negative but otherwise no significant disease.  It was felt that his cardiac arrest was related to respiratory failure with underlying severe COPD.  He now presents here for office visit.  He has completely quit smoking and also drinking alcohol.  He has slightly gained weight since then but he is very motivated to maintain his lifestyle with regard to weight loss as well.  He has been compliant with all his medications.  With regard to coronary artery disease, she only has a moderate disease in the LAD which can be treated by primary prevention, need to continue high intensity moderate dose atorvastatin at 40 mg daily as he is not having side effects.  I refilled the prescription.  He will also continue with Plavix indefinitely as he is allergic to aspirin.  With regard to hypertension, I discontinued lisinopril in view of underlying COPD and cough, will switch him to losartan 25 mg daily.  He will continue with metoprolol succinate 25 mg daily both for hypertension and for underlying moderate coronary artery disease.  His EKG does reveal mild abnormality the inferior wall, do not suspect anything significant from this, probably will resolve over time or it could be just a progression or manifestation of underlying hypertension and hypertensive heart disease.  I am very pleased to see him back in the office today and he is doing very well, underlying COPD continues to be an issue but I suspect this will also improve over  time.  I will see him back on a as needed basis.  Adrian Prows, MD, [facility] 04/18/2019, 3:08 PM [facility] [place] Office: [phone]

## 2019-04-26 ENCOUNTER — Other Ambulatory Visit: Payer: Self-pay

## 2019-04-26 DIAGNOSIS — I2510 Atherosclerotic heart disease of native coronary artery without angina pectoris: Secondary | ICD-10-CM

## 2019-04-26 MED ORDER — NAPROXEN 500 MG PO TABS: 500.0000 mg | ORAL_TABLET | Freq: Every day | ORAL | 3 refills | Status: DC

## 2019-04-26 MED ORDER — METOPROLOL SUCCINATE ER 25 MG PO TB24: 25.0000 mg | ORAL_TABLET | Freq: Every day | ORAL | 2 refills | Status: DC

## 2019-04-26 MED ORDER — CLOPIDOGREL BISULFATE 75 MG PO TABS: 75.0000 mg | ORAL_TABLET | Freq: Every day | ORAL | 2 refills | Status: DC

## 2019-04-26 MED ORDER — ATORVASTATIN CALCIUM 40 MG PO TABS: 40.0000 mg | ORAL_TABLET | Freq: Every day | ORAL | 0 refills | Status: DC

## 2019-04-26 MED ORDER — FOLIC ACID 1 MG PO TABS: 1.0000 mg | ORAL_TABLET | Freq: Every day | ORAL | 3 refills | Status: DC

## 2019-07-18 ENCOUNTER — Encounter (HOSPITAL_COMMUNITY): Payer: Self-pay

## 2019-07-18 ENCOUNTER — Inpatient Hospital Stay (HOSPITAL_COMMUNITY)
Admission: EM | Admit: 2019-07-18 | Discharge: 2019-07-22 | DRG: 190 | Disposition: A | Discharge status: Home / Self Care | Payer: Medicaid Other | Attending: Internal Medicine | Admitting: Internal Medicine

## 2019-07-18 ENCOUNTER — Other Ambulatory Visit: Payer: Self-pay

## 2019-07-18 ENCOUNTER — Emergency Department (HOSPITAL_COMMUNITY): Payer: Medicaid Other

## 2019-07-18 DIAGNOSIS — Z6832 Body mass index (BMI) 32.0-32.9, adult: Secondary | ICD-10-CM | POA: Diagnosis not present

## 2019-07-18 DIAGNOSIS — Z791 Long term (current) use of non-steroidal anti-inflammatories (NSAID): Secondary | ICD-10-CM

## 2019-07-18 DIAGNOSIS — Z9111 Patient's noncompliance with dietary regimen: Secondary | ICD-10-CM

## 2019-07-18 DIAGNOSIS — E6609 Other obesity due to excess calories: Secondary | ICD-10-CM | POA: Diagnosis present

## 2019-07-18 DIAGNOSIS — F1010 Alcohol abuse, uncomplicated: Secondary | ICD-10-CM | POA: Diagnosis present

## 2019-07-18 DIAGNOSIS — E785 Hyperlipidemia, unspecified: Secondary | ICD-10-CM | POA: Diagnosis present

## 2019-07-18 DIAGNOSIS — I252 Old myocardial infarction: Secondary | ICD-10-CM | POA: Diagnosis not present

## 2019-07-18 DIAGNOSIS — Z87891 Personal history of nicotine dependence: Secondary | ICD-10-CM | POA: Diagnosis not present

## 2019-07-18 DIAGNOSIS — Z20822 Contact with and (suspected) exposure to covid-19: Secondary | ICD-10-CM | POA: Diagnosis present

## 2019-07-18 DIAGNOSIS — R5383 Other fatigue: Secondary | ICD-10-CM | POA: Diagnosis present

## 2019-07-18 DIAGNOSIS — J9601 Acute respiratory failure with hypoxia: Secondary | ICD-10-CM

## 2019-07-18 DIAGNOSIS — E1165 Type 2 diabetes mellitus with hyperglycemia: Secondary | ICD-10-CM | POA: Diagnosis present

## 2019-07-18 DIAGNOSIS — Z8674 Personal history of sudden cardiac arrest: Secondary | ICD-10-CM | POA: Diagnosis not present

## 2019-07-18 DIAGNOSIS — F329 Major depressive disorder, single episode, unspecified: Secondary | ICD-10-CM | POA: Diagnosis present

## 2019-07-18 DIAGNOSIS — I10 Essential (primary) hypertension: Secondary | ICD-10-CM | POA: Diagnosis present

## 2019-07-18 DIAGNOSIS — Z8673 Personal history of transient ischemic attack (TIA), and cerebral infarction without residual deficits: Secondary | ICD-10-CM | POA: Diagnosis not present

## 2019-07-18 DIAGNOSIS — D72829 Elevated white blood cell count, unspecified: Secondary | ICD-10-CM | POA: Diagnosis present

## 2019-07-18 DIAGNOSIS — Z888 Allergy status to other drugs, medicaments and biological substances status: Secondary | ICD-10-CM

## 2019-07-18 DIAGNOSIS — J441 Chronic obstructive pulmonary disease with (acute) exacerbation: Secondary | ICD-10-CM

## 2019-07-18 DIAGNOSIS — Z79899 Other long term (current) drug therapy: Secondary | ICD-10-CM

## 2019-07-18 DIAGNOSIS — Z8249 Family history of ischemic heart disease and other diseases of the circulatory system: Secondary | ICD-10-CM

## 2019-07-18 DIAGNOSIS — T50916A Underdosing of multiple unspecified drugs, medicaments and biological substances, initial encounter: Secondary | ICD-10-CM | POA: Diagnosis present

## 2019-07-18 DIAGNOSIS — G44209 Tension-type headache, unspecified, not intractable: Secondary | ICD-10-CM | POA: Diagnosis present

## 2019-07-18 DIAGNOSIS — Z886 Allergy status to analgesic agent status: Secondary | ICD-10-CM

## 2019-07-18 DIAGNOSIS — R0602 Shortness of breath: Secondary | ICD-10-CM | POA: Diagnosis not present

## 2019-07-18 DIAGNOSIS — Z8616 Personal history of COVID-19: Secondary | ICD-10-CM | POA: Diagnosis not present

## 2019-07-18 DIAGNOSIS — F1910 Other psychoactive substance abuse, uncomplicated: Secondary | ICD-10-CM

## 2019-07-18 DIAGNOSIS — T380X5A Adverse effect of glucocorticoids and synthetic analogues, initial encounter: Secondary | ICD-10-CM | POA: Diagnosis present

## 2019-07-18 DIAGNOSIS — F419 Anxiety disorder, unspecified: Secondary | ICD-10-CM | POA: Diagnosis present

## 2019-07-18 DIAGNOSIS — I2510 Atherosclerotic heart disease of native coronary artery without angina pectoris: Secondary | ICD-10-CM | POA: Diagnosis present

## 2019-07-18 DIAGNOSIS — Z91128 Patient's intentional underdosing of medication regimen for other reason: Secondary | ICD-10-CM

## 2019-07-18 DIAGNOSIS — Z7984 Long term (current) use of oral hypoglycemic drugs: Secondary | ICD-10-CM

## 2019-07-18 DIAGNOSIS — Z7902 Long term (current) use of antithrombotics/antiplatelets: Secondary | ICD-10-CM

## 2019-07-18 DIAGNOSIS — I169 Hypertensive crisis, unspecified: Secondary | ICD-10-CM

## 2019-07-18 DIAGNOSIS — R739 Hyperglycemia, unspecified: Secondary | ICD-10-CM

## 2019-07-18 LAB — COMPREHENSIVE METABOLIC PANEL
ALT: 27 U/L (ref 0–44)
AST: 21 U/L (ref 15–41)
Albumin: 4.1 g/dL (ref 3.5–5.0)
Alkaline Phosphatase: 72 U/L (ref 38–126)
Anion gap: 11 (ref 5–15)
BUN: 11 mg/dL (ref 6–20)
CO2: 25 mmol/L (ref 22–32)
Calcium: 8.9 mg/dL (ref 8.9–10.3)
Chloride: 101 mmol/L (ref 98–111)
Creatinine, Ser: 0.63 mg/dL (ref 0.61–1.24)
GFR calc Af Amer: >60 mL/min (ref >60)
GFR calc non Af Amer: >60 mL/min (ref >60)
Glucose, Bld: 247 mg/dL — ABNORMAL HIGH (ref 70–99)
Potassium: 4.2 mmol/L (ref 3.5–5.1)
Sodium: 137 mmol/L (ref 135–145)
Total Bilirubin: 0.3 mg/dL (ref 0.3–1.2)
Total Protein: 7.4 g/dL (ref 6.5–8.1)

## 2019-07-18 LAB — CBC WITH DIFFERENTIAL/PLATELET
Abs Immature Granulocytes: 0.04 10*3/uL (ref 0.00–0.07)
Basophils Absolute: 0.2 10*3/uL — ABNORMAL HIGH (ref 0.0–0.1)
Basophils Relative: 2 %
Eosinophils Absolute: 1.4 10*3/uL — ABNORMAL HIGH (ref 0.0–0.5)
Eosinophils Relative: 13 %
HCT: 42.5 % (ref 39.0–52.0)
Hemoglobin: 13.9 g/dL (ref 13.0–17.0)
Immature Granulocytes: 0 %
Lymphocytes Relative: 28 %
Lymphs Abs: 3.2 10*3/uL (ref 0.7–4.0)
MCH: 28.9 pg (ref 26.0–34.0)
MCHC: 32.7 g/dL (ref 30.0–36.0)
MCV: 88.4 fL (ref 80.0–100.0)
Monocytes Absolute: 0.8 10*3/uL (ref 0.1–1.0)
Monocytes Relative: 7 %
Neutro Abs: 5.8 10*3/uL (ref 1.7–7.7)
Neutrophils Relative %: 50 %
Platelets: 380 10*3/uL (ref 150–400)
RBC: 4.81 MIL/uL (ref 4.22–5.81)
RDW: 13.7 % (ref 11.5–15.5)
WBC: 11.4 10*3/uL — ABNORMAL HIGH (ref 4.0–10.5)
nRBC: 0 % (ref 0.0–0.2)

## 2019-07-18 LAB — CBG MONITORING, ED
Glucose-Capillary: 264 mg/dL — ABNORMAL HIGH (ref 70–99)
Glucose-Capillary: 460 mg/dL — ABNORMAL HIGH (ref 70–99)

## 2019-07-18 LAB — RAPID URINE DRUG SCREEN, HOSP PERFORMED
Amphetamines: NOT DETECTED
Barbiturates: POSITIVE — AB
Benzodiazepines: NOT DETECTED
Cocaine: POSITIVE — AB
Opiates: POSITIVE — AB
Tetrahydrocannabinol: NOT DETECTED

## 2019-07-18 LAB — BRAIN NATRIURETIC PEPTIDE: B Natriuretic Peptide: 54 pg/mL (ref 0.0–100.0)

## 2019-07-18 LAB — TROPONIN I (HIGH SENSITIVITY)
Troponin I (High Sensitivity): 4 ng/L (ref <18)
Troponin I (High Sensitivity): 3 ng/L (ref <18)

## 2019-07-18 LAB — SARS CORONAVIRUS 2 BY RT PCR (HOSPITAL ORDER, PERFORMED IN ~~LOC~~ HOSPITAL LAB): SARS Coronavirus 2: NEGATIVE

## 2019-07-18 LAB — ETHANOL: Alcohol, Ethyl (B): <10 mg/dL (ref <10)

## 2019-07-18 MED ORDER — FOLIC ACID 1 MG PO TABS
1.0000 mg | ORAL_TABLET | Freq: Every day | ORAL | Status: DC
  Administered 2019-07-18 – 2019-07-22 (×5): 1 mg via ORAL
  Filled 2019-07-18 (×5): qty 1

## 2019-07-18 MED ORDER — CHLORHEXIDINE GLUCONATE CLOTH 2 % EX PADS
6.0000 | MEDICATED_PAD | Freq: Every day | CUTANEOUS | Status: DC
  Administered 2019-07-18 – 2019-07-22 (×3): 6 via TOPICAL

## 2019-07-18 MED ORDER — ORAL CARE MOUTH RINSE
15.0000 mL | Freq: Two times a day (BID) | OROMUCOSAL | Status: DC
  Administered 2019-07-19 – 2019-07-22 (×7): 15 mL via OROMUCOSAL

## 2019-07-18 MED ORDER — CLOPIDOGREL BISULFATE 75 MG PO TABS
75.0000 mg | ORAL_TABLET | Freq: Every day | ORAL | Status: DC
  Administered 2019-07-18 – 2019-07-22 (×5): 75 mg via ORAL
  Filled 2019-07-18 (×5): qty 1

## 2019-07-18 MED ORDER — ACETAMINOPHEN 650 MG RE SUPP: 650.0000 mg | Freq: Four times a day (QID) | RECTAL | Status: DC | PRN

## 2019-07-18 MED ORDER — DOXYCYCLINE HYCLATE 100 MG PO TABS
100.0000 mg | ORAL_TABLET | Freq: Two times a day (BID) | ORAL | Status: DC
  Administered 2019-07-18 – 2019-07-22 (×8): 100 mg via ORAL
  Filled 2019-07-18 (×8): qty 1

## 2019-07-18 MED ORDER — ONDANSETRON HCL 4 MG PO TABS: 4.0000 mg | ORAL_TABLET | Freq: Four times a day (QID) | ORAL | Status: DC | PRN

## 2019-07-18 MED ORDER — LORAZEPAM 1 MG PO TABS: 1.0000 mg | ORAL_TABLET | ORAL | Status: AC | PRN

## 2019-07-18 MED ORDER — SODIUM CHLORIDE 0.9% FLUSH
3.0000 mL | Freq: Two times a day (BID) | INTRAVENOUS | Status: DC
  Administered 2019-07-19 – 2019-07-22 (×7): 3 mL via INTRAVENOUS

## 2019-07-18 MED ORDER — THIAMINE HCL 100 MG/ML IJ SOLN
100.0000 mg | Freq: Every day | INTRAMUSCULAR | Status: DC
  Administered 2019-07-18: 100 mg via INTRAVENOUS
  Filled 2019-07-18: qty 2

## 2019-07-18 MED ORDER — THIAMINE HCL 100 MG PO TABS
100.0000 mg | ORAL_TABLET | Freq: Every day | ORAL | Status: DC
  Administered 2019-07-19 – 2019-07-22 (×4): 100 mg via ORAL
  Filled 2019-07-18 (×4): qty 1

## 2019-07-18 MED ORDER — MORPHINE SULFATE (PF) 4 MG/ML IV SOLN
4.0000 mg | Freq: Once | INTRAVENOUS | Status: AC
  Administered 2019-07-18: 4 mg via INTRAVENOUS
  Filled 2019-07-18: qty 1

## 2019-07-18 MED ORDER — ALPRAZOLAM 0.5 MG PO TABS
0.5000 mg | ORAL_TABLET | Freq: Two times a day (BID) | ORAL | Status: DC | PRN
  Administered 2019-07-18 – 2019-07-22 (×5): 0.5 mg via ORAL
  Filled 2019-07-18 (×6): qty 1

## 2019-07-18 MED ORDER — INSULIN ASPART 100 UNIT/ML ~~LOC~~ SOLN
0.0000 [IU] | SUBCUTANEOUS | Status: DC
  Administered 2019-07-18: 11 [IU] via SUBCUTANEOUS
  Administered 2019-07-18: 20 [IU] via SUBCUTANEOUS
  Administered 2019-07-19: 11 [IU] via SUBCUTANEOUS
  Administered 2019-07-19: 7 [IU] via SUBCUTANEOUS
  Administered 2019-07-19: 20 [IU] via SUBCUTANEOUS
  Administered 2019-07-19: 15 [IU] via SUBCUTANEOUS
  Administered 2019-07-19: 11 [IU] via SUBCUTANEOUS
  Administered 2019-07-19: 20 [IU] via SUBCUTANEOUS
  Administered 2019-07-19: 11 [IU] via SUBCUTANEOUS
  Administered 2019-07-20: 15 [IU] via SUBCUTANEOUS
  Administered 2019-07-20: 4 [IU] via SUBCUTANEOUS
  Administered 2019-07-20: 7 [IU] via SUBCUTANEOUS
  Administered 2019-07-21: 20 [IU] via SUBCUTANEOUS
  Administered 2019-07-21 (×2): 11 [IU] via SUBCUTANEOUS
  Administered 2019-07-21: 7 [IU] via SUBCUTANEOUS
  Administered 2019-07-21 (×2): 20 [IU] via SUBCUTANEOUS
  Administered 2019-07-22 (×2): 7 [IU] via SUBCUTANEOUS
  Administered 2019-07-22: 11 [IU] via SUBCUTANEOUS
  Administered 2019-07-22: 7 [IU] via SUBCUTANEOUS
  Filled 2019-07-18 (×2): qty 1

## 2019-07-18 MED ORDER — ACETAMINOPHEN 325 MG PO TABS
650.0000 mg | ORAL_TABLET | Freq: Four times a day (QID) | ORAL | Status: DC | PRN
  Administered 2019-07-20 (×2): 650 mg via ORAL
  Filled 2019-07-18 (×2): qty 2

## 2019-07-18 MED ORDER — SODIUM CHLORIDE 0.9 % IV BOLUS
1000.0000 mL | Freq: Once | INTRAVENOUS | Status: AC
  Administered 2019-07-18: 1000 mL via INTRAVENOUS

## 2019-07-18 MED ORDER — SODIUM CHLORIDE 0.9% FLUSH: 3.0000 mL | INTRAVENOUS | Status: DC | PRN

## 2019-07-18 MED ORDER — LORAZEPAM 2 MG/ML IJ SOLN
1.0000 mg | INTRAMUSCULAR | Status: AC | PRN
  Administered 2019-07-19 (×2): 1 mg via INTRAVENOUS
  Filled 2019-07-18 (×2): qty 1

## 2019-07-18 MED ORDER — ALBUTEROL (5 MG/ML) CONTINUOUS INHALATION SOLN
15.0000 mg/h | INHALATION_SOLUTION | Freq: Once | RESPIRATORY_TRACT | Status: AC
  Administered 2019-07-18: 15 mg/h via RESPIRATORY_TRACT
  Filled 2019-07-18: qty 20

## 2019-07-18 MED ORDER — METHYLPREDNISOLONE SODIUM SUCC 125 MG IJ SOLR
60.0000 mg | Freq: Two times a day (BID) | INTRAMUSCULAR | Status: DC
  Administered 2019-07-18 – 2019-07-20 (×5): 60 mg via INTRAVENOUS
  Filled 2019-07-18 (×5): qty 2

## 2019-07-18 MED ORDER — BUTALBITAL-APAP-CAFFEINE 50-325-40 MG PO TABS
1.0000 | ORAL_TABLET | ORAL | Status: DC | PRN
  Administered 2019-07-18 – 2019-07-22 (×8): 1 via ORAL
  Filled 2019-07-18 (×8): qty 1

## 2019-07-18 MED ORDER — LOSARTAN POTASSIUM 50 MG PO TABS
25.0000 mg | ORAL_TABLET | Freq: Every day | ORAL | Status: DC
  Administered 2019-07-18 – 2019-07-22 (×5): 25 mg via ORAL
  Filled 2019-07-18 (×6): qty 1

## 2019-07-18 MED ORDER — IPRATROPIUM-ALBUTEROL 0.5-2.5 (3) MG/3ML IN SOLN
3.0000 mL | Freq: Four times a day (QID) | RESPIRATORY_TRACT | Status: DC
  Administered 2019-07-18 – 2019-07-22 (×16): 3 mL via RESPIRATORY_TRACT
  Filled 2019-07-18 (×17): qty 3

## 2019-07-18 MED ORDER — ONDANSETRON HCL 4 MG/2ML IJ SOLN: 4.0000 mg | Freq: Four times a day (QID) | INTRAMUSCULAR | Status: DC | PRN

## 2019-07-18 MED ORDER — ENOXAPARIN SODIUM 40 MG/0.4ML ~~LOC~~ SOLN
40.0000 mg | SUBCUTANEOUS | Status: DC
  Administered 2019-07-18 – 2019-07-21 (×4): 40 mg via SUBCUTANEOUS
  Filled 2019-07-18 (×4): qty 0.4

## 2019-07-18 MED ORDER — ADULT MULTIVITAMIN W/MINERALS CH
1.0000 | ORAL_TABLET | Freq: Every day | ORAL | Status: DC
  Administered 2019-07-18 – 2019-07-22 (×5): 1 via ORAL
  Filled 2019-07-18 (×5): qty 1

## 2019-07-18 MED ORDER — ATORVASTATIN CALCIUM 40 MG PO TABS
40.0000 mg | ORAL_TABLET | Freq: Every day | ORAL | Status: DC
  Administered 2019-07-18 – 2019-07-21 (×4): 40 mg via ORAL
  Filled 2019-07-18 (×4): qty 1

## 2019-07-18 MED ORDER — SODIUM CHLORIDE 0.9 % IV SOLN: 250.0000 mL | INTRAVENOUS | Status: DC | PRN

## 2019-07-18 MED ORDER — POTASSIUM CHLORIDE IN NACL 20-0.45 MEQ/L-% IV SOLN
INTRAVENOUS | Status: DC
  Filled 2019-07-18 (×7): qty 1000

## 2019-07-18 NOTE — Progress Notes (Signed)
TRH night shift.  The patient's most recent CBG was 460 mg/dL. He is getting solumedrol 60 mg twice a day. He is on every 4 hrs RI SS coverage and 20 units of coverage will be given. I will add a 1000 mL NS bolus, followed by 0.45%+KCl 20 Meq at 125 mL/hr.   Sanda Klein, MD

## 2019-07-18 NOTE — H&P (Addendum)
History and Physical    Robert Rich:[id number] DOB: 1965-03-02 DOA: 07/18/2019  PCP: Bridget Hartshorn, NP   Patient coming from: Home  Chief Complaint: Shortness of breath  HPI: Robert Rich is a 54 y.o. male with medical history significant for COPD, CAD, CVA, history of prior COVID-19 11/2018, dyslipidemia, type 2 diabetes, alcohol use disorder, and substance abuse with benzodiazepines and prior cocaine who presented to the emergency department with worsening shortness of breath that began over the past 3-4 days.  It has been associated with a productive cough with greenish-yellowish sputum and he also notes some centralized chest tightness especially with movement and coughing.  He denies any radiation of pain or any particular exacerbating or alleviating factors.  He has been trying to take his home inhalers with no relief noted.  He reports that he has been noncompliant with his home medications over the last 3 weeks simply because he did not want to take all of his medications any longer.  EMS had given him IM Solu-Medrol 125 mg prior to arrival and he has received 2 DuoNeb treatments.  He continued to struggle with his breathing requiring BiPAP mask.   ED Course: Stable vital signs noted except for respiratory rate that was slightly elevated.  Some mild leukocytosis of 11,000 noted and glucose of 247.  EKG with sinus rhythm at 90 bpm and Covid testing negative.  1 view chest x-ray with no acute findings and troponin is 4.  BNP is 54.  Patient appears comfortable on BiPAP.  He is complaining of a headache at this time.  Review of Systems: All others reviewed and otherwise negative except as noted above.  Past Medical History:  Diagnosis Date  . COPD (chronic obstructive pulmonary disease) ([place])   . Coronary artery disease   . DM (diabetes mellitus) ([place])   . Heart attack ([place])   . HLD (hyperlipidemia)   . HTN (hypertension)   . Obesity   . Stroke [facility])     Past Surgical  History:  Procedure Laterality Date  . INTRAVASCULAR PRESSURE WIRE/FFR STUDY N/A 03/18/2019   Procedure: INTRAVASCULAR PRESSURE WIRE/FFR STUDY;  Surgeon: Adrian Prows, MD;  Location: [place] CV [facility];  Service: Cardiovascular;  Laterality: N/A;  . LEFT HEART CATH AND CORONARY ANGIOGRAPHY N/A 03/18/2019   Procedure: LEFT HEART CATH AND CORONARY ANGIOGRAPHY;  Surgeon: Adrian Prows, MD;  Location: [place] CV LAB;  Service: Cardiovascular;  Laterality: N/A;  . NO PAST SURGERIES       reports that he quit smoking about 4 months ago. His smoking use included cigarettes. He has a 2.00 pack-year smoking history. He has never used smokeless tobacco. He reports previous alcohol use of about 12.0 standard drinks of alcohol per week. He reports previous drug use.  Allergies  Allergen Reactions  . Asa [Aspirin] Hives  . Zolmitriptan Nausea And Vomiting and Other (See Comments)    Severe headaches    Family History  Problem Relation Age of Onset  . Kidney disease Father   . Heart disease Mother   . Heart failure Mother   . Atrial fibrillation Mother     Prior to Admission medications   Medication Sig Start Date End Date Taking? Authorizing Provider  albuterol (VENTOLIN HFA) 108 (90 Base) MCG/ACT inhaler Inhale 2 puffs into the lungs every 6 (six) hours. Patient taking differently: Inhale 2 puffs into the lungs every 6 (six) hours as needed for wheezing or shortness of breath.  12/07/18  Yes  [name], MD  ALPRAZolam Prudy Feeler) 0.5 MG tablet Take 0.5 mg by mouth 2 (two) times daily as needed for anxiety.  01/14/18  Yes [provider]  atorvastatin (LIPITOR) 40 MG tablet Take 1 tablet (40 mg total) by mouth daily at 6 PM. 04/26/19  Yes [name], MD  clopidogrel (PLAVIX) 75 MG tablet Take 1 tablet (75 mg total) by mouth daily. 04/26/19  Yes [name], MD  folic acid (FOLVITE) 1 MG tablet Take 1 tablet (1 mg total) by mouth daily. 04/26/19  Yes [name], MD  losartan (COZAAR) 25 MG tablet  Take 1 tablet (25 mg total) by mouth daily. 04/18/19 07/18/19 Yes [name], MD  metFORMIN (GLUCOPHAGE) 500 MG tablet Take 1 tablet (500 mg total) by mouth 2 (two) times daily with a meal. 03/20/19 07/18/19 Yes Lockamy, Timothy, DO  naproxen (NAPROSYN) 500 MG tablet Take 1 tablet (500 mg total) by mouth daily. 04/26/19  Yes [name], MD  HYDROcodone-acetaminophen (NORCO/VICODIN) 5-325 MG tablet Take 1 tablet by mouth every 6 (six) hours as needed. Patient not taking: Reported on 07/18/2019 03/28/19   Bethann Berkshire, MD  Ipratropium-Albuterol (COMBIVENT) 20-100 MCG/ACT AERS respimat Inhale 1 puff into the lungs every 6 (six) hours. Patient not taking: Reported on 07/18/2019 12/07/18   TatOnalee Hua, MD  ipratropium-albuterol (DUONEB) 0.5-2.5 (3) MG/3ML SOLN Inhale 3 mLs into the lungs 4 (four) times daily as needed (for shortness of breath or wheezing).  Patient not taking: Reported on 07/18/2019 04/20/18   [provider]  metoprolol succinate (TOPROL-XL) 25 MG 24 hr tablet Take 1 tablet (25 mg total) by mouth daily. Patient not taking: Reported on 07/18/2019 04/26/19   [name], MD  sildenafil (REVATIO) 20 MG tablet Take 20-100 mg by mouth daily as needed (FOR E.D.).  Patient not taking: Reported on 07/18/2019    [provider]  thiamine 100 MG tablet Take 2.5 tablets (250 mg total) by mouth daily. Patient not taking: Reported on 07/18/2019 03/20/19   Arlyce Harman, DO  traMADol (ULTRAM) 50 MG tablet Take 1 tablet (50 mg total) by mouth every 6 (six) hours as needed. Patient not taking: Reported on 07/18/2019 04/08/19   Rolan Bucco, MD  umeclidinium-vilanterol (ANORO ELLIPTA) 62.5-25 MCG/INH AEPB Inhale 1 puff into the lungs daily. Patient not taking: Reported on 07/18/2019 03/20/19   Arlyce Harman, DO    Physical Exam: Vitals:   07/18/19 1330 07/18/19 1400 07/18/19 1430 07/18/19 1500  BP: 138/84 123/75 135/78 (!) 133/91  Pulse: 82 77 91 90  Resp: 17 16 16 18   Temp:      SpO2:  100% 100% 100% 100%  Weight:      Height:        Constitutional: NAD, calm, comfortable Vitals:   07/18/19 1330 07/18/19 1400 07/18/19 1430 07/18/19 1500  BP: 138/84 123/75 135/78 (!) 133/91  Pulse: 82 77 91 90  Resp: 17 16 16 18   Temp:      SpO2: 100% 100% 100% 100%  Weight:      Height:       Eyes: lids and conjunctivae normal ENMT: Mucous membranes are moist.  Neck: normal, supple Respiratory: Bilateral wheezing noted.  Currently on BiPAP 40% 12/6. Cardiovascular: Regular rate and rhythm, no murmurs. No extremity edema. Abdomen: no tenderness, no distention. Bowel sounds positive.  Musculoskeletal:  No joint deformity upper and lower extremities.   Skin: no rashes, lesions, ulcers.  Psychiatric: Normal judgment and insight. Alert and oriented x 3. Normal mood.  Labs on Admission: I have personally reviewed following labs and imaging studies  CBC: Recent Labs  Lab 07/18/19 1140  WBC 11.4*  NEUTROABS 5.8  HGB 13.9  HCT 42.5  MCV 88.4  PLT 380   Basic Metabolic Panel: Recent Labs  Lab 07/18/19 1140  NA 137  K 4.2  CL 101  CO2 25  GLUCOSE 247*  BUN 11  CREATININE 0.63  CALCIUM 8.9   GFR: Estimated Creatinine Clearance: 118.9 mL/min (by C-G formula based on SCr of 0.63 mg/dL). Liver Function Tests: Recent Labs  Lab 07/18/19 1140  AST 21  ALT 27  ALKPHOS 72  BILITOT 0.3  PROT 7.4  ALBUMIN 4.1   No results for input(s): LIPASE, AMYLASE in the last 168 hours. No results for input(s): AMMONIA in the last 168 hours. Coagulation Profile: No results for input(s): INR, PROTIME in the last 168 hours. Cardiac Enzymes: No results for input(s): CKTOTAL, CKMB, CKMBINDEX, TROPONINI in the last 168 hours. BNP (last 3 results) No results for input(s): PROBNP in the last 8760 hours. HbA1C: No results for input(s): HGBA1C in the last 72 hours. CBG: No results for input(s): GLUCAP in the last 168 hours. Lipid Profile: No results for input(s): CHOL, HDL,  LDLCALC, TRIG, CHOLHDL, LDLDIRECT in the last 72 hours. Thyroid Function Tests: No results for input(s): TSH, T4TOTAL, FREET4, T3FREE, THYROIDAB in the last 72 hours. Anemia Panel: No results for input(s): VITAMINB12, FOLATE, FERRITIN, TIBC, IRON, RETICCTPCT in the last 72 hours. Urine analysis:    Component Value Date/Time   COLORURINE YELLOW 10/09/2017 1819   APPEARANCEUR CLEAR 10/09/2017 1819   LABSPEC 1.021 10/09/2017 1819   PHURINE 5.0 10/09/2017 1819   GLUCOSEU NEGATIVE 10/09/2017 1819   HGBUR NEGATIVE 10/09/2017 1819   BILIRUBINUR NEGATIVE 10/09/2017 1819   KETONESUR NEGATIVE 10/09/2017 1819   PROTEINUR NEGATIVE 10/09/2017 1819   NITRITE NEGATIVE 10/09/2017 1819   LEUKOCYTESUR NEGATIVE 10/09/2017 1819    Radiological Exams on Admission: DG Chest Portable 1 View  Result Date: 07/18/2019 CLINICAL DATA:  Cough and shortness of breath 3 days. Chest pain. COPD. EXAM: PORTABLE CHEST 1 VIEW COMPARISON:  04/08/2018 FINDINGS: The heart size and mediastinal contours are within normal limits. Both lungs are clear. The visualized skeletal structures are unremarkable. IMPRESSION: No active disease. Electronically Signed   By: Danae Orleans M.D.   On: 07/18/2019 11:55    EKG: Independently reviewed.  90 bpm with sinus rhythm.  Assessment/Plan Active Problems:   Acute exacerbation of chronic obstructive pulmonary disease (COPD) (HCC)    Acute hypoxemic respiratory failure secondary to acute COPD exacerbation -Currently on BiPAP and will wean as tolerated -DuoNebs every 6 hours -Chest x-ray without any signs of infection, but patient is noted to have some bronchitis with mild leukocytosis and therefore will start on oral doxycycline -Check procalcitonin -Urine strep pneumonia and Legionella evaluation -Covid test negative -IV methylprednisolone 60 mg IV twice daily -Monitor in stepdown unit  Medication noncompliance -Patient has discontinued medications for the last 3 weeks due to  fatigue -Encourage compliance and the need to call physician prior to any adjustments to medications  Headache-likely tension -We will try Fioricet  History of CAD/CVA with recent cardiac arrest -Continue Plavix, statin, and metoprolol  History of type 2 diabetes with hyperglycemia -Hold Metformin while inpatient and maintain on SSI with aggressive dosing -Carb modified diet  History of alcohol use disorder as well as polysubstance abuse -Maintain on CIWA protocol while hospitalized -Check UDS and alcohol level  DVT prophylaxis: Lovenox Code Status: Full Family Communication: None at bedside Disposition Plan: Treatment of COPD exacerbation Consults called: None Admission status: Inpatient, stepdown Status is: Inpatient  Remains inpatient appropriate because:IV treatments appropriate due to intensity of illness or inability to take PO and Inpatient level of care appropriate due to severity of illness   Dispo: The patient is from: Home              Anticipated d/c is to: Home              Anticipated d/c date is: 2 days              Patient currently is not medically stable to d/c.   Ahliya Glatt D Sherryll Burger DO [facility]  If 7PM-7AM, please contact night-coverage [email or web address]  07/18/2019, 3:44 PM

## 2019-07-18 NOTE — Progress Notes (Signed)
**Note De-identified Ronee Ranganathan Obfuscation** Patient removed from BIPAP and placed on 4L [place], tolerating well.  RRT to continue to monitor. ?

## 2019-07-18 NOTE — ED Triage Notes (Addendum)
Pt brought in by EMS in resp distress. Sob since Friday and worsened this am. Pt received 2 duoneb and shot of solumedrol en route. Pt coughing excessively. Reports CP. Pt has HX ofCOPD. Pt stopped taking meds 3 weeks ago

## 2019-07-18 NOTE — ED Notes (Signed)
Dr. Robb Matar notified of pt's CBG of 460. Order given to give 20 units Novolog and start IVF.

## 2019-07-18 NOTE — ED Provider Notes (Signed)
Emergency Department Provider Note   I have reviewed the triage vital signs and the nursing notes.   HISTORY  Chief Complaint Respiratory Distress   HPI Robert Rich is a 54 y.o. male with past history of CAD, diabetes, hypertension, high cholesterol, COPD, recent cardiac arrest presents to the emergency department with shortness of breath worsening significantly this morning.  He has had shortness of breath symptoms over the past 4 days but this morning symptoms became severe.  He has a central chest tightness without radiation.  He became diaphoretic and ultimately called EMS after trying his inhalers at home with no lasting relief.  EMS arrived to find him with significant increased work of breathing and wheezing.  He received 2 duo nebs in route and IM Solu-Medrol 125 PTA.  He is on oxygen for comfort on arrival. No fever or chills. He has not been vaccinated against COVID 19. No abdominal or back pain.    Past Medical History:  Diagnosis Date  . COPD (chronic obstructive pulmonary disease) ([place])   . Coronary artery disease   . DM (diabetes mellitus) ([place])   . Heart attack ([place])   . HLD (hyperlipidemia)   . HTN (hypertension)   . Obesity   . Stroke [facility])     Patient Active Problem List   Diagnosis Date Noted  . Acute exacerbation of chronic obstructive pulmonary disease (COPD) ([place]) 07/18/2019  . Cardiac arrest ([place]) 03/17/2019  . 2019 novel coronavirus disease (COVID-19) 12/07/2018  . COPD exacerbation ([place]) 12/06/2018  . Tobacco abuse 12/06/2018    Past Surgical History:  Procedure Laterality Date  . INTRAVASCULAR PRESSURE WIRE/FFR STUDY N/A 03/18/2019   Procedure: INTRAVASCULAR PRESSURE WIRE/FFR STUDY;  Surgeon: Adrian Prows, MD;  Location: [facility];  Service: Cardiovascular;  Laterality: N/A;  . LEFT HEART CATH AND CORONARY ANGIOGRAPHY N/A 03/18/2019   Procedure: LEFT HEART CATH AND CORONARY ANGIOGRAPHY;  Surgeon: Adrian Prows, MD;  Location: [facility];  Service: Cardiovascular;  Laterality: N/A;  . NO PAST SURGERIES      Allergies Asa [aspirin] and Zolmitriptan  Family History  Problem Relation Age of Onset  . Kidney disease Father   . Heart disease Mother   . Heart failure Mother   . Atrial fibrillation Mother     Social History Social History   Tobacco Use  . Smoking status: Former Smoker    Packs/day: 0.10    Years: 20.00    Pack years: 2.00    Types: Cigarettes    Quit date: 03/17/2019    Years since quitting: 0.3  . Smokeless tobacco: Never Used  . Tobacco comment: Quit after recent HA  Vaping Use  . Vaping Use: Never used  Substance Use Topics  . Alcohol use: Not Currently    Alcohol/week: 12.0 standard drinks    Types: 12 Cans of beer per week    Comment: Quit after recent HA  . Drug use: Not Currently    Review of Systems  Constitutional: No fever/chills Eyes: No visual changes. ENT: No sore throat. Cardiovascular: Positive chest pain. Respiratory: Positive shortness of breath. Gastrointestinal: No abdominal pain.  No nausea, no vomiting.  No diarrhea.  No constipation. Genitourinary: Negative for dysuria. Musculoskeletal: Negative for back pain. Skin: Negative for rash. Neurological: Negative for headaches, focal weakness or numbness.  10-point ROS otherwise negative.  ____________________________________________   PHYSICAL EXAM:  VITAL SIGNS: Temp: 98.6 F HR: 91 RR: 35 BP: 150/90 SpO2: 96% on BiPAP  Constitutional: Alert but in notable respiratory distress.  He is speaking in 2-3 word sentences with increased WOB. Diaphoresis noted.  Eyes: Conjunctivae are normal. Head: Atraumatic. Nose: No congestion/rhinnorhea. Mouth/Throat: Mucous membranes are moist.   Neck: No stridor.  Cardiovascular: Normal rate, regular rhythm. Good peripheral circulation. Grossly normal heart sounds.   Respiratory: Increased respiratory effort. Positive retractions. Lungs diminished bilaterally with  end-expiratory wheezing. No rales or rhonchi.  Gastrointestinal: Soft and nontender. No distention.  Musculoskeletal: No lower extremity tenderness nor edema. No gross deformities of extremities. Neurologic:  Normal speech and language.  Skin:  Skin is warm, dry and intact. No rash noted.  ____________________________________________   LABS (all labs ordered are listed, but only abnormal results are displayed)  Labs Reviewed  COMPREHENSIVE METABOLIC PANEL - Abnormal; Notable for the following components:      Result Value   Glucose, Bld 247 (*)    All other components within normal limits  CBC WITH DIFFERENTIAL/PLATELET - Abnormal; Notable for the following components:   WBC 11.4 (*)    Eosinophils Absolute 1.4 (*)    Basophils Absolute 0.2 (*)    All other components within normal limits  HEMOGLOBIN A1C - Abnormal; Notable for the following components:   Hgb A1c MFr Bld 9.4 (*)    All other components within normal limits  RAPID URINE DRUG SCREEN, HOSP PERFORMED - Abnormal; Notable for the following components:   Opiates POSITIVE (*)    Cocaine POSITIVE (*)    Barbiturates POSITIVE (*)    All other components within normal limits  GLUCOSE, CAPILLARY - Abnormal; Notable for the following components:   Glucose-Capillary 397 (*)    All other components within normal limits  BASIC METABOLIC PANEL - Abnormal; Notable for the following components:   Glucose, Bld 251 (*)    Creatinine, Ser 0.60 (*)    All other components within normal limits  CBC - Abnormal; Notable for the following components:   WBC 15.0 (*)    All other components within normal limits  GLUCOSE, CAPILLARY - Abnormal; Notable for the following components:   Glucose-Capillary 237 (*)    All other components within normal limits  GLUCOSE, CAPILLARY - Abnormal; Notable for the following components:   Glucose-Capillary 252 (*)    All other components within normal limits  GLUCOSE, CAPILLARY - Abnormal; Notable  for the following components:   Glucose-Capillary 436 (*)    All other components within normal limits  CBG MONITORING, ED - Abnormal; Notable for the following components:   Glucose-Capillary 264 (*)    All other components within normal limits  CBG MONITORING, ED - Abnormal; Notable for the following components:   Glucose-Capillary 460 (*)    All other components within normal limits  SARS CORONAVIRUS 2 BY RT PCR (HOSPITAL ORDER, PERFORMED IN [facility])  MRSA PCR SCREENING  BRAIN NATRIURETIC PEPTIDE  ETHANOL  MAGNESIUM  LEGIONELLA PNEUMOPHILA SEROGP 1 UR AG  STREP PNEUMONIAE URINARY ANTIGEN  TROPONIN I (HIGH SENSITIVITY)  TROPONIN I (HIGH SENSITIVITY)   ____________________________________________  EKG   EKG Interpretation  Date/Time:  Monday July 18 2019 11:40:13 EDT Ventricular Rate:  90 PR Interval:    QRS Duration: 86 QT Interval:  351 QTC Calculation: 430 R Axis:   21 Text Interpretation: Sinus rhythm Anteroseptal infarct, old Baseline wander in lead(s) II III aVF V2 Respiratory variation noted No STEMI Confirmed by Alona Bene [id number]) on 07/18/2019 12:13:11 PM       ____________________________________________  RADIOLOGY  DG Chest Portable 1 View  Result Date: 07/18/2019 CLINICAL DATA:  Cough and shortness of breath 3 days. Chest pain. COPD. EXAM: PORTABLE CHEST 1 VIEW COMPARISON:  04/08/2018 FINDINGS: The heart size and mediastinal contours are within normal limits. Both lungs are clear. The visualized skeletal structures are unremarkable. IMPRESSION: No active disease. Electronically Signed   By: Danae Orleans M.D.   On: 07/18/2019 11:55    ____________________________________________   PROCEDURES  Procedure(s) performed:   Procedures  CRITICAL CARE Performed by: Maia Plan Total critical care time: 35 minutes Critical care time was exclusive of separately billable procedures and treating other patients. Critical care was necessary  to treat or prevent imminent or life-threatening deterioration. Critical care was time spent personally by me on the following activities: development of treatment plan with patient and/or surrogate as well as nursing, discussions with consultants, evaluation of patient's response to treatment, examination of patient, obtaining history from patient or surrogate, ordering and performing treatments and interventions, ordering and review of laboratory studies, ordering and review of radiographic studies, pulse oximetry and re-evaluation of patient's condition.  Alona Bene, MD Emergency Medicine  ____________________________________________   INITIAL IMPRESSION / ASSESSMENT AND PLAN / ED COURSE  Pertinent labs & imaging results that were available during my care of the patient were reviewed by me and considered in my medical decision making (see chart for details).   Patient presents to the emergency department in moderate respiratory distress.  He has had 2 duo nebs and Solu-Medrol in addition to his home albuterol prior to arrival.  His EKG shows no ischemia but baseline wander due to respiratory variation.  He has not been vaccinated for Covid but has not had infection symptoms.  Will swab for Covid here.  Chest x-ray reviewed and clear without infiltrate or PNX.  Patient placed on BiPAP and continuous albuterol started. Considered CAD/PE but feel this is less likely clinically. Will re-evaluate. EKG interpreted by me as above.   12:05 PM  Patient reassessed.  He is tolerating BiPAP well.  He is no longer diaphoretic and respiratory rate is decreased. Wheezing improved.   Discussed patient's case with TRH to request admission. Patient and family (if present) updated with plan. Care transferred to [facility] service.  I reviewed all nursing notes, vitals, pertinent old records, EKGs, labs, imaging (as available).  ____________________________________________  FINAL CLINICAL IMPRESSION(S) / ED  DIAGNOSES  Final diagnoses:  Acute respiratory failure with hypoxia (HCC)  COPD exacerbation (HCC)     MEDICATIONS GIVEN DURING THIS VISIT:  Medications  ipratropium-albuterol (DUONEB) 0.5-2.5 (3) MG/3ML nebulizer solution 3 mL (3 mLs Nebulization Given 07/19/19 0908)  methylPREDNISolone sodium succinate (SOLU-MEDROL) 125 mg/2 mL injection 60 mg (60 mg Intravenous Given 07/19/19 0252)  insulin aspart (novoLOG) injection 0-20 Units (11 Units Subcutaneous Given 07/19/19 0803)  butalbital-acetaminophen-caffeine (FIORICET) 50-325-40 MG per tablet 1 tablet (1 tablet Oral Given 07/19/19 1053)  LORazepam (ATIVAN) tablet 1-4 mg ( Oral See Alternative 07/19/19 0027)    Or  LORazepam (ATIVAN) injection 1-4 mg (1 mg Intravenous Given 07/19/19 0027)  thiamine tablet 100 mg (100 mg Oral Given 07/19/19 0803)    Or  thiamine (B-1) injection 100 mg ( Intravenous See Alternative 07/19/19 0803)  folic acid (FOLVITE) tablet 1 mg (1 mg Oral Given 07/19/19 0803)  multivitamin with minerals tablet 1 tablet (1 tablet Oral Given 07/19/19 0804)  doxycycline (VIBRA-TABS) tablet 100 mg (100 mg Oral Given 07/19/19 0803)  atorvastatin (LIPITOR) tablet 40 mg (40 mg Oral Given  07/18/19 1710)  losartan (COZAAR) tablet 25 mg (25 mg Oral Given 07/19/19 0803)  ALPRAZolam Prudy Feeler) tablet 0.5 mg (0.5 mg Oral Given 07/18/19 1741)  clopidogrel (PLAVIX) tablet 75 mg (75 mg Oral Given 07/19/19 0803)  enoxaparin (LOVENOX) injection 40 mg (40 mg Subcutaneous Given 07/18/19 1709)  sodium chloride flush (NS) 0.9 % injection 3 mL (3 mLs Intravenous Given 07/19/19 0804)  sodium chloride flush (NS) 0.9 % injection 3 mL (has no administration in time range)  0.9 %  sodium chloride infusion (has no administration in time range)  acetaminophen (TYLENOL) tablet 650 mg (has no administration in time range)    Or  acetaminophen (TYLENOL) suppository 650 mg (has no administration in time range)  ondansetron (ZOFRAN) tablet 4 mg (has no administration in  time range)    Or  ondansetron (ZOFRAN) injection 4 mg (has no administration in time range)  Chlorhexidine Gluconate Cloth 2 % PADS 6 each (6 each Topical Given 07/19/19 0804)  MEDLINE mouth rinse (15 mLs Mouth Rinse Given 07/19/19 0804)  insulin glargine (LANTUS) injection 20 Units (has no administration in time range)  insulin aspart (novoLOG) injection 5 Units (has no administration in time range)  metoprolol succinate (TOPROL-XL) 24 hr tablet 25 mg (has no administration in time range)  albuterol (PROVENTIL,VENTOLIN) solution continuous neb (15 mg/hr Nebulization Given 07/18/19 1147)  morphine 4 MG/ML injection 4 mg (4 mg Intravenous Given 07/18/19 1342)  sodium chloride 0.9 % bolus 1,000 mL (0 mLs Intravenous Stopping Infusion hung by another clincian 07/18/19 2235)    Note:  This document was prepared using Dragon voice recognition software and may include unintentional dictation errors.  Alona Bene, MD, [facility] Emergency Medicine    Kemauri Musa, Arlyss Repress, MD 07/19/19 1150

## 2019-07-19 DIAGNOSIS — J441 Chronic obstructive pulmonary disease with (acute) exacerbation: Principal | ICD-10-CM

## 2019-07-19 LAB — HEMOGLOBIN A1C
Hgb A1c MFr Bld: 9.4 % — ABNORMAL HIGH (ref 4.8–5.6)
Mean Plasma Glucose: 223 mg/dL

## 2019-07-19 LAB — GLUCOSE, CAPILLARY
Glucose-Capillary: 236 mg/dL — ABNORMAL HIGH (ref 70–99)
Glucose-Capillary: 237 mg/dL — ABNORMAL HIGH (ref 70–99)
Glucose-Capillary: 252 mg/dL — ABNORMAL HIGH (ref 70–99)
Glucose-Capillary: 277 mg/dL — ABNORMAL HIGH (ref 70–99)
Glucose-Capillary: 350 mg/dL — ABNORMAL HIGH (ref 70–99)
Glucose-Capillary: 397 mg/dL — ABNORMAL HIGH (ref 70–99)
Glucose-Capillary: 436 mg/dL — ABNORMAL HIGH (ref 70–99)

## 2019-07-19 LAB — CBC
HCT: 40.8 % (ref 39.0–52.0)
Hemoglobin: 13 g/dL (ref 13.0–17.0)
MCH: 28.2 pg (ref 26.0–34.0)
MCHC: 31.9 g/dL (ref 30.0–36.0)
MCV: 88.5 fL (ref 80.0–100.0)
Platelets: 363 10*3/uL (ref 150–400)
RBC: 4.61 MIL/uL (ref 4.22–5.81)
RDW: 13.8 % (ref 11.5–15.5)
WBC: 15 10*3/uL — ABNORMAL HIGH (ref 4.0–10.5)
nRBC: 0 % (ref 0.0–0.2)

## 2019-07-19 LAB — BASIC METABOLIC PANEL
Anion gap: 12 (ref 5–15)
BUN: 17 mg/dL (ref 6–20)
CO2: 22 mmol/L (ref 22–32)
Calcium: 8.9 mg/dL (ref 8.9–10.3)
Chloride: 102 mmol/L (ref 98–111)
Creatinine, Ser: 0.6 mg/dL — ABNORMAL LOW (ref 0.61–1.24)
GFR calc Af Amer: >60 mL/min (ref >60)
GFR calc non Af Amer: >60 mL/min (ref >60)
Glucose, Bld: 251 mg/dL — ABNORMAL HIGH (ref 70–99)
Potassium: 4.3 mmol/L (ref 3.5–5.1)
Sodium: 136 mmol/L (ref 135–145)

## 2019-07-19 LAB — MRSA PCR SCREENING: MRSA by PCR: NEGATIVE

## 2019-07-19 LAB — STREP PNEUMONIAE URINARY ANTIGEN: Strep Pneumo Urinary Antigen: NEGATIVE

## 2019-07-19 LAB — MAGNESIUM: Magnesium: 2.1 mg/dL (ref 1.7–2.4)

## 2019-07-19 MED ORDER — INSULIN ASPART 100 UNIT/ML ~~LOC~~ SOLN
5.0000 [IU] | Freq: Three times a day (TID) | SUBCUTANEOUS | Status: DC
  Administered 2019-07-19 – 2019-07-20 (×5): 5 [IU] via SUBCUTANEOUS

## 2019-07-19 MED ORDER — IPRATROPIUM-ALBUTEROL 0.5-2.5 (3) MG/3ML IN SOLN
3.0000 mL | RESPIRATORY_TRACT | Status: DC | PRN
  Administered 2019-07-19: 3 mL via RESPIRATORY_TRACT
  Filled 2019-07-19: qty 3

## 2019-07-19 MED ORDER — INSULIN ASPART 100 UNIT/ML ~~LOC~~ SOLN
10.0000 [IU] | Freq: Once | SUBCUTANEOUS | Status: AC
  Administered 2019-07-19: 10 [IU] via SUBCUTANEOUS

## 2019-07-19 MED ORDER — INSULIN GLARGINE 100 UNIT/ML ~~LOC~~ SOLN
20.0000 [IU] | Freq: Every day | SUBCUTANEOUS | Status: DC
  Administered 2019-07-19 – 2019-07-20 (×2): 20 [IU] via SUBCUTANEOUS
  Filled 2019-07-19 (×3): qty 0.2

## 2019-07-19 MED ORDER — METOPROLOL SUCCINATE ER 25 MG PO TB24
25.0000 mg | ORAL_TABLET | Freq: Every day | ORAL | Status: DC
  Administered 2019-07-19 – 2019-07-22 (×4): 25 mg via ORAL
  Filled 2019-07-19 (×4): qty 1

## 2019-07-19 NOTE — Progress Notes (Signed)
PROGRESS NOTE    [name]  WEX:[id number] DOB: Jun 06, 1965 DOA: 07/18/2019 PCP: Bridget Hartshorn, NP   Brief Narrative:  Per HPI: Robert Rich is a 54 y.o. male with medical history significant for COPD, CAD, CVA, history of prior COVID-19 11/2018, dyslipidemia, type 2 diabetes, alcohol use disorder, and substance abuse with benzodiazepines and prior cocaine who presented to the emergency department with worsening shortness of breath that began over the past 3-4 days.  It has been associated with a productive cough with greenish-yellowish sputum and he also notes some centralized chest tightness especially with movement and coughing.  He denies any radiation of pain or any particular exacerbating or alleviating factors.  He has been trying to take his home inhalers with no relief noted.  He reports that he has been noncompliant with his home medications over the last 3 weeks simply because he did not want to take all of his medications any longer.  EMS had given him IM Solu-Medrol 125 mg prior to arrival and he has received 2 DuoNeb treatments.  He continued to struggle with his breathing requiring BiPAP mask.   ED Course: Stable vital signs noted except for respiratory rate that was slightly elevated.  Some mild leukocytosis of 11,000 noted and glucose of 247.  EKG with sinus rhythm at 90 bpm and Covid testing negative.  1 view chest x-ray with no acute findings and troponin is 4.  BNP is 54.  Patient appears comfortable on BiPAP.  He is complaining of a headache at this time.   Assessment & Plan:   Active Problems:   Acute exacerbation of chronic obstructive pulmonary disease (COPD) (HCC)   Acute hypoxemic respiratory failure secondary to acute COPD exacerbation-improving -Off BiPAP and may transfer to telemetry today -DuoNebs every 6 hours -Chest x-ray without any signs of infection, but patient is noted to have some bronchitis with mild leukocytosis and therefore will start on  oral doxycycline -Urine strep pneumonia and Legionella evaluation still pending -Covid test negative -IV methylprednisolone 60 mg IV twice daily -Incentive spirometry and flutter valve and up to chair  Medication noncompliance -Patient has discontinued medications for the last 3 weeks due to fatigue -Encourage compliance and the need to call physician prior to any adjustments to medications  Headache-likely tension-improving -Continue Fioricet as needed  History of CAD/CVA with recent cardiac arrest -Continue Plavix, statin, and metoprolol  History of type 2 diabetes with steroid-induced hyperglycemia -Hold Metformin while inpatient and maintain on SSI with aggressive dosing every 4 hours -Added Lantus 20 units daily as well as NovoLog 5 units 3 times daily with meals per diabetes coordinator recommendations -Hemoglobin A1c 9.4% increased from 7.3% previously, due to dietary and medication noncompliance -Carb modified diet  History of alcohol use disorder as well as polysubstance abuse -Maintain on CIWA protocol while hospitalized -Alcohol level WNL and no signs of withdrawal currently noted -UDS positive for narcotics, barbiturates, and cocaine.  Patient claims not to be using any illicit substances.   DVT prophylaxis: Lovenox Code Status: Full Family Communication: None at bedside Disposition Plan: Continue treatment for COPD exacerbation and wean oxygen as tolerated. Status is: Inpatient  Remains inpatient appropriate because:IV treatments appropriate due to intensity of illness or inability to take PO and Inpatient level of care appropriate due to severity of illness   Dispo: The patient is from: Home              Anticipated d/c is to: Home  Anticipated d/c date is: 2 days              Patient currently is not medically stable to d/c.  He continues to have ongoing oxygen need.  He is noted to have persistent shortness of breath and  wheezing.  Consultants:   None  Procedures:   See below  Antimicrobials:  Anti-infectives (From admission, onward)   Start     Dose/Rate Route Frequency Ordered Stop   07/18/19 2200  doxycycline (VIBRA-TABS) tablet 100 mg     Discontinue     100 mg Oral Every 12 hours 07/18/19 1602         Subjective: Patient seen and evaluated today with improvement in the shortness of breath noted.  He is still coughing and wheezing some, but has been weaned off of his BiPAP.  Objective: Vitals:   07/19/19 0900 07/19/19 0909 07/19/19 1000 07/19/19 1100  BP: (!) 163/89  (!) 155/79 (!) 156/75  Pulse: 93  96 (!) 107  Resp: 19  15 (!) 21  Temp: 98 F (36.7 C)     TempSrc:      SpO2: 95% 96% 95% 93%  Weight:      Height:        Intake/Output Summary (Last 24 hours) at 07/19/2019 1108 Last data filed at 07/19/2019 1000 Gross per 24 hour  Intake 2897.04 ml  Output 1450 ml  Net 1447.04 ml   Filed Weights   07/18/19 1135 07/18/19 2147 07/19/19 0500  Weight: 90.7 kg 101.1 kg 101.1 kg    Examination:  General exam: Appears calm and comfortable  Respiratory system: Bilateral wheezing noted.  Currently on 4 L nasal cannula oxygen.Marland Kitchen Respiratory effort normal. Cardiovascular system: S1 & S2 heard, RRR. No JVD, murmurs, rubs, gallops or clicks. No pedal edema. Gastrointestinal system: Abdomen is nondistended, soft and nontender. No organomegaly or masses felt. Normal bowel sounds heard. Central nervous system: Alert and oriented. No focal neurological deficits. Extremities: Symmetric 5 x 5 power. Skin: No rashes, lesions or ulcers Psychiatry: Judgement and insight appear normal. Mood & affect appropriate.     Data Reviewed: I have personally reviewed following labs and imaging studies  CBC: Recent Labs  Lab 07/18/19 1140 07/19/19 0406  WBC 11.4* 15.0*  NEUTROABS 5.8  --   HGB 13.9 13.0  HCT 42.5 40.8  MCV 88.4 88.5  PLT 380 363   Basic Metabolic Panel: Recent Labs  Lab  07/18/19 1140 07/19/19 0406  NA 137 136  K 4.2 4.3  CL 101 102  CO2 25 22  GLUCOSE 247* 251*  BUN 11 17  CREATININE 0.63 0.60*  CALCIUM 8.9 8.9  MG  --  2.1   GFR: Estimated Creatinine Clearance: 125.2 mL/min (A) (by C-G formula based on SCr of 0.6 mg/dL (L)). Liver Function Tests: Recent Labs  Lab 07/18/19 1140  AST 21  ALT 27  ALKPHOS 72  BILITOT 0.3  PROT 7.4  ALBUMIN 4.1   No results for input(s): LIPASE, AMYLASE in the last 168 hours. No results for input(s): AMMONIA in the last 168 hours. Coagulation Profile: No results for input(s): INR, PROTIME in the last 168 hours. Cardiac Enzymes: No results for input(s): CKTOTAL, CKMB, CKMBINDEX, TROPONINI in the last 168 hours. BNP (last 3 results) No results for input(s): PROBNP in the last 8760 hours. HbA1C: Recent Labs    07/18/19 1140  HGBA1C 9.4*   CBG: Recent Labs  Lab 07/18/19 1625 07/18/19 2010 07/19/19 0004 07/19/19 0436 07/19/19  0742  GLUCAP 264* 460* 397* 237* 252*   Lipid Profile: No results for input(s): CHOL, HDL, LDLCALC, TRIG, CHOLHDL, LDLDIRECT in the last 72 hours. Thyroid Function Tests: No results for input(s): TSH, T4TOTAL, FREET4, T3FREE, THYROIDAB in the last 72 hours. Anemia Panel: No results for input(s): VITAMINB12, FOLATE, FERRITIN, TIBC, IRON, RETICCTPCT in the last 72 hours. Sepsis Labs: No results for input(s): PROCALCITON, LATICACIDVEN in the last 168 hours.  Recent Results (from the past 240 hour(s))  SARS Coronavirus 2 by RT PCR (hospital order, performed in [facility] hospital lab) Nasopharyngeal Nasopharyngeal Swab     Status: None   Collection Time: 07/18/19 11:56 AM   Specimen: Nasopharyngeal Swab  Result Value Ref Range Status   SARS Coronavirus 2 NEGATIVE NEGATIVE Final    Comment: (NOTE) SARS-CoV-2 target nucleic acids are NOT DETECTED.  The SARS-CoV-2 RNA is generally detectable in upper and lower respiratory specimens during the acute phase of infection. The  lowest concentration of SARS-CoV-2 viral copies this assay can detect is 250 copies / mL. A negative result does not preclude SARS-CoV-2 infection and should not be used as the sole basis for treatment or other patient management decisions.  A negative result may occur with improper specimen collection / handling, submission of specimen other than nasopharyngeal swab, presence of viral mutation(s) within the areas targeted by this assay, and inadequate number of viral copies (<250 copies / mL). A negative result must be combined with clinical observations, patient history, and epidemiological information.  Fact Sheet for Patients:   BoilerBrush.com.cy  Fact Sheet for Healthcare Providers: https://pope.com/  This test is not yet approved or  cleared by the [place] FDA and has been authorized for detection and/or diagnosis of SARS-CoV-2 by FDA under an Emergency Use Authorization (EUA).  This EUA will remain in effect (meaning this test can be used) for the duration of the COVID-19 declaration under Section 564(b)(1) of the Act, 21 U.S.C. section 360bbb-3(b)(1), unless the authorization is terminated or revoked sooner.  Performed at [facility], [address] [place], [place] [address]   MRSA PCR Screening     Status: None   Collection Time: 07/18/19  9:38 PM   Specimen: Nasal Mucosa; Nasopharyngeal  Result Value Ref Range Status   MRSA by PCR NEGATIVE NEGATIVE Final    Comment:        The GeneXpert MRSA Assay (FDA approved for NASAL specimens only), is one component of a comprehensive MRSA colonization surveillance program. It is not intended to diagnose MRSA infection nor to guide or monitor treatment for MRSA infections. Performed at [facility], [address] [place], [place] [address]          Radiology Studies: DG Chest Portable 1 View  Result Date: 07/18/2019 CLINICAL DATA:  Cough and shortness of  breath 3 days. Chest pain. COPD. EXAM: PORTABLE CHEST 1 VIEW COMPARISON:  04/08/2018 FINDINGS: The heart size and mediastinal contours are within normal limits. Both lungs are clear. The visualized skeletal structures are unremarkable. IMPRESSION: No active disease. Electronically Signed   By: Danae Orleans M.D.   On: 07/18/2019 11:55        Scheduled Meds: . atorvastatin  40 mg Oral q1800  . Chlorhexidine Gluconate Cloth  6 each Topical Daily  . clopidogrel  75 mg Oral Daily  . doxycycline  100 mg Oral Q12H  . enoxaparin (LOVENOX) injection  40 mg Subcutaneous Q24H  . folic acid  1 mg Oral Daily  . insulin aspart  0-20 Units Subcutaneous Q4H  . insulin aspart  5 Units Subcutaneous TID WC  . insulin glargine  20 Units Subcutaneous Daily  . ipratropium-albuterol  3 mL Nebulization Q6H  . losartan  25 mg Oral Daily  . mouth rinse  15 mL Mouth Rinse BID  . methylPREDNISolone (SOLU-MEDROL) injection  60 mg Intravenous Q12H  . multivitamin with minerals  1 tablet Oral Daily  . sodium chloride flush  3 mL Intravenous Q12H  . thiamine  100 mg Oral Daily   Or  . thiamine  100 mg Intravenous Daily   Continuous Infusions: . sodium chloride       LOS: 1 day    Time spent: 35 minutes    Robbin Loughmiller Hoover Brunette, DO [facility]  If 7PM-7AM, please contact night-coverage [email or web address] 07/19/2019, 11:08 AM

## 2019-07-19 NOTE — Progress Notes (Signed)
Inpatient Diabetes Program Recommendations  AACE/ADA: New Consensus Statement on Inpatient Glycemic Control (2015)  Target Ranges:  Prepandial:   less than 140 mg/dL      Peak postprandial:   less than 180 mg/dL (1-2 hours)      Critically ill patients:  140 - 180 mg/dL   Lab Results  Component Value Date   GLUCAP 252 (H) 07/19/2019   HGBA1C 9.4 (H) 07/18/2019    Review of Glycemic Control Results for Robert Rich, Robert Rich (MRN [id number]) as of 07/19/2019 09:34  Ref. Range 07/18/2019 16:25 07/18/2019 20:10 07/19/2019 00:04 07/19/2019 04:36 07/19/2019 07:42  Glucose-Capillary Latest Ref Range: 70 - 99 mg/dL 621 (H) 947 (H) 125 (H) 237 (H) 252 (H)   Diabetes history: DM2 Outpatient Diabetes medications: Metformin 500 mg bid Current orders for Inpatient glycemic control: Novolog resistant correction q 4 hrs. + Solumedrol 60 mg q 12 hrs  Inpatient Diabetes Program Recommendations:   While on steroids: -Lantus 20 units (0.2 units/kg x 101.1 kg ) -Novolog 5 units tid meal coverage if eats 50%  Noted A1c increased from 7.3 on 08/15/19 to currently 9.4.  Thank you, Billy Fischer. Zaide Kardell, RN, MSN, CDE  Diabetes Coordinator Inpatient Glycemic Control Team Team Pager [phone] (8am-5pm) 07/19/2019 9:42 AM

## 2019-07-20 DIAGNOSIS — J9601 Acute respiratory failure with hypoxia: Secondary | ICD-10-CM

## 2019-07-20 DIAGNOSIS — E1165 Type 2 diabetes mellitus with hyperglycemia: Secondary | ICD-10-CM

## 2019-07-20 LAB — CBC
HCT: 40.2 % (ref 39.0–52.0)
Hemoglobin: 12.9 g/dL — ABNORMAL LOW (ref 13.0–17.0)
MCH: 29.1 pg (ref 26.0–34.0)
MCHC: 32.1 g/dL (ref 30.0–36.0)
MCV: 90.5 fL (ref 80.0–100.0)
Platelets: 348 10*3/uL (ref 150–400)
RBC: 4.44 MIL/uL (ref 4.22–5.81)
RDW: 14 % (ref 11.5–15.5)
WBC: 18.3 10*3/uL — ABNORMAL HIGH (ref 4.0–10.5)
nRBC: 0 % (ref 0.0–0.2)

## 2019-07-20 LAB — GLUCOSE, RANDOM: Glucose, Bld: 421 mg/dL — ABNORMAL HIGH (ref 70–99)

## 2019-07-20 LAB — BASIC METABOLIC PANEL
Anion gap: 10 (ref 5–15)
BUN: 22 mg/dL — ABNORMAL HIGH (ref 6–20)
CO2: 27 mmol/L (ref 22–32)
Calcium: 8.7 mg/dL — ABNORMAL LOW (ref 8.9–10.3)
Chloride: 99 mmol/L (ref 98–111)
Creatinine, Ser: 0.69 mg/dL (ref 0.61–1.24)
GFR calc Af Amer: >60 mL/min (ref >60)
GFR calc non Af Amer: >60 mL/min (ref >60)
Glucose, Bld: 211 mg/dL — ABNORMAL HIGH (ref 70–99)
Potassium: 4.2 mmol/L (ref 3.5–5.1)
Sodium: 136 mmol/L (ref 135–145)

## 2019-07-20 LAB — GLUCOSE, CAPILLARY
Glucose-Capillary: 197 mg/dL — ABNORMAL HIGH (ref 70–99)
Glucose-Capillary: 231 mg/dL — ABNORMAL HIGH (ref 70–99)
Glucose-Capillary: 549 mg/dL (ref 70–99)
Glucose-Capillary: 303 mg/dL — ABNORMAL HIGH (ref 70–99)
Glucose-Capillary: 419 mg/dL — ABNORMAL HIGH (ref 70–99)

## 2019-07-20 MED ORDER — INSULIN ASPART 100 UNIT/ML ~~LOC~~ SOLN
25.0000 [IU] | Freq: Once | SUBCUTANEOUS | Status: AC
  Administered 2019-07-20: 25 [IU] via SUBCUTANEOUS

## 2019-07-20 MED ORDER — INSULIN GLARGINE 100 UNIT/ML ~~LOC~~ SOLN
15.0000 [IU] | Freq: Two times a day (BID) | SUBCUTANEOUS | Status: DC
  Administered 2019-07-20: 15 [IU] via SUBCUTANEOUS
  Filled 2019-07-20 (×2): qty 0.15

## 2019-07-20 MED ORDER — BUDESONIDE 0.5 MG/2ML IN SUSP
0.5000 mg | Freq: Two times a day (BID) | RESPIRATORY_TRACT | Status: DC
  Administered 2019-07-20 – 2019-07-22 (×5): 0.5 mg via RESPIRATORY_TRACT
  Filled 2019-07-20 (×5): qty 2

## 2019-07-20 MED ORDER — METHYLPREDNISOLONE SODIUM SUCC 40 MG IJ SOLR
40.0000 mg | Freq: Two times a day (BID) | INTRAMUSCULAR | Status: DC
  Administered 2019-07-21 – 2019-07-22 (×3): 40 mg via INTRAVENOUS
  Filled 2019-07-20 (×3): qty 1

## 2019-07-20 MED ORDER — INSULIN ASPART 100 UNIT/ML ~~LOC~~ SOLN
20.0000 [IU] | Freq: Once | SUBCUTANEOUS | Status: AC
  Administered 2019-07-20: 20 [IU] via SUBCUTANEOUS

## 2019-07-20 MED ORDER — HYDROCODONE-HOMATROPINE 5-1.5 MG/5ML PO SYRP
5.0000 mL | ORAL_SOLUTION | Freq: Three times a day (TID) | ORAL | Status: DC | PRN
  Administered 2019-07-20 – 2019-07-22 (×3): 5 mL via ORAL
  Filled 2019-07-20 (×3): qty 5

## 2019-07-20 MED ORDER — INSULIN ASPART 100 UNIT/ML ~~LOC~~ SOLN: 15.0000 [IU] | Freq: Once | SUBCUTANEOUS | Status: DC

## 2019-07-20 NOTE — Progress Notes (Addendum)
Inpatient Diabetes Program Recommendations  AACE/ADA: New Consensus Statement on Inpatient Glycemic Control (2015)  Target Ranges:  Prepandial:   less than 140 mg/dL      Peak postprandial:   less than 180 mg/dL (1-2 hours)      Critically ill patients:  140 - 180 mg/dL   Results for DIOR, STEPTER (MRN [id number]) as of 07/20/2019 12:10  Ref. Range 07/19/2019 23:38 07/20/2019 04:30 07/20/2019 07:24 07/20/2019 11:27  Glucose-Capillary Latest Ref Range: 70 - 99 mg/dL 409 (H)  7 units NOVOLOG  197 (H)  4 units NOVOLOG  231 (H)  12 units NOVOLOG +  20 units LANTUS  549 (HH)  25 units NOVOLOG      Home DM Meds: Metformin 500 mg BID  Current Orders: Lantus 20 units daily      Novolog Resistant Correction Scale/ SSI (0-20 units) Q4 hours      Novolog 5 units TID with meals     MD- Note patient getting Solumedrol 60 mg BID.  Note that Lantus and Novolog Meal Coverage both started yesterday.  CBG 549 at 12pm today.  May consider increasing Novolog Meal Coverage to 10 units TID with meals while patient remains on IV Solumedrol  Would keep Novolog SSI at Q4 hours for now while CBGs remain severely elevated  May also consider increasing Lantus to 25 units Daily     --Will follow patient during hospitalization--  Ambrose Finland RN, MSN, CDE Diabetes Coordinator Inpatient Glycemic Control Team Team Pager: [phone] (8a-5p)

## 2019-07-20 NOTE — Progress Notes (Signed)
PROGRESS NOTE    Robert Rich  PVX:[id number] DOB: 1965-01-31 DOA: 07/18/2019 PCP: Rebecka Apley, NP   Brief Narrative:  Per HPI: Robert Rich is a 54 y.o. male with medical history significant for COPD, CAD, CVA, history of prior COVID-19 11/2018, dyslipidemia, type 2 diabetes, alcohol use disorder, and substance abuse with benzodiazepines and prior cocaine who presented to the emergency department with worsening shortness of breath that began over the past 3-4 days.  It has been associated with a productive cough with greenish-yellowish sputum and he also notes some centralized chest tightness especially with movement and coughing.  He denies any radiation of pain or any particular exacerbating or alleviating factors.  He has been trying to take his home inhalers with no relief noted.  He reports that he has been noncompliant with his home medications over the last 3 weeks simply because he did not want to take all of his medications any longer.  EMS had given him IM Solu-Medrol 125 mg prior to arrival and he has received 2 DuoNeb treatments.  He continued to struggle with his breathing requiring BiPAP mask.   ED Course: Stable vital signs noted except for respiratory rate that was slightly elevated.  Some mild leukocytosis of 11,000 noted and glucose of 247.  EKG with sinus rhythm at 90 bpm and Covid testing negative.  1 view chest x-ray with no acute findings and troponin is 4.  BNP is 54.  Patient appears comfortable on BiPAP.  He is complaining of a headache at this time.   Assessment & Plan:   Active Problems:   Acute exacerbation of chronic obstructive pulmonary disease (COPD) (HCC)   Acute hypoxemic respiratory failure secondary to acute COPD exacerbation-improving -continue weaning O2 off. -start pulmicort. -DuoNebs every 6 hours -Chest x-ray without any signs of infection, but patient is noted to have some bronchitis/bronchiectasis, continue doxycycline. -Urine strep  pneumonia and Legionella evaluation still pending -Covid test negative -IV methylprednisolone 40 mg IV twice daily -Incentive spirometry and flutter valve and increase activity  Medication noncompliance -Patient has discontinued medications for the last 3 weeks due to fatigue -Encourage compliance and the need to call physician prior to any adjustments to medications  Headache-likely tension-improving -Continue Fioricet as needed -no HA's currently  History of CAD/CVA with recent cardiac arrest -Continue Plavix, statin, and metoprolol -no CP -continue risk factors modifications.  History of type 2 diabetes with steroid-induced hyperglycemia -Hold Metformin while inpatient and maintain on SSI with aggressive dosing every 4 hours -Lantus dose adjusted to 15units BID and continue NovoLog 5 units 3 times daily with meals. -Hemoglobin A1c 9.4% increased from 7.3% previously, due to dietary and medication noncompliance -Carb modified diet encouraged.  History of alcohol use disorder as well as polysubstance abuse -Maintain on CIWA protocol while hospitalized -Alcohol level WNL and no signs of withdrawal currently noted -UDS positive for narcotics, barbiturates, and cocaine.  Patient claims not to be using any illicit substances. -reports been interested in lifestyle changes and looking to quit.   DVT prophylaxis: Lovenox Code Status: Full Family Communication: None at bedside Disposition Plan: Continue treatment for COPD exacerbation and wean oxygen as tolerated. Started on pulmicort and flutter valve. Increased lantus dose for better CBG's control.  Status is: Inpatient   Dispo: The patient is from: Home              Anticipated d/c is to: Home  Anticipated d/c date is: 1-2 days.              Patient currently is not medically stable to d/c.  He continues to have ongoing oxygen need (intermittently), having difficulty to speak in full sentences, uncontrolled  CBG's and diffuse wheezing.  Consultants:   None  Procedures:   See below  Antimicrobials:  Anti-infectives (From admission, onward)   Start     Dose/Rate Route Frequency Ordered Stop   07/18/19 2200  doxycycline (VIBRA-TABS) tablet 100 mg     Discontinue     100 mg Oral Every 12 hours 07/18/19 1602        Subjective: No fever, no CP. Still complaining of SOB, difficulty speaking in full sentences, diffuse wheezing reported and uncontrolled dry coughing spells.   Objective: Vitals:   07/20/19 1328 07/20/19 1508 07/20/19 1959 07/20/19 2034  BP: (!) 150/77   134/77  Pulse: 69   89  Resp: 20   20  Temp: 98.3 F (36.8 C)   99.1 F (37.3 C)  TempSrc: Oral   Oral  SpO2: 98% 95% 96% 95%  Weight:      Height:        Intake/Output Summary (Last 24 hours) at 07/20/2019 2208 Last data filed at 07/20/2019 2100 Gross per 24 hour  Intake 240 ml  Output 3450 ml  Net -3210 ml   Filed Weights   07/18/19 1135 07/18/19 2147 07/19/19 0500  Weight: 90.7 kg 101.1 kg 101.1 kg    Examination: General exam: Alert, awake, oriented x 3; still complaining of SOB with minimal exertion and until last night requiring O2 supplementation. Mild difficulty speaking in full sentences.no fever, no CP, complaining of diffuse dry cough overnight. Respiratory system: poor air movement, positive expiratory wheezing, no crackles. Cardiovascular system: RRR. No murmurs, rubs or gallops. Gastrointestinal system: Abdomen is obese, nondistended, soft and nontender. No organomegaly or masses felt. Normal bowel sounds heard. Central nervous system: Alert and oriented. No focal neurological deficits. Extremities: No Cyanosis, no clubbing. Skin: No rashes, no petechiae. Psychiatry: Judgement and insight appear normal. Mood & affect appropriate.     Data Reviewed: I have personally reviewed following labs and imaging studies  CBC: Recent Labs  Lab 07/18/19 1140 07/19/19 0406 07/20/19 0458  WBC 11.4*  15.0* 18.3*  NEUTROABS 5.8  --   --   HGB 13.9 13.0 12.9*  HCT 42.5 40.8 40.2  MCV 88.4 88.5 90.5  PLT 380 363 785   Basic Metabolic Panel: Recent Labs  Lab 07/18/19 1140 07/19/19 0406 07/20/19 0458 07/20/19 2107  NA 137 136 136  --   K 4.2 4.3 4.2  --   CL 101 102 99  --   CO2 25 22 27   --   GLUCOSE 247* 251* 211* 421*  BUN 11 17 22*  --   CREATININE 0.63 0.60* 0.69  --   CALCIUM 8.9 8.9 8.7*  --   MG  --  2.1  --   --    GFR: Estimated Creatinine Clearance: 125.2 mL/min (by C-G formula based on SCr of 0.69 mg/dL). Liver Function Tests: Recent Labs  Lab 07/18/19 1140  AST 21  ALT 27  ALKPHOS 72  BILITOT 0.3  PROT 7.4  ALBUMIN 4.1   HbA1C: Recent Labs    07/18/19 1140  HGBA1C 9.4*   CBG: Recent Labs  Lab 07/20/19 0430 07/20/19 0724 07/20/19 1127 07/20/19 1701 07/20/19 2029  GLUCAP 197* 231* 549* 303* 419*   Lipid  Profile: No results for input(s): CHOL, HDL, LDLCALC, TRIG, CHOLHDL, LDLDIRECT in the last 72 hours. Thyroid Function Tests: No results for input(s): TSH, T4TOTAL, FREET4, T3FREE, THYROIDAB in the last 72 hours. Anemia Panel: No results for input(s): VITAMINB12, FOLATE, FERRITIN, TIBC, IRON, RETICCTPCT in the last 72 hours. Sepsis Labs: No results for input(s): PROCALCITON, LATICACIDVEN in the last 168 hours.  Recent Results (from the past 240 hour(s))  SARS Coronavirus 2 by RT PCR (hospital order, performed in [facility] hospital lab) Nasopharyngeal Nasopharyngeal Swab     Status: None   Collection Time: 07/18/19 11:56 AM   Specimen: Nasopharyngeal Swab  Result Value Ref Range Status   SARS Coronavirus 2 NEGATIVE NEGATIVE Final    Comment: (NOTE) SARS-CoV-2 target nucleic acids are NOT DETECTED.  The SARS-CoV-2 RNA is generally detectable in upper and lower respiratory specimens during the acute phase of infection. The lowest concentration of SARS-CoV-2 viral copies this assay can detect is 250 copies / mL. A negative result does  not preclude SARS-CoV-2 infection and should not be used as the sole basis for treatment or other patient management decisions.  A negative result may occur with improper specimen collection / handling, submission of specimen other than nasopharyngeal swab, presence of viral mutation(s) within the areas targeted by this assay, and inadequate number of viral copies (<250 copies / mL). A negative result must be combined with clinical observations, patient history, and epidemiological information.  Fact Sheet for Patients:   BoilerBrush.com.cy  Fact Sheet for Healthcare Providers: https://pope.com/  This test is not yet approved or  cleared by the [place] FDA and has been authorized for detection and/or diagnosis of SARS-CoV-2 by FDA under an Emergency Use Authorization (EUA).  This EUA will remain in effect (meaning this test can be used) for the duration of the COVID-19 declaration under Section 564(b)(1) of the Act, 21 U.S.C. section 360bbb-3(b)(1), unless the authorization is terminated or revoked sooner.  Performed at [facility], [address], [place], [place] [address]   MRSA PCR Screening     Status: None   Collection Time: 07/18/19  9:38 PM   Specimen: Nasal Mucosa; Nasopharyngeal  Result Value Ref Range Status   MRSA by PCR NEGATIVE NEGATIVE Final    Comment:        The GeneXpert MRSA Assay (FDA approved for NASAL specimens only), is one component of a comprehensive MRSA colonization surveillance program. It is not intended to diagnose MRSA infection nor to guide or monitor treatment for MRSA infections. Performed at [facility], [address] [place], [place] [address]          Radiology Studies: No results found.      Scheduled Meds: . atorvastatin  40 mg Oral q1800  . budesonide (PULMICORT) nebulizer solution  0.5 mg Nebulization BID  . Chlorhexidine Gluconate Cloth  6 each Topical Daily  .  clopidogrel  75 mg Oral Daily  . doxycycline  100 mg Oral Q12H  . enoxaparin (LOVENOX) injection  40 mg Subcutaneous Q24H  . folic acid  1 mg Oral Daily  . insulin aspart  0-20 Units Subcutaneous Q4H  . insulin aspart  20 Units Subcutaneous Once  . insulin aspart  5 Units Subcutaneous TID WC  . insulin glargine  15 Units Subcutaneous BID  . ipratropium-albuterol  3 mL Nebulization Q6H  . losartan  25 mg Oral Daily  . mouth rinse  15 mL Mouth Rinse BID  . [START ON 07/21/2019] methylPREDNISolone (SOLU-MEDROL) injection  40 mg Intravenous Q12H  . metoprolol succinate  25 mg Oral Daily  . multivitamin with minerals  1 tablet Oral Daily  . sodium chloride flush  3 mL Intravenous Q12H  . thiamine  100 mg Oral Daily   Or  . thiamine  100 mg Intravenous Daily   Continuous Infusions: . sodium chloride       LOS: 2 days    Time spent: 30 minutes    [name], MD [facility]  If 7PM-7AM, please contact night-coverage [email or web address] 07/20/2019, 10:08 PM

## 2019-07-20 NOTE — Progress Notes (Signed)
Patient CBG 419. STAT lab glucose obtained. Glucose 421. Midlevel notified. Order placed for Novolog 20 units once given.

## 2019-07-21 LAB — GLUCOSE, CAPILLARY
Glucose-Capillary: 234 mg/dL — ABNORMAL HIGH (ref 70–99)
Glucose-Capillary: 247 mg/dL — ABNORMAL HIGH (ref 70–99)
Glucose-Capillary: 251 mg/dL — ABNORMAL HIGH (ref 70–99)
Glucose-Capillary: 293 mg/dL — ABNORMAL HIGH (ref 70–99)
Glucose-Capillary: 357 mg/dL — ABNORMAL HIGH (ref 70–99)
Glucose-Capillary: 371 mg/dL — ABNORMAL HIGH (ref 70–99)
Glucose-Capillary: 374 mg/dL — ABNORMAL HIGH (ref 70–99)

## 2019-07-21 MED ORDER — INSULIN GLARGINE 100 UNIT/ML ~~LOC~~ SOLN
18.0000 [IU] | Freq: Two times a day (BID) | SUBCUTANEOUS | Status: DC
  Administered 2019-07-21 – 2019-07-22 (×3): 18 [IU] via SUBCUTANEOUS
  Filled 2019-07-21 (×9): qty 0.18

## 2019-07-21 MED ORDER — INSULIN ASPART 100 UNIT/ML ~~LOC~~ SOLN
10.0000 [IU] | Freq: Three times a day (TID) | SUBCUTANEOUS | Status: DC
  Administered 2019-07-21 – 2019-07-22 (×3): 10 [IU] via SUBCUTANEOUS

## 2019-07-21 MED ORDER — INSULIN ASPART 100 UNIT/ML ~~LOC~~ SOLN
8.0000 [IU] | Freq: Three times a day (TID) | SUBCUTANEOUS | Status: DC
  Administered 2019-07-21 (×2): 8 [IU] via SUBCUTANEOUS

## 2019-07-21 NOTE — Plan of Care (Signed)
  Problem: Health Behavior/Discharge Planning: Goal: Ability to manage health-related needs will improve Outcome: Progressing   Problem: Activity: Goal: Risk for activity intolerance will decrease Outcome: Adequate for Discharge   Problem: Nutrition: Goal: Adequate nutrition will be maintained Outcome: Adequate for Discharge   Problem: Coping: Goal: Level of anxiety will decrease Outcome: Progressing   Problem: Elimination: Goal: Will not experience complications related to bowel motility Outcome: Adequate for Discharge

## 2019-07-21 NOTE — Progress Notes (Signed)
PROGRESS NOTE    Robert Rich  TKP:[id number] DOB: 1965/08/26 DOA: 07/18/2019 PCP: Rebecka Apley, NP   Brief Narrative:  Per HPI: Robert Rich is a 54 y.o. male with medical history significant for COPD, CAD, CVA, history of prior COVID-19 11/2018, dyslipidemia, type 2 diabetes, alcohol use disorder, and substance abuse with benzodiazepines and prior cocaine who presented to the emergency department with worsening shortness of breath that began over the past 3-4 days.  It has been associated with a productive cough with greenish-yellowish sputum and he also notes some centralized chest tightness especially with movement and coughing.  He denies any radiation of pain or any particular exacerbating or alleviating factors.  He has been trying to take his home inhalers with no relief noted.  He reports that he has been noncompliant with his home medications over the last 3 weeks simply because he did not want to take all of his medications any longer.  EMS had given him IM Solu-Medrol 125 mg prior to arrival and he has received 2 DuoNeb treatments.  He continued to struggle with his breathing requiring BiPAP mask.   ED Course: Stable vital signs noted except for respiratory rate that was slightly elevated.  Some mild leukocytosis of 11,000 noted and glucose of 247.  EKG with sinus rhythm at 90 bpm and Covid testing negative.  1 view chest x-ray with no acute findings and troponin is 4.  BNP is 54.  Patient appears comfortable on BiPAP.  He is complaining of a headache at this time.   Assessment & Plan:   Active Problems:   Acute exacerbation of chronic obstructive pulmonary disease (COPD) (HCC)   Acute hypoxemic respiratory failure secondary to acute COPD exacerbation-improving -continue weaning O2 off. -Continue pulmicort. -DuoNebs every 6 hours -Chest x-ray without any signs of infection, but patient is noted to have some bronchitis/bronchiectasis, continue doxycycline. -Urine strep  pneumonia and Legionella evaluation still pending -Covid test negative -IV methylprednisolone 40 mg IV twice daily, slow taper. -Incentive spirometry and flutter valve and increase activity  Medication noncompliance -Patient has discontinued medications for the last 3 weeks due to fatigue -Encourage compliance and the need to call physician prior to any adjustments to medications  Headache-likely tension-improving -Continue Fioricet as needed -no HA's currently  History of CAD/CVA with recent cardiac arrest -Continue Plavix, statin, and metoprolol -no CP -continue risk factors modifications. -Shortness of breath associated with COPD exacerbation.  History of type 2 diabetes with steroid-induced hyperglycemia -Hold Metformin while inpatient and maintain on SSI with aggressive dosing every 4 hours -Lantus dose adjusted to 18 units BID and continue NovoLog 10 units 3 times daily with meals. -Hemoglobin A1c 9.4% increased from 7.3% previously, due to dietary and medication noncompliance -Carb modified diet encouraged.  History of alcohol use disorder as well as polysubstance abuse -Maintain on CIWA protocol while hospitalized -Alcohol level WNL and no signs of withdrawal currently noted -UDS positive for narcotics, barbiturates, and cocaine.  Patient claims not to be using any illicit substances. -reports been interested in lifestyle changes and looking to quit.   DVT prophylaxis: Lovenox Code Status: Full Family Communication: None at bedside Disposition Plan: Continue treatment for COPD exacerbation and wean oxygen as tolerated. Started on pulmicort and flutter valve. Increased lantus dose for better CBG's control.  Status is: Inpatient   Dispo: The patient is from: Home              Anticipated d/c is to: Home  Anticipated d/c date is: 1-2 days.              Patient currently is not medically stable to d/c.  He continues to have ongoing oxygen need  (intermittently), still having difficulty to speak in full sentences, uncontrolled CBG's and diffuse wheezing.  Consultants:   None  Procedures:   See below for x-ray reports  Antimicrobials:  Anti-infectives (From admission, onward)   Start     Dose/Rate Route Frequency Ordered Stop   07/18/19 2200  doxycycline (VIBRA-TABS) tablet 100 mg     Discontinue     100 mg Oral Every 12 hours 07/18/19 1602        Subjective: No fever, no chest pain.  Reports improvement in coughing spells.  Still having difficulty speaking in full sentences or shortness of breath on exertion.  No BiPAP required overnight and no oxygen supplementation appreciated during examination.  High blood sugar appreciated overnight as well (up to 421).  Objective: Vitals:   07/21/19 0100 07/21/19 0447 07/21/19 0734 07/21/19 1024  BP:  (!) 161/99  (!) 151/91  Pulse:  67  66  Resp:  20  20  Temp:  98.3 F (36.8 C)  97.9 F (36.6 C)  TempSrc:  Oral  Oral  SpO2: 97% 97% 94% 100%  Weight:      Height:        Intake/Output Summary (Last 24 hours) at 07/21/2019 1418 Last data filed at 07/21/2019 1338 Gross per 24 hour  Intake 480 ml  Output 3200 ml  Net -2720 ml   Filed Weights   07/18/19 1135 07/18/19 2147 07/19/19 0500  Weight: 90.7 kg 101.1 kg 101.1 kg    Examination: General exam: Alert, awake, oriented x 3; reports some improvement in his coughing spells with antitussive medications.  Still short of breath with activity and with diffuse wheezing reported.  Mild difficulty speaking in full sentences.  No nausea, no vomiting, no chest pain.  No BiPAP required.  Currently on room air. Respiratory system: Positive rhonchi bilaterally, expiratory wheezing appreciated diffusely.  No using accessory muscles. Cardiovascular system:RRR. No murmurs, rubs, gallops. Gastrointestinal system: Abdomen is nondistended, soft and nontender. No organomegaly or masses felt. Normal bowel sounds heard. Central nervous  system: Alert and oriented. No focal neurological deficits. Extremities: No cyanosis or clubbing. Skin: No rashes, no petechiae. Psychiatry: Judgement and insight appear normal. Mood & affect appropriate.    Data Reviewed: I have personally reviewed following labs and imaging studies  CBC: Recent Labs  Lab 07/18/19 1140 07/19/19 0406 07/20/19 0458  WBC 11.4* 15.0* 18.3*  NEUTROABS 5.8  --   --   HGB 13.9 13.0 12.9*  HCT 42.5 40.8 40.2  MCV 88.4 88.5 90.5  PLT 380 363 348   Basic Metabolic Panel: Recent Labs  Lab 07/18/19 1140 07/19/19 0406 07/20/19 0458 07/20/19 2107  NA 137 136 136  --   K 4.2 4.3 4.2  --   CL 101 102 99  --   CO2 25 22 27   --   GLUCOSE 247* 251* 211* 421*  BUN 11 17 22*  --   CREATININE 0.63 0.60* 0.69  --   CALCIUM 8.9 8.9 8.7*  --   MG  --  2.1  --   --    GFR: Estimated Creatinine Clearance: 125.2 mL/min (by C-G formula based on SCr of 0.69 mg/dL).   Liver Function Tests: Recent Labs  Lab 07/18/19 1140  AST 21  ALT 27  ALKPHOS  72  BILITOT 0.3  PROT 7.4  ALBUMIN 4.1    CBG: Recent Labs  Lab 07/20/19 2029 07/21/19 0002 07/21/19 0436 07/21/19 0742 07/21/19 1111  GLUCAP 419* 371* 251* 293* 374*    Recent Results (from the past 240 hour(s))  SARS Coronavirus 2 by RT PCR (hospital order, performed in [facility] hospital lab) Nasopharyngeal Nasopharyngeal Swab     Status: None   Collection Time: 07/18/19 11:56 AM   Specimen: Nasopharyngeal Swab  Result Value Ref Range Status   SARS Coronavirus 2 NEGATIVE NEGATIVE Final    Comment: (NOTE) SARS-CoV-2 target nucleic acids are NOT DETECTED.  The SARS-CoV-2 RNA is generally detectable in upper and lower respiratory specimens during the acute phase of infection. The lowest concentration of SARS-CoV-2 viral copies this assay can detect is 250 copies / mL. A negative result does not preclude SARS-CoV-2 infection and should not be used as the sole basis for treatment or  other patient management decisions.  A negative result may occur with improper specimen collection / handling, submission of specimen other than nasopharyngeal swab, presence of viral mutation(s) within the areas targeted by this assay, and inadequate number of viral copies (<250 copies / mL). A negative result must be combined with clinical observations, patient history, and epidemiological information.  Fact Sheet for Patients:   BoilerBrush.com.cy  Fact Sheet for Healthcare Providers: https://pope.com/  This test is not yet approved or  cleared by the [place] FDA and has been authorized for detection and/or diagnosis of SARS-CoV-2 by FDA under an Emergency Use Authorization (EUA).  This EUA will remain in effect (meaning this test can be used) for the duration of the COVID-19 declaration under Section 564(b)(1) of the Act, 21 U.S.C. section 360bbb-3(b)(1), unless the authorization is terminated or revoked sooner.  Performed at [facility], [address], [place], [place] [address]   MRSA PCR Screening     Status: None   Collection Time: 07/18/19  9:38 PM   Specimen: Nasal Mucosa; Nasopharyngeal  Result Value Ref Range Status   MRSA by PCR NEGATIVE NEGATIVE Final    Comment:        The GeneXpert MRSA Assay (FDA approved for NASAL specimens only), is one component of a comprehensive MRSA colonization surveillance program. It is not intended to diagnose MRSA infection nor to guide or monitor treatment for MRSA infections. Performed at [facility], [address], [place], [place] [address]      Radiology Studies: No results found.   Scheduled Meds: . atorvastatin  40 mg Oral q1800  . budesonide (PULMICORT) nebulizer solution  0.5 mg Nebulization BID  . Chlorhexidine Gluconate Cloth  6 each Topical Daily  . clopidogrel  75 mg Oral Daily  . doxycycline  100 mg Oral Q12H  . enoxaparin (LOVENOX) injection  40 mg  Subcutaneous Q24H  . folic acid  1 mg Oral Daily  . insulin aspart  0-20 Units Subcutaneous Q4H  . insulin aspart  10 Units Subcutaneous TID WC  . insulin glargine  18 Units Subcutaneous BID  . ipratropium-albuterol  3 mL Nebulization Q6H  . losartan  25 mg Oral Daily  . mouth rinse  15 mL Mouth Rinse BID  . methylPREDNISolone (SOLU-MEDROL) injection  40 mg Intravenous Q12H  . metoprolol succinate  25 mg Oral Daily  . multivitamin with minerals  1 tablet Oral Daily  . sodium chloride flush  3 mL Intravenous Q12H  . thiamine  100 mg Oral Daily   Or  .  thiamine  100 mg Intravenous Daily   Continuous Infusions: . sodium chloride       LOS: 3 days    Time spent: 30 minutes    [name], MD [facility]  If 7PM-7AM, please contact night-coverage [email or web address] 07/21/2019, 2:18 PM

## 2019-07-22 DIAGNOSIS — I10 Essential (primary) hypertension: Secondary | ICD-10-CM

## 2019-07-22 DIAGNOSIS — I169 Hypertensive crisis, unspecified: Secondary | ICD-10-CM

## 2019-07-22 DIAGNOSIS — Z6832 Body mass index (BMI) 32.0-32.9, adult: Secondary | ICD-10-CM

## 2019-07-22 DIAGNOSIS — F419 Anxiety disorder, unspecified: Secondary | ICD-10-CM

## 2019-07-22 DIAGNOSIS — F1910 Other psychoactive substance abuse, uncomplicated: Secondary | ICD-10-CM

## 2019-07-22 DIAGNOSIS — J9601 Acute respiratory failure with hypoxia: Secondary | ICD-10-CM

## 2019-07-22 DIAGNOSIS — E6609 Other obesity due to excess calories: Secondary | ICD-10-CM

## 2019-07-22 LAB — GLUCOSE, CAPILLARY
Glucose-Capillary: 220 mg/dL — ABNORMAL HIGH (ref 70–99)
Glucose-Capillary: 215 mg/dL — ABNORMAL HIGH (ref 70–99)
Glucose-Capillary: 289 mg/dL — ABNORMAL HIGH (ref 70–99)

## 2019-07-22 LAB — LEGIONELLA PNEUMOPHILA SEROGP 1 UR AG: L. pneumophila Serogp 1 Ur Ag: NEGATIVE

## 2019-07-22 MED ORDER — UMECLIDINIUM-VILANTEROL 62.5-25 MCG/INH IN AEPB: 1.0000 | INHALATION_SPRAY | Freq: Every day | RESPIRATORY_TRACT | 2 refills | Status: DC

## 2019-07-22 MED ORDER — DOXYCYCLINE HYCLATE 100 MG PO TABS: 100.0000 mg | ORAL_TABLET | Freq: Two times a day (BID) | ORAL | 0 refills | Status: AC

## 2019-07-22 MED ORDER — FOLIC ACID 1 MG PO TABS: 1.0000 mg | ORAL_TABLET | Freq: Every day | ORAL | 2 refills | Status: AC

## 2019-07-22 MED ORDER — HYDROCODONE-HOMATROPINE 5-1.5 MG/5ML PO SYRP: 5.0000 mL | ORAL_SOLUTION | Freq: Three times a day (TID) | ORAL | 0 refills | Status: DC | PRN

## 2019-07-22 MED ORDER — METOPROLOL SUCCINATE ER 25 MG PO TB24: 25.0000 mg | ORAL_TABLET | Freq: Every day | ORAL | 2 refills | Status: DC

## 2019-07-22 MED ORDER — METOPROLOL SUCCINATE ER 25 MG PO TB24: 25.0000 mg | ORAL_TABLET | Freq: Every day | ORAL | 2 refills | Status: AC

## 2019-07-22 MED ORDER — ALPRAZOLAM 0.5 MG PO TABS: 0.5000 mg | ORAL_TABLET | Freq: Two times a day (BID) | ORAL | 0 refills | Status: DC | PRN

## 2019-07-22 MED ORDER — IPRATROPIUM-ALBUTEROL 20-100 MCG/ACT IN AERS: 1.0000 | INHALATION_SPRAY | Freq: Four times a day (QID) | RESPIRATORY_TRACT | 3 refills | Status: DC | PRN

## 2019-07-22 MED ORDER — ADULT MULTIVITAMIN W/MINERALS CH: 1.0000 | ORAL_TABLET | Freq: Every day | ORAL | 2 refills | Status: DC

## 2019-07-22 MED ORDER — CLOPIDOGREL BISULFATE 75 MG PO TABS: 75.0000 mg | ORAL_TABLET | Freq: Every day | ORAL | 2 refills | Status: DC

## 2019-07-22 MED ORDER — METFORMIN HCL 1000 MG PO TABS: 1000.0000 mg | ORAL_TABLET | Freq: Two times a day (BID) | ORAL | 2 refills | Status: DC

## 2019-07-22 MED ORDER — DOXYCYCLINE HYCLATE 100 MG PO TABS: 100.0000 mg | ORAL_TABLET | Freq: Two times a day (BID) | ORAL | 0 refills | Status: DC

## 2019-07-22 MED ORDER — PREDNISONE 20 MG PO TABS: 20.0000 mg | ORAL_TABLET | Freq: Every day | ORAL | 0 refills | Status: DC

## 2019-07-22 MED ORDER — BUTALBITAL-APAP-CAFFEINE 50-325-40 MG PO TABS: 1.0000 | ORAL_TABLET | Freq: Four times a day (QID) | ORAL | 0 refills | Status: DC | PRN

## 2019-07-22 MED ORDER — ATORVASTATIN CALCIUM 40 MG PO TABS: 40.0000 mg | ORAL_TABLET | Freq: Every day | ORAL | 1 refills | Status: DC

## 2019-07-22 MED ORDER — LOSARTAN POTASSIUM 25 MG PO TABS
25.0000 mg | ORAL_TABLET | Freq: Every day | ORAL | 2 refills | Status: DC
Start: 2019-07-22 — End: 2019-08-04

## 2019-07-22 MED ORDER — THIAMINE HCL 100 MG PO TABS: 250.0000 mg | ORAL_TABLET | Freq: Every day | ORAL | 2 refills | Status: DC

## 2019-07-22 NOTE — Progress Notes (Signed)
Inpatient Diabetes Program Recommendations  AACE/ADA: New Consensus Statement on Inpatient Glycemic Control (2015)  Target Ranges:  Prepandial:   less than 140 mg/dL      Peak postprandial:   less than 180 mg/dL (1-2 hours)      Critically ill patients:  140 - 180 mg/dL   Results for CROSS, JORGE (MRN [id number]) as of 07/22/2019 12:29  Ref. Range 07/22/2019 07:41 07/22/2019 11:54  Glucose-Capillary Latest Ref Range: 70 - 99 mg/dL 761 (H)  17 units NOVOLOG +  18 units LANTUS  289 (H)    Home DM Meds: Metformin 500 mg BID  Current Orders: Lantus 18 units BID                            Novolog Resistant Correction Scale/ SSI (0-20 units) Q4 hours                            Novolog 10 units TID with meals     MD- Note patient getting Solumedrol 40 mg BID.  Note that Lantus and Novolog Meal Coverage both increased.  CBGs remain >200 today.  If patient to remain on Solumedrol today, please consider:   1. Increase Lantus slightly to 20 units BID  2. Increase Novolog Meal Coverage to: Novolog 13 units TID     --Will follow patient during hospitalization--  Ambrose Finland RN, MSN, CDE Diabetes Coordinator Inpatient Glycemic Control Team Team Pager: [phone] (8a-5p)

## 2019-07-22 NOTE — TOC Transition Note (Signed)
Transition of Care [facility]) - CM/SW Discharge Note   Patient Details  Name: Robert Rich MRN: [id number] Date of Birth: Apr 05, 1965  Transition of Care [facility]) CM/SW Contact:  Leitha Bleak, RN Phone Number: 07/22/2019, 3:38 PM   Clinical Narrative:   Patient admitted with acute exacerbation of COPD. TOC consulted to medication assistance.  Patient states he is on 9 medications and can not afford them. He will get insurance coverage next month. Patient enrolled in assistance program,  MATCH  Voucher provided and list of pharmacy.    Barriers to Discharge: Barriers Resolved   Patient Goals and CMS Choice Patient states their goals for this hospitalization and ongoing recovery are:: to go home. CMS Medicare.gov Compare Post Acute Care list provided to:: Patient Choice offered to / list presented to : Patient

## 2019-07-22 NOTE — Discharge Summary (Signed)
Physician Discharge Summary  KANIEL KIANG ZOX:[id number] DOB: [birth year] DOA: 07/18/2019  PCP: Bridget Hartshorn, NP  Admit date: 07/18/2019 Discharge date: 07/22/2019  Time spent: 35 minutes  Recommendations for Outpatient Follow-up:  1. Repeat basic metabolic panel to evaluate lites and renal function 2. Reassess blood pressure and adjust antihypertensive regimen as needed 3. Continue assist patient with tobacco cessation counseling and keeping cell free from any recreational drugs. 4. Close monitoring to patient CBGs and A1c with further adjustment to hypoglycemic regimen as required.   Discharge Diagnoses:  Active Problems:   Acute exacerbation of chronic obstructive pulmonary disease (COPD) (HCC)   Acute respiratory failure with hypoxia (HCC)   Class 1 obesity due to excess calories with serious comorbidity and body mass index (BMI) of 32.0 to 32.9 in adult   Essential hypertension   Anxiety   Substance abuse ([place])   Discharge Condition: Stable and improved.  Discharged home with instruction to follow-up with PCP in 10 days and with pulmonology service in 3 weeks.  CODE STATUS: Full code.  Diet recommendation: Low calorie diet, modified carbohydrates and heart healthy.  Filed Weights   07/18/19 1135 07/18/19 2147 07/19/19 0500  Weight: 90.7 kg 101.1 kg 101.1 kg    History of present illness:  Per HPI: Robert Rich a 54 y.o.malewith medical history significant forCOPD, CAD, CVA, history of prior COVID-19 11/2018, dyslipidemia, type 2 diabetes, alcohol use disorder, and substance abuse with benzodiazepines and prior cocaine who presented to the emergency department with worsening shortness of breath that began over the past 3-4 days.It has been associated with a productive cough with greenish-yellowish sputum and he also notes some centralized chest tightness especially with movement and coughing. He denies any radiation of pain or any particular exacerbating or  alleviating factors. He has been trying to take his home inhalers with no relief noted. He reports that he has been noncompliant with his home medications over the last 3 weeks simply because he did not want to take all of his medications any longer. EMS had given him IM Solu-Medrol 125 mg prior to arrival and he has received 2 DuoNeb treatments. He continued to struggle with his breathing requiring BiPAP mask.  ED Course:Stable vital signs noted except for respiratory rate that was slightly elevated. Some mild leukocytosis of 11,000 noted and glucose of 247. EKG with sinus rhythm at 90 bpm and Covid testing negative. 1 view chest x-ray with no acute findings and troponin is 4. BNP is 54. Patient appears comfortable on BiPAP. He is complaining of a headache at this time.   Hospital Course:  Acute hypoxemic respiratory failure secondary to acute COPD exacerbation -Improve, able to speak in full sentences and no requiring oxygen supplementation. -Urine strep pneumonia and Legionella antigen negative. -Covid test negative -Discharged on a slow steroids tapering, resumption of as needed bronchodilator and Anoro Ellipta and Atrovent. -Complete oral antibiotic therapy and follow-up as an outpatient with pulmonologist for PFTs and further adjustment to maintenance management. -Incentive spirometry and flutter valve and increase paced activity recommended. -As needed Hycodan for excessive refractory dry coughing spells provided at discharge.  Medication noncompliance -Patient has discontinued medications for the last 3 weeks due to fatigue -Encourage compliance and the need to call physician prior to any personal adjustments to medications be done.  Class I obesity -Body mass index is 32.91 kg/m. -Calorie diet, portion control and increase physical activity discussed with patient. -We will benefit of sleep study as an outpatient.  Headache-likely  tension-improving -Continue Fioricet  as needed -no HA's currently  History of CAD/CVA with recent cardiac arrest -Continue Plavix, statin, and metoprolol -no CP -continue risk factors modifications. -Shortness of breath associated with COPD exacerbation. -Continue outpatient follow-up with cardiology service.  History of type 2 diabetes with steroid-induced hyperglycemia -Aggressive management with Lantus and sliding scale insulin provided while receiving steroid therapy. -At discharge CBGs in the low 200s range -Patient encouraged to follow modified carbohydrate diet and his Metformin has been adjusted to 1000 mg twice a day even A1c of 9.4. -Close CBG monitoring recommended, repeat of his hemoglobin A1c and if needed at the junction of [place] for further diabetes management encouraged.  Leukocytosis -In the setting of his steroids usage for COPD -Currently afebrile and without signs of acute infection -He will complete oral antibiotic therapy for bronchiectasis component. -Repeat CBC at follow-up visit to reassess WBCs trend.  History of alcohol use disorder as well as polysubstance abuse -Patient was maintained on CIWA protocol; no withdrawal symptoms appreciated. -Alcohol level WNL at time of admission -Cessation counseling has been provided. -Patient's UDS was positive for narcotics, barbiturates, and cocaine.  Patient claims not to be using any illicit substances. -reports been interested in lifestyle changes and looking to quit. -Outpatient resources provided by [facility] service to help with quitting process. -Patient also encouraged to quit smoking.  Depression/anxiety -No suicidal ideation or hallucination -Low amount of anxiolytic medications have been provided at time of discharge.  Procedures: See below for x-ray reports.  Consultations:  None  Discharge Exam: Vitals:   07/22/19 1338 07/22/19 1515  BP: (!) 152/84   Pulse: 69   Resp: 19   Temp: 98.6 F (37 C)   SpO2: 98% 96%    General:  Able to speak in full sentences, no chest pain, no nausea, no vomiting.  Reports no chest pain, still slightly short of breath with activity but significant improvement is stable to go home. Cardiovascular: S1-S2, no rubs, no gallops.  Unable to properly assess for JVD with body habitus but no frank normalities appreciated. Respiratory: Scattered rhonchi bilaterally; very little expiratory wheezing at end of expiration; no using accessory muscles, normal respiratory effort. Abdomen: Obese, soft, nontender, positive bowel sounds Extremities: No cyanosis, no clubbing.  Discharge Instructions   Discharge Instructions    Diet - low sodium heart healthy   Complete by: As directed    Discharge instructions   Complete by: As directed    Stop smoking Stop recreational drugs Maintain adequate hydration Follow heart healthy/modified carbohydrate diet and low calorie diet. -Outpatient follow-up with pulmonology service Follow-up with PCP in 10 days Take medications as prescribed     Allergies as of 07/22/2019      Reactions   Asa [aspirin] Hives   Zolmitriptan Nausea And Vomiting, Other (See Comments)   Severe headaches      Medication List    STOP taking these medications   albuterol 108 (90 Base) MCG/ACT inhaler Commonly known as: VENTOLIN HFA   HYDROcodone-acetaminophen 5-325 MG tablet Commonly known as: NORCO/VICODIN   naproxen 500 MG tablet Commonly known as: NAPROSYN   sildenafil 20 MG tablet Commonly known as: REVATIO   thiamine 100 MG tablet   traMADol 50 MG tablet Commonly known as: ULTRAM     TAKE these medications   ALPRAZolam 0.5 MG tablet Commonly known as: XANAX Take 1 tablet (0.5 mg total) by mouth every 12 (twelve) hours as needed for anxiety. What changed: when to take this  atorvastatin 40 MG tablet Commonly known as: LIPITOR Take 1 tablet (40 mg total) by mouth daily at 6 PM.   butalbital-acetaminophen-caffeine 50-325-40 MG tablet Commonly known  as: FIORICET Take 1 tablet by mouth every 6 (six) hours as needed for migraine.   clopidogrel 75 MG tablet Commonly known as: PLAVIX Take 1 tablet (75 mg total) by mouth daily.   doxycycline 100 MG tablet Commonly known as: VIBRA-TABS Take 1 tablet (100 mg total) by mouth every 12 (twelve) hours for 3 days.   folic acid 1 MG tablet Commonly known as: FOLVITE Take 1 tablet (1 mg total) by mouth daily.   HYDROcodone-homatropine 5-1.5 MG/5ML syrup Commonly known as: HYCODAN Take 5 mLs by mouth every 8 (eight) hours as needed (refractory coughing spells).   Ipratropium-Albuterol 20-100 MCG/ACT Aers respimat Commonly known as: COMBIVENT Inhale 1 puff into the lungs every 6 (six) hours as needed for wheezing or shortness of breath. What changed:   when to take this  reasons to take this  Another medication with the same name was removed. Continue taking this medication, and follow the directions you see here.   losartan 25 MG tablet Commonly known as: COZAAR Take 1 tablet (25 mg total) by mouth daily.   metFORMIN 1000 MG tablet Commonly known as: Glucophage Take 1 tablet (1,000 mg total) by mouth 2 (two) times daily with a meal. What changed:   medication strength  how much to take   metoprolol succinate 25 MG 24 hr tablet Commonly known as: TOPROL-XL Take 1 tablet (25 mg total) by mouth daily.   multivitamin with minerals Tabs tablet Take 1 tablet by mouth daily. Start taking on: July 23, 2019   predniSONE 20 MG tablet Commonly known as: Deltasone Take 1 tablet (20 mg total) by mouth daily. Take 3 tablets by mouth daily x1 day; then 2 tablets by mouth daily x2 days; then 1 tablet by mouth daily x3 days; then half tablet by mouth daily x3 days and stop prednisone.   umeclidinium-vilanterol 62.5-25 MCG/INH Aepb Commonly known as: ANORO ELLIPTA Inhale 1 puff into the lungs daily.      Allergies  Allergen Reactions  . Asa [Aspirin] Hives  . Zolmitriptan Nausea  And Vomiting and Other (See Comments)    Severe headaches    Follow-up Information    Hemberg, Ruby Cola, NP. Schedule an appointment as soon as possible for a visit in 10 day(s).   Specialty: Adult Health Nurse Practitioner Contact information: [address] [place] [address] [phone]        [name], MD. Schedule an appointment as soon as possible for a visit in 3 week(s).   Specialty: Pulmonary Disease Why: visit to be made in [place] office. Contact information: [address] [place] [address] [phone]                The results of significant diagnostics from this hospitalization (including imaging, microbiology, ancillary and laboratory) are listed below for reference.    Significant Diagnostic Studies: DG Chest Portable 1 View  Result Date: 07/18/2019 CLINICAL DATA:  Cough and shortness of breath 3 days. Chest pain. COPD. EXAM: PORTABLE CHEST 1 VIEW COMPARISON:  04/08/2018 FINDINGS: The heart size and mediastinal contours are within normal limits. Both lungs are clear. The visualized skeletal structures are unremarkable. IMPRESSION: No active disease. Electronically Signed   By: Danae Orleans M.D.   On: 07/18/2019 11:55    Microbiology: Recent Results (from the past  240 hour(s))  SARS Coronavirus 2 by RT PCR (hospital order, performed in [facility] hospital lab) Nasopharyngeal Nasopharyngeal Swab     Status: None   Collection Time: 07/18/19 11:56 AM   Specimen: Nasopharyngeal Swab  Result Value Ref Range Status   SARS Coronavirus 2 NEGATIVE NEGATIVE Final    Comment: (NOTE) SARS-CoV-2 target nucleic acids are NOT DETECTED.  The SARS-CoV-2 RNA is generally detectable in upper and lower respiratory specimens during the acute phase of infection. The lowest concentration of SARS-CoV-2 viral copies this assay can detect is 250 copies / mL. A negative result does not preclude SARS-CoV-2 infection and should not be used as  the sole basis for treatment or other patient management decisions.  A negative result may occur with improper specimen collection / handling, submission of specimen other than nasopharyngeal swab, presence of viral mutation(s) within the areas targeted by this assay, and inadequate number of viral copies (<250 copies / mL). A negative result must be combined with clinical observations, patient history, and epidemiological information.  Fact Sheet for Patients:   BoilerBrush.com.cy  Fact Sheet for Healthcare Providers: https://pope.com/  This test is not yet approved or  cleared by the [place] FDA and has been authorized for detection and/or diagnosis of SARS-CoV-2 by FDA under an Emergency Use Authorization (EUA).  This EUA will remain in effect (meaning this test can be used) for the duration of the COVID-19 declaration under Section 564(b)(1) of the Act, 21 U.S.C. section 360bbb-3(b)(1), unless the authorization is terminated or revoked sooner.  Performed at [facility], [address] [place], [place] [address]   MRSA PCR Screening     Status: None   Collection Time: 07/18/19  9:38 PM   Specimen: Nasal Mucosa; Nasopharyngeal  Result Value Ref Range Status   MRSA by PCR NEGATIVE NEGATIVE Final    Comment:        The GeneXpert MRSA Assay (FDA approved for NASAL specimens only), is one component of a comprehensive MRSA colonization surveillance program. It is not intended to diagnose MRSA infection nor to guide or monitor treatment for MRSA infections. Performed at [facility], [address], [place], [place] [address]      Labs: Basic Metabolic Panel: Recent Labs  Lab 07/18/19 1140 07/19/19 0406 07/20/19 0458 07/20/19 2107  NA 137 136 136  --   K 4.2 4.3 4.2  --   CL 101 102 99  --   CO2 25 22 27   --   GLUCOSE 247* 251* 211* 421*  BUN 11 17 22*  --   CREATININE 0.63 0.60* 0.69  --   CALCIUM 8.9 8.9 8.7*   --   MG  --  2.1  --   --    Liver Function Tests: Recent Labs  Lab 07/18/19 1140  AST 21  ALT 27  ALKPHOS 72  BILITOT 0.3  PROT 7.4  ALBUMIN 4.1   CBC: Recent Labs  Lab 07/18/19 1140 07/19/19 0406 07/20/19 0458  WBC 11.4* 15.0* 18.3*  NEUTROABS 5.8  --   --   HGB 13.9 13.0 12.9*  HCT 42.5 40.8 40.2  MCV 88.4 88.5 90.5  PLT 380 363 348   BNP (last 3 results) Recent Labs    03/18/19 0006 03/28/19 1126 07/18/19 1140  BNP 107.3* 70.0 54.0    CBG: Recent Labs  Lab 07/21/19 2019 07/21/19 2357 07/22/19 0404 07/22/19 0741 07/22/19 1154  GLUCAP 357* 247* 220* 215* 289*    Signed:  07/24/19  MD.  [facility] 07/22/2019, 4:58 PM

## 2019-07-22 NOTE — Plan of Care (Signed)

## 2019-07-22 NOTE — Plan of Care (Signed)

## 2019-08-04 ENCOUNTER — Encounter (HOSPITAL_COMMUNITY): Payer: Self-pay | Admitting: *Deleted

## 2019-08-04 ENCOUNTER — Inpatient Hospital Stay (HOSPITAL_COMMUNITY)
Admission: EM | Admit: 2019-08-04 | Discharge: 2019-08-11 | DRG: 190 | Disposition: A | Discharge status: Home / Self Care | Payer: Medicaid Other | Attending: Internal Medicine | Admitting: Internal Medicine

## 2019-08-04 ENCOUNTER — Other Ambulatory Visit: Payer: Self-pay

## 2019-08-04 ENCOUNTER — Emergency Department (HOSPITAL_COMMUNITY): Payer: Medicaid Other

## 2019-08-04 DIAGNOSIS — F1410 Cocaine abuse, uncomplicated: Secondary | ICD-10-CM | POA: Diagnosis present

## 2019-08-04 DIAGNOSIS — J9621 Acute and chronic respiratory failure with hypoxia: Secondary | ICD-10-CM | POA: Diagnosis present

## 2019-08-04 DIAGNOSIS — D72829 Elevated white blood cell count, unspecified: Secondary | ICD-10-CM | POA: Diagnosis present

## 2019-08-04 DIAGNOSIS — Z8616 Personal history of COVID-19: Secondary | ICD-10-CM | POA: Diagnosis not present

## 2019-08-04 DIAGNOSIS — I2510 Atherosclerotic heart disease of native coronary artery without angina pectoris: Secondary | ICD-10-CM | POA: Diagnosis present

## 2019-08-04 DIAGNOSIS — Z6832 Body mass index (BMI) 32.0-32.9, adult: Secondary | ICD-10-CM | POA: Diagnosis not present

## 2019-08-04 DIAGNOSIS — R0603 Acute respiratory distress: Secondary | ICD-10-CM

## 2019-08-04 DIAGNOSIS — Z888 Allergy status to other drugs, medicaments and biological substances status: Secondary | ICD-10-CM

## 2019-08-04 DIAGNOSIS — R739 Hyperglycemia, unspecified: Secondary | ICD-10-CM | POA: Diagnosis present

## 2019-08-04 DIAGNOSIS — F1011 Alcohol abuse, in remission: Secondary | ICD-10-CM | POA: Diagnosis present

## 2019-08-04 DIAGNOSIS — E669 Obesity, unspecified: Secondary | ICD-10-CM | POA: Diagnosis present

## 2019-08-04 DIAGNOSIS — Z720 Tobacco use: Secondary | ICD-10-CM | POA: Diagnosis present

## 2019-08-04 DIAGNOSIS — F411 Generalized anxiety disorder: Secondary | ICD-10-CM | POA: Diagnosis present

## 2019-08-04 DIAGNOSIS — Z87891 Personal history of nicotine dependence: Secondary | ICD-10-CM | POA: Diagnosis not present

## 2019-08-04 DIAGNOSIS — Z8673 Personal history of transient ischemic attack (TIA), and cerebral infarction without residual deficits: Secondary | ICD-10-CM

## 2019-08-04 DIAGNOSIS — E785 Hyperlipidemia, unspecified: Secondary | ICD-10-CM | POA: Diagnosis present

## 2019-08-04 DIAGNOSIS — Z841 Family history of disorders of kidney and ureter: Secondary | ICD-10-CM

## 2019-08-04 DIAGNOSIS — J441 Chronic obstructive pulmonary disease with (acute) exacerbation: Secondary | ICD-10-CM | POA: Diagnosis not present

## 2019-08-04 DIAGNOSIS — I169 Hypertensive crisis, unspecified: Secondary | ICD-10-CM

## 2019-08-04 DIAGNOSIS — Z7902 Long term (current) use of antithrombotics/antiplatelets: Secondary | ICD-10-CM | POA: Diagnosis not present

## 2019-08-04 DIAGNOSIS — Z20822 Contact with and (suspected) exposure to covid-19: Secondary | ICD-10-CM | POA: Diagnosis present

## 2019-08-04 DIAGNOSIS — Z79899 Other long term (current) drug therapy: Secondary | ICD-10-CM

## 2019-08-04 DIAGNOSIS — Z7984 Long term (current) use of oral hypoglycemic drugs: Secondary | ICD-10-CM

## 2019-08-04 DIAGNOSIS — Z8249 Family history of ischemic heart disease and other diseases of the circulatory system: Secondary | ICD-10-CM

## 2019-08-04 DIAGNOSIS — T380X5A Adverse effect of glucocorticoids and synthetic analogues, initial encounter: Secondary | ICD-10-CM | POA: Diagnosis present

## 2019-08-04 DIAGNOSIS — F1910 Other psychoactive substance abuse, uncomplicated: Secondary | ICD-10-CM | POA: Diagnosis present

## 2019-08-04 DIAGNOSIS — I10 Essential (primary) hypertension: Secondary | ICD-10-CM | POA: Diagnosis present

## 2019-08-04 DIAGNOSIS — E1165 Type 2 diabetes mellitus with hyperglycemia: Secondary | ICD-10-CM | POA: Diagnosis present

## 2019-08-04 DIAGNOSIS — F1310 Sedative, hypnotic or anxiolytic abuse, uncomplicated: Secondary | ICD-10-CM | POA: Diagnosis present

## 2019-08-04 DIAGNOSIS — F419 Anxiety disorder, unspecified: Secondary | ICD-10-CM | POA: Diagnosis present

## 2019-08-04 DIAGNOSIS — Z886 Allergy status to analgesic agent status: Secondary | ICD-10-CM

## 2019-08-04 DIAGNOSIS — Z8674 Personal history of sudden cardiac arrest: Secondary | ICD-10-CM | POA: Diagnosis not present

## 2019-08-04 DIAGNOSIS — E6609 Other obesity due to excess calories: Secondary | ICD-10-CM

## 2019-08-04 DIAGNOSIS — Z9981 Dependence on supplemental oxygen: Secondary | ICD-10-CM

## 2019-08-04 LAB — BASIC METABOLIC PANEL
Anion gap: 12 (ref 5–15)
BUN: 12 mg/dL (ref 6–20)
CO2: 24 mmol/L (ref 22–32)
Calcium: 8.6 mg/dL — ABNORMAL LOW (ref 8.9–10.3)
Chloride: 101 mmol/L (ref 98–111)
Creatinine, Ser: 0.59 mg/dL — ABNORMAL LOW (ref 0.61–1.24)
GFR calc Af Amer: >60 mL/min (ref >60)
GFR calc non Af Amer: >60 mL/min (ref >60)
Glucose, Bld: 229 mg/dL — ABNORMAL HIGH (ref 70–99)
Potassium: 4.1 mmol/L (ref 3.5–5.1)
Sodium: 137 mmol/L (ref 135–145)

## 2019-08-04 LAB — CBC
HCT: 40.8 % (ref 39.0–52.0)
Hemoglobin: 13.1 g/dL (ref 13.0–17.0)
MCH: 28.6 pg (ref 26.0–34.0)
MCHC: 32.1 g/dL (ref 30.0–36.0)
MCV: 89.1 fL (ref 80.0–100.0)
Platelets: 341 10*3/uL (ref 150–400)
RBC: 4.58 MIL/uL (ref 4.22–5.81)
RDW: 13.2 % (ref 11.5–15.5)
WBC: 12.6 10*3/uL — ABNORMAL HIGH (ref 4.0–10.5)
nRBC: 0 % (ref 0.0–0.2)

## 2019-08-04 LAB — GLUCOSE, CAPILLARY
Glucose-Capillary: 408 mg/dL — ABNORMAL HIGH (ref 70–99)
Glucose-Capillary: 374 mg/dL — ABNORMAL HIGH (ref 70–99)

## 2019-08-04 LAB — SARS CORONAVIRUS 2 BY RT PCR (HOSPITAL ORDER, PERFORMED IN ~~LOC~~ HOSPITAL LAB): SARS Coronavirus 2: NEGATIVE

## 2019-08-04 LAB — RAPID URINE DRUG SCREEN, HOSP PERFORMED
Amphetamines: NOT DETECTED
Barbiturates: POSITIVE — AB
Benzodiazepines: POSITIVE — AB
Cocaine: NOT DETECTED
Opiates: POSITIVE — AB
Tetrahydrocannabinol: NOT DETECTED

## 2019-08-04 LAB — MAGNESIUM: Magnesium: 2.3 mg/dL (ref 1.7–2.4)

## 2019-08-04 LAB — VITAMIN D 25 HYDROXY (VIT D DEFICIENCY, FRACTURES): Vit D, 25-Hydroxy: 22.95 ng/mL — ABNORMAL LOW (ref 30–100)

## 2019-08-04 LAB — ETHANOL: Alcohol, Ethyl (B): <10 mg/dL (ref <10)

## 2019-08-04 MED ORDER — INSULIN ASPART 100 UNIT/ML ~~LOC~~ SOLN: 6.0000 [IU] | Freq: Three times a day (TID) | SUBCUTANEOUS | Status: DC

## 2019-08-04 MED ORDER — NICOTINE 21 MG/24HR TD PT24
21.0000 mg | MEDICATED_PATCH | Freq: Every day | TRANSDERMAL | Status: DC | PRN
  Filled 2019-08-04: qty 1

## 2019-08-04 MED ORDER — HYDROCODONE-HOMATROPINE 5-1.5 MG/5ML PO SYRP
5.0000 mL | ORAL_SOLUTION | Freq: Three times a day (TID) | ORAL | Status: DC | PRN
  Filled 2019-08-04: qty 5

## 2019-08-04 MED ORDER — ACETAMINOPHEN 325 MG PO TABS
650.0000 mg | ORAL_TABLET | Freq: Four times a day (QID) | ORAL | Status: DC | PRN
  Administered 2019-08-04: 650 mg via ORAL
  Filled 2019-08-04 (×2): qty 2

## 2019-08-04 MED ORDER — ONDANSETRON HCL 4 MG/2ML IJ SOLN: 4.0000 mg | Freq: Four times a day (QID) | INTRAMUSCULAR | Status: DC | PRN

## 2019-08-04 MED ORDER — ALBUTEROL (5 MG/ML) CONTINUOUS INHALATION SOLN
20.0000 mg | INHALATION_SOLUTION | RESPIRATORY_TRACT | Status: AC
  Administered 2019-08-04: 20 mg via RESPIRATORY_TRACT
  Filled 2019-08-04: qty 20

## 2019-08-04 MED ORDER — INSULIN ASPART 100 UNIT/ML ~~LOC~~ SOLN
0.0000 [IU] | Freq: Every day | SUBCUTANEOUS | Status: DC
  Administered 2019-08-05: 3 [IU] via SUBCUTANEOUS
  Administered 2019-08-06: 5 [IU] via SUBCUTANEOUS
  Administered 2019-08-07 – 2019-08-09 (×2): 4 [IU] via SUBCUTANEOUS

## 2019-08-04 MED ORDER — INSULIN ASPART 100 UNIT/ML ~~LOC~~ SOLN
9.0000 [IU] | Freq: Once | SUBCUTANEOUS | Status: AC
  Administered 2019-08-04: 9 [IU] via SUBCUTANEOUS

## 2019-08-04 MED ORDER — BUTALBITAL-APAP-CAFFEINE 50-325-40 MG PO TABS: 1.0000 | ORAL_TABLET | Freq: Four times a day (QID) | ORAL | Status: DC | PRN

## 2019-08-04 MED ORDER — THIAMINE HCL 100 MG PO TABS
250.0000 mg | ORAL_TABLET | Freq: Every day | ORAL | Status: DC
  Administered 2019-08-05 – 2019-08-11 (×8): 250 mg via ORAL
  Filled 2019-08-04 (×7): qty 3

## 2019-08-04 MED ORDER — ONDANSETRON HCL 4 MG PO TABS: 4.0000 mg | ORAL_TABLET | Freq: Four times a day (QID) | ORAL | Status: DC | PRN

## 2019-08-04 MED ORDER — INSULIN ASPART 100 UNIT/ML ~~LOC~~ SOLN: 0.0000 [IU] | Freq: Three times a day (TID) | SUBCUTANEOUS | Status: DC

## 2019-08-04 MED ORDER — CLOPIDOGREL BISULFATE 75 MG PO TABS
75.0000 mg | ORAL_TABLET | Freq: Every day | ORAL | Status: DC
  Administered 2019-08-05 – 2019-08-11 (×7): 75 mg via ORAL
  Filled 2019-08-04 (×7): qty 1

## 2019-08-04 MED ORDER — FOLIC ACID 1 MG PO TABS
1.0000 mg | ORAL_TABLET | Freq: Every day | ORAL | Status: DC
  Administered 2019-08-05 – 2019-08-11 (×7): 1 mg via ORAL
  Filled 2019-08-04 (×7): qty 1

## 2019-08-04 MED ORDER — LOSARTAN POTASSIUM 50 MG PO TABS
25.0000 mg | ORAL_TABLET | Freq: Every day | ORAL | Status: DC
  Administered 2019-08-05 – 2019-08-11 (×7): 25 mg via ORAL
  Filled 2019-08-04 (×7): qty 1

## 2019-08-04 MED ORDER — METOPROLOL SUCCINATE ER 25 MG PO TB24
25.0000 mg | ORAL_TABLET | Freq: Every day | ORAL | Status: DC
  Administered 2019-08-05 – 2019-08-11 (×7): 25 mg via ORAL
  Filled 2019-08-04 (×7): qty 1

## 2019-08-04 MED ORDER — LEVOFLOXACIN 750 MG PO TABS
750.0000 mg | ORAL_TABLET | Freq: Every day | ORAL | Status: AC
  Administered 2019-08-04 – 2019-08-09 (×6): 750 mg via ORAL
  Filled 2019-08-04 (×6): qty 1

## 2019-08-04 MED ORDER — ENOXAPARIN SODIUM 40 MG/0.4ML ~~LOC~~ SOLN
40.0000 mg | SUBCUTANEOUS | Status: DC
  Administered 2019-08-04 – 2019-08-10 (×7): 40 mg via SUBCUTANEOUS
  Filled 2019-08-04 (×6): qty 0.4

## 2019-08-04 MED ORDER — ADULT MULTIVITAMIN W/MINERALS CH
1.0000 | ORAL_TABLET | Freq: Every day | ORAL | Status: DC
  Administered 2019-08-05 – 2019-08-11 (×7): 1 via ORAL
  Filled 2019-08-04 (×7): qty 1

## 2019-08-04 MED ORDER — ALPRAZOLAM 0.5 MG PO TABS
0.5000 mg | ORAL_TABLET | Freq: Two times a day (BID) | ORAL | Status: DC | PRN
  Administered 2019-08-04 – 2019-08-11 (×10): 0.5 mg via ORAL
  Filled 2019-08-04 (×10): qty 1

## 2019-08-04 MED ORDER — METHYLPREDNISOLONE SODIUM SUCC 125 MG IJ SOLR
60.0000 mg | Freq: Four times a day (QID) | INTRAMUSCULAR | Status: DC
  Administered 2019-08-04 – 2019-08-08 (×14): 60 mg via INTRAVENOUS
  Filled 2019-08-04 (×15): qty 2

## 2019-08-04 MED ORDER — MOMETASONE FURO-FORMOTEROL FUM 100-5 MCG/ACT IN AERO
2.0000 | INHALATION_SPRAY | Freq: Two times a day (BID) | RESPIRATORY_TRACT | Status: DC
  Administered 2019-08-04 – 2019-08-05 (×2): 2 via RESPIRATORY_TRACT
  Filled 2019-08-04: qty 8.8

## 2019-08-04 MED ORDER — METHYLPREDNISOLONE SODIUM SUCC 125 MG IJ SOLR: 125.0000 mg | Freq: Once | INTRAMUSCULAR | Status: DC

## 2019-08-04 MED ORDER — INSULIN GLARGINE 100 UNIT/ML ~~LOC~~ SOLN
14.0000 [IU] | Freq: Every day | SUBCUTANEOUS | Status: DC
  Administered 2019-08-04: 14 [IU] via SUBCUTANEOUS
  Filled 2019-08-04 (×5): qty 0.14

## 2019-08-04 MED ORDER — ACETAMINOPHEN 650 MG RE SUPP: 650.0000 mg | Freq: Four times a day (QID) | RECTAL | Status: DC | PRN

## 2019-08-04 MED ORDER — IPRATROPIUM-ALBUTEROL 0.5-2.5 (3) MG/3ML IN SOLN
3.0000 mL | RESPIRATORY_TRACT | Status: DC
  Administered 2019-08-04 – 2019-08-11 (×36): 3 mL via RESPIRATORY_TRACT
  Filled 2019-08-04 (×36): qty 3

## 2019-08-04 MED ORDER — ATORVASTATIN CALCIUM 40 MG PO TABS
40.0000 mg | ORAL_TABLET | Freq: Every day | ORAL | Status: DC
  Administered 2019-08-05 – 2019-08-10 (×6): 40 mg via ORAL
  Filled 2019-08-04 (×6): qty 1

## 2019-08-04 MED ORDER — INSULIN ASPART 100 UNIT/ML ~~LOC~~ SOLN
0.0000 [IU] | Freq: Three times a day (TID) | SUBCUTANEOUS | Status: DC
  Administered 2019-08-05: 15 [IU] via SUBCUTANEOUS
  Administered 2019-08-05: 20 [IU] via SUBCUTANEOUS
  Administered 2019-08-05: 4 [IU] via SUBCUTANEOUS
  Administered 2019-08-06: 15 [IU] via SUBCUTANEOUS
  Administered 2019-08-06: 20 [IU] via SUBCUTANEOUS
  Administered 2019-08-06: 11 [IU] via SUBCUTANEOUS
  Administered 2019-08-07: 20 [IU] via SUBCUTANEOUS
  Administered 2019-08-07: 0 [IU] via SUBCUTANEOUS
  Administered 2019-08-07: 15 [IU] via SUBCUTANEOUS
  Administered 2019-08-08: 20 [IU] via SUBCUTANEOUS
  Administered 2019-08-08: 7 [IU] via SUBCUTANEOUS
  Administered 2019-08-08: 11 [IU] via SUBCUTANEOUS
  Administered 2019-08-09 – 2019-08-10 (×3): 15 [IU] via SUBCUTANEOUS
  Administered 2019-08-10 (×2): 7 [IU] via SUBCUTANEOUS
  Administered 2019-08-11 (×2): 4 [IU] via SUBCUTANEOUS
  Administered 2019-08-11: 11 [IU] via SUBCUTANEOUS

## 2019-08-04 MED ORDER — PANTOPRAZOLE SODIUM 40 MG PO TBEC
40.0000 mg | DELAYED_RELEASE_TABLET | Freq: Every day | ORAL | Status: DC
  Administered 2019-08-04 – 2019-08-11 (×8): 40 mg via ORAL
  Filled 2019-08-04 (×7): qty 1

## 2019-08-04 NOTE — H&P (Addendum)
History and Physical  [facility]  [name] JYN:[id number] DOB: May 17, 1965 DOA: 08/04/2019  PCP: Robert Apley, NP  Patient coming from: Home by [facility]  I have personally briefly reviewed patient's old medical records in [facility]  Chief Complaint: SOB   HPI: Robert Rich is a 54 y.o. male former smoker with medical history significant for severe COPD, coronary artery disease, type 2 diabetes mellitus, dyslipidemia, alcohol use disorder and substance abuse with benzodiazepines and history of prior cocaine use was recently hospitalized discharged 2 weeks ago for a COPD exacerbation.  He reports that he felt well after going home but when he ran out of steroids and antibiotics his symptoms slowly began to return over the course of the last 3 days.  He became so severely short of breath today that he called EMS.  He says that he had return to work last week and was working short hours up until 3 days ago when he had increased his work hours and noticed that his shortness of breath became acutely worse.  He reports significant wheezing and increased work of breathing coughing and chest discomfort.  He denies chest pain.  He reports productive cough with yellow sputum.  He can only walk a few feet without severe shortness of breath.  He was seen by EMS and they have given him continuous nebulizer treatment and magnesium with no significant improvement.  He was seen in the emergency department given IV steroids and nebulizer treatments.  When they tried to ambulate him for short distance his pulse ox dropped to 55%.  They were unable to send him home.  He is being admitted for recurrent COPD exacerbation with acute on chronic respiratory failure with hypoxia.  Review of Systems: As per HPI otherwise 10 point review of systems negative.   Past Medical History:  Diagnosis Date  . COPD (chronic obstructive pulmonary disease) (HCC)   . Coronary artery disease   . DM (diabetes  mellitus) (HCC)   . Heart attack (HCC)   . HLD (hyperlipidemia)   . HTN (hypertension)   . Obesity   . Stroke [facility])     Past Surgical History:  Procedure Laterality Date  . INTRAVASCULAR PRESSURE WIRE/FFR STUDY N/A 03/18/2019   Procedure: INTRAVASCULAR PRESSURE WIRE/FFR STUDY;  Surgeon: [name], MD;  Location: [facility] INVASIVE CV LAB;  Service: Cardiovascular;  Laterality: N/A;  . LEFT HEART CATH AND CORONARY ANGIOGRAPHY N/A 03/18/2019   Procedure: LEFT HEART CATH AND CORONARY ANGIOGRAPHY;  Surgeon: [name], MD;  Location: [facility] INVASIVE CV LAB;  Service: Cardiovascular;  Laterality: N/A;  . NO PAST SURGERIES       reports that he quit smoking about 4 months ago. His smoking use included cigarettes. He has a 2.00 pack-year smoking history. He has never used smokeless tobacco. He reports previous alcohol use of about 12.0 standard drinks of alcohol per week. He reports previous drug use.  Allergies  Allergen Reactions  . Asa [Aspirin] Hives  . Zolmitriptan Nausea And Vomiting and Other (See Comments)    Severe headaches    Family History  Problem Relation Age of Onset  . Kidney disease Father   . Heart disease Mother   . Heart failure Mother   . Atrial fibrillation Mother      Prior to Admission medications   Medication Sig Start Date End Date Taking? Authorizing Provider  ALPRAZolam (XANAX) 0.5 MG tablet Take 1 tablet (0.5 mg total) by mouth every 12 (twelve)  hours as needed for anxiety. Patient taking differently: Take 1 mg by mouth every 12 (twelve) hours as needed for anxiety.  07/22/19  Yes [name], MD  atorvastatin (LIPITOR) 40 MG tablet Take 1 tablet (40 mg total) by mouth daily at 6 PM. 07/22/19  Yes [name], MD  clopidogrel (PLAVIX) 75 MG tablet Take 1 tablet (75 mg total) by mouth daily. 07/22/19  Yes [name], MD  folic acid (FOLVITE) 1 MG tablet Take 1 tablet (1 mg total) by mouth daily. 07/22/19  Yes [name], MD  HYDROcodone-homatropine  [facility]) 5-1.5 MG/5ML syrup Take 5 mLs by mouth every 8 (eight) hours as needed (refractory coughing spells). 07/22/19  Yes [name], MD  Ipratropium-Albuterol (COMBIVENT) 20-100 MCG/ACT AERS respimat Inhale 1 puff into the lungs every 6 (six) hours as needed for wheezing or shortness of breath. 07/22/19  Yes [name], MD  metFORMIN (GLUCOPHAGE) 1000 MG tablet Take 1 tablet (1,000 mg total) by mouth 2 (two) times daily with a meal. 07/22/19  Yes [name], MD  metoprolol succinate (TOPROL-XL) 25 MG 24 hr tablet Take 1 tablet (25 mg total) by mouth daily. 07/22/19  Yes [name], MD  Multiple Vitamin (MULTIVITAMIN WITH MINERALS) TABS tablet Take 1 tablet by mouth daily. 07/23/19  Yes [name], MD  thiamine 100 MG tablet Take 2.5 tablets (250 mg total) by mouth daily. 07/22/19  Yes [name], MD  umeclidinium-vilanterol [facility] ELLIPTA) 62.5-25 MCG/INH AEPB Inhale 1 puff into the lungs daily. 07/22/19 10/20/19 Yes [name], MD  butalbital-acetaminophen-caffeine (FIORICET) [phone] MG tablet Take 1 tablet by mouth every 6 (six) hours as needed for migraine. 07/22/19   [name], MD  losartan (COZAAR) 25 MG tablet Take 1 tablet (25 mg total) by mouth daily. 04/18/19 07/18/19  [name], MD  predniSONE (DELTASONE) 20 MG tablet Take 1 tablet (20 mg total) by mouth daily. Take 3 tablets by mouth daily x1 day; then 2 tablets by mouth daily x2 days; then 1 tablet by mouth daily x3 days; then half tablet by mouth daily x3 days and stop prednisone. Patient not taking: Reported on 08/04/2019 07/22/19 07/21/20  [name], MD    Physical Exam: Vitals:   08/04/19 1137 08/04/19 1200 08/04/19 1230 08/04/19 1300  BP:  116/80 127/78 121/77  Pulse:  78 94 92  Resp:  14 (!) 21 19  Temp:      TempSrc:      SpO2: 97% 100% 94% 94%  Weight:      Height:        Constitutional: NAD, calm, comfortable.  Eyes: PERRL, lids and conjunctivae normal ENMT: Mucous membranes are moist.  Posterior pharynx clear of any exudate or lesions.Normal dentition.  Neck: normal, supple, no masses, no thyromegaly Respiratory: Poor air movement bilaterally with diffuse expiratory wheezes heard posteriorly and bilaterally.  Tachypnea with moderate increased work of breathing.  Cardiovascular: Regular rate and rhythm, no murmurs / rubs / gallops. No extremity edema. 2+ pedal pulses. No carotid bruits.  Abdomen: no tenderness, no masses palpated. No hepatosplenomegaly. Bowel sounds positive.  Musculoskeletal: no clubbing / cyanosis. No joint deformity upper and lower extremities. Good ROM, no contractures. Normal muscle tone.  Skin: no rashes, lesions, ulcers. No induration Neurologic: CN 2-12 grossly intact. Sensation intact, DTR normal. Strength 5/5 in all 4.  Psychiatric: Normal judgment and insight. Alert and oriented x 3. Normal mood.   Labs on Admission: I have personally reviewed following labs and imaging studies  CBC: Recent  Labs  Lab 08/04/19 1145  WBC 12.6*  HGB 13.1  HCT 40.8  MCV 89.1  PLT 341   Basic Metabolic Panel: Recent Labs  Lab 08/04/19 1145  NA 137  K 4.1  CL 101  CO2 24  GLUCOSE 229*  BUN 12  CREATININE 0.59*  CALCIUM 8.6*   GFR: Estimated Creatinine Clearance: 125.2 mL/min (A) (by C-G formula based on SCr of 0.59 mg/dL (L)). Liver Function Tests: No results for input(s): AST, ALT, ALKPHOS, BILITOT, PROT, ALBUMIN in the last 168 hours. No results for input(s): LIPASE, AMYLASE in the last 168 hours. No results for input(s): AMMONIA in the last 168 hours. Coagulation Profile: No results for input(s): INR, PROTIME in the last 168 hours. Cardiac Enzymes: No results for input(s): CKTOTAL, CKMB, CKMBINDEX, TROPONINI in the last 168 hours. BNP (last 3 results) No results for input(s): PROBNP in the last 8760 hours. HbA1C: No results for input(s): HGBA1C in the last 72 hours. CBG: No results for input(s): GLUCAP in the last 168 hours. Lipid  Profile: No results for input(s): CHOL, HDL, LDLCALC, TRIG, CHOLHDL, LDLDIRECT in the last 72 hours. Thyroid Function Tests: No results for input(s): TSH, T4TOTAL, FREET4, T3FREE, THYROIDAB in the last 72 hours. Anemia Panel: No results for input(s): VITAMINB12, FOLATE, FERRITIN, TIBC, IRON, RETICCTPCT in the last 72 hours. Urine analysis:    Component Value Date/Time   COLORURINE YELLOW 10/09/2017 1819   APPEARANCEUR CLEAR 10/09/2017 1819   LABSPEC 1.021 10/09/2017 1819   PHURINE 5.0 10/09/2017 1819   GLUCOSEU NEGATIVE 10/09/2017 1819   HGBUR NEGATIVE 10/09/2017 1819   BILIRUBINUR NEGATIVE 10/09/2017 1819   KETONESUR NEGATIVE 10/09/2017 1819   PROTEINUR NEGATIVE 10/09/2017 1819   NITRITE NEGATIVE 10/09/2017 1819   LEUKOCYTESUR NEGATIVE 10/09/2017 1819    Radiological Exams on Admission: DG Chest Port 1 View  Result Date: 08/04/2019 CLINICAL DATA:  Cough, wheezing, COPD EXAM: PORTABLE CHEST 1 VIEW COMPARISON:  07/18/2019 FINDINGS: The heart size and mediastinal contours are within normal limits. Both lungs are clear. The visualized skeletal structures are unremarkable. IMPRESSION: No active disease. Electronically Signed   By: Helyn Numbers MD   On: 08/04/2019 11:59   EKG: Independently reviewed.   Assessment/Plan Principal Problem:   COPD with acute exacerbation (HCC) Active Problems:   Tobacco abuse   Class 1 obesity due to excess calories with serious comorbidity and body mass index (BMI) of 32.0 to 32.9 in adult   HTN (hypertension)   Anxiety   Substance abuse (HCC)   Coronary artery disease   HLD (hyperlipidemia)   Leukocytosis   Hyperglycemia   Steroid-induced hyperglycemia   1. Acute on chronic respiratory failure with hypoxia-secondary to COPD exacerbation-patient presenting with severe recurrent symptoms of COPD exacerbation failing outpatient management.  He requires inpatient management with IV steroids scheduled, DuoNeb scheduled, levofloxacin 750 mg p.o.  daily ordered, IV magnesium, supplemental oxygen.  Lasix for diuresis.  Follow clinical course closely.  On admitting him to a MedSurg bed monitor very low threshold to upgrade to a stepdown bed if he worsens or declines.  He says that he has an outpatient appointment to establish with pulmonary.  Will consider getting an inpatient consult if no significant improvement by tomorrow if pulmonary is available. 2. CAD-no chest pain symptoms-resume home medications and follow. 3. Leukocytosis likely steroid-induced will follow. 4. Tobacco-patient reports that he quit several months ago. 5. Polysubstance abuse-UDS ordered. 6. Hyperglycemia steroid-induced-see diabetes management. 7. Type 2 diabetes mellitus poorly controlled as evidenced by  hemoglobin A1c of 9%.  I have started Lantus and prandial insulin coverage with SSI coverage for hyperglycemia anticipated with steroid use. 8. GAD-he is using alprazolam as needed for symptoms.  I explained to him that this likely is not a long-term treatment for his disease and he would need behavioral health follow-up.  DVT prophylaxis: Lovenox Code Status: Full Family Communication: Updated at bedside Disposition Plan: Medically stable Consults called: N/A Admission status: INP  Rori Goar MD [facility] How to contact the [facility] Attending or Consulting provider 7A - 7P or covering provider during after hours 7P -7A, for this [facility]?  1. Check the care team in [facility] and look for a) attending/consulting [facility] provider listed and b) the [facility] team listed 2. Log into [email or web address] and use [place]'s universal password to access. If you do not have the password, please contact the hospital operator. 3. Locate the [facility] provider you are looking for under [facility] and page to a number that you can be directly reached. 4. If you still have difficulty reaching the provider, please page the [facility] LPDOC (Director on Call) for the Hospitalists listed on amion for  assistance.   If 7PM-7AM, please contact night-coverage [email or web address] Password [email or web address]  08/04/2019, 2:10 PM

## 2019-08-04 NOTE — ED Triage Notes (Signed)
Pt c/o SOB that began yesterday and worsened suddenly this morning while at work. Pt brought in by [facility] and was given 5mg  Albuterol, 0.5mg  Atrovent by neb, Solumedrol 125mg  and Magnesium 2gm by IV.

## 2019-08-04 NOTE — ED Notes (Signed)
Continuous nebulizer has finished. Pt reports slightly better breathing. Pt's work of breathing has decreased some. Expiratory wheezes are still auscultated.

## 2019-08-04 NOTE — ED Provider Notes (Signed)
[facility] EMERGENCY DEPARTMENT Provider Note   CSN: [id number] Arrival date & time: 08/04/19  1103     History Chief Complaint  Patient presents with  . Shortness of Breath    Robert Rich is a 54 y.o. male.  HPI   This patient is a chronically ill-appearing 54 year old male with known COPD, he states "bad COPD".  He also has a known history of coronary disease status post cardiac arrest in February 2021 thought to be secondary to respiratory failure.  He had a cardiac catheterization which showed no intervene able disease, he currently takes Plavix as well as breathing treatments for his COPD.  He was recently seen in the emergency department on June 21 at which time he was admitted with a COPD exacerbation and acute respiratory failure with hypoxia, he stayed in the hospital through 25 June and was discharged.  He finished his medications and has been in his usual state of health.  He stopped smoking in February.  He continues to use his breathing treatments.  He noticed last night that his breathing became worse, it has been severe overnight and got worse this morning.  He was given breathing treatments by EMS including a DuoNeb, he was given Solu-Medrol and given 2 g of magnesium sulfate and was placed on a nonrebreather due to severity of shortness of breath.  The symptoms are severe persistent and not associated with fevers or swelling of the legs.  This is similar to what he experienced in June.  Past Medical History:  Diagnosis Date  . COPD (chronic obstructive pulmonary disease) (HCC)   . Coronary artery disease   . DM (diabetes mellitus) (HCC)   . Heart attack (HCC)   . HLD (hyperlipidemia)   . HTN (hypertension)   . Obesity   . Stroke [facility])     Patient Active Problem List   Diagnosis Date Noted  . Acute respiratory failure with hypoxia (HCC)   . Class 1 obesity due to excess calories with serious comorbidity and body mass index (BMI) of 32.0 to 32.9 in adult   .  Essential hypertension   . Anxiety   . Substance abuse (HCC)   . Acute exacerbation of chronic obstructive pulmonary disease (COPD) (HCC) 07/18/2019  . Cardiac arrest (HCC) 03/17/2019  . 2019 novel coronavirus disease (COVID-19) 12/07/2018  . COPD exacerbation (HCC) 12/06/2018  . Tobacco abuse 12/06/2018    Past Surgical History:  Procedure Laterality Date  . INTRAVASCULAR PRESSURE WIRE/FFR STUDY N/A 03/18/2019   Procedure: INTRAVASCULAR PRESSURE WIRE/FFR STUDY;  Surgeon: [name], MD;  Location: [facility] INVASIVE CV LAB;  Service: Cardiovascular;  Laterality: N/A;  . LEFT HEART CATH AND CORONARY ANGIOGRAPHY N/A 03/18/2019   Procedure: LEFT HEART CATH AND CORONARY ANGIOGRAPHY;  Surgeon: [name], MD;  Location: [facility] INVASIVE CV LAB;  Service: Cardiovascular;  Laterality: N/A;  . NO PAST SURGERIES         Family History  Problem Relation Age of Onset  . Kidney disease Father   . Heart disease Mother   . Heart failure Mother   . Atrial fibrillation Mother     Social History   Tobacco Use  . Smoking status: Former Smoker    Packs/day: 0.10    Years: 20.00    Pack years: 2.00    Types: Cigarettes    Quit date: 03/17/2019    Years since quitting: 0.3  . Smokeless tobacco: Never Used  . Tobacco comment: Quit after recent HA  Vaping Use  .  Vaping Use: Never used  Substance Use Topics  . Alcohol use: Not Currently    Alcohol/week: 12.0 standard drinks    Types: 12 Cans of beer per week    Comment: Quit after recent HA  . Drug use: Not Currently    Home Medications Prior to Admission medications   Medication Sig Start Date End Date Taking? Authorizing Provider  ALPRAZolam Prudy Feeler) 0.5 MG tablet Take 1 tablet (0.5 mg total) by mouth every 12 (twelve) hours as needed for anxiety. 07/22/19   [name], MD  atorvastatin (LIPITOR) 40 MG tablet Take 1 tablet (40 mg total) by mouth daily at 6 PM. 07/22/19   [name], MD  butalbital-acetaminophen-caffeine (FIORICET) [phone]  MG tablet Take 1 tablet by mouth every 6 (six) hours as needed for migraine. 07/22/19   [name], MD  clopidogrel (PLAVIX) 75 MG tablet Take 1 tablet (75 mg total) by mouth daily. 07/22/19   [name], MD  folic acid (FOLVITE) 1 MG tablet Take 1 tablet (1 mg total) by mouth daily. 07/22/19   [name], MD  HYDROcodone-homatropine [facility]) 5-1.5 MG/5ML syrup Take 5 mLs by mouth every 8 (eight) hours as needed (refractory coughing spells). 07/22/19   [name], MD  Ipratropium-Albuterol (COMBIVENT) 20-100 MCG/ACT AERS respimat Inhale 1 puff into the lungs every 6 (six) hours as needed for wheezing or shortness of breath. 07/22/19   [name], MD  losartan (COZAAR) 25 MG tablet Take 1 tablet (25 mg total) by mouth daily. 04/18/19 07/18/19  [name], MD  losartan (COZAAR) 25 MG tablet Take 1 tablet (25 mg total) by mouth daily. 07/22/19 10/20/19  [name], MD  metFORMIN (GLUCOPHAGE) 1000 MG tablet Take 1 tablet (1,000 mg total) by mouth 2 (two) times daily with a meal. 07/22/19   [name], MD  metoprolol succinate (TOPROL-XL) 25 MG 24 hr tablet Take 1 tablet (25 mg total) by mouth daily. 07/22/19   [name], MD  Multiple Vitamin (MULTIVITAMIN WITH MINERALS) TABS tablet Take 1 tablet by mouth daily. 07/23/19   [name], MD  predniSONE (DELTASONE) 20 MG tablet Take 1 tablet (20 mg total) by mouth daily. Take 3 tablets by mouth daily x1 day; then 2 tablets by mouth daily x2 days; then 1 tablet by mouth daily x3 days; then half tablet by mouth daily x3 days and stop prednisone. 07/22/19 07/21/20  [name], MD  thiamine 100 MG tablet Take 2.5 tablets (250 mg total) by mouth daily. 07/22/19   [name], MD  umeclidinium-vilanterol [facility] ELLIPTA) 62.5-25 MCG/INH AEPB Inhale 1 puff into the lungs daily. 07/22/19 10/20/19  [name], MD    Allergies    Asa [aspirin] and Zolmitriptan  Review of Systems   Review of Systems  All other systems reviewed and are  negative.   Physical Exam Updated Vital Signs BP (!) 155/93 (BP Location: Left Arm)   Pulse 85   Temp 97.7 F (36.5 C) (Oral)   Resp (!) 25   Ht 1.753 m (5\' 9" )   Wt 101.1 kg   SpO2 98%   BMI 32.91 kg/m   Physical Exam Vitals and nursing note reviewed.  Constitutional:      General: He is in acute distress.     Appearance: He is well-developed.  HENT:     Head: Normocephalic and atraumatic.     Mouth/Throat:     Pharynx: No oropharyngeal exudate.  Eyes:     General: No scleral icterus.  Right eye: No discharge.        Left eye: No discharge.     Conjunctiva/sclera: Conjunctivae normal.     Pupils: Pupils are equal, round, and reactive to light.  Neck:     Thyroid: No thyromegaly.     Vascular: No JVD.  Cardiovascular:     Rate and Rhythm: Normal rate and regular rhythm.     Heart sounds: Normal heart sounds. No murmur heard.  No friction rub. No gallop.   Pulmonary:     Effort: Respiratory distress present.     Breath sounds: Wheezing present. No rales.  Abdominal:     General: Bowel sounds are normal. There is no distension.     Palpations: Abdomen is soft. There is no mass.     Tenderness: There is no abdominal tenderness.  Musculoskeletal:        General: No tenderness. Normal range of motion.     Cervical back: Normal range of motion and neck supple.  Lymphadenopathy:     Cervical: No cervical adenopathy.  Skin:    General: Skin is warm and dry.     Findings: No erythema or rash.  Neurological:     Mental Status: He is alert.     Coordination: Coordination normal.  Psychiatric:        Behavior: Behavior normal.     ED Results / Procedures / Treatments   Labs (all labs ordered are listed, but only abnormal results are displayed) Labs Reviewed  CBC  BASIC METABOLIC PANEL    EKG None  Radiology No results found.  Procedures .Critical Care Performed by: Eber Hong, MD Authorized by: Eber Hong, MD   Critical care provider  statement:    Critical care time (minutes):  35   Critical care time was exclusive of:  Separately billable procedures and treating other patients and teaching time   Critical care was necessary to treat or prevent imminent or life-threatening deterioration of the following conditions:  Respiratory failure   Critical care was time spent personally by me on the following activities:  Blood draw for specimens, development of treatment plan with patient or surrogate, discussions with consultants, evaluation of patient's response to treatment, examination of patient, obtaining history from patient or surrogate, ordering and performing treatments and interventions, ordering and review of laboratory studies, ordering and review of radiographic studies, pulse oximetry, re-evaluation of patient's condition and review of old charts   (including critical care time)  Medications Ordered in ED Medications  albuterol (PROVENTIL,VENTOLIN) solution continuous neb (has no administration in time range)    ED Course  I have reviewed the triage vital signs and the nursing notes.  Pertinent labs & imaging results that were available during my care of the patient were reviewed by me and considered in my medical decision making (see chart for details).    MDM Rules/Calculators/A&P                           This patient presents to the ED for concern of shortness of breath, this involves an extensive number of treatment options, and is a complaint that carries with it a high risk of complications and morbidity.  The differential diagnosis includes COVID-19 (the patient is already had this in November 2020, he refuses vaccination, he is afebrile), COPD exacerbation, pneumothorax, pneumonia, allergic reaction, cardiac disease (less likely given recent cardiac cath)   The patient was taken off the nonrebreather and maintained an  oxygen of around 95 to 96% though his work of breathing was increased.  Continuous  nebulizer ordered   Lab Tests:   I Ordered, reviewed, and interpreted labs, which included CBC, metabolic panel  Medicines ordered:   I ordered medication albuterol continuous nebulizer for 20 mg over 1 hour for COPD exacerbation, the patient already received steroids and magnesium prehospital  Imaging Studies ordered:   I ordered imaging studies which included portable chest x-ray and  I independently visualized and interpreted imaging which showed that there was no signs of infiltrate or pneumothorax, there is flattening of the diaphragms and hyperexpansion consistent with COPD  Additional history obtained:   Additional history obtained from medical record and paramedics  Previous records obtained and reviewed   Consultations Obtained:   I consulted hospitalist and discussed lab and imaging findings  Reevaluation:  After the interventions stated above, I reevaluated the patient and found the patient to be continually tachypneic, significant difficulty with ambulation, in fact his respiratory rate rose to 55 and he was severely dyspneic despite his oxygen remaining in an acceptable range.  Critical Interventions:  . Continuous nebulizer, 20 mg of albuterol over 1 hour . Prehospital medications including steroids magnesium and albuterol . Critical care   Final Clinical Impression(s) / ED Diagnoses Final diagnoses:  None  Respiratory distress COPD exacerbation    Eber HongMiller, Mackie Goon, MD 08/05/19 [phone]

## 2019-08-05 DIAGNOSIS — J441 Chronic obstructive pulmonary disease with (acute) exacerbation: Principal | ICD-10-CM

## 2019-08-05 DIAGNOSIS — T380X5A Adverse effect of glucocorticoids and synthetic analogues, initial encounter: Secondary | ICD-10-CM

## 2019-08-05 LAB — CBC WITH DIFFERENTIAL/PLATELET
Abs Immature Granulocytes: 0.05 10*3/uL (ref 0.00–0.07)
Basophils Absolute: 0 10*3/uL (ref 0.0–0.1)
Basophils Relative: 0 %
Eosinophils Absolute: 0 10*3/uL (ref 0.0–0.5)
Eosinophils Relative: 0 %
HCT: 40.5 % (ref 39.0–52.0)
Hemoglobin: 13.1 g/dL (ref 13.0–17.0)
Immature Granulocytes: 0 %
Lymphocytes Relative: 9 %
Lymphs Abs: 1.1 10*3/uL (ref 0.7–4.0)
MCH: 28.6 pg (ref 26.0–34.0)
MCHC: 32.3 g/dL (ref 30.0–36.0)
MCV: 88.4 fL (ref 80.0–100.0)
Monocytes Absolute: 0.4 10*3/uL (ref 0.1–1.0)
Monocytes Relative: 3 %
Neutro Abs: 11 10*3/uL — ABNORMAL HIGH (ref 1.7–7.7)
Neutrophils Relative %: 88 %
Platelets: 379 10*3/uL (ref 150–400)
RBC: 4.58 MIL/uL (ref 4.22–5.81)
RDW: 13.8 % (ref 11.5–15.5)
WBC: 12.6 10*3/uL — ABNORMAL HIGH (ref 4.0–10.5)
nRBC: 0 % (ref 0.0–0.2)

## 2019-08-05 LAB — COMPREHENSIVE METABOLIC PANEL
ALT: 23 U/L (ref 0–44)
AST: 14 U/L — ABNORMAL LOW (ref 15–41)
Albumin: 3.8 g/dL (ref 3.5–5.0)
Alkaline Phosphatase: 56 U/L (ref 38–126)
Anion gap: 11 (ref 5–15)
BUN: 16 mg/dL (ref 6–20)
CO2: 22 mmol/L (ref 22–32)
Calcium: 8.7 mg/dL — ABNORMAL LOW (ref 8.9–10.3)
Chloride: 100 mmol/L (ref 98–111)
Creatinine, Ser: 0.55 mg/dL — ABNORMAL LOW (ref 0.61–1.24)
GFR calc Af Amer: >60 mL/min (ref >60)
GFR calc non Af Amer: >60 mL/min (ref >60)
Glucose, Bld: 341 mg/dL — ABNORMAL HIGH (ref 70–99)
Potassium: 4.1 mmol/L (ref 3.5–5.1)
Sodium: 133 mmol/L — ABNORMAL LOW (ref 135–145)
Total Bilirubin: 0.3 mg/dL (ref 0.3–1.2)
Total Protein: 7.1 g/dL (ref 6.5–8.1)

## 2019-08-05 LAB — GLUCOSE, CAPILLARY
Glucose-Capillary: 407 mg/dL — ABNORMAL HIGH (ref 70–99)
Glucose-Capillary: 431 mg/dL — ABNORMAL HIGH (ref 70–99)
Glucose-Capillary: 361 mg/dL — ABNORMAL HIGH (ref 70–99)
Glucose-Capillary: 159 mg/dL — ABNORMAL HIGH (ref 70–99)
Glucose-Capillary: 273 mg/dL — ABNORMAL HIGH (ref 70–99)
Glucose-Capillary: 330 mg/dL — ABNORMAL HIGH (ref 70–99)
Glucose-Capillary: 276 mg/dL — ABNORMAL HIGH (ref 70–99)

## 2019-08-05 LAB — MAGNESIUM: Magnesium: 2.3 mg/dL (ref 1.7–2.4)

## 2019-08-05 MED ORDER — ARFORMOTEROL TARTRATE 15 MCG/2ML IN NEBU
15.0000 ug | INHALATION_SOLUTION | Freq: Two times a day (BID) | RESPIRATORY_TRACT | Status: DC
  Administered 2019-08-05 – 2019-08-06 (×2): 15 ug via RESPIRATORY_TRACT
  Filled 2019-08-05 (×2): qty 2

## 2019-08-05 MED ORDER — BUTALBITAL-APAP-CAFFEINE 50-325-40 MG PO TABS
1.0000 | ORAL_TABLET | Freq: Four times a day (QID) | ORAL | Status: DC | PRN
  Administered 2019-08-05: 2 via ORAL
  Administered 2019-08-05: 1 via ORAL
  Administered 2019-08-07 – 2019-08-10 (×6): 2 via ORAL
  Administered 2019-08-10: 1 via ORAL
  Filled 2019-08-05 (×4): qty 2
  Filled 2019-08-05: qty 1
  Filled 2019-08-05: qty 2
  Filled 2019-08-05: qty 1
  Filled 2019-08-05 (×2): qty 2

## 2019-08-05 MED ORDER — INSULIN ASPART 100 UNIT/ML ~~LOC~~ SOLN
12.0000 [IU] | Freq: Three times a day (TID) | SUBCUTANEOUS | Status: DC
  Administered 2019-08-05 (×2): 12 [IU] via SUBCUTANEOUS

## 2019-08-05 MED ORDER — INSULIN GLARGINE 100 UNIT/ML ~~LOC~~ SOLN
36.0000 [IU] | Freq: Every day | SUBCUTANEOUS | Status: DC
  Administered 2019-08-05: 36 [IU] via SUBCUTANEOUS
  Filled 2019-08-05 (×4): qty 0.36

## 2019-08-05 MED ORDER — INSULIN ASPART 100 UNIT/ML ~~LOC~~ SOLN
20.0000 [IU] | Freq: Once | SUBCUTANEOUS | Status: AC
  Administered 2019-08-05: 20 [IU] via SUBCUTANEOUS

## 2019-08-05 MED ORDER — VITAMIN D 25 MCG (1000 UNIT) PO TABS
5000.0000 [IU] | ORAL_TABLET | Freq: Every day | ORAL | Status: DC
  Administered 2019-08-05 – 2019-08-11 (×7): 5000 [IU] via ORAL
  Filled 2019-08-05 (×7): qty 5

## 2019-08-05 MED ORDER — BUDESONIDE 0.5 MG/2ML IN SUSP
0.5000 mg | Freq: Two times a day (BID) | RESPIRATORY_TRACT | Status: DC
  Administered 2019-08-05 – 2019-08-11 (×12): 0.5 mg via RESPIRATORY_TRACT
  Filled 2019-08-05 (×12): qty 2

## 2019-08-05 MED ORDER — INSULIN ASPART 100 UNIT/ML ~~LOC~~ SOLN
15.0000 [IU] | Freq: Three times a day (TID) | SUBCUTANEOUS | Status: DC
  Administered 2019-08-05: 15 [IU] via SUBCUTANEOUS

## 2019-08-05 MED ORDER — INSULIN GLARGINE 100 UNIT/ML ~~LOC~~ SOLN
36.0000 [IU] | Freq: Every day | SUBCUTANEOUS | Status: DC
  Filled 2019-08-05 (×3): qty 0.36

## 2019-08-05 MED ORDER — INSULIN GLARGINE 100 UNIT/ML ~~LOC~~ SOLN
45.0000 [IU] | Freq: Every day | SUBCUTANEOUS | Status: DC
  Filled 2019-08-05 (×4): qty 0.45

## 2019-08-05 MED ORDER — MONTELUKAST SODIUM 10 MG PO TABS
10.0000 mg | ORAL_TABLET | Freq: Every day | ORAL | Status: DC
  Administered 2019-08-05 – 2019-08-10 (×6): 10 mg via ORAL
  Filled 2019-08-05 (×6): qty 1

## 2019-08-05 MED ORDER — GUAIFENESIN ER 600 MG PO TB12
1200.0000 mg | ORAL_TABLET | Freq: Two times a day (BID) | ORAL | Status: DC
  Administered 2019-08-05 – 2019-08-11 (×13): 1200 mg via ORAL
  Filled 2019-08-05 (×13): qty 2

## 2019-08-05 MED ORDER — VITAMIN D (ERGOCALCIFEROL) 1.25 MG (50000 UNIT) PO CAPS
50000.0000 [IU] | ORAL_CAPSULE | ORAL | Status: DC
  Administered 2019-08-05 – 2019-08-09 (×2): 50000 [IU] via ORAL
  Filled 2019-08-05 (×4): qty 1

## 2019-08-05 MED ORDER — POTASSIUM CHLORIDE IN NACL 20-0.45 MEQ/L-% IV SOLN: INTRAVENOUS | Status: DC

## 2019-08-05 MED ORDER — SODIUM CHLORIDE 0.45 % IV BOLUS
1000.0000 mL | Freq: Once | INTRAVENOUS | Status: AC
  Administered 2019-08-05: 1000 mL via INTRAVENOUS

## 2019-08-05 MED ORDER — LIVING WELL WITH DIABETES BOOK: Freq: Once | Status: AC

## 2019-08-05 NOTE — TOC Initial Note (Signed)
Transition of Care [facility]) - Initial/Assessment Note    Patient Details  Name: Robert Rich MRN: [id number] Date of Birth: [birth year]  [facility]) CM/SW Contact:    Karn Cassis, LCSW Phone Number: 08/05/2019, 9:46 AM  Clinical Narrative:  LCSW completed assessment with pt due to high risk readmission score. Pt admitted due to COPD with acute exacerbation. Pt reports he lives alone, but has support from his wife. His wife lives in [place] due to her work. Pt is independent with ADLs. He reports he has a nebulizer. 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 He feels he can no longer work and is starting process for disability. Pt currently does not have insurance. Per pt, he will be added to his wife's insurance very soon. Pt plans to return home when medically stable. Pt's PCP is [facility]. He indicates he has an appointment next week already scheduled. No needs reported at this time. TOC will continue to follow.                  Expected Discharge Plan: Home/Self Care Barriers to Discharge: Inadequate or no insurance   Patient Goals and CMS Choice Patient states their goals for this hospitalization and ongoing recovery are:: return home      Expected Discharge Plan and Services Expected Discharge Plan: Home/Self Care In-house Referral: Clinical Social Work     Living arrangements for the past 2 months: Single Family Home                                      Prior Living Arrangements/Services Living arrangements for the past 2 months: Single Family Home Lives with:: Spouse Patient language and need for interpreter reviewed:: Yes Do you feel safe going back to the place where you live?: Yes      Need for Family Participation in Patient Care: No (Comment)     Criminal Activity/Legal Involvement Pertinent to Current Situation/Hospitalization: No - Comment as needed  Activities of Daily Living Home  Assistive Devices/Equipment: Nebulizer ADL Screening (condition at time of admission) Patient's cognitive ability adequate to safely complete daily activities?: Yes Is the patient deaf or have difficulty hearing?: No Does the patient have difficulty seeing, even when wearing glasses/contacts?: No Does the patient have difficulty concentrating, remembering, or making decisions?: No Patient able to express need for assistance with ADLs?: Yes Does the patient have difficulty dressing or bathing?: No Independently performs ADLs?: Yes (appropriate for developmental age) Does the patient have difficulty walking or climbing stairs?: No Weakness of Legs: None Weakness of Arms/Hands: None  Permission Sought/Granted                  Emotional Assessment Appearance:: Appears stated age Attitude/Demeanor/Rapport: Engaged Affect (typically observed): Appropriate Orientation: : Oriented to Self, Oriented to Place, Oriented to  Time, Oriented to Situation Alcohol / Substance Use: Alcohol Use (history\) Psych Involvement: No (comment)  Admission diagnosis:  COPD with acute exacerbation (HCC) [J44.1] Patient Active Problem List   Diagnosis Date Noted  . COPD with acute exacerbation (HCC) 08/04/2019  . Coronary artery disease   . HLD (hyperlipidemia)   . Leukocytosis   . Hyperglycemia   . Steroid-induced hyperglycemia   . Acute respiratory failure with hypoxia (HCC)   . Class 1 obesity due to excess calories with serious comorbidity  and body mass index (BMI) of 32.0 to 32.9 in adult   . HTN (hypertension)   . Anxiety   . Substance abuse (HCC)   . Cardiac arrest (HCC) 03/17/2019  . 2019 novel coronavirus disease (COVID-19) 12/07/2018  . Tobacco abuse 12/06/2018   PCP:  Rebecka Apley, NP Pharmacy:   [facility] - [place], [place] - [address] [place] [address] Phone: [phone] Fax: [phone]  [facility] [address] [place], [place] - [place]  [address] [place] [address] [place] [address] Phone: [phone] Fax: [phone]     Social Determinants of Health (SDOH) Interventions    Readmission Risk Interventions Readmission Risk Prevention Plan 08/05/2019  Transportation Screening Complete  Medication Review (RN Care Manager) Complete  HRI or Home Care Consult Complete  SW Recovery Care/Counseling Consult Complete  Palliative Care Screening Not Applicable  Skilled Nursing Facility Not Applicable  Some recent data might be hidden

## 2019-08-05 NOTE — Consult Note (Addendum)
NAME:  Robert Rich, MRN:  [id number], DOB:  1965/09/10, LOS: 1 ADMISSION DATE:  08/04/2019, CONSULTATION DATE:  08/05/2019 REFERRING MD:  Dr. Laural Benes, CHIEF COMPLAINT:  Short of breath   Brief History   54 yo male former smoker with hx of cocaine abuse presented to ER on 08/04/19 with worsening dyspnea, wheezing, and chest discomfort.  Symptoms started after running out of steroids and antibiotics after admission for COPD exacerbation from 07/18/19 to 07/22/19.  Admitted for COPD exacerbation and PCCM asked to assist with respiratory management.  History of present illness   He was in hospital in June for COPD exacerbation.  His UDS then was positive for cocaine, but he isn't sure how cocaine got into his system.  He denies smoking cigarettes or marijuana recently.  He was feeling some improvement until he ran out of script for steroids and antibiotics.  His breathing started getting worse again.  He was having cough, chest congestion, and wheezing.  He wasn't able to bring up phlegm.  He was at work and felt like he couldn't breath anymore, so he was brought back to the hospital.    He has some improvement again with therapy, but still feels chest congestion and wheezing.  Chest is sore when he takes a deep breath.  Winded with walking in his room.  Eosinophil count 1.4 K/ul on CBC from 07/18/19.  Past Medical History  CAD, DM, HLD, ETOH, Benzodiazepine abuse, Cocaine abuse, Rt cerebellar CVA, HTN, COVID 19 infection November 2020, Influenza February 2020, PEA cardiac arrest February 2021 from COPD exacerbation  Significant Hospital Events   7/08 Admit  Consults:    Procedures:    Significant Diagnostic Tests:  LHC 03/18/19 >> 50% stenosis Ost LAD Echo 03/18/19 >> EF 55 to 60% CT chest 03/28/19 >> multiple nodules up to 4 mm  Micro Data:  COVID 7/08 >> negative  Antimicrobials:  Levaquin 7/08 >>   Interim history/subjective:    Objective   Blood pressure (!) 152/81, pulse 86,  temperature 98 F (36.7 C), temperature source Oral, resp. rate 16, height 5\' 9"  (1.753 m), weight 101.1 kg, SpO2 96 %.        Intake/Output Summary (Last 24 hours) at 08/05/2019 1353 Last data filed at 08/05/2019 0600 Gross per 24 hour  Intake 1388.83 ml  Output --  Net 1388.83 ml   Filed Weights   08/04/19 1113  Weight: 101.1 kg    Examination:  General - alert Eyes - pupils reactive ENT - no sinus tenderness, no stridor Cardiac - regular rate/rhythm, no murmur Chest - b/l expiratory wheezing Abdomen - soft, non tender, + bowel sounds Extremities - no cyanosis, clubbing, or edema Skin - no rashes Neuro - normal strength, moves extremities, follows commands Psych - normal mood and behavior Lymphatics - no lymphadenopathy  Resolved Hospital Problem list     Assessment & Plan:   Acute hypoxic respiratory failure from COPD exacerbation. - might have component of asthma also - Change to pulmicort, brovana in place of dulera for now - add singulair - scheduled duoneb - flutter valve, mucinex - continue solumedrol 60 mg q6h for now - will need PFT as outpt - day 2 of levaquin - was using anoro as outpt >> likely will need ICS in additional to LAMA/LABA as outpt  CAD, HTN, HLD. 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 - SSI   Best practice:  Diet: carb modified DVT prophylaxis: lovenox GI  prophylaxis: protonix Mobility: as tolerated Code Status: med surg Disposition: telemetry  Labs   CBC: Recent Labs  Lab 08/04/19 1145 08/05/19 0632  WBC 12.6* 12.6*  NEUTROABS  --  11.0*  HGB 13.1 13.1  HCT 40.8 40.5  MCV 89.1 88.4  PLT 341 379    Basic Metabolic Panel: Recent Labs  Lab 08/04/19 1145 08/05/19 0632  NA 137 133*  K 4.1 4.1  CL 101 100  CO2 24 22  GLUCOSE 229* 341*  BUN 12 16  CREATININE 0.59* 0.55*  CALCIUM 8.6* 8.7*  MG 2.3 2.3   GFR: Estimated Creatinine Clearance: 125.2 mL/min (A) (by C-G  formula based on SCr of 0.55 mg/dL (L)). Recent Labs  Lab 08/04/19 1145 08/05/19 0632  WBC 12.6* 12.6*    Liver Function Tests: Recent Labs  Lab 08/05/19 0632  AST 14*  ALT 23  ALKPHOS 56  BILITOT 0.3  PROT 7.1  ALBUMIN 3.8   No results for input(s): LIPASE, AMYLASE in the last 168 hours. No results for input(s): AMMONIA in the last 168 hours.  ABG    Component Value Date/Time   HCO3 21.9 03/17/2019 1457   TCO2 23 03/17/2019 1457   ACIDBASEDEF 5.0 (H) 03/17/2019 1457   O2SAT 99.0 03/17/2019 1457     Coagulation Profile: No results for input(s): INR, PROTIME in the last 168 hours.  Cardiac Enzymes: No results for input(s): CKTOTAL, CKMB, CKMBINDEX, TROPONINI in the last 168 hours.  HbA1C: Hgb A1c MFr Bld  Date/Time Value Ref Range Status  07/18/2019 11:40 AM 9.4 (H) 4.8 - 5.6 % Final    Comment:    (NOTE)         Prediabetes: 5.7 - 6.4         Diabetes: >6.4         Glycemic control for adults with diabetes: <7.0   03/18/2019 12:06 AM 7.3 (H) 4.8 - 5.6 % Final    Comment:    (NOTE) Pre diabetes:          5.7%-6.4% Diabetes:              >6.4% Glycemic control for   <7.0% adults with diabetes     CBG: Recent Labs  Lab 08/04/19 2315 08/05/19 0227 08/05/19 0323 08/05/19 0836 08/05/19 1143  GLUCAP 374* 407* 431* 361* 159*    Review of Systems:   Reviewed and negative  Past Medical History  He,  has a past medical history of COPD (chronic obstructive pulmonary disease) (HCC), Coronary artery disease, DM (diabetes mellitus) (HCC), Heart attack (HCC), HLD (hyperlipidemia), HTN (hypertension), Obesity, and Stroke (HCC).   Surgical History    Past Surgical History:  Procedure Laterality Date  . INTRAVASCULAR PRESSURE WIRE/FFR STUDY N/A 03/18/2019   Procedure: INTRAVASCULAR PRESSURE WIRE/FFR STUDY;  Surgeon: [name], MD;  Location: [facility] INVASIVE CV LAB;  Service: Cardiovascular;  Laterality: N/A;  . LEFT HEART CATH AND CORONARY ANGIOGRAPHY N/A  03/18/2019   Procedure: LEFT HEART CATH AND CORONARY ANGIOGRAPHY;  Surgeon: [name], MD;  Location: [facility] INVASIVE CV LAB;  Service: Cardiovascular;  Laterality: N/A;  . NO PAST SURGERIES       Social History   reports that he quit smoking about 4 months ago. His smoking use included cigarettes. He has a 2.00 pack-year smoking history. He has never used smokeless tobacco. He reports previous alcohol use of about 12.0 standard drinks of alcohol per week. He reports previous drug use.   Family History   His  family history includes Atrial fibrillation in his mother; Heart disease in his mother; Heart failure in his mother; Kidney disease in his father.   Allergies Allergies  Allergen Reactions  . Asa [Aspirin] Hives  . Zolmitriptan Nausea And Vomiting and Other (See Comments)    Severe headaches     Home Medications  Prior to Admission medications   Medication Sig Start Date End Date Taking? Authorizing Provider  ALPRAZolam Prudy Feeler) 0.5 MG tablet Take 1 tablet (0.5 mg total) by mouth every 12 (twelve) hours as needed for anxiety. Patient taking differently: Take 1 mg by mouth every 12 (twelve) hours as needed for anxiety.  07/22/19  Yes [name], MD  atorvastatin (LIPITOR) 40 MG tablet Take 1 tablet (40 mg total) by mouth daily at 6 PM. 07/22/19  Yes [name], MD  clopidogrel (PLAVIX) 75 MG tablet Take 1 tablet (75 mg total) by mouth daily. 07/22/19  Yes [name], MD  folic acid (FOLVITE) 1 MG tablet Take 1 tablet (1 mg total) by mouth daily. 07/22/19  Yes [name], MD  HYDROcodone-homatropine [facility]) 5-1.5 MG/5ML syrup Take 5 mLs by mouth every 8 (eight) hours as needed (refractory coughing spells). 07/22/19  Yes [name], MD  Ipratropium-Albuterol (COMBIVENT) 20-100 MCG/ACT AERS respimat Inhale 1 puff into the lungs every 6 (six) hours as needed for wheezing or shortness of breath. 07/22/19  Yes [name], MD  metFORMIN (GLUCOPHAGE) 1000 MG tablet Take 1  tablet (1,000 mg total) by mouth 2 (two) times daily with a meal. 07/22/19  Yes [name], MD  metoprolol succinate (TOPROL-XL) 25 MG 24 hr tablet Take 1 tablet (25 mg total) by mouth daily. 07/22/19  Yes [name], MD  Multiple Vitamin (MULTIVITAMIN WITH MINERALS) TABS tablet Take 1 tablet by mouth daily. 07/23/19  Yes [name], MD  thiamine 100 MG tablet Take 2.5 tablets (250 mg total) by mouth daily. 07/22/19  Yes [name], MD  umeclidinium-vilanterol [facility] ELLIPTA) 62.5-25 MCG/INH AEPB Inhale 1 puff into the lungs daily. 07/22/19 10/20/19 Yes [name], MD  butalbital-acetaminophen-caffeine (FIORICET) [phone] MG tablet Take 1 tablet by mouth every 6 (six) hours as needed for migraine. 07/22/19   [name], MD  losartan (COZAAR) 25 MG tablet Take 1 tablet (25 mg total) by mouth daily. 04/18/19 07/18/19  [name], MD  predniSONE (DELTASONE) 20 MG tablet Take 1 tablet (20 mg total) by mouth daily. Take 3 tablets by mouth daily x1 day; then 2 tablets by mouth daily x2 days; then 1 tablet by mouth daily x3 days; then half tablet by mouth daily x3 days and stop prednisone. Patient not taking: Reported on 08/04/2019 07/22/19 07/21/20  [name], MD     Signature:  Coralyn Helling, MD [facility] Pulmonary/Critical Care Pager - [phone] 08/05/2019, 2:30 PM

## 2019-08-05 NOTE — Progress Notes (Signed)
Patient called nurse about 20:20 stated he really started wheezing and turned red after receiving 3 nebs duoneb , brovona ,pulmicort. Nurse checked vitals

## 2019-08-05 NOTE — Progress Notes (Addendum)
[facility] night shift.  The staff reports that the patient's CBG results have been persistently elevated as he is on methylprednisolone 60 mg IVP every 6 hrs for acute COPD exacerbation (See results below). He received Lantus 14 units at 2109 and 9 units of RI at 2327. His HbA1C was 9.4% on 07/18/2019.   Glucose, capillary [[id number]] (Abnormal)   Collected: 08/05/19 0323   Updated: 08/05/19 0324    Glucose-Capillary 431High mg/dL  Glucose, capillary [[id number]] (Abnormal)   Collected: 08/05/19 0227   Updated: 08/05/19 0228    Glucose-Capillary 407High mg/dL  Glucose, capillary [[id number]] (Abnormal)   Collected: 08/04/19 2315   Updated: 08/04/19 2316    Glucose-Capillary 374High mg/dL  Glucose, capillary [[id number]] (Abnormal)   Collected: 08/04/19 2158   Updated: 08/04/19 2159    Glucose-Capillary 408High mg/dL   EKG Sinus rhythm. CxR Normal heart size and no active disease. Renal function is normal.  Assessment:  Type 2 diabetes with glucocorticoid induced hyperglycemia  COPD with acute exacerbation. Novolog 20 units SQ x1. 0.45% NaCl 1000 mL bolus. Start 0.45%+KCl 20 meq at 125 mL/hr. Continue Lantus in the evenings. May need Q4 hr RI SS coverage if no significant improvement.  Sanda Klein, MD

## 2019-08-05 NOTE — Progress Notes (Signed)
PROGRESS NOTE   Robert Rich  JQB:[id number] DOB: December 19, 1965 DOA: 08/04/2019 PCP: Rebecka Apley, NP   Chief Complaint  Patient presents with  . Shortness of Breath    Brief Admission History:  54 y.o. male former smoker with medical history significant for severe COPD, coronary artery disease, type 2 diabetes mellitus, dyslipidemia, alcohol use disorder and substance abuse with benzodiazepines and history of prior cocaine use was recently hospitalized discharged 2 weeks ago for a COPD exacerbation.  He reports that he felt well after going home but when he ran out of steroids and antibiotics his symptoms slowly began to return over the course of the last 3 days.  He became so severely short of breath today that he called EMS.  He says that he had return to work last week and was working short hours up until 3 days ago when he had increased his work hours and noticed that his shortness of breath became acutely worse.  He reports significant wheezing and increased work of breathing coughing and chest discomfort.  He denies chest pain.  He reports productive cough with yellow sputum.  He can only walk a few feet without severe shortness of breath.  Assessment & Plan:   Principal Problem:   COPD with acute exacerbation (HCC) Active Problems:   Tobacco abuse   Class 1 obesity due to excess calories with serious comorbidity and body mass index (BMI) of 32.0 to 32.9 in adult   HTN (hypertension)   Anxiety   Substance abuse (HCC)   Coronary artery disease   HLD (hyperlipidemia)   Leukocytosis   Hyperglycemia   Steroid-induced hyperglycemia   1. Acute on chronic respiratory failure with hypoxia - secondary to COPD exacerbation - symptoms rapidly returned after he completed his steroid taper and antibiotics from recent hospitalization.  He continues to struggle to breathe.  He has an outpatient pulmonary appt later this month but I will ask for inpatient consult per patient request.  I  spoke with Dr. Craige Cotta and he agrees to consult on him.  2. Leukocytosis - steroid induced - following.  3. CAD - stable, no chest pain symptoms, resumed his home heart medications.  4. Tobacco - He reports he quit several months ago.  5. Polysubstance abuse - UDS positive for benzo, opioid and barbituates. 6. GAD - alprazolam as needed only for severe symptoms.  7. Type 2 DM with steroid induced hyperglycemia - increased doses of insulin and follow closely especially while on IV steroids.    DVT prophylaxis: enoxaparin Code Status:  Full  Family Communication: updated bedside  Disposition:   Status is: Inpatient  Remains inpatient appropriate because:IV treatments appropriate due to intensity of illness or inability to take PO and Inpatient level of care appropriate due to severity of illness  Dispo:  Patient From: Home  Planned Disposition: Home  Expected discharge date: 08/08/19  Medically stable for discharge: No   Consultants:  pccm pulmonology   Procedures:     Antimicrobials:  levofloxacin  Subjective: Pt remains very SOB not much improvement since admission unfortunately  Objective: Vitals:   08/05/19 0639 08/05/19 0754 08/05/19 0756 08/05/19 1213  BP: (!) 152/81     Pulse: 86     Resp: 16     Temp: 98 F (36.7 C)     TempSrc: Oral     SpO2: 94% 92% 96% 96%  Weight:      Height:        Intake/Output Summary (Last  24 hours) at 08/05/2019 1404 Last data filed at 08/05/2019 0600 Gross per 24 hour  Intake 1388.83 ml  Output --  Net 1388.83 ml   Filed Weights   08/04/19 1113  Weight: 101.1 kg   Examination:  General exam: Pt having some mild to moderate increased WOB. Audible wheezing heard.  Respiratory system: diffuse insp/exp wheezing, cough, poor air movement. tachypnea. Cardiovascular system: normal S1 & S2 heard. No JVD, murmurs, rubs, gallops or clicks. No pedal edema. Gastrointestinal system: Abdomen is nondistended, soft and nontender. No  organomegaly or masses felt. Normal bowel sounds heard. Central nervous system: Alert and oriented. No focal neurological deficits. Extremities: Symmetric 5 x 5 power. Skin: No rashes, lesions or ulcers Psychiatry: Judgement and insight appear normal. Mood & affect appropriate.   Data Reviewed: I have personally reviewed following labs and imaging studies  CBC: Recent Labs  Lab 08/04/19 1145 08/05/19 0632  WBC 12.6* 12.6*  NEUTROABS  --  11.0*  HGB 13.1 13.1  HCT 40.8 40.5  MCV 89.1 88.4  PLT 341 379    Basic Metabolic Panel: Recent Labs  Lab 08/04/19 1145 08/05/19 0632  NA 137 133*  K 4.1 4.1  CL 101 100  CO2 24 22  GLUCOSE 229* 341*  BUN 12 16  CREATININE 0.59* 0.55*  CALCIUM 8.6* 8.7*  MG 2.3 2.3    GFR: Estimated Creatinine Clearance: 125.2 mL/min (A) (by C-G formula based on SCr of 0.55 mg/dL (L)).  Liver Function Tests: Recent Labs  Lab 08/05/19 0632  AST 14*  ALT 23  ALKPHOS 56  BILITOT 0.3  PROT 7.1  ALBUMIN 3.8    CBG: Recent Labs  Lab 08/04/19 2315 08/05/19 0227 08/05/19 0323 08/05/19 0836 08/05/19 1143  GLUCAP 374* 407* 431* 361* 159*    Recent Results (from the past 240 hour(s))  SARS Coronavirus 2 by RT PCR (hospital order, performed in [facility] hospital lab) Nasopharyngeal Nasopharyngeal Swab     Status: None   Collection Time: 08/04/19  5:58 PM   Specimen: Nasopharyngeal Swab  Result Value Ref Range Status   SARS Coronavirus 2 NEGATIVE NEGATIVE Final    Comment: (NOTE) SARS-CoV-2 target nucleic acids are NOT DETECTED.  The SARS-CoV-2 RNA is generally detectable in upper and lower respiratory specimens during the acute phase of infection. The lowest concentration of SARS-CoV-2 viral copies this assay can detect is 250 copies / mL. A negative result does not preclude SARS-CoV-2 infection and should not be used as the sole basis for treatment or other patient management decisions.  A negative result may occur with improper  specimen collection / handling, submission of specimen other than nasopharyngeal swab, presence of viral mutation(s) within the areas targeted by this assay, and inadequate number of viral copies (<250 copies / mL). A negative result must be combined with clinical observations, patient history, and epidemiological information.  Fact Sheet for Patients:   BoilerBrush.com.cy  Fact Sheet for Healthcare Providers: https://pope.com/  This test is not yet approved or  cleared by the [place] FDA and has been authorized for detection and/or diagnosis of SARS-CoV-2 by FDA under an Emergency Use Authorization (EUA).  This EUA will remain in effect (meaning this test can be used) for the duration of the COVID-19 declaration under Section 564(b)(1) of the Act, 21 U.S.C. section 360bbb-3(b)(1), unless the authorization is terminated or revoked sooner.  Performed at [facility], [address], [place], [place] [address]      Radiology Studies: [facility] [place]  1 View  Result Date: 08/04/2019 CLINICAL DATA:  Cough, wheezing, COPD EXAM: PORTABLE CHEST 1 VIEW COMPARISON:  07/18/2019 FINDINGS: The heart size and mediastinal contours are within normal limits. Both lungs are clear. The visualized skeletal structures are unremarkable. IMPRESSION: No active disease. Electronically Signed   By: Helyn Numbers MD   On: 08/04/2019 11:59   Scheduled Meds: . atorvastatin  40 mg Oral q1800  . cholecalciferol  5,000 Units Oral Daily  . clopidogrel  75 mg Oral Daily  . enoxaparin (LOVENOX) injection  40 mg Subcutaneous Q24H  . folic acid  1 mg Oral Daily  . guaiFENesin  1,200 mg Oral BID  . insulin aspart  0-20 Units Subcutaneous TID WC  . insulin aspart  0-5 Units Subcutaneous QHS  . insulin aspart  12 Units Subcutaneous TID WC  . insulin glargine  36 Units Subcutaneous Daily  . ipratropium-albuterol  3 mL Nebulization Q4H  . levofloxacin  750 mg Oral  q1800  . living well with diabetes book   Does not apply Once  . losartan  25 mg Oral Daily  . methylPREDNISolone (SOLU-MEDROL) injection  60 mg Intravenous Q6H  . metoprolol succinate  25 mg Oral Daily  . mometasone-formoterol  2 puff Inhalation BID  . multivitamin with minerals  1 tablet Oral Daily  . pantoprazole  40 mg Oral Daily  . thiamine  250 mg Oral Daily  . Vitamin D (Ergocalciferol)  50,000 Units Oral Q7 days   Continuous Infusions: . 0.45 % NaCl with KCl 20 mEq / L 125 mL/hr at 08/05/19 1310     LOS: 1 day   Time spent: 25 mins   Damyan Corne Laural Benes, MD How to contact the [facility] Attending or Consulting provider 7A - 7P or covering provider during after hours 7P -7A, for this patient?  1. Check the care team in [facility] and look for a) attending/consulting [facility] provider listed and b) the [facility] team listed 2. Log into [email or web address] and use [place]'s universal password to access. If you do not have the password, please contact the hospital operator. 3. Locate the [facility] provider you are looking for under [facility] and page to a number that you can be directly reached. 4. If you still have difficulty reaching the provider, please page the [facility] (Director on Call) for the Hospitalists listed on amion for assistance.  08/05/2019, 2:04 PM

## 2019-08-06 LAB — GLUCOSE, CAPILLARY
Glucose-Capillary: 353 mg/dL — ABNORMAL HIGH (ref 70–99)
Glucose-Capillary: 398 mg/dL — ABNORMAL HIGH (ref 70–99)
Glucose-Capillary: 287 mg/dL — ABNORMAL HIGH (ref 70–99)
Glucose-Capillary: 334 mg/dL — ABNORMAL HIGH (ref 70–99)
Glucose-Capillary: 356 mg/dL — ABNORMAL HIGH (ref 70–99)

## 2019-08-06 LAB — BASIC METABOLIC PANEL
Anion gap: 15 (ref 5–15)
BUN: 25 mg/dL — ABNORMAL HIGH (ref 6–20)
CO2: 21 mmol/L — ABNORMAL LOW (ref 22–32)
Calcium: 8.8 mg/dL — ABNORMAL LOW (ref 8.9–10.3)
Chloride: 97 mmol/L — ABNORMAL LOW (ref 98–111)
Creatinine, Ser: 0.8 mg/dL (ref 0.61–1.24)
GFR calc Af Amer: >60 mL/min (ref >60)
GFR calc non Af Amer: >60 mL/min (ref >60)
Glucose, Bld: 391 mg/dL — ABNORMAL HIGH (ref 70–99)
Potassium: 4.3 mmol/L (ref 3.5–5.1)
Sodium: 133 mmol/L — ABNORMAL LOW (ref 135–145)

## 2019-08-06 MED ORDER — HYDROCOD POLST-CPM POLST ER 10-8 MG/5ML PO SUER
5.0000 mL | Freq: Two times a day (BID) | ORAL | Status: DC | PRN
  Administered 2019-08-06 – 2019-08-10 (×4): 5 mL via ORAL
  Filled 2019-08-06 (×4): qty 5

## 2019-08-06 MED ORDER — INSULIN GLARGINE 100 UNIT/ML ~~LOC~~ SOLN
50.0000 [IU] | Freq: Every day | SUBCUTANEOUS | Status: DC
  Administered 2019-08-06: 50 [IU] via SUBCUTANEOUS
  Filled 2019-08-06 (×3): qty 0.5

## 2019-08-06 MED ORDER — FUROSEMIDE 10 MG/ML IJ SOLN
20.0000 mg | Freq: Every day | INTRAMUSCULAR | Status: AC
  Administered 2019-08-06 – 2019-08-08 (×3): 20 mg via INTRAVENOUS
  Filled 2019-08-06 (×3): qty 2

## 2019-08-06 MED ORDER — ALBUTEROL SULFATE (2.5 MG/3ML) 0.083% IN NEBU
INHALATION_SOLUTION | RESPIRATORY_TRACT | Status: AC
  Administered 2019-08-06: 2.5 mg
  Filled 2019-08-06: qty 3

## 2019-08-06 MED ORDER — INSULIN GLARGINE 100 UNIT/ML ~~LOC~~ SOLN
40.0000 [IU] | Freq: Every day | SUBCUTANEOUS | Status: DC
  Filled 2019-08-06 (×3): qty 0.4

## 2019-08-06 MED ORDER — INSULIN ASPART 100 UNIT/ML ~~LOC~~ SOLN
14.0000 [IU] | Freq: Three times a day (TID) | SUBCUTANEOUS | Status: DC
  Administered 2019-08-06 (×3): 14 [IU] via SUBCUTANEOUS

## 2019-08-06 NOTE — Progress Notes (Signed)
Patient called about 06:10 , patient had gotten up to bathroom turned around started coughing , became short of breath received albuterol nebulizer.

## 2019-08-06 NOTE — Progress Notes (Addendum)
PROGRESS NOTE   Robert Rich  VQX:[id number] DOB: 22-Mar-1965 DOA: 08/04/2019 PCP: Rebecka Apley, NP   Chief Complaint  Patient presents with  . Shortness of Breath    Brief Admission History:  54 y.o. male former smoker with medical history significant for severe COPD, coronary artery disease, type 2 diabetes mellitus, dyslipidemia, alcohol use disorder and substance abuse with benzodiazepines and history of prior cocaine use was recently hospitalized discharged 2 weeks ago for a COPD exacerbation.  He reports that he felt well after going home but when he ran out of steroids and antibiotics his symptoms slowly began to return over the course of the last 3 days.  He became so severely short of breath today that he called EMS.  He says that he had return to work last week and was working short hours up until 3 days ago when he had increased his work hours and noticed that his shortness of breath became acutely worse.  He reports significant wheezing and increased work of breathing coughing and chest discomfort.  He denies chest pain.  He reports productive cough with yellow sputum.  He can only walk a few feet without severe shortness of breath.  Assessment & Plan:   Principal Problem:   COPD with acute exacerbation (HCC) Active Problems:   Tobacco abuse   Class 1 obesity due to excess calories with serious comorbidity and body mass index (BMI) of 32.0 to 32.9 in adult   HTN (hypertension)   Anxiety   Substance abuse (HCC)   Coronary artery disease   HLD (hyperlipidemia)   Leukocytosis   Hyperglycemia   Steroid-induced hyperglycemia  1. Acute on chronic respiratory failure with hypoxia - secondary to COPD exacerbation - symptoms rapidly returned after he completed his steroid taper and antibiotics from recent hospitalization.  He continues to struggle to breathe.  He has an outpatient pulmonary appt later this month but I requested inpatient consult given multiple admissions and  ED visits.  I spoke with Dr. Craige Cotta and consulted.  2. COPD severe - Appreciate pulmonary consult.  Unfortunately patient cannot tolerate brovana neb treatments.  Continue Duonebs, continue pulmicort nebs and IV steroids and levofloxacin.  Pulmonary willl see him again on Monday.  Added lasix 20 mg IV daily x 3.   3. Leukocytosis - steroid induced - following.  4. CAD - stable, no chest pain symptoms, resumed his home heart medications.  5. Tobacco - He reports he quit several months ago.  6. Polysubstance abuse - UDS positive for benzo, opioid and barbituates. 7. GAD - alprazolam as needed only for severe symptoms.  8. Type 2 DM with steroid induced hyperglycemia - increased doses of insulin and follow closely especially while on IV steroids.  Uptitrated insulin doses 7/10.    DVT prophylaxis: enoxaparin Code Status:  Full  Family Communication: spouse was facetime during rounds  Disposition:   Status is: Inpatient  Remains inpatient appropriate because:IV treatments appropriate due to intensity of illness or inability to take PO and Inpatient level of care appropriate due to severity of illness  Dispo:  Patient From: Home  Planned Disposition: Home  Expected discharge date: 08/08/19  Medically stable for discharge: No  Consultants:  PCCM pulmonology   Procedures:      Antimicrobials:  levofloxacin  Subjective:  Pt says he couldn't tolerate the brovana nebs, he became very dizzy and sick to his stomach, won't take anymore, slight improvement in respiratory but no where near his baseline  Objective:  Vitals:   08/06/19 0337 08/06/19 0424 08/06/19 0609 08/06/19 0730  BP:  140/87    Pulse:  90    Resp:  16    Temp:  98.2 F (36.8 C)    TempSrc:  Oral    SpO2: 96% 96% 98% 96%  Weight:      Height:        Intake/Output Summary (Last 24 hours) at 08/06/2019 1153 Last data filed at 08/06/2019 0958 Gross per 24 hour  Intake 720 ml  Output 450 ml  Net 270 ml   Filed  Weights   08/04/19 1113  Weight: 101.1 kg   Examination:  General exam:  moderate increased WOB. Audible wheezing heard without stethoscope.   Respiratory system: diffuse exp wheezing, minimally improved,  poor air movement. tachypnea. Cardiovascular system: normal S1 & S2 heard. No JVD, murmurs, rubs, gallops or clicks. No pedal edema. Gastrointestinal system: Abdomen is nondistended, soft and nontender. No organomegaly or masses felt. Normal bowel sounds heard. Central nervous system: Alert and oriented. No focal neurological deficits. Extremities: Symmetric 5 x 5 power. Skin: No rashes, lesions or ulcers Psychiatry: Judgement and insight appear normal. Mood & affect appropriate.   Data Reviewed: I have personally reviewed following labs and imaging studies  CBC: Recent Labs  Lab 08/04/19 1145 08/05/19 0632  WBC 12.6* 12.6*  NEUTROABS  --  11.0*  HGB 13.1 13.1  HCT 40.8 40.5  MCV 89.1 88.4  PLT 341 379    Basic Metabolic Panel: Recent Labs  Lab 08/04/19 1145 08/05/19 0632 08/06/19 0623  NA 137 133* 133*  K 4.1 4.1 4.3  CL 101 100 97*  CO2 24 22 21*  GLUCOSE 229* 341* 391*  BUN 12 16 25*  CREATININE 0.59* 0.55* 0.80  CALCIUM 8.6* 8.7* 8.8*  MG 2.3 2.3  --     GFR: Estimated Creatinine Clearance: 125.2 mL/min (by C-G formula based on SCr of 0.8 mg/dL).  Liver Function Tests: Recent Labs  Lab 08/05/19 0632  AST 14*  ALT 23  ALKPHOS 56  BILITOT 0.3  PROT 7.1  ALBUMIN 3.8    CBG: Recent Labs  Lab 08/05/19 1714 08/05/19 2130 08/06/19 0330 08/06/19 0746 08/06/19 1131  GLUCAP 330* 276* 353* 398* 287*    Recent Results (from the past 240 hour(s))  SARS Coronavirus 2 by RT PCR (hospital order, performed in [facility] hospital lab) Nasopharyngeal Nasopharyngeal Swab     Status: None   Collection Time: 08/04/19  5:58 PM   Specimen: Nasopharyngeal Swab  Result Value Ref Range Status   SARS Coronavirus 2 NEGATIVE NEGATIVE Final    Comment:  (NOTE) SARS-CoV-2 target nucleic acids are NOT DETECTED.  The SARS-CoV-2 RNA is generally detectable in upper and lower respiratory specimens during the acute phase of infection. The lowest concentration of SARS-CoV-2 viral copies this assay can detect is 250 copies / mL. A negative result does not preclude SARS-CoV-2 infection and should not be used as the sole basis for treatment or other patient management decisions.  A negative result may occur with improper specimen collection / handling, submission of specimen other than nasopharyngeal swab, presence of viral mutation(s) within the areas targeted by this assay, and inadequate number of viral copies (<250 copies / mL). A negative result must be combined with clinical observations, patient history, and epidemiological information.  Fact Sheet for Patients:   BoilerBrush.com.cy  Fact Sheet for Healthcare Providers: https://pope.com/  This test is not yet approved or  cleared by the  [place] [employer] and has been authorized for detection and/or diagnosis of SARS-CoV-2 by FDA under an TEFL teacher (EUA).  This EUA will remain in effect (meaning this test can be used) for the duration of the COVID-19 declaration under Section 564(b)(1) of the Act, 21 U.S.C. section 360bbb-3(b)(1), unless the authorization is terminated or revoked sooner.  Performed at [facility], [address], [place], [place] [address]      Radiology Studies: No results found. Scheduled Meds: . atorvastatin  40 mg Oral q1800  . budesonide (PULMICORT) nebulizer solution  0.5 mg Nebulization BID  . cholecalciferol  5,000 Units Oral Daily  . clopidogrel  75 mg Oral Daily  . enoxaparin (LOVENOX) injection  40 mg Subcutaneous Q24H  . folic acid  1 mg Oral Daily  . furosemide  20 mg Intravenous Daily  . guaiFENesin  1,200 mg Oral BID  . insulin aspart  0-20 Units Subcutaneous TID WC  . insulin  aspart  0-5 Units Subcutaneous QHS  . insulin aspart  14 Units Subcutaneous TID WC  . insulin glargine  40 Units Subcutaneous Daily  . ipratropium-albuterol  3 mL Nebulization Q4H  . levofloxacin  750 mg Oral q1800  . losartan  25 mg Oral Daily  . methylPREDNISolone (SOLU-MEDROL) injection  60 mg Intravenous Q6H  . metoprolol succinate  25 mg Oral Daily  . montelukast  10 mg Oral QHS  . multivitamin with minerals  1 tablet Oral Daily  . pantoprazole  40 mg Oral Daily  . thiamine  250 mg Oral Daily  . Vitamin D (Ergocalciferol)  50,000 Units Oral Q7 days   Continuous Infusions:    LOS: 2 days   Time spent: 22 mins   Laylee Schooley Laural Benes, MD How to contact the [facility] Attending or Consulting provider 7A - 7P or covering provider during after hours 7P -7A, for this patient?  1. Check the care team in [facility] and look for a) attending/consulting TRH provider listed and b) the [facility] team listed 2. Log into [email or web address] and use [facility]'s universal password to access. If you do not have the password, please contact the hospital operator. 3. Locate the [facility] provider you are looking for under [facility] and page to a number that you can be directly reached. 4. If you still have difficulty reaching the provider, please page the [facility] (Director on Call) for the Hospitalists listed on amion for assistance.  08/06/2019, 11:53 AM

## 2019-08-07 LAB — GLUCOSE, CAPILLARY
Glucose-Capillary: 421 mg/dL — ABNORMAL HIGH (ref 70–99)
Glucose-Capillary: 311 mg/dL — ABNORMAL HIGH (ref 70–99)
Glucose-Capillary: 468 mg/dL — ABNORMAL HIGH (ref 70–99)
Glucose-Capillary: 272 mg/dL — ABNORMAL HIGH (ref 70–99)
Glucose-Capillary: 166 mg/dL — ABNORMAL HIGH (ref 70–99)
Glucose-Capillary: 309 mg/dL — ABNORMAL HIGH (ref 70–99)

## 2019-08-07 LAB — BASIC METABOLIC PANEL
Anion gap: 13 (ref 5–15)
BUN: 20 mg/dL (ref 6–20)
CO2: 28 mmol/L (ref 22–32)
Calcium: 9 mg/dL (ref 8.9–10.3)
Chloride: 94 mmol/L — ABNORMAL LOW (ref 98–111)
Creatinine, Ser: 0.68 mg/dL (ref 0.61–1.24)
GFR calc Af Amer: >60 mL/min (ref >60)
GFR calc non Af Amer: >60 mL/min (ref >60)
Glucose, Bld: 326 mg/dL — ABNORMAL HIGH (ref 70–99)
Potassium: 4.2 mmol/L (ref 3.5–5.1)
Sodium: 135 mmol/L (ref 135–145)

## 2019-08-07 LAB — MAGNESIUM: Magnesium: 2.3 mg/dL (ref 1.7–2.4)

## 2019-08-07 LAB — CBC
HCT: 45.2 % (ref 39.0–52.0)
Hemoglobin: 14.3 g/dL (ref 13.0–17.0)
MCH: 27.9 pg (ref 26.0–34.0)
MCHC: 31.6 g/dL (ref 30.0–36.0)
MCV: 88.1 fL (ref 80.0–100.0)
Platelets: 400 10*3/uL (ref 150–400)
RBC: 5.13 MIL/uL (ref 4.22–5.81)
RDW: 13.9 % (ref 11.5–15.5)
WBC: 12.7 10*3/uL — ABNORMAL HIGH (ref 4.0–10.5)
nRBC: 0 % (ref 0.0–0.2)

## 2019-08-07 MED ORDER — INSULIN ASPART 100 UNIT/ML ~~LOC~~ SOLN
25.0000 [IU] | Freq: Three times a day (TID) | SUBCUTANEOUS | Status: DC
  Administered 2019-08-07 – 2019-08-09 (×7): 25 [IU] via SUBCUTANEOUS

## 2019-08-07 MED ORDER — INSULIN ASPART 100 UNIT/ML ~~LOC~~ SOLN
20.0000 [IU] | Freq: Three times a day (TID) | SUBCUTANEOUS | Status: DC
  Administered 2019-08-07: 20 [IU] via SUBCUTANEOUS

## 2019-08-07 MED ORDER — INSULIN ASPART 100 UNIT/ML ~~LOC~~ SOLN: 25.0000 [IU] | Freq: Three times a day (TID) | SUBCUTANEOUS | Status: DC

## 2019-08-07 MED ORDER — INSULIN GLARGINE 100 UNIT/ML ~~LOC~~ SOLN
75.0000 [IU] | Freq: Every day | SUBCUTANEOUS | Status: DC
  Administered 2019-08-07 – 2019-08-08 (×2): 75 [IU] via SUBCUTANEOUS
  Filled 2019-08-07 (×5): qty 0.75

## 2019-08-07 MED ORDER — INSULIN GLARGINE 100 UNIT/ML ~~LOC~~ SOLN
60.0000 [IU] | Freq: Every day | SUBCUTANEOUS | Status: DC
  Filled 2019-08-07 (×2): qty 0.6

## 2019-08-07 MED ORDER — INSULIN GLARGINE 100 UNIT/ML ~~LOC~~ SOLN
70.0000 [IU] | Freq: Every day | SUBCUTANEOUS | Status: DC
  Filled 2019-08-07 (×2): qty 0.7

## 2019-08-07 NOTE — Progress Notes (Signed)
Patient allowed to sleep at midnight for neb.

## 2019-08-07 NOTE — Progress Notes (Signed)
Patient's sugar 468, MD informed and orders given

## 2019-08-07 NOTE — Progress Notes (Signed)
PROGRESS NOTE   Robert Rich  GYI:[id number] DOB: [birth year] DOA: 08/04/2019 PCP: Rebecka Apley, NP   Chief Complaint  Patient presents with  . Shortness of Breath    Brief Admission History:  54 y.o. male former smoker with medical history significant for severe COPD, coronary artery disease, type 2 diabetes mellitus, dyslipidemia, alcohol use disorder and substance abuse with benzodiazepines and history of prior cocaine use was recently hospitalized discharged 2 weeks ago for a COPD exacerbation.  He reports that he felt well after going home but when he ran out of steroids and antibiotics his symptoms slowly began to return over the course of the last 3 days.  He became so severely short of breath today that he called EMS.  He says that he had return to work last week and was working short hours up until 3 days ago when he had increased his work hours and noticed that his shortness of breath became acutely worse.  He reports significant wheezing and increased work of breathing coughing and chest discomfort.  He denies chest pain.  He reports productive cough with yellow sputum.  He can only walk a few feet without severe shortness of breath.  Assessment & Plan:   Principal Problem:   COPD with acute exacerbation (HCC) Active Problems:   Tobacco abuse   Class 1 obesity due to excess calories with serious comorbidity and body mass index (BMI) of 32.0 to 32.9 in adult   HTN (hypertension)   Anxiety   Substance abuse (HCC)   Coronary artery disease   HLD (hyperlipidemia)   Leukocytosis   Hyperglycemia   Steroid-induced hyperglycemia  1. Acute on chronic respiratory failure with hypoxia - secondary to severe COPD exacerbation - symptoms rapidly returned after he completed his steroid taper and antibiotics from recent hospitalization.  He continues to struggle to breathe but slowly improving.  He has an outpatient pulmonary appt later this month but I requested inpatient consult  given multiple admissions and ED visits.  I spoke with Dr. Craige Cotta and consulted.  2. COPD severe - Appreciate pulmonary consult.  Unfortunately patient cannot tolerate brovana neb treatments.  He does tolerate the other nebs.  Continue Duonebs, continue pulmicort nebs and IV steroids and levofloxacin.  Pulmonary willl see him again on Monday.  Added lasix 20 mg IV daily x 3.   3. Leukocytosis - steroid induced - stable.   4. CAD - stable, no chest pain symptoms, resumed his home heart medications.  5. Tobacco - He reports he quit several months ago.  6. Polysubstance abuse - UDS positive for benzo, opioid and barbituates. 7. GAD - alprazolam as needed only for severe symptoms.  8. Type 2 DM with steroid induced hyperglycemia - increased doses of insulin and follow closely especially while on IV steroids.  Uptitrated insulin doses 7/10.   DVT prophylaxis: enoxaparin Code Status:  Full  Family Communication: spouse was facetime during rounds 7/10 Disposition:   Status is: Inpatient  Remains inpatient appropriate because:IV treatments appropriate due to intensity of illness or inability to take PO and Inpatient level of care appropriate due to severity of illness  Dispo:  Patient From: Home  Planned Disposition: Home  Expected discharge date: 08/08/19  Medically stable for discharge: No  Consultants:  PCCM pulmonology   Procedures:      Antimicrobials:  levofloxacin  Subjective:  Pt has been trying to ambulate but he still gets severely SOB after a few steps.  He feels better now  that we have stopped the brovana.  He doesn't want to use that anymore.  He tolerates the other nebs ok.  Objective: Vitals:   08/07/19 0428 08/07/19 0601 08/07/19 0727 08/07/19 0733  BP:  124/84    Pulse:  73    Resp:  16    Temp:  98.1 F (36.7 C)    TempSrc:  Oral    SpO2: 96% 96% 96% 96%  Weight:      Height:        Intake/Output Summary (Last 24 hours) at 08/07/2019 1111 Last data filed at  08/07/2019 0930 Gross per 24 hour  Intake 1440 ml  Output --  Net 1440 ml   Filed Weights   08/04/19 1113  Weight: 101.1 kg   Examination:  General exam:  moderate increased WOB. Sitting up in chair, no apparent distress, speaking in short sentences.   Respiratory system: diffuse exp wheezing, slightly improved,  poor air movement. Tachypnea unchanged. Cardiovascular system: normal S1 & S2 heard. No JVD, murmurs, rubs, gallops or clicks. No pedal edema. Gastrointestinal system: Abdomen is nondistended, soft and nontender. No organomegaly or masses felt. Normal bowel sounds heard. Central nervous system: Alert and oriented. No focal neurological deficits. Extremities: Symmetric 5 x 5 power. Skin: No rashes, lesions or ulcers Psychiatry: Judgement and insight appear normal. Mood & affect appropriate.   Data Reviewed: I have personally reviewed following labs and imaging studies  CBC: Recent Labs  Lab 08/04/19 1145 08/05/19 0632 08/07/19 0634  WBC 12.6* 12.6* 12.7*  NEUTROABS  --  11.0*  --   HGB 13.1 13.1 14.3  HCT 40.8 40.5 45.2  MCV 89.1 88.4 88.1  PLT 341 379 400    Basic Metabolic Panel: Recent Labs  Lab 08/04/19 1145 08/05/19 0632 08/06/19 0623 08/07/19 0634  NA 137 133* 133* 135  K 4.1 4.1 4.3 4.2  CL 101 100 97* 94*  CO2 24 22 21* 28  GLUCOSE 229* 341* 391* 326*  BUN 12 16 25* 20  CREATININE 0.59* 0.55* 0.80 0.68  CALCIUM 8.6* 8.7* 8.8* 9.0  MG 2.3 2.3  --  2.3    GFR: Estimated Creatinine Clearance: 125.2 mL/min (by C-G formula based on SCr of 0.68 mg/dL).  Liver Function Tests: Recent Labs  Lab 08/05/19 0632  AST 14*  ALT 23  ALKPHOS 56  BILITOT 0.3  PROT 7.1  ALBUMIN 3.8    CBG: Recent Labs  Lab 08/06/19 1131 08/06/19 1627 08/06/19 2119 08/07/19 0322 08/07/19 0747  GLUCAP 287* 334* 356* 421* 311*    Recent Results (from the past 240 hour(s))  SARS Coronavirus 2 by RT PCR (hospital order, performed in [facility] hospital lab)  Nasopharyngeal Nasopharyngeal Swab     Status: None   Collection Time: 08/04/19  5:58 PM   Specimen: Nasopharyngeal Swab  Result Value Ref Range Status   SARS Coronavirus 2 NEGATIVE NEGATIVE Final    Comment: (NOTE) SARS-CoV-2 target nucleic acids are NOT DETECTED.  The SARS-CoV-2 RNA is generally detectable in upper and lower respiratory specimens during the acute phase of infection. The lowest concentration of SARS-CoV-2 viral copies this assay can detect is 250 copies / mL. A negative result does not preclude SARS-CoV-2 infection and should not be used as the sole basis for treatment or other patient management decisions.  A negative result may occur with improper specimen collection / handling, submission of specimen other than nasopharyngeal swab, presence of viral mutation(s) within the areas targeted by this assay, and  inadequate number of viral copies (<250 copies / mL). A negative result must be combined with clinical observations, patient history, and epidemiological information.  Fact Sheet for Patients:   BoilerBrush.com.cy  Fact Sheet for Healthcare Providers: https://pope.com/  This test is not yet approved or  cleared by the [place] FDA and has been authorized for detection and/or diagnosis of SARS-CoV-2 by FDA under an Emergency Use Authorization (EUA).  This EUA will remain in effect (meaning this test can be used) for the duration of the COVID-19 declaration under Section 564(b)(1) of the Act, 21 U.S.C. section 360bbb-3(b)(1), unless the authorization is terminated or revoked sooner.  Performed at [facility], [address], [place], [place] [address]      Radiology Studies: No results found. Scheduled Meds: . atorvastatin  40 mg Oral q1800  . budesonide (PULMICORT) nebulizer solution  0.5 mg Nebulization BID  . cholecalciferol  5,000 Units Oral Daily  . clopidogrel  75 mg Oral Daily  . enoxaparin  (LOVENOX) injection  40 mg Subcutaneous Q24H  . folic acid  1 mg Oral Daily  . furosemide  20 mg Intravenous Daily  . guaiFENesin  1,200 mg Oral BID  . insulin aspart  0-20 Units Subcutaneous TID WC  . insulin aspart  0-5 Units Subcutaneous QHS  . insulin aspart  20 Units Subcutaneous TID WC  . insulin glargine  60 Units Subcutaneous Daily  . ipratropium-albuterol  3 mL Nebulization Q4H  . levofloxacin  750 mg Oral q1800  . losartan  25 mg Oral Daily  . methylPREDNISolone (SOLU-MEDROL) injection  60 mg Intravenous Q6H  . metoprolol succinate  25 mg Oral Daily  . montelukast  10 mg Oral QHS  . multivitamin with minerals  1 tablet Oral Daily  . pantoprazole  40 mg Oral Daily  . thiamine  250 mg Oral Daily  . Vitamin D (Ergocalciferol)  50,000 Units Oral Q7 days   Continuous Infusions:   LOS: 3 days   Time spent: 18 mins   Christipher Rieger Laural Benes, MD How to contact the [facility] Attending or Consulting provider 7A - 7P or covering provider during after hours 7P -7A, for this patient?  1. Check the care team in [facility] and look for a) attending/consulting TRH provider listed and b) the [facility] team listed 2. Log into [email or web address] and use [place]'s universal password to access. If you do not have the password, please contact the hospital operator. 3. Locate the [facility] provider you are looking for under [facility] and page to a number that you can be directly reached. 4. If you still have difficulty reaching the provider, please page the [facility] (Director on Call) for the Hospitalists listed on amion for assistance.  08/07/2019, 11:11 AM

## 2019-08-08 LAB — GLUCOSE, CAPILLARY
Glucose-Capillary: 228 mg/dL — ABNORMAL HIGH (ref 70–99)
Glucose-Capillary: 300 mg/dL — ABNORMAL HIGH (ref 70–99)
Glucose-Capillary: 368 mg/dL — ABNORMAL HIGH (ref 70–99)
Glucose-Capillary: 426 mg/dL — ABNORMAL HIGH (ref 70–99)
Glucose-Capillary: 215 mg/dL — ABNORMAL HIGH (ref 70–99)
Glucose-Capillary: 189 mg/dL — ABNORMAL HIGH (ref 70–99)

## 2019-08-08 LAB — BASIC METABOLIC PANEL
Anion gap: 11 (ref 5–15)
BUN: 29 mg/dL — ABNORMAL HIGH (ref 6–20)
CO2: 25 mmol/L (ref 22–32)
Calcium: 8.5 mg/dL — ABNORMAL LOW (ref 8.9–10.3)
Chloride: 94 mmol/L — ABNORMAL LOW (ref 98–111)
Creatinine, Ser: 0.69 mg/dL (ref 0.61–1.24)
GFR calc Af Amer: >60 mL/min (ref >60)
GFR calc non Af Amer: >60 mL/min (ref >60)
Glucose, Bld: 328 mg/dL — ABNORMAL HIGH (ref 70–99)
Potassium: 4.5 mmol/L (ref 3.5–5.1)
Sodium: 130 mmol/L — ABNORMAL LOW (ref 135–145)

## 2019-08-08 LAB — MAGNESIUM: Magnesium: 2.1 mg/dL (ref 1.7–2.4)

## 2019-08-08 MED ORDER — METHYLPREDNISOLONE SODIUM SUCC 40 MG IJ SOLR
40.0000 mg | Freq: Three times a day (TID) | INTRAMUSCULAR | Status: DC
  Administered 2019-08-08 – 2019-08-09 (×2): 40 mg via INTRAVENOUS
  Filled 2019-08-08 (×2): qty 1

## 2019-08-08 MED ORDER — METHYLPREDNISOLONE SODIUM SUCC 40 MG IJ SOLR
40.0000 mg | Freq: Four times a day (QID) | INTRAMUSCULAR | Status: DC
  Administered 2019-08-08 (×2): 40 mg via INTRAVENOUS
  Filled 2019-08-08 (×2): qty 1

## 2019-08-08 NOTE — Progress Notes (Signed)
   NAME:  Robert Rich, MRN:  [id number], DOB:  [birth year], LOS: 4 ADMISSION DATE:  08/04/2019, CONSULTATION DATE:  08/05/2019 REFERRING MD:  Dr. Laural Benes, CHIEF COMPLAINT:  Short of breath   Brief History   54 yo male former smoker with hx of cocaine abuse presented to ER on 08/04/19 with worsening dyspnea, wheezing, and chest discomfort.  Symptoms started after running out of steroids and antibiotics after admission for COPD exacerbation from 07/18/19 to 07/22/19.  Admitted for COPD exacerbation and PCCM asked to assist with respiratory management.    Past Medical History  CAD, DM, HLD, ETOH, Benzodiazepine abuse, Cocaine abuse, Rt cerebellar CVA, HTN, COVID 19 infection November 2020, Influenza February 2020, PEA cardiac arrest February 2021 from COPD exacerbation  Significant Hospital Events   7/08 Admit  Consults:    Procedures:    Significant Diagnostic Tests:  LHC 03/18/19 >> 50% stenosis Ost LAD Echo 03/18/19 >> EF 55 to 60% CT chest 03/28/19 >> multiple nodules up to 4 mm 6/21 AEC 1400  Micro Data:  COVID 7/08 >> negative  Antimicrobials:  Levaquin 7/08 >>   Interim history/subjective:   States 50% improved Wheezing today, complains of dry cough Was able to walk to the nursing station and back  Objective   Blood pressure (!) 143/95, pulse 72, temperature 97.9 F (36.6 C), temperature source Oral, resp. rate 18, height 5\' 9"  (1.753 m), weight 101.1 kg, SpO2 97 %.        Intake/Output Summary (Last 24 hours) at 08/08/2019 1306 Last data filed at 08/08/2019 1221 Gross per 24 hour  Intake 1440 ml  Output --  Net 1440 ml   Filed Weights   08/04/19 1113  Weight: 101.1 kg    Examination:  General - alert , middle-age man, no distress, sitting up in bed HEENT- no sinus tenderness, no stridor Cardiac - regular rate/rhythm, no murmur Chest - b/l expiratory wheezing Abdomen - soft, non tender, + bowel sounds Extremities - no cyanosis, clubbing, or edema Skin - no  rashes Neuro - normal strength, moves extremities, follows commands Psych - normal affect Lymphatics - no lymphadenopathy  Resolved Hospital Problem list     Assessment & Plan:   Acute hypoxic respiratory failure from COPD exacerbation. - might have component of asthma also - Change to pulmicort, brovana  - added singulair - scheduled duoneb - flutter valve, mucinex -Decrease Solu-Medrol 40 every 8 especially in view of hyperglycemia - levaquin x 5ds -Can resume anoro as outpt >> likely will need ICS in additional to LAMA/LABA as outpt -PFTs eventually once acute exacerbation resolved  CAD, HTN, HLD. - toprol, plavix, lipitor, cozaar -Continue beta-blockade for now, may have to reconsider if bronchospasm does not resolve  DM type II with steroid induced hyperglycemia. - SSI   10/05/19 MD. FCCP. [facility]  If no response to pager , please call [phone]   08/08/2019

## 2019-08-08 NOTE — Progress Notes (Signed)
PROGRESS NOTE   Robert Rich  EXH:[id number] DOB: 24-Jun-1965 DOA: 08/04/2019 PCP: Rebecka Apley, NP   Chief Complaint  Patient presents with  . Shortness of Breath    Brief Admission History:  54 y.o. male former smoker with medical history significant for severe COPD, coronary artery disease, type 2 diabetes mellitus, dyslipidemia, alcohol use disorder and substance abuse with benzodiazepines and history of prior cocaine use was recently hospitalized discharged 2 weeks ago for a COPD exacerbation.  He reports that he felt well after going home but when he ran out of steroids and antibiotics his symptoms slowly began to return over the course of the last 3 days.  He became so severely short of breath today that he called EMS.  He says that he had return to work last week and was working short hours up until 3 days ago when he had increased his work hours and noticed that his shortness of breath became acutely worse.  He reports significant wheezing and increased work of breathing coughing and chest discomfort.  He denies chest pain.  He reports productive cough with yellow sputum.  He can only walk a few feet without severe shortness of breath.  Assessment & Plan:   Principal Problem:   COPD with acute exacerbation (HCC) Active Problems:   Tobacco abuse   Class 1 obesity due to excess calories with serious comorbidity and body mass index (BMI) of 32.0 to 32.9 in adult   HTN (hypertension)   Anxiety   Substance abuse (HCC)   Coronary artery disease   HLD (hyperlipidemia)   Leukocytosis   Hyperglycemia   Steroid-induced hyperglycemia  1. Acute on chronic respiratory failure with hypoxia - secondary to severe COPD exacerbation - symptoms rapidly returned after he completed his steroid taper and antibiotics from recent hospitalization.  He continues to struggle to breathe but slowly improving.  He has an outpatient pulmonary appt later this month but I requested inpatient consult  given multiple admissions and ED visits.  I spoke with Dr. Craige Cotta and consulted.  Pulmonary saw him today and said he needs 1 more inpatient day and could discharge home tomorrow.  I spoke with Dr. [name].  2. COPD severe - Appreciate pulmonary consult.  Unfortunately patient could not tolerate brovana neb treatments.  He does tolerate the other nebs.  Continue Duonebs, continue pulmicort nebs and IV steroids and levofloxacin.  Pulmonary willl see him again on Monday.  He was given lasix 20 mg IV daily x 3 total doses.   3. Leukocytosis - steroid induced - stable.   4. CAD - stable, no chest pain symptoms, resumed his home heart medications.  5. Tobacco - He reports he quit several months ago.  6. Polysubstance abuse - UDS positive for benzo, opioid and barbituates. 7. GAD - alprazolam as needed only for severe symptoms.  8. Type 2 DM with steroid induced hyperglycemia - increased doses of insulin and follow closely especially while on IV steroids.  Uptitrated insulin doses 7/10, 7/11, I reduced steroid dose 7/12.   DVT prophylaxis: enoxaparin Code Status:  Full  Family Communication: spouse was facetime during rounds 7/10 Disposition:   Status is: Inpatient  Remains inpatient appropriate because:IV treatments appropriate due to intensity of illness or inability to take PO and Inpatient level of care appropriate due to severity of illness  Dispo:  Patient From: Home  Planned Disposition: Home  Expected discharge date: 08/09/19  Medically stable for discharge: No  Consultants:  PCCM pulmonology  Procedures:      Antimicrobials:  levofloxacin  Subjective:  Pt reports ongoing shortness of breath and wheezing especially with ambulation but he has been working to increase his distance of walking.    Objective: Vitals:   08/08/19 0552 08/08/19 0738 08/08/19 0742 08/08/19 1112  BP: (!) 143/95     Pulse: 72     Resp: 18     Temp: 97.9 F (36.6 C)     TempSrc: Oral     SpO2: 96% 97%  97% 97%  Weight:      Height:        Intake/Output Summary (Last 24 hours) at 08/08/2019 1253 Last data filed at 08/08/2019 1221 Gross per 24 hour  Intake 1440 ml  Output --  Net 1440 ml   Filed Weights   08/04/19 1113  Weight: 101.1 kg   Examination:  General exam:  moderate increased WOB. Sitting up in chair, no apparent distress, speaking in short sentences.   Respiratory system: diffuse exp wheezing, improved,  poor air movement. No tachypnea.  Cardiovascular system: normal S1 & S2 heard. No JVD, murmurs, rubs, gallops or clicks. No pedal edema. Gastrointestinal system: Abdomen is nondistended, soft and nontender. No organomegaly or masses felt. Normal bowel sounds heard. Central nervous system: Alert and oriented. No focal neurological deficits. Extremities: Symmetric 5 x 5 power. Skin: No rashes, lesions or ulcers Psychiatry: Judgement and insight appear normal. Mood & affect appropriate.   Data Reviewed: I have personally reviewed following labs and imaging studies  CBC: Recent Labs  Lab 08/04/19 1145 08/05/19 0632 08/07/19 0634  WBC 12.6* 12.6* 12.7*  NEUTROABS  --  11.0*  --   HGB 13.1 13.1 14.3  HCT 40.8 40.5 45.2  MCV 89.1 88.4 88.1  PLT 341 379 400    Basic Metabolic Panel: Recent Labs  Lab 08/04/19 1145 08/05/19 0632 08/06/19 0623 08/07/19 0634 08/08/19 0610  NA 137 133* 133* 135 130*  K 4.1 4.1 4.3 4.2 4.5  CL 101 100 97* 94* 94*  CO2 24 22 21* 28 25  GLUCOSE 229* 341* 391* 326* 328*  BUN 12 16 25* 20 29*  CREATININE 0.59* 0.55* 0.80 0.68 0.69  CALCIUM 8.6* 8.7* 8.8* 9.0 8.5*  MG 2.3 2.3  --  2.3 2.1    GFR: Estimated Creatinine Clearance: 125.2 mL/min (by C-G formula based on SCr of 0.69 mg/dL).  Liver Function Tests: Recent Labs  Lab 08/05/19 0632  AST 14*  ALT 23  ALKPHOS 56  BILITOT 0.3  PROT 7.1  ALBUMIN 3.8    CBG: Recent Labs  Lab 08/07/19 2200 08/08/19 0326 08/08/19 0730 08/08/19 1030 08/08/19 1113  GLUCAP 309*  228* 300* 426* 368*    Recent Results (from the past 240 hour(s))  SARS Coronavirus 2 by RT PCR (hospital order, performed in [facility] hospital lab) Nasopharyngeal Nasopharyngeal Swab     Status: None   Collection Time: 08/04/19  5:58 PM   Specimen: Nasopharyngeal Swab  Result Value Ref Range Status   SARS Coronavirus 2 NEGATIVE NEGATIVE Final    Comment: (NOTE) SARS-CoV-2 target nucleic acids are NOT DETECTED.  The SARS-CoV-2 RNA is generally detectable in upper and lower respiratory specimens during the acute phase of infection. The lowest concentration of SARS-CoV-2 viral copies this assay can detect is 250 copies / mL. A negative result does not preclude SARS-CoV-2 infection and should not be used as the sole basis for treatment or other patient management decisions.  A negative result  may occur with improper specimen collection / handling, submission of specimen other than nasopharyngeal swab, presence of viral mutation(s) within the areas targeted by this assay, and inadequate number of viral copies (<250 copies / mL). A negative result must be combined with clinical observations, patient history, and epidemiological information.  Fact Sheet for Patients:   BoilerBrush.com.cy  Fact Sheet for Healthcare Providers: https://pope.com/  This test is not yet approved or  cleared by the [place] FDA and has been authorized for detection and/or diagnosis of SARS-CoV-2 by FDA under an Emergency Use Authorization (EUA).  This EUA will remain in effect (meaning this test can be used) for the duration of the COVID-19 declaration under Section 564(b)(1) of the Act, 21 U.S.C. section 360bbb-3(b)(1), unless the authorization is terminated or revoked sooner.  Performed at [facility], [address] [place], [place] [address]      Radiology Studies: No results found. Scheduled Meds: . atorvastatin  40 mg Oral q1800  .  budesonide (PULMICORT) nebulizer solution  0.5 mg Nebulization BID  . cholecalciferol  5,000 Units Oral Daily  . clopidogrel  75 mg Oral Daily  . enoxaparin (LOVENOX) injection  40 mg Subcutaneous Q24H  . folic acid  1 mg Oral Daily  . guaiFENesin  1,200 mg Oral BID  . insulin aspart  0-20 Units Subcutaneous TID WC  . insulin aspart  0-5 Units Subcutaneous QHS  . insulin aspart  25 Units Subcutaneous TID WC  . insulin glargine  75 Units Subcutaneous Daily  . ipratropium-albuterol  3 mL Nebulization Q4H  . levofloxacin  750 mg Oral q1800  . losartan  25 mg Oral Daily  . methylPREDNISolone (SOLU-MEDROL) injection  40 mg Intravenous Q6H  . metoprolol succinate  25 mg Oral Daily  . montelukast  10 mg Oral QHS  . multivitamin with minerals  1 tablet Oral Daily  . pantoprazole  40 mg Oral Daily  . thiamine  250 mg Oral Daily  . Vitamin D (Ergocalciferol)  50,000 Units Oral Q7 days   Continuous Infusions:   LOS: 4 days   Time spent: 22 mins   Eddison Searls Laural Benes, MD How to contact the [facility] Attending or Consulting provider 7A - 7P or covering provider during after hours 7P -7A, for this patient?  1. Check the care team in [facility] and look for a) attending/consulting TRH provider listed and b) the [facility] team listed 2. Log into [email or web address] and use 's universal password to access. If you do not have the password, please contact the hospital operator. 3. Locate the [facility] provider you are looking for under [facility] and page to a number that you can be directly reached. 4. If you still have difficulty reaching the provider, please page the [facility] (Director on Call) for the Hospitalists listed on amion for assistance.  08/08/2019, 12:53 PM

## 2019-08-09 DIAGNOSIS — J441 Chronic obstructive pulmonary disease with (acute) exacerbation: Secondary | ICD-10-CM

## 2019-08-09 LAB — GLUCOSE, CAPILLARY
Glucose-Capillary: 177 mg/dL — ABNORMAL HIGH (ref 70–99)
Glucose-Capillary: 311 mg/dL — ABNORMAL HIGH (ref 70–99)
Glucose-Capillary: 349 mg/dL — ABNORMAL HIGH (ref 70–99)
Glucose-Capillary: 66 mg/dL — ABNORMAL LOW (ref 70–99)
Glucose-Capillary: 71 mg/dL (ref 70–99)
Glucose-Capillary: 332 mg/dL — ABNORMAL HIGH (ref 70–99)
Glucose-Capillary: 314 mg/dL — ABNORMAL HIGH (ref 70–99)
Glucose-Capillary: 350 mg/dL — ABNORMAL HIGH (ref 70–99)

## 2019-08-09 MED ORDER — INSULIN GLARGINE 100 UNIT/ML ~~LOC~~ SOLN
30.0000 [IU] | Freq: Every day | SUBCUTANEOUS | Status: DC
  Filled 2019-08-09 (×2): qty 0.3

## 2019-08-09 MED ORDER — INSULIN ASPART 100 UNIT/ML ~~LOC~~ SOLN
10.0000 [IU] | Freq: Three times a day (TID) | SUBCUTANEOUS | Status: DC
  Administered 2019-08-10 – 2019-08-11 (×6): 10 [IU] via SUBCUTANEOUS

## 2019-08-09 MED ORDER — INSULIN STARTER KIT- SYRINGES (ENGLISH)
1.0000 | Freq: Once | Status: AC
  Administered 2019-08-09: 1
  Filled 2019-08-09: qty 1

## 2019-08-09 MED ORDER — INSULIN ASPART 100 UNIT/ML ~~LOC~~ SOLN: 20.0000 [IU] | Freq: Three times a day (TID) | SUBCUTANEOUS | Status: DC

## 2019-08-09 MED ORDER — FUROSEMIDE 10 MG/ML IJ SOLN
20.0000 mg | Freq: Once | INTRAMUSCULAR | Status: AC
  Administered 2019-08-09: 20 mg via INTRAVENOUS
  Filled 2019-08-09: qty 2

## 2019-08-09 MED ORDER — INSULIN GLARGINE 100 UNIT/ML ~~LOC~~ SOLN
60.0000 [IU] | Freq: Every day | SUBCUTANEOUS | Status: DC
  Filled 2019-08-09: qty 0.6

## 2019-08-09 MED ORDER — METHYLPREDNISOLONE SODIUM SUCC 40 MG IJ SOLR
40.0000 mg | Freq: Two times a day (BID) | INTRAMUSCULAR | Status: DC
  Administered 2019-08-09 – 2019-08-10 (×2): 40 mg via INTRAVENOUS
  Filled 2019-08-09 (×2): qty 1

## 2019-08-09 NOTE — Progress Notes (Signed)
   NAME:  Robert Rich, MRN:  [id number], DOB:  [birth year], LOS: 5 ADMISSION DATE:  08/04/2019, CONSULTATION DATE:  08/05/2019 REFERRING MD:  Dr. Laural Benes, CHIEF COMPLAINT:  Short of breath   Brief History   54 yo male former smoker with hx of cocaine abuse presented to ER on 08/04/19 with worsening dyspnea, wheezing, and chest discomfort.  Symptoms started after running out of steroids and antibiotics after admission for COPD exacerbation from 07/18/19 to 07/22/19.  Admitted for COPD exacerbation and PCCM asked to assist with respiratory management.    Past Medical History  CAD, DM, HLD, ETOH, Benzodiazepine abuse, Cocaine abuse, Rt cerebellar CVA, HTN, COVID 19 infection November 2020, Influenza February 2020, PEA cardiac arrest February 2021 from COPD exacerbation  Significant Hospital Events   7/08 Admit  Consults:    Procedures:    Significant Diagnostic Tests:  LHC 03/18/19 >> 50% stenosis Ost LAD Echo 03/18/19 >> EF 55 to 60% CT chest 03/28/19 >> multiple nodules up to 4 mm 6/21 AEC 1400  Micro Data:  COVID 7/08 >> negative  Antimicrobials:  Levaquin 7/08 >>   Interim history/subjective:  Breathing continues to improve Able to ambulate Still wheezing Sugars high  Objective   Blood pressure 126/79, pulse 74, temperature 98.6 F (37 C), temperature source Oral, resp. rate 20, height 5\' 9"  (1.753 m), weight 101.1 kg, SpO2 97 %.        Intake/Output Summary (Last 24 hours) at 08/09/2019 1259 Last data filed at 08/09/2019 0900 Gross per 24 hour  Intake 240 ml  Output --  Net 240 ml   Filed Weights   08/04/19 1113  Weight: 101.1 kg    Examination:  General - alert , middle-age man, no distress, sitting up in a chair having breakfast HEENT- no sinus tenderness, no stridor Cardiac - regular rate/rhythm, no murmur Chest - b/l expiratory wheezing , decreased from 7/12 Abdomen - soft, non tender, + bowel sounds Extremities - no cyanosis, clubbing, or edema Skin - no  rashes Neuro -alert, interactive Psych - normal affect Lymphatics - no lymphadenopathy  Resolved Hospital Problem list     Assessment & Plan:   Acute hypoxic respiratory failure from COPD exacerbation. - might have component of asthma also - added singulair - scheduled duoneb - flutter valve, mucinex -Decrease Solu-Medrol 40 every 12 especially in view of hyperglycemia, adding tomorrow change to prednisone 40 mg and gradual taper over 2 weeks - levaquin x 5ds -Can resume anoro as outpt >> likely will need ICS in additional to LAMA/LABA as outpt -PFTs eventually once acute exacerbation resolved  CAD, HTN, HLD. - toprol, plavix, lipitor, cozaar -Continue beta-blockade for now, may have to reconsider if bronchospasm does not resolve  DM type II with steroid induced hyperglycemia. - SSI -May need better control on his outpatient regimen while he is on prednisone will need clarification on discharge.  Outpatient follow-up has been arranged. PCCM available as needed   9/12 MD. FCCP. [facility]  If no response to pager , please call [phone]   08/09/2019

## 2019-08-09 NOTE — Progress Notes (Signed)
PROGRESS NOTE   Robert Rich  NGE:[id number] DOB: 1965-08-08 DOA: 08/04/2019 PCP: Bridget Hartshorn, NP   Chief Complaint  Patient presents with  . Shortness of Breath    Brief Admission History:  54 y.o. male former smoker with medical history significant for severe COPD, coronary artery disease, type 2 diabetes mellitus, dyslipidemia, alcohol use disorder and substance abuse with benzodiazepines and history of prior cocaine use was recently hospitalized discharged 2 weeks ago for a COPD exacerbation.  He reports that he felt well after going home but when he ran out of steroids and antibiotics his symptoms slowly began to return over the course of the last 3 days.  He became so severely short of breath today that he called EMS.  He says that he had return to work last week and was working short hours up until 3 days ago when he had increased his work hours and noticed that his shortness of breath became acutely worse.  He reports significant wheezing and increased work of breathing coughing and chest discomfort.  He denies chest pain.  He reports productive cough with yellow sputum.  He can only walk a few feet without severe shortness of breath.  Assessment & Plan:   Principal Problem:   COPD with acute exacerbation ([place]) Active Problems:   Tobacco abuse   Class 1 obesity due to excess calories with serious comorbidity and body mass index (BMI) of 32.0 to 32.9 in adult   HTN (hypertension)   Anxiety   Substance abuse (HCC)   Coronary artery disease   HLD (hyperlipidemia)   Leukocytosis   Hyperglycemia   Steroid-induced hyperglycemia  1. Acute on chronic respiratory failure with hypoxia - secondary to severe COPD exacerbation - symptoms rapidly returned after he completed his steroid taper and antibiotics from recent hospitalization.  He continues to struggle to breathe but slowly improving.  He has an outpatient pulmonary appt later this month but I requested inpatient consult  given multiple admissions and ED visits.  I spoke with Dr. Halford Chessman and consulted.  He is not quite medically ready today as he rapidly decompensates with ambulation.  Continue inpatient management for at least 24 more hours.    2. COPD severe - Appreciate pulmonary consult.  Unfortunately patient could not tolerate brovana neb treatments.  He does tolerate the other nebs.  Continue Duonebs, continue pulmicort nebs and IV steroids and levofloxacin.  Pulmonary willl see him again on outpatient follow up 08/24/19.  They will arrange PFTs.  He was given lasix 20 mg IV daily x 4 total doses.   3. Leukocytosis - steroid induced - stable.   4. CAD - stable, no chest pain symptoms, resumed his home heart medications.  5. Tobacco - He reports he quit several months ago.  6. Polysubstance abuse - UDS positive for benzo, opioid and barbituates. 7. GAD - alprazolam as needed only for severe symptoms.  8. Type 2 DM with steroid induced hyperglycemia - increased doses of insulin and follow closely especially while on IV steroids.  Uptitrated insulin doses 7/10, 7/11, steroids reduced 7/12 and 7/13.  Pt will need to DC home on insulin.  He has used insulin before.  He prefers vials and syringes.  He will be on steroids for at least 2 weeks after discharge and will need insulin plus metformin to help control his steroid induced hyperglycemia.  See DM coordinator recommendations.   DVT prophylaxis: enoxaparin Code Status:  Full  Family Communication: spouse was facetime during  rounds 7/10 Disposition:   Status is: Inpatient  Remains inpatient appropriate because:IV treatments appropriate due to intensity of illness or inability to take PO and Inpatient level of care appropriate due to severity of illness  Dispo:  Patient From: Home  Planned Disposition: Home  Expected discharge date: 08/10/19  Medically stable for discharge: No  Consultants:  PCCM pulmonology   Procedures:      Antimicrobials:    Levofloxacin   Subjective:  Pt afraid that if he goes home today it will be too soon, he is still wheezing quite a lot and SOB with ambulation.  He asks if he can receive inpatient care for additional 24 hours.    Objective: Vitals:   08/09/19 0725 08/09/19 0730 08/09/19 1124 08/09/19 1521  BP:      Pulse:      Resp:      Temp:      TempSrc:      SpO2: 97% 97% 97% 97%  Weight:      Height:        Intake/Output Summary (Last 24 hours) at 08/09/2019 1544 Last data filed at 08/09/2019 0900 Gross per 24 hour  Intake 240 ml  Output --  Net 240 ml   Filed Weights   08/04/19 1113  Weight: 101.1 kg   Examination:  General exam:  moderate increased WOB slightly improved but not at baseline per patient. Sitting up in chair, no apparent distress, speaking better today.    Respiratory system: diffuse exp wheezing heard bilaterally, slightly improved, poor air movement. No tachypnea.  Cardiovascular system: normal S1 & S2 heard. No JVD, murmurs, rubs, gallops or clicks. No pedal edema. Gastrointestinal system: Abdomen is nondistended, soft and nontender. No organomegaly or masses felt. Normal bowel sounds heard. Central nervous system: Alert and oriented. No focal neurological deficits. Extremities: Symmetric 5 x 5 power. Skin: No rashes, lesions or ulcers Psychiatry: Judgement and insight appear normal. Mood & affect appropriate.   Data Reviewed: I have personally reviewed following labs and imaging studies  CBC: Recent Labs  Lab 08/04/19 1145 08/05/19 0632 08/07/19 0634  WBC 12.6* 12.6* 12.7*  NEUTROABS  --  11.0*  --   HGB 13.1 13.1 14.3  HCT 40.8 40.5 45.2  MCV 89.1 88.4 88.1  PLT 341 379 389    Basic Metabolic Panel: Recent Labs  Lab 08/04/19 1145 08/05/19 0632 08/06/19 0623 08/07/19 0634 08/08/19 0610  NA 137 133* 133* 135 130*  K 4.1 4.1 4.3 4.2 4.5  CL 101 100 97* 94* 94*  CO2 24 22 21* 28 25  GLUCOSE 229* 341* 391* 326* 328*  BUN 12 16 25* 20 29*   CREATININE 0.59* 0.55* 0.80 0.68 0.69  CALCIUM 8.6* 8.7* 8.8* 9.0 8.5*  MG 2.3 2.3  --  2.3 2.1    GFR: Estimated Creatinine Clearance: 125.2 mL/min (by C-G formula based on SCr of 0.69 mg/dL).  Liver Function Tests: Recent Labs  Lab 08/05/19 0632  AST 14*  ALT 23  ALKPHOS 56  BILITOT 0.3  PROT 7.1  ALBUMIN 3.8    CBG: Recent Labs  Lab 08/08/19 1620 08/08/19 2036 08/09/19 0321 08/09/19 0740 08/09/19 1110  GLUCAP 215* 189* 177* 311* 349*    Recent Results (from the past 240 hour(s))  SARS Coronavirus 2 by RT PCR (hospital order, performed in [facility] hospital lab) Nasopharyngeal Nasopharyngeal Swab     Status: None   Collection Time: 08/04/19  5:58 PM   Specimen: Nasopharyngeal Swab  Result  Value Ref Range Status   SARS Coronavirus 2 NEGATIVE NEGATIVE Final    Comment: (NOTE) SARS-CoV-2 target nucleic acids are NOT DETECTED.  The SARS-CoV-2 RNA is generally detectable in upper and lower respiratory specimens during the acute phase of infection. The lowest concentration of SARS-CoV-2 viral copies this assay can detect is 250 copies / mL. A negative result does not preclude SARS-CoV-2 infection and should not be used as the sole basis for treatment or other patient management decisions.  A negative result may occur with improper specimen collection / handling, submission of specimen other than nasopharyngeal swab, presence of viral mutation(s) within the areas targeted by this assay, and inadequate number of viral copies (<250 copies / mL). A negative result must be combined with clinical observations, patient history, and epidemiological information.  Fact Sheet for Patients:   StrictlyIdeas.no  Fact Sheet for Healthcare Providers: BankingDealers.co.za  This test is not yet approved or  cleared by the [place] FDA and has been authorized for detection and/or diagnosis of SARS-CoV-2 by FDA under an  Emergency Use Authorization (EUA).  This EUA will remain in effect (meaning this test can be used) for the duration of the COVID-19 declaration under Section 564(b)(1) of the Act, 21 U.S.C. section 360bbb-3(b)(1), unless the authorization is terminated or revoked sooner.  Performed at [facility], [address] [place], [place] [address]      Radiology Studies: No results found. Scheduled Meds: . atorvastatin  40 mg Oral q1800  . budesonide (PULMICORT) nebulizer solution  0.5 mg Nebulization BID  . cholecalciferol  5,000 Units Oral Daily  . clopidogrel  75 mg Oral Daily  . enoxaparin (LOVENOX) injection  40 mg Subcutaneous Q24H  . folic acid  1 mg Oral Daily  . guaiFENesin  1,200 mg Oral BID  . insulin aspart  0-20 Units Subcutaneous TID WC  . insulin aspart  0-5 Units Subcutaneous QHS  . insulin aspart  25 Units Subcutaneous TID WC  . insulin glargine  75 Units Subcutaneous Daily  . insulin starter kit- syringes  1 kit Other Once  . ipratropium-albuterol  3 mL Nebulization Q4H  . levofloxacin  750 mg Oral q1800  . losartan  25 mg Oral Daily  . methylPREDNISolone (SOLU-MEDROL) injection  40 mg Intravenous Q12H  . metoprolol succinate  25 mg Oral Daily  . montelukast  10 mg Oral QHS  . multivitamin with minerals  1 tablet Oral Daily  . pantoprazole  40 mg Oral Daily  . thiamine  250 mg Oral Daily  . Vitamin D (Ergocalciferol)  50,000 Units Oral Q7 days   Continuous Infusions:   LOS: 5 days   Time spent: 20 mins   Kalyn Dimattia Wynetta Emery, MD How to contact the [facility] Attending or Consulting provider [place] or covering provider during after hours [place], for this patient?  1. Check the care team in [facility] and look for a) attending/consulting TRH provider listed and b) the [facility] team listed 2. Log into [email or web address] and use [place]'s universal password to access. If you do not have the password, please contact the hospital operator. 3. Locate the [facility] provider you are looking for  under [facility] and page to a number that you can be directly reached. 4. If you still have difficulty reaching the provider, please page the [facility] (Director on Call) for the Hospitalists listed on amion for assistance.  08/09/2019, 3:44 PM

## 2019-08-09 NOTE — Progress Notes (Signed)
Inpatient Diabetes Program Recommendations  AACE/ADA: New Consensus Statement on Inpatient Glycemic Control  Target Ranges:  Prepandial:   less than 140 mg/dL      Peak postprandial:   less than 180 mg/dL (1-2 hours)      Critically ill patients:  140 - 180 mg/dL   Results for KINDRICK, LANKFORD (MRN [id number]) as of 08/09/2019 14:12  Ref. Range 08/08/2019 07:30 08/08/2019 10:30 08/08/2019 11:13 08/08/2019 16:20 08/08/2019 20:36 08/09/2019 03:21 08/09/2019 07:40 08/09/2019 11:10  Glucose-Capillary Latest Ref Range: 70 - 99 mg/dL 119 (H) 147 (H) 829 (H) 215 (H) 189 (H) 177 (H) 311 (H) 349 (H)  Results for ZAE, KIRTZ (MRN [id number]) as of 08/09/2019 14:12  Ref. Range 03/18/2019 00:06 07/18/2019 11:40  Hemoglobin A1C Latest Ref Range: 4.8 - 5.6 % 7.3 (H) 9.4 (H)   Review of Glycemic Control  Diabetes history: DM2 Outpatient Diabetes medications: Metformin 1000 mg BID Current orders for Inpatient glycemic control: Lantus 75 units daily, Novolog 25 units TID with meals, Novolog 0-20 units TID with meals, Novolog 0-5 units QHS  Inpatient Diabetes Program Recommendations:    HbgA1C:  A1C 9.4% on 06/28/19 indicating an average glucose of 223 mg/dl over the past 2-3 months. Patient notes that he has been on insulin in the past but was able to come off insulin when he lost 85 pounds and changed his diet. Patient also notes that he had taken steroids in mid May 2021 for an upper respiratory infection.  NOTE: Spoke with patient about diabetes and home regimen for diabetes control. Patient reports being followed by PCP for diabetes management.  Patient reports that he was taking Metformin 500 mg BID and it was increased to 1000 mg BID after last hospital discharge in June 2021. Patient also notes that he use to take Lantus and Novolog insulin (vials) but he was able to come off insulin about 1 year ago after he changed his diet and lost 85 pounds.  Patient has a glucometer at home and has been checking glucose once  a day; he notes his glucose has been running 180 mg/dl or less over the past couple of weeks.  Discussed A1C results (9.4% on 07/18/19) and explained that current A1C indicates an average glucose of 223 mg/dl over the past 2-3 months.  Patient states that his prior A1C was in the 6% range. Patient notes that he was on Prednisone for an upper  respiratory infection about 3 weeks prior to last hospital admission. Discussed impact of nutrition, exercise, stress, sickness, and medications on diabetes control. Explained that steroids are contributing to noted hyperglycemia and he is requiring a lot of insulin. 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  Discussed glucose and A1C goals. Discussed importance of checking CBGs and maintaining good CBG control to prevent long-term and short-term complications. Discussed that he will likely need to take insulin at time of discharge especially if he is discharged on steroids. Patient is agreeable to take insulin at home and he states he prefers to use vial/syringe. Patient notes that he is recently got insurance coverage which is suppose to be effective 08/09/19. Encouraged patient to call his wife and ask to send him a copy of insurance card so our [facility] team can run a benefit check to see which insulins are covered with insurance. Also discussed Novolin insulins at [facility] (NPH, Regular, 70/30) which are $25 per vial in case he needs a  more affordable insulin. Also discussed savings cards for insulin which can be found online.   Encouraged patient to check glucose as directed by MD and to keep a log book of glucose readings and DM medication taken which patient will need to take to doctor appointments. Explained how the doctor can use the log book to continue to make adjustments with DM medications if needed.  Encouraged patient to reach out to his PCP if he experiences any issues with hypoglycemia so insulin  adjustments can be made.  Patient verbalized understanding of information discussed and reports no further questions at this time related to diabetes.  Thanks, Orlando Penner, RN, MSN, CDE Diabetes Coordinator Inpatient Diabetes Program [phone] (Team Pager)

## 2019-08-09 NOTE — Progress Notes (Signed)
Pt stated he feels fatigue after ambulating in the hall. Pt stated he may have done too much and just has a funny feeling

## 2019-08-10 LAB — GLUCOSE, CAPILLARY
Glucose-Capillary: 209 mg/dL — ABNORMAL HIGH (ref 70–99)
Glucose-Capillary: 260 mg/dL — ABNORMAL HIGH (ref 70–99)
Glucose-Capillary: 206 mg/dL — ABNORMAL HIGH (ref 70–99)
Glucose-Capillary: 340 mg/dL — ABNORMAL HIGH (ref 70–99)
Glucose-Capillary: 226 mg/dL — ABNORMAL HIGH (ref 70–99)
Glucose-Capillary: 146 mg/dL — ABNORMAL HIGH (ref 70–99)

## 2019-08-10 MED ORDER — PREDNISONE 20 MG PO TABS
40.0000 mg | ORAL_TABLET | Freq: Two times a day (BID) | ORAL | Status: DC
  Administered 2019-08-10: 40 mg via ORAL
  Filled 2019-08-10 (×2): qty 2

## 2019-08-10 MED ORDER — INSULIN GLARGINE 100 UNIT/ML ~~LOC~~ SOLN
60.0000 [IU] | Freq: Every day | SUBCUTANEOUS | Status: DC
  Filled 2019-08-10 (×2): qty 0.6

## 2019-08-10 MED ORDER — INSULIN GLARGINE 100 UNIT/ML ~~LOC~~ SOLN: 20.0000 [IU] | Freq: Once | SUBCUTANEOUS | Status: DC

## 2019-08-10 MED ORDER — PREDNISONE 20 MG PO TABS: 40.0000 mg | ORAL_TABLET | Freq: Every day | ORAL | Status: DC

## 2019-08-10 MED ORDER — INSULIN GLARGINE 100 UNIT/ML ~~LOC~~ SOLN
30.0000 [IU] | Freq: Every day | SUBCUTANEOUS | Status: DC
  Administered 2019-08-10: 30 [IU] via SUBCUTANEOUS
  Filled 2019-08-10 (×2): qty 0.3

## 2019-08-10 MED ORDER — INSULIN GLARGINE 100 UNIT/ML ~~LOC~~ SOLN
30.0000 [IU] | Freq: Once | SUBCUTANEOUS | Status: AC
  Administered 2019-08-10: 30 [IU] via SUBCUTANEOUS
  Filled 2019-08-10: qty 0.3

## 2019-08-10 NOTE — Progress Notes (Signed)
PROGRESS NOTE  Robert Rich IRJ:[id number] DOB: [birth year] DOA: 08/04/2019 PCP: Rebecka Apley, NP  Brief History:  54 y.o.maleformer smokerwith medical history significantforsevere COPD, coronary artery disease, type 2 diabetes mellitus, dyslipidemia, alcohol use disorder and substance abuse with benzodiazepines and history of prior cocaine use was recently hospitalized discharged 2 weeks ago for a COPD exacerbation. He reports that he felt well after going home but when he ran out of steroids and antibiotics his symptoms slowly began to return over the course of the last 3 days. He became so severely short of breath today that he called EMS. He says that he had return to work last week and was working short hours up until 3 days ago when he had increased his work hours and noticed that his shortness of breath became acutely worse. He reports significant wheezing and increased work of breathing coughing and chest discomfort. He denies chest pain. He reports productive cough with yellow sputum. He can only walk a few feet without severe shortness of breath.   Assessment/Plan: Acute on chronic respiratory failure with hypoxia  -secondary to severe COPD exacerbation  --symptoms rapidly returned after he completed his steroid taper and antibiotics from recent hospitalization. -He has an outpatient pulmonary appt later this month but I requested inpatient consult given multiple admissions and ED visits.  -appreciate pulmonary follow up COPD Exacerbation -continue duonebs -de-escalate to po prednisone  - Appreciate pulmonary consult.  Discussed with Dr. Halina Maidens prednisone over next 2 weeks   -Unfortunately patient could not tolerate brovana neb treatments.   -Continue Duonebs, continue pulmicort nebs  -Pulmonary willl see him again on outpatient follow up 08/24/19.   -They will arrange PFTs.  - He was given lasix 20 mg IV daily x 4 total doses.    CAD  - stable,  no chest pain symptoms, resumed his home heart medications.  Tobacco  - He reports he quit several months ago.  Polysubstance abuse  - UDS positive for benzo, opioid and barbituates. Anxiety - alprazolam as needed only for severe symptoms.  Type 2 DM with steroid induced hyperglycemia  - increased doses of insulin and follow closely especially while on IV steroids.    -Pt will need to DC home on insulin.  He has used insulin before.  He prefers vials and syringes.  He will be on steroids for at least 2 weeks after discharge and will need insulin plus metformin to help control his steroid induced hyperglycemia     Status is: Inpatient  Remains inpatient appropriate because:IV treatments appropriate due to intensity of illness or inability to take PO   Dispo:  Patient From: Home  Planned Disposition: Home  Expected discharge date: 08/11/19  Medically stable for discharge: No         Family Communication:  no Family at bedside  Consultants:  pulm  Code Status:  FULL  DVT Prophylaxis:   [place] Lovenox   Procedures: As Listed in Progress Note Above  Antibiotics: None       Subjective: Pt complains of sob with exertion.  Denies cp, n/v/d, abd pain. No f/c  Objective: Vitals:   08/10/19 0500 08/10/19 0827 08/10/19 0830 08/10/19 1144  BP: 135/89     Pulse: 72     Resp: 20     Temp: 98.1 F (36.7 C)     TempSrc: Oral     SpO2: 97% 96% 100% 93%  Weight:  Height:        Intake/Output Summary (Last 24 hours) at 08/10/2019 1515 Last data filed at 08/09/2019 1744 Gross per 24 hour  Intake 240 ml  Output --  Net 240 ml   Weight change:  Exam:   General:  Pt is alert, follows commands appropriately, not in acute distress  HEENT: No icterus, No thrush, No neck mass, [place]/AT  Cardiovascular: RRR, S1/S2, no rubs, no gallops  Respiratory: bibasilar rales. No wheeze  Abdomen: Soft/+BS, non tender, non distended, no guarding  Extremities: No edema, No  lymphangitis, No petechiae, No rashes, no synovitis   Data Reviewed: I have personally reviewed following labs and imaging studies Basic Metabolic Panel: Recent Labs  Lab 08/04/19 1145 08/05/19 0632 08/06/19 0623 08/07/19 0634 08/08/19 0610  NA 137 133* 133* 135 130*  K 4.1 4.1 4.3 4.2 4.5  CL 101 100 97* 94* 94*  CO2 24 22 21* 28 25  GLUCOSE 229* 341* 391* 326* 328*  BUN 12 16 25* 20 29*  CREATININE 0.59* 0.55* 0.80 0.68 0.69  CALCIUM 8.6* 8.7* 8.8* 9.0 8.5*  MG 2.3 2.3  --  2.3 2.1   Liver Function Tests: Recent Labs  Lab 08/05/19 0632  AST 14*  ALT 23  ALKPHOS 56  BILITOT 0.3  PROT 7.1  ALBUMIN 3.8   No results for input(s): LIPASE, AMYLASE in the last 168 hours. No results for input(s): AMMONIA in the last 168 hours. Coagulation Profile: No results for input(s): INR, PROTIME in the last 168 hours. CBC: Recent Labs  Lab 08/04/19 1145 08/05/19 0632 08/07/19 0634  WBC 12.6* 12.6* 12.7*  NEUTROABS  --  11.0*  --   HGB 13.1 13.1 14.3  HCT 40.8 40.5 45.2  MCV 89.1 88.4 88.1  PLT 341 379 400   Cardiac Enzymes: No results for input(s): CKTOTAL, CKMB, CKMBINDEX, TROPONINI in the last 168 hours. BNP: Invalid input(s): POCBNP CBG: Recent Labs  Lab 08/09/19 2125 08/10/19 0255 08/10/19 0458 08/10/19 0754 08/10/19 1140  GLUCAP 350* 209* 260* 206* 340*   HbA1C: No results for input(s): HGBA1C in the last 72 hours. Urine analysis:    Component Value Date/Time   COLORURINE YELLOW 10/09/2017 1819   APPEARANCEUR CLEAR 10/09/2017 1819   LABSPEC 1.021 10/09/2017 1819   PHURINE 5.0 10/09/2017 1819   GLUCOSEU NEGATIVE 10/09/2017 1819   HGBUR NEGATIVE 10/09/2017 1819   BILIRUBINUR NEGATIVE 10/09/2017 1819   KETONESUR NEGATIVE 10/09/2017 1819   PROTEINUR NEGATIVE 10/09/2017 1819   NITRITE NEGATIVE 10/09/2017 1819   LEUKOCYTESUR NEGATIVE 10/09/2017 1819   Sepsis Labs: @LABRCNTIP (procalcitonin:4,lacticidven:4) ) Recent Results (from the past 240 hour(s))    SARS Coronavirus 2 by RT PCR (hospital order, performed in [facility] hospital lab) Nasopharyngeal Nasopharyngeal Swab     Status: None   Collection Time: 08/04/19  5:58 PM   Specimen: Nasopharyngeal Swab  Result Value Ref Range Status   SARS Coronavirus 2 NEGATIVE NEGATIVE Final    Comment: (NOTE) SARS-CoV-2 target nucleic acids are NOT DETECTED.  The SARS-CoV-2 RNA is generally detectable in upper and lower respiratory specimens during the acute phase of infection. The lowest concentration of SARS-CoV-2 viral copies this assay can detect is 250 copies / mL. A negative result does not preclude SARS-CoV-2 infection and should not be used as the sole basis for treatment or other patient management decisions.  A negative result may occur with improper specimen collection / handling, submission of specimen other than nasopharyngeal swab, presence of viral mutation(s) within the  areas targeted by this assay, and inadequate number of viral copies (<250 copies / mL). A negative result must be combined with clinical observations, patient history, and epidemiological information.  Fact Sheet for Patients:   BoilerBrush.com.cy  Fact Sheet for Healthcare Providers: https://pope.com/  This test is not yet approved or  cleared by the [place] FDA and has been authorized for detection and/or diagnosis of SARS-CoV-2 by FDA under an Emergency Use Authorization (EUA).  This EUA will remain in effect (meaning this test can be used) for the duration of the COVID-19 declaration under Section 564(b)(1) of the Act, 21 U.S.C. section 360bbb-3(b)(1), unless the authorization is terminated or revoked sooner.  Performed at [facility], [address] [place], [place] [address]      Scheduled Meds: . atorvastatin  40 mg Oral q1800  . budesonide (PULMICORT) nebulizer solution  0.5 mg Nebulization BID  . cholecalciferol  5,000 Units Oral Daily  .  clopidogrel  75 mg Oral Daily  . enoxaparin (LOVENOX) injection  40 mg Subcutaneous Q24H  . folic acid  1 mg Oral Daily  . guaiFENesin  1,200 mg Oral BID  . insulin aspart  0-20 Units Subcutaneous TID WC  . insulin aspart  0-5 Units Subcutaneous QHS  . insulin aspart  10 Units Subcutaneous TID WC  . insulin glargine  30 Units Subcutaneous Once  . [START ON 08/11/2019] insulin glargine  60 Units Subcutaneous Daily  . ipratropium-albuterol  3 mL Nebulization Q4H  . losartan  25 mg Oral Daily  . metoprolol succinate  25 mg Oral Daily  . montelukast  10 mg Oral QHS  . multivitamin with minerals  1 tablet Oral Daily  . pantoprazole  40 mg Oral Daily  . predniSONE  40 mg Oral BID WC  . thiamine  250 mg Oral Daily  . Vitamin D (Ergocalciferol)  50,000 Units Oral Q7 days   Continuous Infusions:  Procedures/Studies: DG Chest Port 1 View  Result Date: 08/04/2019 CLINICAL DATA:  Cough, wheezing, COPD EXAM: PORTABLE CHEST 1 VIEW COMPARISON:  07/18/2019 FINDINGS: The heart size and mediastinal contours are within normal limits. Both lungs are clear. The visualized skeletal structures are unremarkable. IMPRESSION: No active disease. Electronically Signed   By: Helyn Numbers MD   On: 08/04/2019 11:59   DG Chest Portable 1 View  Result Date: 07/18/2019 CLINICAL DATA:  Cough and shortness of breath 3 days. Chest pain. COPD. EXAM: PORTABLE CHEST 1 VIEW COMPARISON:  04/08/2018 FINDINGS: The heart size and mediastinal contours are within normal limits. Both lungs are clear. The visualized skeletal structures are unremarkable. IMPRESSION: No active disease. Electronically Signed   By: Danae Orleans M.D.   On: 07/18/2019 11:55    [name], DO  [facility]  If 7PM-7AM, please contact night-coverage [email or web address] Password [email or web address] 08/10/2019, 3:15 PM   LOS: 6 days

## 2019-08-10 NOTE — TOC Benefit Eligibility Note (Signed)
Transition of Care [facility]) Benefit Eligibility Note    Patient Details  Name: Robert Rich MRN: [id number] Date of Birth: September 26, 1965      Covered?: No        Spoke with Person/Company/Phone Number:: [phone]     Prior Approval: No     Additional Notes: insulin is not a covered medication through [employer], the cost for 1 vial using the patient's prescription assistance program card would cost $316.92 if purchased at his pharmacy, [facility].  [facility] generic Novalin 70/30 is $24.88/vial or $45/5 pens with no insurance    Corey Harold Phone Number: 08/10/2019, 12:55 PM

## 2019-08-10 NOTE — TOC Progression Note (Signed)
Transition of Care [facility]) - Progression Note   Patient Details  Name: Robert Rich MRN: [id number] Date of Birth: [birth year]  Transition of Care [facility]) CM/SW Contact  Ewing Schlein, LCSW Phone Number: 08/10/2019, 1:54 PM  Clinical Narrative: Per benefits check, [facility] generic Novolog 70/30 will be $24.88 per vial. CSW called patient to discuss cost of medication and to explain patient's prescription coverage card does not cover insulin and it covers medications such as antibiotics. Patient asked about [facility] voucher as he was given one last month. CSW explained patient can receive a MATCH voucher once every 12 months and he will not be eligible for another until next June. Patient reported he will follow up with his regular pharmacy to see if he can get his medication there. CSW spoke with patient again and patient requested that his insulin be sent to the [facility], [place] [address] and is requesting a paper prescriptions for his other medications.  Expected Discharge Plan: Home/Self Care Barriers to Discharge: Inadequate or no insurance  Expected Discharge Plan and Services Expected Discharge Plan: Home/Self Care In-house Referral: Clinical Social Work Living arrangements for the past 2 months: Single Family Home  Readmission Risk Interventions Readmission Risk Prevention Plan 08/05/2019  Transportation Screening Complete  Medication Review [facility]) Complete  HRI or Home Care Consult Complete  SW Recovery Care/Counseling Consult Complete  Palliative Care Screening Not Applicable  Skilled Nursing Facility Not Applicable  Some recent data might be hidden

## 2019-08-11 DIAGNOSIS — I10 Essential (primary) hypertension: Secondary | ICD-10-CM

## 2019-08-11 LAB — BASIC METABOLIC PANEL
Anion gap: 8 (ref 5–15)
BUN: 23 mg/dL — ABNORMAL HIGH (ref 6–20)
CO2: 26 mmol/L (ref 22–32)
Calcium: 8.2 mg/dL — ABNORMAL LOW (ref 8.9–10.3)
Chloride: 99 mmol/L (ref 98–111)
Creatinine, Ser: 0.52 mg/dL — ABNORMAL LOW (ref 0.61–1.24)
GFR calc Af Amer: >60 mL/min (ref >60)
GFR calc non Af Amer: >60 mL/min (ref >60)
Glucose, Bld: 231 mg/dL — ABNORMAL HIGH (ref 70–99)
Potassium: 3.8 mmol/L (ref 3.5–5.1)
Sodium: 133 mmol/L — ABNORMAL LOW (ref 135–145)

## 2019-08-11 LAB — GLUCOSE, CAPILLARY
Glucose-Capillary: 296 mg/dL — ABNORMAL HIGH (ref 70–99)
Glucose-Capillary: 197 mg/dL — ABNORMAL HIGH (ref 70–99)
Glucose-Capillary: 171 mg/dL — ABNORMAL HIGH (ref 70–99)
Glucose-Capillary: 285 mg/dL — ABNORMAL HIGH (ref 70–99)

## 2019-08-11 LAB — MAGNESIUM: Magnesium: 2 mg/dL (ref 1.7–2.4)

## 2019-08-11 MED ORDER — PREDNISONE 20 MG PO TABS
60.0000 mg | ORAL_TABLET | Freq: Every day | ORAL | Status: DC
  Administered 2019-08-11: 60 mg via ORAL

## 2019-08-11 MED ORDER — VITAMIN D (ERGOCALCIFEROL) 1.25 MG (50000 UNIT) PO CAPS: 50000.0000 [IU] | ORAL_CAPSULE | ORAL | 1 refills | Status: DC

## 2019-08-11 MED ORDER — NOVOLIN 70/30 (70-30) 100 UNIT/ML ~~LOC~~ SUSP: 50.0000 [IU] | Freq: Two times a day (BID) | SUBCUTANEOUS | 1 refills | Status: DC

## 2019-08-11 MED ORDER — MONTELUKAST SODIUM 10 MG PO TABS: 10.0000 mg | ORAL_TABLET | Freq: Every day | ORAL | 1 refills | Status: DC

## 2019-08-11 MED ORDER — PREDNISONE 10 MG PO TABS: 60.0000 mg | ORAL_TABLET | Freq: Every day | ORAL | 0 refills | Status: DC

## 2019-08-11 NOTE — Plan of Care (Signed)

## 2019-08-11 NOTE — TOC Transition Note (Signed)
Transition of Care [facility]) - CM/SW Discharge Note   Patient Details  Name: Robert Rich MRN: [id number] Date of Birth: 1965-02-14  Transition of Care [facility]) CM/SW Contact:  Elliot Gault, LCSW Phone Number: 08/11/2019, 12:45 PM   Clinical Narrative:     Pt stable for dc today per MD. Discussed dc medications with MD who stated that he will send all prescriptions to [facility] in [facility] and he has discussed this with pt. There are no other TOC needs identified for dc.  Final next level of care: Home/Self Care Barriers to Discharge: Inadequate or no insurance   Patient Goals and CMS Choice Patient states their goals for this hospitalization and ongoing recovery are:: return home      Discharge Placement                       Discharge Plan and Services In-house Referral: Clinical Social Work                                   Social Determinants of Health (SDOH) Interventions     Readmission Risk Interventions Readmission Risk Prevention Plan 08/05/2019  Transportation Screening Complete  Medication Review [employer]) Complete  HRI or Home Care Consult Complete  SW Recovery Care/Counseling Consult Complete  Palliative Care Screening Not Applicable  Skilled Nursing Facility Not Applicable  Some recent data might be hidden

## 2019-08-11 NOTE — Discharge Summary (Signed)
Physician Discharge Summary  Robert Rich:[id number] DOB: 23-Feb-1965 DOA: 08/04/2019  PCP: Rebecka Apley, NP  Admit date: 08/04/2019 Discharge date: 08/11/2019  Admitted From: Home Disposition:  Home   Recommendations for Outpatient Follow-up:  1. Follow up with PCP in 1-2 weeks 2. Please obtain BMP/CBC in one week     Discharge Condition: Stable CODE STATUS: FULL Diet recommendation: Heart Healthy / Carb Modified   Brief/Interim Summary: 54 y.o.maleformer smokerwith medical history significantforsevere COPD, coronary artery disease, type 2 diabetes mellitus, dyslipidemia, alcohol use disorder and substance abuse with benzodiazepines and history of prior cocaine use was recently hospitalized discharged 2 weeks ago for a COPD exacerbation. He reports that he felt well after going home but when he ran out of steroids and antibiotics his symptoms slowly began to return over the course of the last 3 days. He became so severely short of breath today that he called EMS. He says that he had return to work last week and was working short hours up until 3 days ago when he had increased his work hours and noticed that his shortness of breath became acutely worse. He reports significant wheezing and increased work of breathing coughing and chest discomfort. He denies chest pain. He reports productive cough with yellow sputum. He can only walk a few feet without severe shortness of breath.  Discharge Diagnoses:   Acute on chronic respiratory failure with hypoxia  -secondary to severe COPD exacerbation  --symptoms rapidly returned after he completed his steroid taper and antibiotics from recent hospitalization. -He has an outpatient pulmonary appt later this month but I requested inpatient consult given multiple admissions and ED visits.  -appreciate pulmonary follow up-->wean prednisone over 2 weeks  COPD Exacerbation -continue duonebs -de-escalate to po prednisone  -  Appreciate pulmonary consult.  Discussed with Dr. Halina Maidens prednisone over next 2 weeks  -Unfortunately patient could not tolerate brovana neb treatments.  -Continue Duonebs, continue pulmicort nebs  -Pulmonary willl see him again on outpatient follow up 08/24/19.  -They will arrange PFTs.  -He was given lasix 20 mg IV daily x 4total doses during the hospitalization.   CAD  - stable, no chest pain symptoms, resumed his home heart medications.   Tobacco abuse - He reports he quit several months ago.  Polysubstance abuse  - UDS positive for benzo, opioid and barbituates.  Anxiety - alprazolam as needed only for severe symptoms.  Type 2 DM with steroid induced hyperglycemia  - increased doses of insulin and follow closely especially while on IV steroids.  -Pt will need to DC home on insulin. He has used insulin before. He prefers vials and syringes. He will be on steroids for at least 2 weeks after discharge and will need insulin plus metformin to help control his steroid induced hyperglycemia -d/c home with novolin 70/30--50 units bid -instructed pt to continue checking CBGs ac/hs  Discharge Instructions   Allergies as of 08/11/2019      Reactions   Asa [aspirin] Hives   Zolmitriptan Nausea And Vomiting, Other (See Comments)   Severe headaches      Medication List    TAKE these medications   ALPRAZolam 0.5 MG tablet Commonly known as: XANAX Take 1 tablet (0.5 mg total) by mouth every 12 (twelve) hours as needed for anxiety. What changed: how much to take   atorvastatin 40 MG tablet Commonly known as: LIPITOR Take 1 tablet (40 mg total) by mouth daily at 6 PM.   butalbital-acetaminophen-caffeine 50-325-40 MG tablet  Commonly known as: FIORICET Take 1 tablet by mouth every 6 (six) hours as needed for migraine.   clopidogrel 75 MG tablet Commonly known as: PLAVIX Take 1 tablet (75 mg total) by mouth daily.   folic acid 1 MG tablet Commonly known as:  FOLVITE Take 1 tablet (1 mg total) by mouth daily.   HYDROcodone-homatropine 5-1.5 MG/5ML syrup Commonly known as: HYCODAN Take 5 mLs by mouth every 8 (eight) hours as needed (refractory coughing spells).   Ipratropium-Albuterol 20-100 MCG/ACT Aers respimat Commonly known as: COMBIVENT Inhale 1 puff into the lungs every 6 (six) hours as needed for wheezing or shortness of breath.   metFORMIN 1000 MG tablet Commonly known as: Glucophage Take 1 tablet (1,000 mg total) by mouth 2 (two) times daily with a meal.   metoprolol succinate 25 MG 24 hr tablet Commonly known as: TOPROL-XL Take 1 tablet (25 mg total) by mouth daily.   montelukast 10 MG tablet Commonly known as: SINGULAIR Take 1 tablet (10 mg total) by mouth at bedtime.   multivitamin with minerals Tabs tablet Take 1 tablet by mouth daily.   NovoLIN 70/30 (70-30) 100 UNIT/ML injection Generic drug: insulin NPH-regular Human Inject 50 Units into the skin 2 (two) times daily with a meal.   predniSONE 10 MG tablet Commonly known as: DELTASONE Take 6 tablets (60 mg total) by mouth daily with breakfast. And decrease by one tablet every 2 days Start taking on: August 12, 2019 What changed:   medication strength  how much to take  when to take this  additional instructions   thiamine 100 MG tablet Take 2.5 tablets (250 mg total) by mouth daily.   umeclidinium-vilanterol 62.5-25 MCG/INH Aepb Commonly known as: ANORO ELLIPTA Inhale 1 puff into the lungs daily.   Vitamin D (Ergocalciferol) 1.25 MG (50000 UNIT) Caps capsule Commonly known as: DRISDOL Take 1 capsule (50,000 Units total) by mouth every 7 (seven) days. Start taking on: August 12, 2019       Allergies  Allergen Reactions  . Asa [Aspirin] Hives  . Zolmitriptan Nausea And Vomiting and Other (See Comments)    Severe headaches    Consultations:  pulm   Procedures/Studies: DG Chest Port 1 View  Result Date: 08/04/2019 CLINICAL DATA:  Cough,  wheezing, COPD EXAM: PORTABLE CHEST 1 VIEW COMPARISON:  07/18/2019 FINDINGS: The heart size and mediastinal contours are within normal limits. Both lungs are clear. The visualized skeletal structures are unremarkable. IMPRESSION: No active disease. Electronically Signed   By: Helyn Numbers MD   On: 08/04/2019 11:59   DG Chest Portable 1 View  Result Date: 07/18/2019 CLINICAL DATA:  Cough and shortness of breath 3 days. Chest pain. COPD. EXAM: PORTABLE CHEST 1 VIEW COMPARISON:  04/08/2018 FINDINGS: The heart size and mediastinal contours are within normal limits. Both lungs are clear. The visualized skeletal structures are unremarkable. IMPRESSION: No active disease. Electronically Signed   By: Danae Orleans M.D.   On: 07/18/2019 11:55         Discharge Exam: Vitals:   08/11/19 0800 08/11/19 0805  BP:    Pulse:    Resp:    Temp:    SpO2: 97% 97%   Vitals:   08/10/19 2141 08/11/19 0515 08/11/19 0800 08/11/19 0805  BP: (!) 104/59 137/84    Pulse: 76 67    Resp: 20 20    Temp: 99 F (37.2 C) 98.1 F (36.7 C)    TempSrc: Oral Oral    SpO2: 95% 97%  97% 97%  Weight:      Height:        General: Pt is alert, awake, not in acute distress Cardiovascular: RRR, S1/S2 +, no rubs, no gallops Respiratory: mild bibasilar wheeze.  Good air movement Abdominal: Soft, NT, ND, bowel sounds + Extremities: no edema, no cyanosis   The results of significant diagnostics from this hospitalization (including imaging, microbiology, ancillary and laboratory) are listed below for reference.    Significant Diagnostic Studies: DG Chest Port 1 View  Result Date: 08/04/2019 CLINICAL DATA:  Cough, wheezing, COPD EXAM: PORTABLE CHEST 1 VIEW COMPARISON:  07/18/2019 FINDINGS: The heart size and mediastinal contours are within normal limits. Both lungs are clear. The visualized skeletal structures are unremarkable. IMPRESSION: No active disease. Electronically Signed   By: Helyn Numbers MD   On: 08/04/2019  11:59   DG Chest Portable 1 View  Result Date: 07/18/2019 CLINICAL DATA:  Cough and shortness of breath 3 days. Chest pain. COPD. EXAM: PORTABLE CHEST 1 VIEW COMPARISON:  04/08/2018 FINDINGS: The heart size and mediastinal contours are within normal limits. Both lungs are clear. The visualized skeletal structures are unremarkable. IMPRESSION: No active disease. Electronically Signed   By: Danae Orleans M.D.   On: 07/18/2019 11:55     Microbiology: Recent Results (from the past 240 hour(s))  SARS Coronavirus 2 by RT PCR (hospital order, performed in [facility] hospital lab) Nasopharyngeal Nasopharyngeal Swab     Status: None   Collection Time: 08/04/19  5:58 PM   Specimen: Nasopharyngeal Swab  Result Value Ref Range Status   SARS Coronavirus 2 NEGATIVE NEGATIVE Final    Comment: (NOTE) SARS-CoV-2 target nucleic acids are NOT DETECTED.  The SARS-CoV-2 RNA is generally detectable in upper and lower respiratory specimens during the acute phase of infection. The lowest concentration of SARS-CoV-2 viral copies this assay can detect is 250 copies / mL. A negative result does not preclude SARS-CoV-2 infection and should not be used as the sole basis for treatment or other patient management decisions.  A negative result may occur with improper specimen collection / handling, submission of specimen other than nasopharyngeal swab, presence of viral mutation(s) within the areas targeted by this assay, and inadequate number of viral copies (<250 copies / mL). A negative result must be combined with clinical observations, patient history, and epidemiological information.  Fact Sheet for Patients:   BoilerBrush.com.cy  Fact Sheet for Healthcare Providers: https://pope.com/  This test is not yet approved or  cleared by the [place] FDA and has been authorized for detection and/or diagnosis of SARS-CoV-2 by FDA under an Emergency Use  Authorization (EUA).  This EUA will remain in effect (meaning this test can be used) for the duration of the COVID-19 declaration under Section 564(b)(1) of the Act, 21 U.S.C. section 360bbb-3(b)(1), unless the authorization is terminated or revoked sooner.  Performed at [facility], [address], [place], [place] [address]      Labs: Basic Metabolic Panel: Recent Labs  Lab 08/04/19 1145 08/04/19 1145 08/05/19 4193 08/05/19 7902 08/06/19 4097 08/06/19 3532 08/07/19 0634 08/07/19 0634 08/08/19 0610 08/11/19 0626  NA 137   < > 133*  --  133*  --  135  --  130* 133*  K 4.1   < > 4.1   < > 4.3   < > 4.2   < > 4.5 3.8  CL 101   < > 100  --  97*  --  94*  --  94*  99  CO2 24   < > 22  --  21*  --  28  --  25 26  GLUCOSE 229*   < > 341*  --  391*  --  326*  --  328* 231*  BUN 12   < > 16  --  25*  --  20  --  29* 23*  CREATININE 0.59*   < > 0.55*  --  0.80  --  0.68  --  0.69 0.52*  CALCIUM 8.6*   < > 8.7*  --  8.8*  --  9.0  --  8.5* 8.2*  MG 2.3  --  2.3  --   --   --  2.3  --  2.1 2.0   < > = values in this interval not displayed.   Liver Function Tests: Recent Labs  Lab 08/05/19 0632  AST 14*  ALT 23  ALKPHOS 56  BILITOT 0.3  PROT 7.1  ALBUMIN 3.8   No results for input(s): LIPASE, AMYLASE in the last 168 hours. No results for input(s): AMMONIA in the last 168 hours. CBC: Recent Labs  Lab 08/04/19 1145 08/05/19 0632 08/07/19 0634  WBC 12.6* 12.6* 12.7*  NEUTROABS  --  11.0*  --   HGB 13.1 13.1 14.3  HCT 40.8 40.5 45.2  MCV 89.1 88.4 88.1  PLT 341 379 400   Cardiac Enzymes: No results for input(s): CKTOTAL, CKMB, CKMBINDEX, TROPONINI in the last 168 hours. BNP: Invalid input(s): POCBNP CBG: Recent Labs  Lab 08/10/19 1140 08/10/19 1650 08/10/19 2138 08/11/19 0226 08/11/19 0745  GLUCAP 340* 226* 146* 296* 197*    Time coordinating discharge:  36 minutes  Signed:  Catarina Hartshornavid Adalyna Godbee, DO [facility] Pager: [phone] 08/11/2019, 9:11 AM

## 2019-08-24 ENCOUNTER — Other Ambulatory Visit: Payer: Self-pay

## 2019-08-24 ENCOUNTER — Ambulatory Visit (INDEPENDENT_AMBULATORY_CARE_PROVIDER_SITE_OTHER): Payer: Self-pay | Admitting: Internal Medicine

## 2019-08-24 ENCOUNTER — Encounter: Payer: Self-pay | Admitting: Internal Medicine

## 2019-08-24 VITALS — BP 144/82 | HR 72 | Temp 99.2°F | Ht 70.0 in | Wt 241.4 lb

## 2019-08-24 DIAGNOSIS — R918 Other nonspecific abnormal finding of lung field: Secondary | ICD-10-CM

## 2019-08-24 DIAGNOSIS — J441 Chronic obstructive pulmonary disease with (acute) exacerbation: Secondary | ICD-10-CM

## 2019-08-24 DIAGNOSIS — J449 Chronic obstructive pulmonary disease, unspecified: Secondary | ICD-10-CM | POA: Insufficient documentation

## 2019-08-24 MED ORDER — ANORO ELLIPTA 62.5-25 MCG/INH IN AEPB
1.0000 | INHALATION_SPRAY | Freq: Every day | RESPIRATORY_TRACT | 0 refills | Status: DC
Start: 2019-08-24 — End: 2019-08-24

## 2019-08-24 NOTE — Assessment & Plan Note (Addendum)
CT chest 03/28/19 Multiple tiny 2-4 mm pulmonary nodules scattered throughout the lungs bilaterally. No larger more suspicious appearing pulmonary nodules or masses. No acute consolidative airspace disease. No pleural effusions.  Although there are clearly abnormalities on CT scan, they should probably be considered "microscopic" since not obvious on plain cxr .     In the setting of obvious "macroscopic" health issues,  I am very reluctatnt to embark on an invasive w/u at this point but will arrange consevative  follow up and in the meantime see what we can do to address the patient's subjective concerns.           Each maintenance medication was reviewed in detail including emphasizing most importantly the difference between maintenance and prns and under what circumstances the prns are to be triggered using an action plan format where appropriate.  Total time for H and P, chart review, counseling, teaching device and generating customized AVS unique to this post hosp  office visit / charting = 48 min        >>> f/u in 03/2020 placed in reminder file

## 2019-08-24 NOTE — Progress Notes (Signed)
Robert Rich, male    DOB: 05-Aug-1965     MRN: [id number]   Brief patient profile:  54 yowm body shop owner  quit smoking 03-20-19 at wt 200 assoc  progressive sob x 2011 and required neb x 2020 and then admitted with IHD/ sudden death 03-20-2019 and back to baseline  then admit with aecopd:    Admit date: 08/04/2019 Discharge date: 08/11/2019  Brief/Interim Summary: 54 y.o.maleformer smokerwith medical history significantforsevere COPD, coronary artery disease, type 2 diabetes mellitus, dyslipidemia, alcohol use disorder and substance abuse with benzodiazepines and history of prior cocaine use was recently hospitalized discharged 2 weeks ago for a COPD exacerbation. He reports that he felt well after going home but when he ran out of steroids and antibiotics his symptoms slowly began to return over the course of the last 3 days. He became so severely short of breath today that he called EMS. He says that he had return to work last week and was working short hours up until 3 days ago when he had increased his work hours and noticed that his shortness of breath became acutely worse. He reports significant wheezing and increased work of breathing coughing and chest discomfort. He denies chest pain. He reports productive cough with yellow sputum. He can only walk a few feet without severe shortness of breath.  Discharge Diagnoses:   Acute on chronic respiratory failure with hypoxia  -secondary to severe COPD exacerbation --symptoms rapidly returned after he completed his steroid taper and antibiotics from recent hospitalization. -He has an outpatient pulmonary appt later this month but I requested inpatient consult given multiple admissions and ED visits. -appreciate pulmonary follow up-->wean prednisone over 2 weeks  COPDExacerbation -continue duonebs -de-escalate to po prednisone - Appreciate pulmonary consult. Discussed with Dr. Halina Maidens prednisone over next 2 weeks   -Unfortunately patient could not tolerate brovana neb treatments. -Continue Duonebs, continue pulmicort nebs -Pulmonary willl see him again on outpatient follow up 08/24/19. -They will arrange PFTs. -He was given lasix 20 mg IV daily x 4total doses during the hospitalization.   CAD  - stable, no chest pain symptoms, resumed his home heart medications.   Tobacco abuse - He reports he quit several months ago.  Polysubstance abuse  - UDS positive for benzo, opioid and barbituates.  Anxiety -alprazolam as needed only for severe symptoms.  Type 2 DM with steroid induced hyperglycemia -increased doses of insulin and follow closely especially while on IV steroids.  -Pt will need to DC home on insulin. He has used insulin before. He prefers vials and syringes. He will be on steroids for at least 2 weeks after discharge and will need insulin plus metformin to help control his steroid induced hyperglycemia -d/c home with novolin 70/30--50 units bid -instructed pt to continue checking CBGs ac/hs      overall 50% improved at d/c on prednisone tapered off 08/20/19 and about the same as at d/c but no longer using neb / maint on Anoro      History of Present Illness  08/24/2019  Pulmonary/ 1st office eval/ [facility]  Chief Complaint  Patient presents with  . Follow-up    shortness of breath with exertion  Dyspnea:  MMRC3 = can't walk 100 yards even at a slow pace at a flat grade s stopping due to sob   Cough: resolved  Sleep: flat  SABA use: combivent and anoro both in am then no other saba   No obvious day to day  or daytime variability or assoc excess/ purulent sputum or mucus plugs or hemoptysis or cp or chest tightness, subjective wheeze or overt sinus or hb symptoms.   Sleeping  without nocturnal  or early am exacerbation  of respiratory  c/o's or need for noct saba. Also denies any obvious fluctuation of symptoms with weather or environmental  changes or other aggravating or alleviating factors except as outlined above   No unusual exposure hx or h/o childhood pna/ asthma or knowledge of premature birth.  Current Allergies, Complete Past Medical History, Past Surgical History, Family History, and Social History were reviewed in [employer] record.  ROS  The following are not active complaints unless bolded Hoarseness, sore throat, dysphagia, dental problems, itching, sneezing,  nasal congestion or discharge of excess mucus or purulent secretions, ear ache,   fever, chills, sweats, unintended wt loss or wt gain, classically pleuritic or exertional cp,  orthopnea pnd or arm/hand swelling  or leg swelling, presyncope, palpitations, abdominal pain, anorexia, nausea, vomiting, diarrhea  or change in bowel habits or change in bladder habits, change in stools or change in urine, dysuria, hematuria,  rash, arthralgias, visual complaints, headache, numbness, weakness or ataxia or problems with walking or coordination,  change in mood or  memory.             Past Medical History:  Diagnosis Date  . COPD (chronic obstructive pulmonary disease) (HCC)   . Coronary artery disease   . DM (diabetes mellitus) (HCC)   . Heart attack (HCC)   . HLD (hyperlipidemia)   . HTN (hypertension)   . Obesity   . Stroke [facility])     Outpatient Medications Prior to Visit  Medication Sig Dispense Refill  . ALPRAZolam (XANAX) 0.5 MG tablet Take 1 tablet (0.5 mg total) by mouth every 12 (twelve) hours as needed for anxiety. (Patient taking differently: Take 1 mg by mouth every 12 (twelve) hours as needed for anxiety. ) 20 tablet 0  . atorvastatin (LIPITOR) 40 MG tablet Take 1 tablet (40 mg total) by mouth daily at 6 PM. 90 tablet 1  . butalbital-acetaminophen-caffeine (FIORICET) 50-325-40 MG tablet Take 1 tablet by mouth every 6 (six) hours as needed for migraine. 20 tablet 0  . clopidogrel (PLAVIX) 75 MG tablet Take 1 tablet (75 mg total)  by mouth daily. 30 tablet 2  . folic acid (FOLVITE) 1 MG tablet Take 1 tablet (1 mg total) by mouth daily. 30 tablet 2  . insulin NPH-regular Human (NOVOLIN 70/30) (70-30) 100 UNIT/ML injection Inject 50 Units into the skin 2 (two) times daily with a meal. 10 mL 1  . Ipratropium-Albuterol (COMBIVENT) 20-100 MCG/ACT AERS respimat Inhale 1 puff into the lungs every 6 (six) hours as needed for wheezing or shortness of breath. 4 g 3  . metFORMIN (GLUCOPHAGE) 1000 MG tablet Take 1 tablet (1,000 mg total) by mouth 2 (two) times daily with a meal. 60 tablet 2  . metoprolol succinate (TOPROL-XL) 25 MG 24 hr tablet Take 1 tablet (25 mg total) by mouth daily. 30 tablet 2  . montelukast (SINGULAIR) 10 MG tablet Take 1 tablet (10 mg total) by mouth at bedtime. 30 tablet 1  . Multiple Vitamin (MULTIVITAMIN WITH MINERALS) TABS tablet Take 1 tablet by mouth daily. 30 tablet 2  . thiamine 100 MG tablet Take 2.5 tablets (250 mg total) by mouth daily. 30 tablet 2  . umeclidinium-vilanterol (ANORO ELLIPTA) 62.5-25 MCG/INH AEPB Inhale 1 puff into the lungs daily. 30 each  2  . Vitamin D, Ergocalciferol, (DRISDOL) 1.25 MG (50000 UNIT) CAPS capsule Take 1 capsule (50,000 Units total) by mouth every 7 (seven) days. 5 capsule 1  . losartan (COZAAR) 25 MG tablet Take 1 tablet (25 mg total) by mouth daily. 90 tablet 3  . HYDROcodone-homatropine (HYCODAN) 5-1.5 MG/5ML syrup Take 5 mLs by mouth every 8 (eight) hours as needed (refractory coughing spells). 120 mL 0  . predniSONE (DELTASONE) 10 MG tablet  08/20/19 completed  0      Objective:     BP (!) 144/82 (BP Location: Left Arm, Cuff Size: Normal)   Pulse 72   Temp 99.2 F (37.3 C) (Oral)   Ht 5\' 10"  (1.778 m)   Wt (!) 241 lb 6.4 oz (109.5 kg)   SpO2 97% Comment: Room air  BMI 34.64 kg/m   SpO2: 97 % (Room air)   Obese wm    HEENT : pt wearing mask not removed for exam due to covid -19 concerns.    NECK :  without JVD/Nodes/TM/ nl carotid upstrokes  bilaterally   LUNGS: no acc muscle use,  Nl contour chest which is clear to A and P bilaterally without cough on insp or exp maneuvers   CV:  RRR  no s3 or murmur or increase in P2, and no edema   ABD:  Obese soft and nontender with nl inspiratory excursion in the supine position. No bruits or organomegaly appreciated, bowel sounds nl  MS:  Nl gait/ ext warm without deformities, calf tenderness, cyanosis or clubbing No obvious joint restrictions   SKIN: warm and dry without lesions    NEURO:  alert, approp, nl sensorium with  no motor or cerebellar deficits apparent.      I personally reviewed images and agree with radiology impression as follows:  CXR:   Portable 08/04/19  No active disease.    Assessment   COPD GOLD 0/  group B vs D  Quit smoking 02/2019 - CT chest 03/28/19 no emphysema -  08/24/2019   Walked RA  approx   300 ft  @ fast pace  stopped due to  End of study , no sob , sats 99% at end     Remains to be seen whether completely eliminating tobacco/ drugs will stabilize his problem but for now lungs are clear on anoro and just needs a better Action plan - see avs for instructions unique to this ov   >>> f/u with pfts in 3 m       Multiple pulmonary nodules determined by computed tomography of lung CT chest 03/28/19 Multiple tiny 2-4 mm pulmonary nodules scattered throughout the lungs bilaterally. No larger more suspicious appearing pulmonary nodules or masses. No acute consolidative airspace disease. No pleural effusions.  Although there are clearly abnormalities on CT scan, they should probably be considered "microscopic" since not obvious on plain cxr .     In the setting of obvious "macroscopic" health issues,  I am very reluctatnt to embark on an invasive w/u at this point but will arrange consevative  follow up and in the meantime see what we can do to address the patient's subjective concerns.        >>> Pt informed of the seriousness of COVID 19 infection  as a direct risk to lung health  and safey and to close contacts and should continue to wear a facemask in public and minimize exposure to public locations but especially avoid any area or activity where non-close contacts  are not observing distancing or wearing an appropriate face mask.  I strongly recommended she take either of the vaccines available through local drugstores based on updated information on millions of Americans treated with the Moderna and [employer] products  which have proven both safe and  effective even against the new delta variant.     Each maintenance medication was reviewed in detail including emphasizing most importantly the difference between maintenance and prns and under what circumstances the prns are to be triggered using an action plan format where appropriate.  Total time for H and P, chart review, counseling, teaching device and generating customized AVS unique to this post hosp  office visit / charting = 48 min        >>> f/u in 03/2020 placed in reminder file      Sandrea Hughs, MD 08/24/2019

## 2019-08-24 NOTE — Patient Instructions (Addendum)
Plan A = Automatic = Always=   Anoro one click take two drags  Plan B = Backup (to supplement plan A, not to replace it) Only use your albuterol inhaler as a rescue medication to be used if you can't catch your breath by resting or doing a relaxed purse lip breathing pattern.  - The less you use it, the better it will work when you need it. - Ok to use the inhaler up to 2 puffs  every 4 hours if you must but call for appointment if use goes up over your usual need - Don't leave home without it !!  (think of it like the spare tire for your car)   Try albuterol 15 min before an activity that you know would make you short of breath and see if it makes any difference and if makes none then don't take it after activity unless you can't catch your breath.      Plan C = Crisis (instead of Plan B but only if Plan B stops working) - only use your albuterol nebulizer if you first try Plan B and it fails to help > ok to use the nebulizer up to every 4 hours but if start needing it regularly call for immediate appointment  Make sure you check your oxygen saturations at highest level of activity to be sure it stays over 90% and keep track of it at least once a week, more often if breathing getting worse, and let me know if losing ground.   Pt informed of the seriousness of COVID 19 infection as a direct risk to lung health  and safey and to close contacts and should continue to wear a facemask in public and minimize exposure to public locations but especially avoid any area or activity where non-close contacts are not observing distancing or wearing an appropriate face mask.  I strongly recommended she take either of the vaccines available through local drugstores based on updated information on millions of Americans treated with the Moderna and [employer] products  which have proven both safe and  effective even against the new delta variant.    Please schedule a follow up visit in 3 months with PFTs  but call  sooner if needed  - ok to delay if you haven't got your insurance.

## 2019-08-24 NOTE — Assessment & Plan Note (Addendum)
Quit smoking 02/2019 - CT chest 03/28/19 no emphysema -  08/24/2019   Walked RA  approx   300 ft  @ fast pace  stopped due to  End of study , no sob , sats 99% at end     Remains to be seen whether completely eliminating tobacco/ drugs will stabilize his problem but for now lungs are clear on anoro and just needs a better Action plan - see avs for instructions unique to this ov  Pt informed of the seriousness of COVID 19 infection as a direct risk to lung health  and safey and to close contacts and should continue to wear a facemask in public and minimize exposure to public locations but especially avoid any area or activity where non-close contacts are not observing distancing or wearing an appropriate face mask.  I strongly recommended she take either of the vaccines available through local drugstores based on updated information on millions of Americans treated with the Moderna and [employer] products  which have proven both safe and  effective even against the new delta variant.     >>> f/u with pfts in 3 m

## 2019-09-14 ENCOUNTER — Telehealth: Payer: Self-pay | Admitting: Internal Medicine

## 2019-09-14 NOTE — Telephone Encounter (Signed)
Patient informed we do not have sample of Anoro, cost is 450 dollars, insurance kicks in in 30-60 days. Can we provide another sample to hold him over or change his prescription? Please advise.

## 2019-09-15 NOTE — Telephone Encounter (Signed)
We don't have enough anoro to cover him for more than a few weeks.  See if we can do pt assistance to tide him over until insurance starts

## 2019-09-15 NOTE — Telephone Encounter (Signed)
Called and spoke with Patient. Dr Thurston Hole recommendations given. Patient requested [employer] Patient assist forms be mailed.  Application placed in out going mail. Nothing further at this time.

## 2019-10-17 ENCOUNTER — Telehealth: Payer: Self-pay | Admitting: Internal Medicine

## 2019-10-17 MED ORDER — ANORO ELLIPTA 62.5-25 MCG/INH IN AEPB: 1.0000 | INHALATION_SPRAY | Freq: Every day | RESPIRATORY_TRACT | 3 refills | Status: DC

## 2019-10-17 MED ORDER — UMECLIDINIUM-VILANTEROL 62.5-25 MCG/INH IN AEPB: 1.0000 | INHALATION_SPRAY | Freq: Every day | RESPIRATORY_TRACT | 2 refills | Status: AC

## 2019-10-17 NOTE — Telephone Encounter (Signed)
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 No larger more suspicious appearing pulmonary nodules or masses. No acute consolidative airspace disease. No pleural effusions.  Although there are clearly abnormalities on CT scan, they should probably be considered "microscopic" since not obvious on plain cxr .     In the setting of obvious "macroscopic" health issues,  I am very reluctatnt to embark on an invasive w/u at this point but will arrange consevative  follow up and in the meantime see what we can do to address the patient's subjective concerns.        >>> Pt informed of the seriousness of COVID 19 infection as a direct risk to lung health  and safey and to close contacts and should continue to wear a facemask in public and minimize exposure to public locations but especially avoid any area or activity where non-close contacts are not observing distancing or wearing an appropriate face mask.  I strongly recommended she take either of the vaccines available through local drugstores based on updated information on millions of Americans treated with the Moderna and [employer] products  which have proven both safe and  effective even against the new delta variant.      Each maintenance medication was reviewed in detail including emphasizing most importantly the difference between maintenance and prns and under what circumstances the prns are to be triggered using an action plan format where appropriate.  Total time for H and P, chart review, counseling, teaching device and generating customized AVS unique to this post hosp  office visit / charting = 48 min        >>> f/u in 03/2020 placed in reminder file      Robert HughsMichael Wert, MD 08/24/2019       Assessment & Plan Note by Nyoka CowdenWert, Michael B, MD at 08/24/2019 3:29 PM Author: Nyoka CowdenWert, Michael B, MD Author Type: Physician Filed: 08/24/2019 3:41 PM  Note Status: Carlisle CaterEdited Cosign: Cosign Not Required Encounter Date: 08/24/2019  Problem: Multiple pulmonary nodules determined by computed tomography of lung  Editor: Nyoka CowdenWert, Michael B, MD (Physician)      Prior Versions: 1. Nyoka CowdenWert, Michael B, MD (Physician) at 08/24/2019 3:38 PM - Edited   2. Nyoka CowdenWert, Michael B, MD (Physician) at 08/24/2019 3:30 PM - Written    CT chest 03/28/19 Multiple tiny 2-4 mm pulmonary nodules scattered throughout the lungs bilaterally. No larger more suspicious appearing pulmonary nodules or masses. No acute consolidative airspace disease. No pleural effusions.  Although there are clearly abnormalities on CT scan, they should probably be considered "microscopic" since not obvious on plain cxr .     In the setting  of obvious "macroscopic" health issues,  I am very reluctatnt to embark on an invasive w/u at this point but will arrange consevative  follow up and in the meantime see what we can do to address the patient's subjective concerns.           Each maintenance medication was reviewed in detail including emphasizing most importantly the difference between maintenance and prns and under what circumstances the prns are to be triggered using an action plan format where appropriate.  Total time for H and P, chart review, counseling,  teaching device and generating customized AVS unique to this post hosp  office visit / charting = 48 min        >>> f/u in 03/2020 placed in reminder file     Assessment & Plan Note by [name], MD at 08/24/2019 3:27 PM Author: [name], MD Author Type: Physician Filed: 08/24/2019 3:40 PM  Note Status: Carl[place] Cater: Cosign Not Required Encounter Date: 08/24/2019  Problem: COPD GOLD 0/ group B vs D   Editor: [name], MD (Physician)      Prior Versions: 1. [name], MD (Physician) at 08/24/2019 3:39 PM - Edited   2. [name], MD (Physician) at 08/24/2019 3:28 PM - Written    Quit smoking 02/2019 - CT chest 03/28/19 no emphysema -  08/24/2019   Walked RA  approx   300 ft  @ fast pace  stopped due to  End of study , no sob , sats 99% at end     Remains to be seen whether completely eliminating tobacco/ drugs will stabilize his problem but for now lungs are clear on anoro and just needs a better Action plan - see avs for instructions unique to this ov  Pt informed of the seriousness of COVID 19 infection as a direct risk to lung health  and safey and to close contacts and should continue to wear a facemask in public and minimize exposure to public locations but especially avoid any area or activity where non-close contacts are not observing distancing or wearing an appropriate face mask.  I strongly recommended she take either of the vaccines available through local drugstores based on updated information on millions of Americans treated with the Moderna and Pfizer products  which have proven both safe and  effective even against the new delta variant.     >>> f/u with pfts in 3 m      Patient Instructions by [name], MD at 08/24/2019 2:30 PM Author: [name], MD Author Type: Physician Filed: 08/24/2019 3:08 PM  Note Status: Addendum Cosign: Cosign Not Required Encounter Date: 08/24/2019  Editor: [name], MD (Physician)      Prior  Versions: 1. [name], MD (Physician) at 08/24/2019 3:04 PM - Signed    Plan A = Automatic = Always=   Anoro one click take two drags  Plan B = Backup (to supplement plan A, not to replace it) Only use your albuterol inhaler as a rescue medication to be used if you can't catch your breath by resting or doing a relaxed purse lip breathing pattern.  - The less you use it, the better it will work when you need it. - Ok to use the inhaler up to 2 puffs  every 4 hours if you must but call for appointment if use goes up over your usual need - Don't leave home without it !!  (think of it  like the spare tire for your car)   Try albuterol 15 min before an activity that you know would make you short of breath and see if it makes any difference and if makes none then don't take it after activity unless you can't catch your breath.      Plan C = Crisis (instead of Plan B but only if Plan B stops working) - only use your albuterol nebulizer if you first try Plan B and it fails to help > ok to use the nebulizer up to every 4 hours but if start needing it regularly call for immediate appointment  Make sure you check your oxygen saturations at highest level of activity to be sure it stays over 90% and keep track of it at least once a week, more often if breathing getting worse, and let me know if losing ground.   Pt informed of the seriousness of COVID 19 infection as a direct risk to lung health  and safey and to close contacts and should continue to wear a facemask in public and minimize exposure to public locations but especially avoid any area or activity where non-close contacts are not observing distancing or wearing an appropriate face mask.  I strongly recommended she take either of the vaccines available through local drugstores based on updated information on millions of Americans treated with the Moderna and [employer] products  which have proven both safe and  effective even against the new  delta variant.    Please schedule a follow up visit in 3 months with PFTs  but call sooner if needed  - ok to delay if you haven't got your insurance.     Instructions  Plan A = Automatic = Always=   Anoro one click take two drags  Plan B = Backup (to supplement plan A, not to replace it) Only use your albuterol inhaler as a rescue medication to be used if you can't catch your breath by resting or doing a relaxed purse lip breathing pattern.  - The less you use it, the better it will work when you need it. - Ok to use the inhaler up to 2 puffs  every 4 hours if you must but call for appointment if use goes up over your usual need - Don't leave home without it !!  (think of it like the spare tire for your car)   Try albuterol 15 min before an activity that you know would make you short of breath and see if it makes any difference and if makes none then don't take it after activity unless you can't catch your breath.      Plan C = Crisis (instead of Plan B but only if Plan B stops working) - only use your albuterol nebulizer if you first try Plan B and it fails to help > ok to use the nebulizer up to every 4 hours but if start needing it regularly call for immediate appointment  Make sure you check your oxygen saturations at highest level of activity to be sure it stays over 90% and keep track of it at least once a week, more often if breathing getting worse, and let me know if losing ground.   Pt informed of the seriousness of COVID 19 infection as a direct risk to lung health  and safey and to close contacts and should continue to wear a facemask in public and minimize exposure to public locations but especially avoid any area or activity where non-close  contacts are not observing distancing or wearing an appropriate face mask.  I strongly recommended she take either of the vaccines available through local drugstores based on updated information on millions of Americans treated with the  Moderna and [employer] products  which have proven both safe and  effective even against the new delta variant.    Please schedule a follow up visit in 3 months with PFTs  but call sooner if needed  - ok to delay if you haven't got your insurance.         After Visit Summary (Printed 08/24/2019) Communications    [facility] Provider CC Chart Rep sent to Rebecka Apley, NP Media From this encounter Electronic signature on 08/24/2019 2:20 PM - 1 of 3 e-signatures recorded  Communication Routing History  Recipient Method Sent by Date Sent  Rebecka Apley, NP Fax [name], MD 08/24/2019  Fax: [phone]  Phone: [phone]   No questionnaires available        Was appointment offered to patient (explain)?     Reason for call: SOB,Cough and wheezing. 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 Patient would like a inhaler sent into his pharmacy that he can afford.   Dr.Wert can you please advise.  Thank you  Allergies  Allergen Reactions  . Asa [Aspirin] Hives  . Zolmitriptan Nausea And Vomiting and Other (See Comments)    Severe headaches     There is no immunization history on file for this patient.

## 2019-10-17 NOTE — Telephone Encounter (Signed)
Called and spoke with pt  Letting him know the info stated by MW about the short term option and long term option. Pt said anoro really works well for him and that [facility] accepts [employer] which he could get it there for $200 vs $400 at [facility] and he would pay the $200 if needed due to it working so well for him. Stated to pt that I would send Rx to [facility] in [place] for him and he verbalized understanding. Rx has been sent to pharmacy for pt.  Also stated to pt that I would check on status of patient assistance paperwork as he stated he dropped it off at the [place] office about 2 weeks ago and has not heard anything about it.  Called [employer] patient assistance program at [phone] [employer] to check status of pt's application. Per [employer], application has been received but they are needing a prescription printed to go along with the application. Stated to [place] that we would send Rx in. When verifying the address with [employer], address that we have on file for pt is different from the address that is listed on the application. Pt will need to contact [employer] patient assistance to verify address in order for them to fully process the application once the Rx has been received.  Called and spoke with pt letting him know the info I found out from [employer] and stated to him that I was going to send Rx to [facility] for anoro and that he needed to call them to verify address. Pt verbalized understanding. Provided pt with [employer] phone number. Nothing further needed.

## 2019-10-17 NOTE — Telephone Encounter (Signed)
Best option short term is use his back up neb  longterm best to let pharmacy look for cheapest alternative to anoro

## 2019-10-25 ENCOUNTER — Telehealth: Payer: Self-pay | Admitting: Internal Medicine

## 2019-10-25 ENCOUNTER — Encounter (HOSPITAL_COMMUNITY): Payer: Self-pay

## 2019-10-25 ENCOUNTER — Other Ambulatory Visit: Payer: Self-pay

## 2019-10-25 ENCOUNTER — Emergency Department (HOSPITAL_COMMUNITY)
Admission: EM | Admit: 2019-10-25 | Discharge: 2019-10-25 | Disposition: A | Discharge status: Home / Self Care | Payer: Medicaid Other | Attending: Emergency Medicine | Admitting: Emergency Medicine

## 2019-10-25 ENCOUNTER — Emergency Department (HOSPITAL_COMMUNITY): Payer: Medicaid Other

## 2019-10-25 DIAGNOSIS — Z7984 Long term (current) use of oral hypoglycemic drugs: Secondary | ICD-10-CM | POA: Insufficient documentation

## 2019-10-25 DIAGNOSIS — I10 Essential (primary) hypertension: Secondary | ICD-10-CM | POA: Insufficient documentation

## 2019-10-25 DIAGNOSIS — E119 Type 2 diabetes mellitus without complications: Secondary | ICD-10-CM | POA: Insufficient documentation

## 2019-10-25 DIAGNOSIS — I2510 Atherosclerotic heart disease of native coronary artery without angina pectoris: Secondary | ICD-10-CM | POA: Diagnosis not present

## 2019-10-25 DIAGNOSIS — R0602 Shortness of breath: Secondary | ICD-10-CM | POA: Diagnosis present

## 2019-10-25 DIAGNOSIS — J441 Chronic obstructive pulmonary disease with (acute) exacerbation: Secondary | ICD-10-CM

## 2019-10-25 DIAGNOSIS — Z955 Presence of coronary angioplasty implant and graft: Secondary | ICD-10-CM | POA: Insufficient documentation

## 2019-10-25 DIAGNOSIS — Z87891 Personal history of nicotine dependence: Secondary | ICD-10-CM | POA: Diagnosis not present

## 2019-10-25 DIAGNOSIS — Z79899 Other long term (current) drug therapy: Secondary | ICD-10-CM | POA: Insufficient documentation

## 2019-10-25 DIAGNOSIS — Z20822 Contact with and (suspected) exposure to covid-19: Secondary | ICD-10-CM | POA: Insufficient documentation

## 2019-10-25 LAB — CBC WITH DIFFERENTIAL/PLATELET
Abs Immature Granulocytes: 0.04 10*3/uL (ref 0.00–0.07)
Basophils Absolute: 0.2 10*3/uL — ABNORMAL HIGH (ref 0.0–0.1)
Basophils Relative: 2 %
Eosinophils Absolute: 1 10*3/uL — ABNORMAL HIGH (ref 0.0–0.5)
Eosinophils Relative: 9 %
HCT: 38.6 % — ABNORMAL LOW (ref 39.0–52.0)
Hemoglobin: 12.3 g/dL — ABNORMAL LOW (ref 13.0–17.0)
Immature Granulocytes: 0 %
Lymphocytes Relative: 17 %
Lymphs Abs: 1.9 10*3/uL (ref 0.7–4.0)
MCH: 28 pg (ref 26.0–34.0)
MCHC: 31.9 g/dL (ref 30.0–36.0)
MCV: 87.7 fL (ref 80.0–100.0)
Monocytes Absolute: 0.4 10*3/uL (ref 0.1–1.0)
Monocytes Relative: 4 %
Neutro Abs: 7.4 10*3/uL (ref 1.7–7.7)
Neutrophils Relative %: 68 %
Platelets: 435 10*3/uL — ABNORMAL HIGH (ref 150–400)
RBC: 4.4 MIL/uL (ref 4.22–5.81)
RDW: 13.8 % (ref 11.5–15.5)
WBC: 10.9 10*3/uL — ABNORMAL HIGH (ref 4.0–10.5)
nRBC: 0 % (ref 0.0–0.2)

## 2019-10-25 LAB — BASIC METABOLIC PANEL
Anion gap: 9 (ref 5–15)
BUN: 12 mg/dL (ref 6–20)
CO2: 24 mmol/L (ref 22–32)
Calcium: 8.8 mg/dL — ABNORMAL LOW (ref 8.9–10.3)
Chloride: 105 mmol/L (ref 98–111)
Creatinine, Ser: 0.57 mg/dL — ABNORMAL LOW (ref 0.61–1.24)
GFR calc Af Amer: >60 mL/min (ref >60)
GFR calc non Af Amer: >60 mL/min (ref >60)
Glucose, Bld: 141 mg/dL — ABNORMAL HIGH (ref 70–99)
Potassium: 4 mmol/L (ref 3.5–5.1)
Sodium: 138 mmol/L (ref 135–145)

## 2019-10-25 LAB — RESPIRATORY PANEL BY RT PCR (FLU A&B, COVID)
Influenza A by PCR: NEGATIVE
Influenza B by PCR: NEGATIVE
SARS Coronavirus 2 by RT PCR: NEGATIVE

## 2019-10-25 MED ORDER — ALBUTEROL (5 MG/ML) CONTINUOUS INHALATION SOLN: 10.0000 mg/h | INHALATION_SOLUTION | Freq: Once | RESPIRATORY_TRACT | Status: DC

## 2019-10-25 MED ORDER — ALBUTEROL SULFATE (2.5 MG/3ML) 0.083% IN NEBU: 2.5000 mg | INHALATION_SOLUTION | RESPIRATORY_TRACT | 12 refills | Status: DC | PRN

## 2019-10-25 MED ORDER — IPRATROPIUM BROMIDE 0.02 % IN SOLN
0.5000 mg | Freq: Once | RESPIRATORY_TRACT | Status: DC
  Filled 2019-10-25: qty 2.5

## 2019-10-25 MED ORDER — IPRATROPIUM BROMIDE 0.02 % IN SOLN
0.5000 mg | Freq: Once | RESPIRATORY_TRACT | Status: AC
  Administered 2019-10-25: 0.5 mg via RESPIRATORY_TRACT
  Filled 2019-10-25: qty 2.5

## 2019-10-25 MED ORDER — ALBUTEROL (5 MG/ML) CONTINUOUS INHALATION SOLN
10.0000 mg/h | INHALATION_SOLUTION | Freq: Once | RESPIRATORY_TRACT | Status: DC
  Filled 2019-10-25: qty 20

## 2019-10-25 MED ORDER — IPRATROPIUM BROMIDE 0.02 % IN SOLN: 0.5000 mg | Freq: Four times a day (QID) | RESPIRATORY_TRACT | 12 refills | Status: DC

## 2019-10-25 MED ORDER — MAGNESIUM SULFATE IN D5W 1-5 GM/100ML-% IV SOLN
1.0000 g | Freq: Once | INTRAVENOUS | Status: AC
  Administered 2019-10-25: 1 g via INTRAVENOUS
  Filled 2019-10-25: qty 100

## 2019-10-25 MED ORDER — ALBUTEROL SULFATE HFA 108 (90 BASE) MCG/ACT IN AERS
4.0000 | INHALATION_SPRAY | Freq: Once | RESPIRATORY_TRACT | Status: AC
  Administered 2019-10-25: 4 via RESPIRATORY_TRACT
  Filled 2019-10-25: qty 6.7

## 2019-10-25 MED ORDER — METHYLPREDNISOLONE SODIUM SUCC 125 MG IJ SOLR
125.0000 mg | Freq: Once | INTRAMUSCULAR | Status: AC
  Administered 2019-10-25: 125 mg via INTRAVENOUS
  Filled 2019-10-25: qty 2

## 2019-10-25 MED ORDER — PREDNISONE 10 MG (21) PO TBPK: ORAL_TABLET | ORAL | 0 refills | Status: DC

## 2019-10-25 MED ORDER — ALBUTEROL SULFATE (2.5 MG/3ML) 0.083% IN NEBU
5.0000 mg | INHALATION_SOLUTION | RESPIRATORY_TRACT | Status: DC | PRN
  Administered 2019-10-25: 5 mg via RESPIRATORY_TRACT
  Filled 2019-10-25: qty 6

## 2019-10-25 NOTE — ED Provider Notes (Signed)
[facility] Provider Note   CSN: [id number] Arrival date & time: 10/25/19  1021     History Chief Complaint  Patient presents with   Shortness of Breath    Robert Rich is a 54 y.o. male.  HPI   Patient presented to the ED with complaints of shortness of breath.  Patient has a history of COPD.  He has had frequent hospitalizations and is managed by pulmonology for his recurrent exacerbation.  Patient states his doctor started on a new medication but because of cost issues he has not been able to get it recently.  He started to become more short of breath in the last few days and this morning it was more severe.  Called his doctor who suggested he come to the hospital because of the severity of his symptoms.  Patient tried to drive there but on the way over he became acutely more short of breath and called EMS.  He denies any chest pain.  No fevers or chills.  Patient has been vaccinated for Covid  Past Medical History:  Diagnosis Date   COPD (chronic obstructive pulmonary disease) (HCC)    Coronary artery disease    DM (diabetes mellitus) (HCC)    Heart attack (HCC)    HLD (hyperlipidemia)    HTN (hypertension)    Obesity    Stroke [facility](HCC)     Patient Active Problem List   Diagnosis Date Noted   COPD GOLD 0/  group B vs D  08/24/2019   Multiple pulmonary nodules determined by computed tomography of lung 08/24/2019   COPD exacerbation (HCC)    COPD with acute exacerbation (HCC) 08/04/2019   Coronary artery disease    HLD (hyperlipidemia)    Leukocytosis    Hyperglycemia    Steroid-induced hyperglycemia    Acute respiratory failure with hypoxia (HCC)    Class 1 obesity due to excess calories with serious comorbidity and body mass index (BMI) of 32.0 to 32.9 in adult    HTN (hypertension)    Anxiety    Substance abuse (HCC)    Cardiac arrest (HCC) 03/17/2019   2019 novel coronavirus disease (COVID-19) 12/07/2018   Tobacco  abuse 12/06/2018    Past Surgical History:  Procedure Laterality Date   INTRAVASCULAR PRESSURE WIRE/FFR STUDY N/A 03/18/2019   Procedure: INTRAVASCULAR PRESSURE WIRE/FFR STUDY;  Surgeon: Yates DecampGanji, Jay, MD;  Location: [facility] INVASIVE CV LAB;  Service: Cardiovascular;  Laterality: N/A;   LEFT HEART CATH AND CORONARY ANGIOGRAPHY N/A 03/18/2019   Procedure: LEFT HEART CATH AND CORONARY ANGIOGRAPHY;  Surgeon: Yates DecampGanji, Jay, MD;  Location: [facility] INVASIVE CV LAB;  Service: Cardiovascular;  Laterality: N/A;   NO PAST SURGERIES         Family History  Problem Relation Age of Onset   Kidney disease Father    Heart disease Mother    Heart failure Mother    Atrial fibrillation Mother     Social History   Tobacco Use   Smoking status: Former Smoker    Packs/day: 0.10    Years: 20.00    Pack years: 2.00    Types: Cigarettes    Quit date: 03/17/2019    Years since quitting: 0.6   Smokeless tobacco: Never Used   Tobacco comment: Quit in feb of 2021  Vaping Use   Vaping Use: Never used  Substance Use Topics   Alcohol use: Not Currently    Alcohol/week: 12.0 standard drinks    Types: 12 Cans of beer  per week    Comment: Quit in Feb 2021   Drug use: Not Currently    Home Medications Prior to Admission medications   Medication Sig Start Date End Date Taking? Authorizing Provider  albuterol (PROVENTIL) (2.5 MG/3ML) 0.083% nebulizer solution Take 3 mLs (2.5 mg total) by nebulization every 4 (four) hours as needed for wheezing or shortness of breath. 10/25/19   [name], MD  ALPRAZolam Prudy Feeler) 0.5 MG tablet Take 1 tablet (0.5 mg total) by mouth every 12 (twelve) hours as needed for anxiety. Patient taking differently: Take 1 mg by mouth every 12 (twelve) hours as needed for anxiety.  07/22/19   [name], MD  atorvastatin (LIPITOR) 40 MG tablet Take 1 tablet (40 mg total) by mouth daily at 6 PM. 07/22/19   [name], MD  butalbital-acetaminophen-caffeine (FIORICET) [phone] MG  tablet Take 1 tablet by mouth every 6 (six) hours as needed for migraine. 07/22/19   [name], MD  clopidogrel (PLAVIX) 75 MG tablet Take 1 tablet (75 mg total) by mouth daily. 07/22/19   [name], MD  folic acid (FOLVITE) 1 MG tablet Take 1 tablet (1 mg total) by mouth daily. 07/22/19   [name], MD  insulin NPH-regular Human (NOVOLIN 70/30) (70-30) 100 UNIT/ML injection Inject 50 Units into the skin 2 (two) times daily with a meal. 08/11/19   [name], [name], MD  ipratropium (ATROVENT) 0.02 % nebulizer solution Take 2.5 mLs (0.5 mg total) by nebulization 4 (four) times daily. 10/25/19   [name], MD  losartan (COZAAR) 25 MG tablet Take 1 tablet (25 mg total) by mouth daily. 04/18/19 07/18/19  [name], MD  metFORMIN (GLUCOPHAGE) 1000 MG tablet Take 1 tablet (1,000 mg total) by mouth 2 (two) times daily with a meal. 07/22/19   [name], MD  metoprolol succinate (TOPROL-XL) 25 MG 24 hr tablet Take 1 tablet (25 mg total) by mouth daily. 07/22/19   [name], MD  montelukast (SINGULAIR) 10 MG tablet Take 1 tablet (10 mg total) by mouth at bedtime. 08/11/19   [name], MD  Multiple Vitamin (MULTIVITAMIN WITH MINERALS) TABS tablet Take 1 tablet by mouth daily. 07/23/19   [name], MD  predniSONE (STERAPRED UNI-PAK 21 TAB) 10 MG (21) TBPK tablet Take 6 tablets (60 mg total) by mouth daily for 2 days, THEN 5 tablets (50 mg total) daily for 2 days, THEN 4 tablets (40 mg total) daily for 2 days, THEN 3 tablets (30 mg total) daily for 2 days, THEN 2 tablets (20 mg total) daily for 2 days, THEN 1 tablet (10 mg total) daily for 2 days. 10/25/19 11/06/19  [name], MD  thiamine 100 MG tablet Take 2.5 tablets (250 mg total) by mouth daily. 07/22/19   [name], MD  umeclidinium-vilanterol [facility] ELLIPTA) 62.5-25 MCG/INH AEPB Inhale 1 puff into the lungs daily. 10/17/19 01/15/20  [name], MD  umeclidinium-vilanterol (ANORO ELLIPTA) 62.5-25 MCG/INH AEPB Inhale 1 puff into the lungs  daily. 10/17/19   [name], MD  Vitamin D, Ergocalciferol, (DRISDOL) 1.25 MG (50000 UNIT) CAPS capsule Take 1 capsule (50,000 Units total) by mouth every 7 (seven) days. 08/12/19   [name], MD    Allergies    Asa [aspirin] and Zolmitriptan  Review of Systems   Review of Systems  All other systems reviewed and are negative.   Physical Exam Updated Vital Signs BP 112/67    Pulse 86    Temp 98.1 F (36.7 C) (Oral)  Resp (!) 22    Ht 1.753 m (5\' 9" )    Wt 113.4 kg    SpO2 95%    BMI 36.92 kg/m   Physical Exam Vitals and nursing note reviewed.  Constitutional:      Appearance: He is well-developed. He is not ill-appearing.  HENT:     Head: Normocephalic and atraumatic.     Right Ear: External ear normal.     Left Ear: External ear normal.  Eyes:     General: No scleral icterus.       Right eye: No discharge.        Left eye: No discharge.     Conjunctiva/sclera: Conjunctivae normal.  Neck:     Trachea: No tracheal deviation.  Cardiovascular:     Rate and Rhythm: Normal rate and regular rhythm.  Pulmonary:     Effort: Pulmonary effort is normal. No respiratory distress.     Breath sounds: No stridor. Wheezing present. No rales.  Abdominal:     General: Bowel sounds are normal. There is no distension.     Palpations: Abdomen is soft.     Tenderness: There is no abdominal tenderness. There is no guarding or rebound.  Musculoskeletal:        General: No tenderness.     Cervical back: Neck supple.     Right lower leg: No edema.     Left lower leg: No edema.  Skin:    General: Skin is warm and dry.     Findings: No rash.  Neurological:     Mental Status: He is alert.     Cranial Nerves: No cranial nerve deficit (no facial droop, extraocular movements intact, no slurred speech).     Sensory: No sensory deficit.     Motor: No abnormal muscle tone or seizure activity.     Coordination: Coordination normal.     ED Results / Procedures / Treatments   Labs (all  labs ordered are listed, but only abnormal results are displayed) Labs Reviewed  BASIC METABOLIC PANEL - Abnormal; Notable for the following components:      Result Value   Glucose, Bld 141 (*)    Creatinine, Ser 0.57 (*)    Calcium 8.8 (*)    All other components within normal limits  CBC WITH DIFFERENTIAL/PLATELET - Abnormal; Notable for the following components:   WBC 10.9 (*)    Hemoglobin 12.3 (*)    HCT 38.6 (*)    Platelets 435 (*)    Eosinophils Absolute 1.0 (*)    Basophils Absolute 0.2 (*)    All other components within normal limits  RESPIRATORY PANEL BY RT PCR (FLU A&B, COVID)    EKG EKG Interpretation  Date/Time:  Tuesday October 25 2019 10:32:37 EDT Ventricular Rate:  85 PR Interval:    QRS Duration: 92 QT Interval:  381 QTC Calculation: 453 R Axis:   32 Text Interpretation: Sinus rhythm No significant change since last tracing Confirmed by [id number] [phone]) on 10/25/2019 10:50:28 AM   Radiology DG Chest Port 1 View  Result Date: 10/25/2019 CLINICAL DATA:  COPD EXAM: PORTABLE CHEST 1 VIEW COMPARISON:  08/04/2019 FINDINGS: The heart size and mediastinal contours are within normal limits. No focal airspace consolidation, pleural effusion, or pneumothorax. The visualized skeletal structures are unremarkable. IMPRESSION: No active disease. Electronically Signed   By: 10/05/2019 D.O.   On: 10/25/2019 11:12    Procedures Procedures (including critical care time)  Medications Ordered in ED Medications  ipratropium (ATROVENT) nebulizer solution 0.5 mg (0.5 mg Nebulization Not Given 10/25/19 1100)  albuterol (PROVENTIL) (2.5 MG/3ML) 0.083% nebulizer solution 5 mg (5 mg Nebulization Given 10/25/19 1449)  methylPREDNISolone sodium succinate (SOLU-MEDROL) 125 mg/2 mL injection 125 mg (125 mg Intravenous Given 10/25/19 1105)  magnesium sulfate IVPB 1 g 100 mL (0 g Intravenous Stopped 10/25/19 1208)  albuterol (VENTOLIN HFA) 108 (90 Base) MCG/ACT inhaler 4 puff (4  puffs Inhalation Given 10/25/19 1130)  ipratropium (ATROVENT) nebulizer solution 0.5 mg (0.5 mg Nebulization Given 10/25/19 1448)    ED Course  I have reviewed the triage vital signs and the nursing notes.  Pertinent labs & imaging results that were available during my care of the patient were reviewed by me and considered in my medical decision making (see chart for details).  Clinical Course as of Oct 24 1533  Tue Oct 25, 2019  1206 Covid tests are negative.  Flu is negative.  CBC shows slight leukocytosis.  Metabolic panel is unremarkable with exception of hyperglycemia   [JK]  1206 Chest x-ray without pneumonia   [JK]  1247 Nebulizer was ordered previously.  Not given by RT.  Covid test is negative.  Will re order.  Pt is still wheezing on exam   [JK]    Clinical Course User Index [JK] [name], MD   MDM Rules/Calculators/A&P                          Pt presents with copd exacerbation.   Pt has history of frequent exacerbations.  Notably wheezing on exam.  Ed workup reassuring.  No oxygen requirement.  Does get more short of breath with activity.  Improved with treatment.  Only slight wheeze now on exam.  Will hold for additional treatment.  Anticipate discharge.  Dr Deretha Emory to follow up after last treatment. Final Clinical Impression(s) / ED Diagnoses Final diagnoses:  COPD exacerbation (HCC)    Rx / DC Orders ED Discharge Orders         Ordered    predniSONE (STERAPRED UNI-PAK 21 TAB) 10 MG (21) TBPK tablet        10/25/19 1532    albuterol (PROVENTIL) (2.5 MG/3ML) 0.083% nebulizer solution  Every 4 hours PRN        10/25/19 1532    ipratropium (ATROVENT) 0.02 % nebulizer solution  4 times daily        10/25/19 1532           [name], MD 10/25/19 1537

## 2019-10-25 NOTE — ED Triage Notes (Signed)
EMS brought pt . Pt  was with wife in the car on the way to a hospital but pt became to short of breath that he called EMS. A&O. Pt has been without COPD inhaler due to no insurance.

## 2019-10-25 NOTE — Discharge Instructions (Signed)
Take your medications as prescribed.  Follow up with your pulmonary doctor.  Return as needed for worsening symptoms

## 2019-10-25 NOTE — ED Provider Notes (Signed)
Patient feeling much better.  Requesting to be discharged home.  Treated for exacerbation of COPD.  Has medications to take at home as well.   Vanetta Mulders, MD 10/25/19 [phone]

## 2019-10-25 NOTE — Telephone Encounter (Signed)
Attempted to call pt's wife Patricia but unable to reach. Left message for her to return call. 

## 2019-10-26 NOTE — Telephone Encounter (Signed)
Patient's wife returning call from yesterday.  [phone]

## 2019-10-26 NOTE — Telephone Encounter (Signed)
Spoke with the pt's spouse  She states that pt went to ED yesterday for symptoms of wheezing and SOB  She he was sent home and he is not feeling any better- still wheezing, SOB and having cough with brown to green sputum  He denies any f/c/s, body aches  They gave him pred taper which he just started on today and albuterol nebs  Has had both covid vaccines and neg test done on 10/25/19  I offered appt with MW and he refused stating did not feel like coming in  Has appt with MW 11/18/19 and PFT on 11/01/19  I advised would call back tomorrow with recs per MW and in the meantime go to ED if symptoms persist or worsen  Please advise thanks Here are the last ov recs (spouse states he is taking the anoro and using both albuterol inhaler and nebs) Instructions  Plan A = Automatic = Always=   Anoro one click take two drags  Plan B = Backup (to supplement plan A, not to replace it) Only use your albuterol inhaler as a rescue medication to be used if you can't catch your breath by resting or doing a relaxed purse lip breathing pattern.  - The less you use it, the better it will work when you need it. - Ok to use the inhaler up to 2 puffs  every 4 hours if you must but call for appointment if use goes up over your usual need - Don't leave home without it !!  (think of it like the spare tire for your car)   Try albuterol 15 min before an activity that you know would make you short of breath and see if it makes any difference and if makes none then don't take it after activity unless you can't catch your breath.      Plan C = Crisis (instead of Plan B but only if Plan B stops working) - only use your albuterol nebulizer if you first try Plan B and it fails to help > ok to use the nebulizer up to every 4 hours but if start needing it regularly call for immediate appointment  Make sure you check your oxygen saturations at highest level of activity to be sure it stays over 90% and keep track of it at  least once a week, more often if breathing getting worse, and let me know if losing ground.   Pt informed of the seriousness of COVID 19 infection as a direct risk to lung health  and safey and to close contacts and should continue to wear a facemask in public and minimize exposure to public locations but especially avoid any area or activity where non-close contacts are not observing distancing or wearing an appropriate face mask.  I strongly recommended she take either of the vaccines available through local drugstores based on updated information on millions of Americans treated with the Moderna and [employer] products  which have proven both safe and  effective even against the new delta variant.    Please schedule a follow up visit in 3 months with PFTs  but call sooner if needed  - ok to delay if you haven't got your insurance.

## 2019-10-27 ENCOUNTER — Other Ambulatory Visit: Payer: Self-pay

## 2019-10-27 MED ORDER — ANORO ELLIPTA 62.5-25 MCG/INH IN AEPB: 1.0000 | INHALATION_SPRAY | Freq: Every day | RESPIRATORY_TRACT | 0 refills | Status: DC

## 2019-10-27 NOTE — Telephone Encounter (Signed)
Patient's wife called back, Wife states patient loves his anoro and it works well when he has it. He just got approved for it and it is being mailed to him. He has had to use the nebulizer and albuterol inhaler while waiting on medication. [facility] does not have any samples at this time. But the [place] office does and will put a sample aside for the patient. Wife verbalized understanding. I did explain to her if Robert Rich is taking the albuterol multiple times a day once back on the Anoro she needs to let us know because that still means it isn't working. But patient believe the Anoro helps and they got approved for a year.   Will route to Dr. Sherene Sires as Lorain Childes

## 2019-10-27 NOTE — Telephone Encounter (Signed)
  If the albuterol inhaler is not working under his previous "plan B" in the instructions he should be using Plan C up to every 4 hours and if even that's not working then and sob at rest then go back to Er.   Plan B should be pure albuterol and not the combination product and should not be taking the atrovent by itself at all because anoro has the atrovent already build in it (so does trelegy)  Also needs to change anoro to Trelegy one click  each am  When time to refill (won't make any immediate difference but no reason to refill anoro as he's clearly failing it)

## 2019-10-27 NOTE — Telephone Encounter (Signed)
Attempted to call pt's spouse but unable to reach. Left message for her to return call. 

## 2019-10-27 NOTE — Telephone Encounter (Signed)
ATC Patient left a voicemail for patient to call our office back regarding prior message.

## 2019-10-28 ENCOUNTER — Other Ambulatory Visit: Payer: Self-pay

## 2019-10-28 ENCOUNTER — Other Ambulatory Visit (HOSPITAL_COMMUNITY)
Admission: RE | Admit: 2019-10-28 | Discharge: 2019-10-28 | Disposition: A | Discharge status: Home / Self Care | Payer: HRSA Program | Source: Ambulatory Visit | Attending: Internal Medicine | Admitting: Internal Medicine

## 2019-10-28 DIAGNOSIS — Z01812 Encounter for preprocedural laboratory examination: Secondary | ICD-10-CM | POA: Insufficient documentation

## 2019-10-28 DIAGNOSIS — Z20822 Contact with and (suspected) exposure to covid-19: Secondary | ICD-10-CM | POA: Insufficient documentation

## 2019-10-28 LAB — SARS CORONAVIRUS 2 (TAT 6-24 HRS): SARS Coronavirus 2: NEGATIVE

## 2019-11-01 ENCOUNTER — Emergency Department (HOSPITAL_COMMUNITY): Payer: Medicaid Other

## 2019-11-01 ENCOUNTER — Ambulatory Visit (HOSPITAL_COMMUNITY)
Admission: RE | Admit: 2019-11-01 | Discharge: 2019-11-01 | Disposition: A | Discharge status: Home / Self Care | Payer: Medicaid Other | Source: Ambulatory Visit | Attending: Internal Medicine | Admitting: Internal Medicine

## 2019-11-01 ENCOUNTER — Encounter (HOSPITAL_COMMUNITY): Payer: Self-pay

## 2019-11-01 ENCOUNTER — Emergency Department (HOSPITAL_COMMUNITY)
Admission: EM | Admit: 2019-11-01 | Discharge: 2019-11-01 | Disposition: A | Discharge status: Home / Self Care | Payer: Medicaid Other | Attending: Emergency Medicine | Admitting: Emergency Medicine

## 2019-11-01 ENCOUNTER — Other Ambulatory Visit: Payer: Self-pay

## 2019-11-01 DIAGNOSIS — I2510 Atherosclerotic heart disease of native coronary artery without angina pectoris: Secondary | ICD-10-CM | POA: Insufficient documentation

## 2019-11-01 DIAGNOSIS — J441 Chronic obstructive pulmonary disease with (acute) exacerbation: Secondary | ICD-10-CM

## 2019-11-01 DIAGNOSIS — R202 Paresthesia of skin: Secondary | ICD-10-CM | POA: Insufficient documentation

## 2019-11-01 DIAGNOSIS — Z79899 Other long term (current) drug therapy: Secondary | ICD-10-CM | POA: Insufficient documentation

## 2019-11-01 DIAGNOSIS — R0602 Shortness of breath: Secondary | ICD-10-CM | POA: Insufficient documentation

## 2019-11-01 DIAGNOSIS — E785 Hyperlipidemia, unspecified: Secondary | ICD-10-CM | POA: Insufficient documentation

## 2019-11-01 DIAGNOSIS — Z794 Long term (current) use of insulin: Secondary | ICD-10-CM | POA: Insufficient documentation

## 2019-11-01 DIAGNOSIS — E1165 Type 2 diabetes mellitus with hyperglycemia: Secondary | ICD-10-CM | POA: Insufficient documentation

## 2019-11-01 DIAGNOSIS — R200 Anesthesia of skin: Secondary | ICD-10-CM

## 2019-11-01 DIAGNOSIS — Z7951 Long term (current) use of inhaled steroids: Secondary | ICD-10-CM | POA: Insufficient documentation

## 2019-11-01 DIAGNOSIS — Z20822 Contact with and (suspected) exposure to covid-19: Secondary | ICD-10-CM | POA: Insufficient documentation

## 2019-11-01 DIAGNOSIS — Z87891 Personal history of nicotine dependence: Secondary | ICD-10-CM | POA: Diagnosis not present

## 2019-11-01 DIAGNOSIS — E1169 Type 2 diabetes mellitus with other specified complication: Secondary | ICD-10-CM | POA: Diagnosis not present

## 2019-11-01 DIAGNOSIS — Z8616 Personal history of COVID-19: Secondary | ICD-10-CM | POA: Diagnosis not present

## 2019-11-01 LAB — CBC
HCT: 37 % — ABNORMAL LOW (ref 39.0–52.0)
Hemoglobin: 12.2 g/dL — ABNORMAL LOW (ref 13.0–17.0)
MCH: 28.4 pg (ref 26.0–34.0)
MCHC: 33 g/dL (ref 30.0–36.0)
MCV: 86 fL (ref 80.0–100.0)
Platelets: 437 10*3/uL — ABNORMAL HIGH (ref 150–400)
RBC: 4.3 MIL/uL (ref 4.22–5.81)
RDW: 14.1 % (ref 11.5–15.5)
WBC: 15 10*3/uL — ABNORMAL HIGH (ref 4.0–10.5)
nRBC: 0 % (ref 0.0–0.2)

## 2019-11-01 LAB — URINALYSIS, ROUTINE W REFLEX MICROSCOPIC
Bilirubin Urine: NEGATIVE
Glucose, UA: NEGATIVE mg/dL
Hgb urine dipstick: NEGATIVE
Ketones, ur: NEGATIVE mg/dL
Leukocytes,Ua: NEGATIVE
Nitrite: NEGATIVE
Protein, ur: NEGATIVE mg/dL
Specific Gravity, Urine: 1.023 (ref 1.005–1.030)
pH: 5 (ref 5.0–8.0)

## 2019-11-01 LAB — COMPREHENSIVE METABOLIC PANEL
ALT: 24 U/L (ref 0–44)
AST: 17 U/L (ref 15–41)
Albumin: 3.8 g/dL (ref 3.5–5.0)
Alkaline Phosphatase: 33 U/L — ABNORMAL LOW (ref 38–126)
Anion gap: 11 (ref 5–15)
BUN: 17 mg/dL (ref 6–20)
CO2: 27 mmol/L (ref 22–32)
Calcium: 8.7 mg/dL — ABNORMAL LOW (ref 8.9–10.3)
Chloride: 99 mmol/L (ref 98–111)
Creatinine, Ser: 0.66 mg/dL (ref 0.61–1.24)
GFR calc non Af Amer: >60 mL/min (ref >60)
Glucose, Bld: 170 mg/dL — ABNORMAL HIGH (ref 70–99)
Potassium: 3.9 mmol/L (ref 3.5–5.1)
Sodium: 137 mmol/L (ref 135–145)
Total Bilirubin: 0.9 mg/dL (ref 0.3–1.2)
Total Protein: 6.4 g/dL — ABNORMAL LOW (ref 6.5–8.1)

## 2019-11-01 LAB — DIFFERENTIAL
Abs Immature Granulocytes: 0.07 10*3/uL (ref 0.00–0.07)
Basophils Absolute: 0.1 10*3/uL (ref 0.0–0.1)
Basophils Relative: 1 %
Eosinophils Absolute: 0.2 10*3/uL (ref 0.0–0.5)
Eosinophils Relative: 2 %
Immature Granulocytes: 1 %
Lymphocytes Relative: 18 %
Lymphs Abs: 2.8 10*3/uL (ref 0.7–4.0)
Monocytes Absolute: 0.7 10*3/uL (ref 0.1–1.0)
Monocytes Relative: 4 %
Neutro Abs: 11.2 10*3/uL — ABNORMAL HIGH (ref 1.7–7.7)
Neutrophils Relative %: 74 %

## 2019-11-01 LAB — I-STAT CHEM 8, ED
BUN: 17 mg/dL (ref 6–20)
Calcium, Ion: 1.13 mmol/L — ABNORMAL LOW (ref 1.15–1.40)
Chloride: 98 mmol/L (ref 98–111)
Creatinine, Ser: 0.6 mg/dL — ABNORMAL LOW (ref 0.61–1.24)
Glucose, Bld: 169 mg/dL — ABNORMAL HIGH (ref 70–99)
HCT: 38 % — ABNORMAL LOW (ref 39.0–52.0)
Hemoglobin: 12.9 g/dL — ABNORMAL LOW (ref 13.0–17.0)
Potassium: 3.9 mmol/L (ref 3.5–5.1)
Sodium: 140 mmol/L (ref 135–145)
TCO2: 26 mmol/L (ref 22–32)

## 2019-11-01 LAB — PULMONARY FUNCTION TEST
FEF 25-75 Pre: 1.71 L/sec
FEF2575-%Pred-Pre: 53 %
FEV1-%Pred-Pre: 65 %
FEV1-Pre: 2.42 L
FEV1FVC-%Pred-Pre: 93 %
FEV6-%Pred-Pre: 72 %
FEV6-Pre: 3.34 L
FEV6FVC-%Pred-Pre: 103 %
FVC-%Pred-Pre: 69 %
FVC-Pre: 3.36 L
Pre FEV1/FVC ratio: 72 %
Pre FEV6/FVC Ratio: 99 %

## 2019-11-01 LAB — RESP PANEL BY RT PCR (RSV, FLU A&B, COVID)
Influenza A by PCR: NEGATIVE
Influenza B by PCR: NEGATIVE
Respiratory Syncytial Virus by PCR: NEGATIVE
SARS Coronavirus 2 by RT PCR: NEGATIVE

## 2019-11-01 LAB — APTT: aPTT: 24 seconds (ref 24–36)

## 2019-11-01 LAB — PROTIME-INR
INR: 1.1 (ref 0.8–1.2)
Prothrombin Time: 13.8 seconds (ref 11.4–15.2)

## 2019-11-01 LAB — RAPID URINE DRUG SCREEN, HOSP PERFORMED
Amphetamines: NOT DETECTED
Barbiturates: NOT DETECTED
Benzodiazepines: NOT DETECTED
Cocaine: NOT DETECTED
Opiates: NOT DETECTED
Tetrahydrocannabinol: NOT DETECTED

## 2019-11-01 LAB — D-DIMER, QUANTITATIVE: D-Dimer, Quant: <0.27 ug/mL-FEU (ref 0.00–0.50)

## 2019-11-01 MED ORDER — PREDNISONE 20 MG PO TABS: 40.0000 mg | ORAL_TABLET | Freq: Every day | ORAL | 0 refills | Status: DC

## 2019-11-01 MED ORDER — DEXAMETHASONE SODIUM PHOSPHATE 10 MG/ML IJ SOLN
10.0000 mg | Freq: Once | INTRAMUSCULAR | Status: AC
  Administered 2019-11-01: 10 mg via INTRAVENOUS
  Filled 2019-11-01: qty 1

## 2019-11-01 MED ORDER — ALBUTEROL SULFATE (2.5 MG/3ML) 0.083% IN NEBU
2.5000 mg | INHALATION_SOLUTION | Freq: Once | RESPIRATORY_TRACT | Status: AC
  Administered 2019-11-01: 2.5 mg via RESPIRATORY_TRACT

## 2019-11-01 MED ORDER — SODIUM CHLORIDE 0.9 % IV BOLUS
500.0000 mL | Freq: Once | INTRAVENOUS | Status: AC
  Administered 2019-11-01: 500 mL via INTRAVENOUS

## 2019-11-01 MED ORDER — SODIUM CHLORIDE 0.9 % IV SOLN
100.0000 mL/h | INTRAVENOUS | Status: DC
  Administered 2019-11-01: 100 mL/h via INTRAVENOUS

## 2019-11-01 NOTE — Discharge Instructions (Signed)
As discussed, your evaluation today has been largely reassuring.  But, it is important that you monitor your condition carefully, and do not hesitate to return to the ED if you develop new, or concerning changes in your condition. ? ?Otherwise, please follow-up with your physician for appropriate ongoing care. ? ?

## 2019-11-01 NOTE — ED Provider Notes (Signed)
[facility] EMERGENCY DEPARTMENT Provider Note   CSN: [id number] Arrival date & time: 11/01/19  1033     History No chief complaint on file.   Robert Rich is a 54 y.o. male.  HPI    Patient presents with concern of new numbness, heaviness. 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 He was in pulmonary function testing today, when after performing one test, requiring tachypnea, he felt numb, weak, out of sorts. After a brief respite, and one albuterol session he again tried to perform the test. He soon thereafter developed numbness in his extremities, seemingly left greater than right, though in both distal lower extremities. No syncope, no vomiting. With persistent symptoms, numbness, tingling, he was sent here for evaluation from outpatient testing. Patient denies pain, acknowledges history of CAD, is a former smoker, drinker, and as above has a history of prior chemical exposure.   Past Medical History:  Diagnosis Date  . COPD (chronic obstructive pulmonary disease) (HCC)   . Coronary artery disease   . DM (diabetes mellitus) (HCC)   . Heart attack (HCC)   . HLD (hyperlipidemia)   . HTN (hypertension)   . Obesity   . Stroke [facility])     Patient Active Problem List   Diagnosis Date Noted  . COPD GOLD 0/  group B vs D  08/24/2019  . Multiple pulmonary nodules determined by computed tomography of lung 08/24/2019  . COPD exacerbation (HCC)   . COPD with acute exacerbation (HCC) 08/04/2019  . Coronary artery disease   . HLD (hyperlipidemia)   . Leukocytosis   . Hyperglycemia   . Steroid-induced hyperglycemia   . Acute respiratory failure with hypoxia (HCC)   . Class 1 obesity due to excess calories with serious comorbidity and body mass index (BMI) of 32.0 to 32.9 in adult   . HTN (hypertension)   . Anxiety   . Substance abuse (HCC)   . Cardiac arrest (HCC) 03/17/2019  . 2019 novel coronavirus disease (COVID-19) 12/07/2018  .  Tobacco abuse 12/06/2018    Past Surgical History:  Procedure Laterality Date  . INTRAVASCULAR PRESSURE WIRE/FFR STUDY N/A 03/18/2019   Procedure: INTRAVASCULAR PRESSURE WIRE/FFR STUDY;  Surgeon: [name], MD;  Location: [facility] INVASIVE CV LAB;  Service: Cardiovascular;  Laterality: N/A;  . LEFT HEART CATH AND CORONARY ANGIOGRAPHY N/A 03/18/2019   Procedure: LEFT HEART CATH AND CORONARY ANGIOGRAPHY;  Surgeon: [name], MD;  Location: [facility] INVASIVE CV LAB;  Service: Cardiovascular;  Laterality: N/A;  . NO PAST SURGERIES         Family History  Problem Relation Age of Onset  . Kidney disease Father   . Heart disease Mother   . Heart failure Mother   . Atrial fibrillation Mother     Social History   Tobacco Use  . Smoking status: Former Smoker    Packs/day: 0.10    Years: 20.00    Pack years: 2.00    Types: Cigarettes    Quit date: 03/17/2019    Years since quitting: 0.6  . Smokeless tobacco: Never Used  . Tobacco comment: Quit in feb of 2021  Vaping Use  . Vaping Use: Never used  Substance Use Topics  . Alcohol use: Not Currently    Alcohol/week: 12.0 standard drinks    Types: 12 Cans of beer per week    Comment: Quit in Feb 2021  . Drug use: Not Currently    Home Medications Prior to Admission medications  Medication Sig Start Date End Date Taking? Authorizing Provider  albuterol (PROVENTIL) (2.5 MG/3ML) 0.083% nebulizer solution Take 3 mLs (2.5 mg total) by nebulization every 4 (four) hours as needed for wheezing or shortness of breath. 10/25/19   [name], MD  ALPRAZolam Prudy Feeler) 0.5 MG tablet Take 1 tablet (0.5 mg total) by mouth every 12 (twelve) hours as needed for anxiety. Patient not taking: Reported on 10/25/2019 07/22/19   [name], MD  atorvastatin (LIPITOR) 40 MG tablet Take 1 tablet (40 mg total) by mouth daily at 6 PM. 07/22/19   [name], MD  butalbital-acetaminophen-caffeine (FIORICET) [phone] MG tablet Take 1 tablet by mouth every 6 (six) hours  as needed for migraine. 07/22/19   [name], MD  clopidogrel (PLAVIX) 75 MG tablet Take 1 tablet (75 mg total) by mouth daily. 07/22/19   [name], MD  folic acid (FOLVITE) 1 MG tablet Take 1 tablet (1 mg total) by mouth daily. 07/22/19   [name], MD  insulin NPH-regular Human (NOVOLIN 70/30) (70-30) 100 UNIT/ML injection Inject 50 Units into the skin 2 (two) times daily with a meal. 08/11/19   [name], [name], MD  ipratropium (ATROVENT) 0.02 % nebulizer solution Take 2.5 mLs (0.5 mg total) by nebulization 4 (four) times daily. 10/25/19   [name], MD  ipratropium-albuterol (DUONEB) 0.5-2.5 (3) MG/3ML SOLN Inhale 3 mLs into the lungs 4 (four) times daily as needed. 10/25/19   [provider]  losartan (COZAAR) 25 MG tablet Take 1 tablet (25 mg total) by mouth daily. 04/18/19 10/25/19  [name], MD  metFORMIN (GLUCOPHAGE) 1000 MG tablet Take 1 tablet (1,000 mg total) by mouth 2 (two) times daily with a meal. 07/22/19   [name], MD  metoprolol succinate (TOPROL-XL) 25 MG 24 hr tablet Take 1 tablet (25 mg total) by mouth daily. 07/22/19   [name], MD  montelukast (SINGULAIR) 10 MG tablet Take 1 tablet (10 mg total) by mouth at bedtime. 08/11/19   [name], MD  Multiple Vitamin (MULTIVITAMIN WITH MINERALS) TABS tablet Take 1 tablet by mouth daily. 07/23/19   [name], MD  predniSONE (STERAPRED UNI-PAK 21 TAB) 10 MG (21) TBPK tablet Take 6 tablets (60 mg total) by mouth daily for 2 days, THEN 5 tablets (50 mg total) daily for 2 days, THEN 4 tablets (40 mg total) daily for 2 days, THEN 3 tablets (30 mg total) daily for 2 days, THEN 2 tablets (20 mg total) daily for 2 days, THEN 1 tablet (10 mg total) daily for 2 days. 10/25/19 11/06/19  [name], MD  thiamine 100 MG tablet Take 2.5 tablets (250 mg total) by mouth daily. 07/22/19   [name], MD  umeclidinium-vilanterol [facility] ELLIPTA) 62.5-25 MCG/INH AEPB Inhale 1 puff into the lungs daily. 10/17/19 01/15/20  [name], MD  umeclidinium-vilanterol (ANORO ELLIPTA) 62.5-25 MCG/INH AEPB Inhale 1 puff into the lungs daily. Patient not taking: Reported on 10/25/2019 10/17/19   [name], MD  umeclidinium-vilanterol [facility] ELLIPTA) 62.5-25 MCG/INH AEPB Inhale 1 puff into the lungs daily. 10/27/19   [name], MD  Vitamin D, Ergocalciferol, (DRISDOL) 1.25 MG (50000 UNIT) CAPS capsule Take 1 capsule (50,000 Units total) by mouth every 7 (seven) days. Patient not taking: Reported on 10/25/2019 08/12/19   [name], MD    Allergies    Asa [aspirin] and Zolmitriptan  Review of Systems   Review of Systems  Constitutional:       Per HPI, otherwise negative  HENT:       Per HPI, otherwise negative  Respiratory:       Per HPI, otherwise negative  Cardiovascular:       Per HPI, otherwise negative  Gastrointestinal: Negative for vomiting.  Endocrine:       Negative aside from HPI  Genitourinary:       Neg aside from HPI   Musculoskeletal:       Per HPI, otherwise negative  Skin: Negative.   Neurological: Positive for numbness. Negative for syncope.    Physical Exam Updated Vital Signs BP 122/66 (BP Location: Left Arm)   Pulse 64   Temp 98.5 F (36.9 C) (Oral)   Resp 16   SpO2 97%   Physical Exam Vitals and nursing note reviewed.  Constitutional:      General: He is not in acute distress.    Appearance: He is well-developed.  HENT:     Head: Normocephalic and atraumatic.  Eyes:     Conjunctiva/sclera: Conjunctivae normal.  Neck:     Comments: Patient denies any neck pain, moves his neck about in an animated fashion. Cardiovascular:     Rate and Rhythm: Normal rate and regular rhythm.  Pulmonary:     Effort: Pulmonary effort is normal. No respiratory distress.     Breath sounds: No stridor.  Abdominal:     General: There is no distension.  Skin:    General: Skin is warm and dry.  Neurological:     Mental Status: He is alert and oriented to person, place, and time.      Comments: Numbness described, of subjective, not objective in the distal extremities. Line patient states that he has some difficulty moving his left leg, but does so, though with pain, soreness. Face is symmetric, speech is clear.     ED Results / Procedures / Treatments   Labs (all labs ordered are listed, but only abnormal results are displayed) Labs Reviewed  CBC - Abnormal; Notable for the following components:      Result Value   WBC 15.0 (*)    Hemoglobin 12.2 (*)    HCT 37.0 (*)    Platelets 437 (*)    All other components within normal limits  DIFFERENTIAL - Abnormal; Notable for the following components:   Neutro Abs 11.2 (*)    All other components within normal limits  COMPREHENSIVE METABOLIC PANEL - Abnormal; Notable for the following components:   Glucose, Bld 170 (*)    Calcium 8.7 (*)    Total Protein 6.4 (*)    Alkaline Phosphatase 33 (*)    All other components within normal limits  I-STAT CHEM 8, ED - Abnormal; Notable for the following components:   Creatinine, Ser 0.60 (*)    Glucose, Bld 169 (*)    Calcium, Ion 1.13 (*)    Hemoglobin 12.9 (*)    HCT 38.0 (*)    All other components within normal limits  RESP PANEL BY RT PCR (RSV, FLU A&B, COVID)  PROTIME-INR  APTT  RAPID URINE DRUG SCREEN, HOSP PERFORMED  URINALYSIS, ROUTINE W REFLEX MICROSCOPIC  D-DIMER, QUANTITATIVE ([facility])    EKG EKG Interpretation  Date/Time:  Tuesday November 01 2019 10:35:53 EDT Ventricular Rate:  71 PR Interval:    QRS Duration: 112 QT Interval:  412 QTC Calculation: 448 R Axis:   149 Text Interpretation: Right and left arm electrode reversal, interpretation assumes no reversal Sinus rhythm Borderline intraventricular conduction delay T wave abnormality Abnormal ECG  Confirmed by Gerhard Munch [phone]) on 11/01/2019 11:59:08 AM   Radiology DG Chest 1 View  Result Date: 11/01/2019 CLINICAL DATA:  Shortness of breath EXAM: CHEST  1 VIEW COMPARISON:  October 25, 2019 FINDINGS: Lungs are clear. Heart size and pulmonary vascularity are normal. No adenopathy. No bone lesions. IMPRESSION: Lungs clear.  Cardiac silhouette normal. Electronically Signed   By: Bretta Bang III M.D.   On: 11/01/2019 11:32   CT HEAD WO CONTRAST  Result Date: 11/01/2019 CLINICAL DATA:  Numbness in LEFT arm unlike EXAM: CT HEAD WITHOUT CONTRAST TECHNIQUE: Contiguous axial images were obtained from the base of the skull through the vertex without intravenous contrast. COMPARISON:  March 17, 2019 FINDINGS: Brain: No evidence of acute infarction, hemorrhage, hydrocephalus, extra-axial collection or mass lesion/mass effect. Unchanged appearance of the RIGHT cerebellum, consistent with sequela of remote prior infarction. Vascular: No hyperdense vessel or unexpected calcification. Skull: Normal. Negative for fracture or focal lesion. Sinuses/Orbits: No acute finding. Mild mucosal thickening in the RIGHT maxillary sinus. Other: None. IMPRESSION: 1.  No acute intracranial abnormality. Electronically Signed   By: Meda Klinefelter MD   On: 11/01/2019 11:22    Procedures Procedures (including critical care time)  Medications Ordered in ED Medications  sodium chloride 0.9 % bolus 500 mL (has no administration in time range)    Followed by  0.9 %  sodium chloride infusion (has no administration in time range)    ED Course  I have reviewed the triage vital signs and the nursing notes.  Pertinent labs & imaging results that were available during my care of the patient were reviewed by me and considered in my medical decision making (see chart for details).  On repeat exam patient is in similar condition. He now notes that he has numbness in his right toes, left finger. With facial asymmetry, no difficulty speaking.  Update:, Patient now accompanied by his wife. We discussed all findings again, reassuring CT, x-ray, labs. Patient remains awake, alert, hemodynamically unremarkable, no  oxygen requirement, no objective neurologic deficiencies. Some suspicion for the patient's symptoms being secondary to testing with metabolic strain secondary to his PFT exam. No evidence for pneumonia, patient has subjective, objective neuro deficits, no evidence for stroke. Patient appropriate for close outpatient follow-up.  Final Clinical Impression(s) / ED Diagnoses Final diagnoses:  SOB (shortness of breath)  Numbness    Rx / DC Orders ED Discharge Orders         Ordered    predniSONE (DELTASONE) 20 MG tablet  Daily with breakfast        11/01/19 1523           Gerhard Munch, MD 11/01/19 1525

## 2019-11-01 NOTE — ED Notes (Signed)
Dr. Jeraldine Loots in room assessing patient at this time.

## 2019-11-01 NOTE — ED Notes (Signed)
ED Provider at bedside. 

## 2019-11-01 NOTE — Progress Notes (Signed)
Patent in today for PFT study.  Only FVC and SVC potion of the study done when patient c/o being really sob. Called Dr. Sherene Sires to inform that I would be giving neb tx before completing the Pleth and DLCO portions of the study.   Unable to complete the study at this time.  Pt c/o numbness of left arm and leg after the neb tx.  Rapid Response Team called and patient was taken to Emergency Dept for further evaluation.  Patient placed on 2 lpm nasal cannula Oxygen saturations  97% on 2 lpm  Hr 71.

## 2019-11-01 NOTE — ED Notes (Signed)
Pt aware we need urine sample, urinal at bedside.  

## 2019-11-01 NOTE — ED Notes (Signed)
Family at bedside. 

## 2019-11-03 ENCOUNTER — Telehealth: Payer: Self-pay | Admitting: Internal Medicine

## 2019-11-03 NOTE — Telephone Encounter (Signed)
Spoke with pt's mother, Sedalia Muta. States that pt's PCP wanted them to call and see if MW wanted pt to continue Metoprolol. PCP seems to think this medication could effect his lungs.  MW - please advise. Thanks.

## 2019-11-03 NOTE — Telephone Encounter (Signed)
In low doses (which is what he is listed as taking) it should be fine - be sure to keep upcoming appt and we'll regroup then

## 2019-11-03 NOTE — Telephone Encounter (Signed)
Spoke with pt's mother. She is aware of MW's response. Nothing further was needed.

## 2019-11-10 NOTE — Progress Notes (Signed)
Called and spoke with patient about PFT results per Dr Sherene Sires. All questions answered and patient expressed full understanding of results and to continue using Anoro per Dr Sherene Sires. Confirmed office visit already scheduled on 11/29/2019. Nothing further needed at this time.

## 2019-11-18 ENCOUNTER — Ambulatory Visit: Payer: Self-pay | Admitting: Internal Medicine

## 2019-11-29 ENCOUNTER — Other Ambulatory Visit: Payer: Self-pay

## 2019-11-29 ENCOUNTER — Ambulatory Visit (INDEPENDENT_AMBULATORY_CARE_PROVIDER_SITE_OTHER): Payer: Self-pay | Admitting: Internal Medicine

## 2019-11-29 ENCOUNTER — Encounter: Payer: Self-pay | Admitting: Internal Medicine

## 2019-11-29 DIAGNOSIS — J449 Chronic obstructive pulmonary disease, unspecified: Secondary | ICD-10-CM

## 2019-11-29 NOTE — Assessment & Plan Note (Signed)
Complicated by dm/ hyperlipidemia  Body mass index is 36.68 kg/m.  -  trending down slightly, encouraged Lab Results  Component Value Date   TSH 0.487 03/18/2019     Contributing to doe plus also has gerd risk reviewed the need and the process to achieve and maintain neg calorie balance > defer f/u primary care including intermittently monitoring thyroid status

## 2019-11-29 NOTE — Assessment & Plan Note (Signed)
Quit smoking 02/2019 -  Spirometry only on 11/01/19  No obst p anoro prior to study and minimal concavity to f/v loop   Pt is Group B in terms of symptom/risk and laba/lama therefore appropriate rx at this point >>>  anoro for now as has secured 1 year supply already but when returns in one year may try off as full benefits of smoking abstinence may be adequate to do just as well without maint rx         Each maintenance medication was reviewed in detail including emphasizing most importantly the difference between maintenance and prns and under what circumstances the prns are to be triggered using an action plan format where appropriate.  Total time for H and P, chart review, counseling, teaching device and generating customized AVS unique to this office visit / charting = 20 min

## 2019-11-29 NOTE — Patient Instructions (Addendum)
No change in recommendations     To get the most out of exercise, you need to be continuously aware that you are short of breath, but never out of breath, for 30 minutes daily. As you improve, it will actually be easier for you to do the same amount of exercise  in  30 minutes so always push to the level where you are short of breath.      Please schedule a follow up visit in 12 months but call sooner if needed.

## 2019-11-29 NOTE — Progress Notes (Signed)
Robert Rich, male    DOB: 06-Nov-1965     MRN: [id number]   Brief patient profile:  54 yowm body shop owner  quit smoking 2019/04/08 at wt [employer]  progressive sob x 2011 and required neb x 2020 and then admitted with IHD/ sudden death 04/08/2019 and back to baseline  then admit with aecopd:    Admit date: 08/04/2019 Discharge date: 08/11/2019  Brief/Interim Summary: 54 y.o.maleformer smokerwith medical history significantforsevere COPD, coronary artery disease, type 2 diabetes mellitus, dyslipidemia, alcohol use disorder and substance abuse with benzodiazepines and history of prior cocaine use was recently hospitalized discharged 2 weeks ago for a COPD exacerbation. He reports that he felt well after going home but when he ran out of steroids and antibiotics his symptoms slowly began to return over the course of the last 3 days. He became so severely short of breath today that he called EMS. He says that he had return to work last week and was working short hours up until 3 days ago when he had increased his work hours and noticed that his shortness of breath became acutely worse. He reports significant wheezing and increased work of breathing coughing and chest discomfort. He denies chest pain. He reports productive cough with yellow sputum. He can only walk a few feet without severe shortness of breath.  Discharge Diagnoses:   Acute on chronic respiratory failure with hypoxia  -secondary to severe COPD exacerbation --symptoms rapidly returned after he completed his steroid taper and antibiotics from recent hospitalization. -He has an outpatient pulmonary appt later this month but I requested inpatient consult given multiple admissions and ED visits. -appreciate pulmonary follow up-->wean prednisone over 2 weeks  COPDExacerbation -continue duonebs -de-escalate to po prednisone - Appreciate pulmonary consult. Discussed with Dr. Halina Rich prednisone over next 2 weeks   -Unfortunately patient could not tolerate brovana neb treatments. -Continue Duonebs, continue pulmicort nebs -Pulmonary willl see him again on outpatient follow up 08/24/19. -They will arrange PFTs. -He was given lasix 20 mg IV daily x 4total doses during the hospitalization.   CAD  - stable, no chest pain symptoms, resumed his home heart medications.   Tobacco abuse - He reports he quit several months ago.  Polysubstance abuse  - UDS positive for benzo, opioid and barbituates.  Anxiety -alprazolam as needed only for severe symptoms.  Type 2 DM with steroid induced hyperglycemia -increased doses of insulin and follow closely especially while on IV steroids.  -Pt will need to DC home on insulin. He has used insulin before. He prefers vials and syringes. He will be on steroids for at least 2 weeks after discharge and will need insulin plus metformin to help control his steroid induced hyperglycemia -d/c home with novolin 70/30--50 units bid -instructed pt to continue checking CBGs ac/hs      overall 50% improved at d/c on prednisone tapered off 08/20/19 and about the same as at d/c but no longer using neb / maint on Anoro      History of Present Illness  08/24/2019  Pulmonary/ 1st office eval/ Robert Rich / [place] [facility]  Chief Complaint  Patient presents with   Follow-up    shortness of breath with exertion  Dyspnea:  MMRC3 = can't walk 100 yards even at a slow pace at a flat grade s stopping due to sob   Cough: resolved  Sleep: flat  SABA use: combivent and anoro both in am then no other saba  rec Plan A = Automatic =  Always=   Anoro one click take two drags Plan B = Backup (to supplement plan A, not to replace it) Only use your albuterol inhaler as a rescue medication Try albuterol 15 min before an activity that you know would make you short of breath     Plan C = Crisis (instead of Plan B but only if Plan B stops working) - only use your albuterol  nebulizer if you first try Plan B  Make sure you check your oxygen saturations at highest level of activity  Please schedule a follow up visit in 3 months with PFTs  but call sooner if needed  - ok to delay if you haven't got your insurance.    11/29/2019  f/u ov/Robert Rich re: GOLD 0 on anoro with resolution of cough and doe improved Chief Complaint  Patient presents with   Follow-up    NO complaints  Dyspnea:  MMRC1 = can walk nl pace, flat grade, can't hurry or go uphills or steps s sob   Cough: none  Sleeping: able to lie down flat 2 pillows SABA use: none at all  02: none    No obvious day to day or daytime variability or assoc excess/ purulent sputum or mucus plugs or hemoptysis or cp or chest tightness, subjective wheeze or overt sinus or hb symptoms.   Sleeping  without nocturnal  or early am exacerbation  of respiratory  c/o's or need for noct saba. Also denies any obvious fluctuation of symptoms with weather or environmental changes or other aggravating or alleviating factors except as outlined above   No unusual exposure hx or h/o childhood pna/ asthma or knowledge of premature birth.  Current Allergies, Complete Past Medical History, Past Surgical History, Family History, and Social History were reviewed in [employer] record.  ROS  The following are not active complaints unless bolded Hoarseness, sore throat, dysphagia, dental problems, itching, sneezing,  nasal congestion or discharge of excess mucus or purulent secretions, ear ache,   fever, chills, sweats, unintended wt loss or wt gain, classically pleuritic or exertional cp,  orthopnea pnd or arm/hand swelling  or leg swelling, presyncope, palpitations, abdominal pain, anorexia, nausea, vomiting, diarrhea  or change in bowel habits or change in bladder habits, change in stools or change in urine, dysuria, hematuria,  rash, arthralgias, visual complaints, headache, numbness, weakness or ataxia or problems  with walking or coordination,  change in mood or  memory.        Current Meds - - NOTE:   Unable to verify as accurately reflecting what pt takes     Medication Sig   albuterol (PROVENTIL) (2.5 MG/3ML) 0.083% nebulizer solution Take 3 mLs (2.5 mg total) by nebulization every 4 (four) hours as needed for wheezing or shortness of breath.   atorvastatin (LIPITOR) 40 MG tablet Take 1 tablet (40 mg total) by mouth daily at 6 PM.   butalbital-acetaminophen-caffeine (FIORICET) 50-325-40 MG tablet Take 1 tablet by mouth every 6 (six) hours as needed for migraine.   clopidogrel (PLAVIX) 75 MG tablet Take 1 tablet (75 mg total) by mouth daily.   folic acid (FOLVITE) 1 MG tablet Take 1 tablet (1 mg total) by mouth daily.   insulin NPH-regular Human (NOVOLIN 70/30) (70-30) 100 UNIT/ML injection Inject 50 Units into the skin 2 (two) times daily with a meal.   ipratropium (ATROVENT) 0.02 % nebulizer solution Take 2.5 mLs (0.5 mg total) by nebulization 4 (four) times daily.   ipratropium-albuterol (DUONEB) 0.5-2.5 (3)  MG/3ML SOLN Inhale 3 mLs into the lungs 4 (four) times daily as needed.   metFORMIN (GLUCOPHAGE) 1000 MG tablet Take 1 tablet (1,000 mg total) by mouth 2 (two) times daily with a meal.   metoprolol succinate (TOPROL-XL) 25 MG 24 hr tablet Take 1 tablet (25 mg total) by mouth daily.   montelukast (SINGULAIR) 10 MG tablet Take 1 tablet (10 mg total) by mouth at bedtime.   Multiple Vitamin (MULTIVITAMIN WITH MINERALS) TABS tablet Take 1 tablet by mouth daily.   predniSONE (DELTASONE) 20 MG tablet Take 2 tablets (40 mg total) by mouth daily with breakfast. For the next four days   thiamine 100 MG tablet Take 2.5 tablets (250 mg total) by mouth daily. (Patient taking differently: Take 100 mg by mouth daily. )   umeclidinium-vilanterol (ANORO ELLIPTA) 62.5-25 MCG/INH AEPB Inhale 1 puff into the lungs daily.   Vitamin D, Ergocalciferol, (DRISDOL) 1.25 MG (50000 UNIT) CAPS capsule Take 1  capsule (50,000 Units total) by mouth every 7 (seven) days.               Past Medical History:  Diagnosis Date   COPD (chronic obstructive pulmonary disease) (HCC)    Coronary artery disease    DM (diabetes mellitus) (HCC)    Heart attack (HCC)    HLD (hyperlipidemia)    HTN (hypertension)    Obesity    Stroke (HCC)          Objective:     amb obese wm nad / mild pseudowheeze   Wt Readings from Last 3 Encounters:  11/29/19 248 lb 6.4 oz (112.7 kg)  11/01/19 250 lb (113.4 kg)  10/25/19 250 lb (113.4 kg)     Vital signs reviewed - Note on arrival 11/29/2019  02 sats  96% on RA     HEENT : pt wearing mask not removed for exam due to covid - 19 concerns.   NECK :  without JVD/Nodes/TM/ nl carotid upstrokes bilaterally   LUNGS: no acc muscle use,  Min barrel  contour chest wall with bilateral  slightly decreased bs s audible wheeze and  without cough on insp or exp maneuvers and min  Hyperresonant  to  percussion bilaterally     CV:  RRR  no s3 or murmur or increase in P2, and no edema   ABD:  soft and nontender with pos end  insp Hoover's  in the supine position. No bruits or organomegaly appreciated, bowel sounds nl  MS:   Nl gait/  ext warm without deformities, calf tenderness, cyanosis or clubbing No obvious joint restrictions   SKIN: warm and dry without lesions    NEURO:  alert, approp, nl sensorium with  no motor or cerebellar deficits apparent.             Assessment

## 2019-12-20 ENCOUNTER — Other Ambulatory Visit: Payer: Self-pay

## 2019-12-20 ENCOUNTER — Emergency Department (HOSPITAL_COMMUNITY): Payer: Medicaid Other

## 2019-12-20 ENCOUNTER — Inpatient Hospital Stay (HOSPITAL_COMMUNITY)
Admission: EM | Admit: 2019-12-20 | Discharge: 2019-12-24 | DRG: 190 | Disposition: A | Discharge status: Home / Self Care | Payer: Medicaid Other | Attending: Family Medicine | Admitting: Family Medicine

## 2019-12-20 ENCOUNTER — Encounter (HOSPITAL_COMMUNITY): Payer: Self-pay

## 2019-12-20 ENCOUNTER — Observation Stay (HOSPITAL_COMMUNITY): Payer: Medicaid Other

## 2019-12-20 DIAGNOSIS — D75839 Thrombocytosis, unspecified: Secondary | ICD-10-CM | POA: Diagnosis present

## 2019-12-20 DIAGNOSIS — Z6829 Body mass index (BMI) 29.0-29.9, adult: Secondary | ICD-10-CM

## 2019-12-20 DIAGNOSIS — I2510 Atherosclerotic heart disease of native coronary artery without angina pectoris: Secondary | ICD-10-CM | POA: Diagnosis present

## 2019-12-20 DIAGNOSIS — Z841 Family history of disorders of kidney and ureter: Secondary | ICD-10-CM

## 2019-12-20 DIAGNOSIS — J441 Chronic obstructive pulmonary disease with (acute) exacerbation: Secondary | ICD-10-CM | POA: Diagnosis not present

## 2019-12-20 DIAGNOSIS — Z794 Long term (current) use of insulin: Secondary | ICD-10-CM

## 2019-12-20 DIAGNOSIS — E119 Type 2 diabetes mellitus without complications: Secondary | ICD-10-CM | POA: Diagnosis present

## 2019-12-20 DIAGNOSIS — I252 Old myocardial infarction: Secondary | ICD-10-CM

## 2019-12-20 DIAGNOSIS — Z87891 Personal history of nicotine dependence: Secondary | ICD-10-CM | POA: Diagnosis not present

## 2019-12-20 DIAGNOSIS — Z886 Allergy status to analgesic agent status: Secondary | ICD-10-CM

## 2019-12-20 DIAGNOSIS — Z8249 Family history of ischemic heart disease and other diseases of the circulatory system: Secondary | ICD-10-CM | POA: Diagnosis not present

## 2019-12-20 DIAGNOSIS — E669 Obesity, unspecified: Secondary | ICD-10-CM | POA: Diagnosis present

## 2019-12-20 DIAGNOSIS — J9601 Acute respiratory failure with hypoxia: Secondary | ICD-10-CM | POA: Diagnosis present

## 2019-12-20 DIAGNOSIS — Z7951 Long term (current) use of inhaled steroids: Secondary | ICD-10-CM

## 2019-12-20 DIAGNOSIS — I10 Essential (primary) hypertension: Secondary | ICD-10-CM

## 2019-12-20 DIAGNOSIS — Z7902 Long term (current) use of antithrombotics/antiplatelets: Secondary | ICD-10-CM | POA: Diagnosis not present

## 2019-12-20 DIAGNOSIS — Z20822 Contact with and (suspected) exposure to covid-19: Secondary | ICD-10-CM | POA: Diagnosis present

## 2019-12-20 DIAGNOSIS — I169 Hypertensive crisis, unspecified: Secondary | ICD-10-CM | POA: Diagnosis present

## 2019-12-20 DIAGNOSIS — Z888 Allergy status to other drugs, medicaments and biological substances status: Secondary | ICD-10-CM | POA: Diagnosis not present

## 2019-12-20 DIAGNOSIS — Z8673 Personal history of transient ischemic attack (TIA), and cerebral infarction without residual deficits: Secondary | ICD-10-CM | POA: Diagnosis not present

## 2019-12-20 DIAGNOSIS — Z79899 Other long term (current) drug therapy: Secondary | ICD-10-CM | POA: Diagnosis not present

## 2019-12-20 DIAGNOSIS — E785 Hyperlipidemia, unspecified: Secondary | ICD-10-CM | POA: Diagnosis present

## 2019-12-20 DIAGNOSIS — J449 Chronic obstructive pulmonary disease, unspecified: Secondary | ICD-10-CM | POA: Diagnosis present

## 2019-12-20 DIAGNOSIS — R0602 Shortness of breath: Secondary | ICD-10-CM

## 2019-12-20 LAB — CBC WITH DIFFERENTIAL/PLATELET
Abs Immature Granulocytes: 0.05 10*3/uL (ref 0.00–0.07)
Basophils Absolute: 0.1 10*3/uL (ref 0.0–0.1)
Basophils Relative: 1 %
Eosinophils Absolute: 0.6 10*3/uL — ABNORMAL HIGH (ref 0.0–0.5)
Eosinophils Relative: 4 %
HCT: 41 % (ref 39.0–52.0)
Hemoglobin: 13.4 g/dL (ref 13.0–17.0)
Immature Granulocytes: 0 %
Lymphocytes Relative: 14 %
Lymphs Abs: 2.2 10*3/uL (ref 0.7–4.0)
MCH: 28.3 pg (ref 26.0–34.0)
MCHC: 32.7 g/dL (ref 30.0–36.0)
MCV: 86.7 fL (ref 80.0–100.0)
Monocytes Absolute: 1.1 10*3/uL — ABNORMAL HIGH (ref 0.1–1.0)
Monocytes Relative: 7 %
Neutro Abs: 11.3 10*3/uL — ABNORMAL HIGH (ref 1.7–7.7)
Neutrophils Relative %: 74 %
Platelets: 444 10*3/uL — ABNORMAL HIGH (ref 150–400)
RBC: 4.73 MIL/uL (ref 4.22–5.81)
RDW: 12.9 % (ref 11.5–15.5)
WBC: 15.4 10*3/uL — ABNORMAL HIGH (ref 4.0–10.5)
nRBC: 0 % (ref 0.0–0.2)

## 2019-12-20 LAB — COMPREHENSIVE METABOLIC PANEL
ALT: 19 U/L (ref 0–44)
AST: 15 U/L (ref 15–41)
Albumin: 4.3 g/dL (ref 3.5–5.0)
Alkaline Phosphatase: 53 U/L (ref 38–126)
Anion gap: 11 (ref 5–15)
BUN: 10 mg/dL (ref 6–20)
CO2: 25 mmol/L (ref 22–32)
Calcium: 8.9 mg/dL (ref 8.9–10.3)
Chloride: 100 mmol/L (ref 98–111)
Creatinine, Ser: 0.6 mg/dL — ABNORMAL LOW (ref 0.61–1.24)
GFR, Estimated: >60 mL/min (ref >60)
Glucose, Bld: 105 mg/dL — ABNORMAL HIGH (ref 70–99)
Potassium: 3.9 mmol/L (ref 3.5–5.1)
Sodium: 136 mmol/L (ref 135–145)
Total Bilirubin: 0.2 mg/dL — ABNORMAL LOW (ref 0.3–1.2)
Total Protein: 7.7 g/dL (ref 6.5–8.1)

## 2019-12-20 LAB — BLOOD GAS, VENOUS
Acid-base deficit: 2.5 mmol/L — ABNORMAL HIGH (ref 0.0–2.0)
Bicarbonate: 21.2 mmol/L (ref 20.0–28.0)
FIO2: 94
O2 Saturation: 71 %
Patient temperature: 37
pCO2, Ven: 46.7 mmHg (ref 44.0–60.0)
pH, Ven: 7.31 (ref 7.250–7.430)
pO2, Ven: 42.1 mmHg (ref 32.0–45.0)

## 2019-12-20 LAB — CBG MONITORING, ED: Glucose-Capillary: 223 mg/dL — ABNORMAL HIGH (ref 70–99)

## 2019-12-20 LAB — RESP PANEL BY RT-PCR (FLU A&B, COVID) ARPGX2
Influenza A by PCR: NEGATIVE
Influenza B by PCR: NEGATIVE
SARS Coronavirus 2 by RT PCR: NEGATIVE

## 2019-12-20 LAB — PROCALCITONIN: Procalcitonin: <0.1 ng/mL

## 2019-12-20 LAB — TROPONIN I (HIGH SENSITIVITY)
Troponin I (High Sensitivity): 2 ng/L (ref <18)
Troponin I (High Sensitivity): 2 ng/L (ref <18)

## 2019-12-20 LAB — MAGNESIUM: Magnesium: 1.9 mg/dL (ref 1.7–2.4)

## 2019-12-20 MED ORDER — CLOPIDOGREL BISULFATE 75 MG PO TABS
75.0000 mg | ORAL_TABLET | Freq: Every day | ORAL | Status: DC
  Administered 2019-12-22 – 2019-12-24 (×3): 75 mg via ORAL
  Filled 2019-12-20 (×3): qty 1

## 2019-12-20 MED ORDER — HYDROCOD POLST-CPM POLST ER 10-8 MG/5ML PO SUER: 5.0000 mL | Freq: Every evening | ORAL | Status: DC | PRN

## 2019-12-20 MED ORDER — ATORVASTATIN CALCIUM 40 MG PO TABS
40.0000 mg | ORAL_TABLET | Freq: Every day | ORAL | Status: DC
  Administered 2019-12-21 – 2019-12-23 (×3): 40 mg via ORAL
  Filled 2019-12-20 (×3): qty 1

## 2019-12-20 MED ORDER — MORPHINE SULFATE (PF) 2 MG/ML IV SOLN: 2.0000 mg | INTRAVENOUS | Status: DC | PRN

## 2019-12-20 MED ORDER — IPRATROPIUM BROMIDE 0.02 % IN SOLN: 0.5000 mg | Freq: Four times a day (QID) | RESPIRATORY_TRACT | Status: DC | PRN

## 2019-12-20 MED ORDER — INSULIN ASPART 100 UNIT/ML ~~LOC~~ SOLN
0.0000 [IU] | Freq: Every day | SUBCUTANEOUS | Status: DC
  Administered 2019-12-21 (×2): 3 [IU] via SUBCUTANEOUS
  Administered 2019-12-22: 5 [IU] via SUBCUTANEOUS
  Administered 2019-12-23: 4 [IU] via SUBCUTANEOUS
  Filled 2019-12-20: qty 1

## 2019-12-20 MED ORDER — INSULIN GLARGINE 100 UNIT/ML ~~LOC~~ SOLN
30.0000 [IU] | Freq: Two times a day (BID) | SUBCUTANEOUS | Status: DC
  Administered 2019-12-21 – 2019-12-24 (×8): 30 [IU] via SUBCUTANEOUS
  Filled 2019-12-20 (×12): qty 0.3

## 2019-12-20 MED ORDER — LORAZEPAM 2 MG/ML IJ SOLN
0.5000 mg | Freq: Once | INTRAMUSCULAR | Status: AC
  Administered 2019-12-20: 0.5 mg via INTRAVENOUS
  Filled 2019-12-20: qty 1

## 2019-12-20 MED ORDER — ONDANSETRON HCL 4 MG PO TABS: 4.0000 mg | ORAL_TABLET | Freq: Four times a day (QID) | ORAL | Status: DC | PRN

## 2019-12-20 MED ORDER — IPRATROPIUM-ALBUTEROL 20-100 MCG/ACT IN AERS: 1.0000 | INHALATION_SPRAY | Freq: Four times a day (QID) | RESPIRATORY_TRACT | Status: DC

## 2019-12-20 MED ORDER — METOPROLOL TARTRATE 5 MG/5ML IV SOLN
5.0000 mg | Freq: Once | INTRAVENOUS | Status: AC
  Administered 2019-12-20: 5 mg via INTRAVENOUS
  Filled 2019-12-20: qty 5

## 2019-12-20 MED ORDER — MORPHINE SULFATE (PF) 2 MG/ML IV SOLN
2.0000 mg | Freq: Once | INTRAVENOUS | Status: AC
  Administered 2019-12-20: 2 mg via INTRAVENOUS
  Filled 2019-12-20: qty 1

## 2019-12-20 MED ORDER — MAGNESIUM SULFATE 2 GM/50ML IV SOLN
2.0000 g | Freq: Once | INTRAVENOUS | Status: AC
  Administered 2019-12-20: 2 g via INTRAVENOUS
  Filled 2019-12-20: qty 50

## 2019-12-20 MED ORDER — IPRATROPIUM BROMIDE 0.02 % IN SOLN: 0.5000 mg | Freq: Four times a day (QID) | RESPIRATORY_TRACT | Status: DC

## 2019-12-20 MED ORDER — LEVALBUTEROL HCL 0.63 MG/3ML IN NEBU
INHALATION_SOLUTION | RESPIRATORY_TRACT | Status: AC
  Administered 2019-12-20: 0.63 mg
  Filled 2019-12-20: qty 3

## 2019-12-20 MED ORDER — METHYLPREDNISOLONE SODIUM SUCC 125 MG IJ SOLR
125.0000 mg | Freq: Once | INTRAMUSCULAR | Status: AC
  Administered 2019-12-20: 125 mg via INTRAVENOUS
  Filled 2019-12-20: qty 2

## 2019-12-20 MED ORDER — ALBUTEROL (5 MG/ML) CONTINUOUS INHALATION SOLN
10.0000 mg/h | INHALATION_SOLUTION | Freq: Once | RESPIRATORY_TRACT | Status: AC
  Administered 2019-12-20: 10 mg/h via RESPIRATORY_TRACT
  Filled 2019-12-20: qty 20

## 2019-12-20 MED ORDER — OXYCODONE HCL 5 MG PO TABS
5.0000 mg | ORAL_TABLET | ORAL | Status: DC | PRN
  Administered 2019-12-21 – 2019-12-23 (×4): 5 mg via ORAL
  Filled 2019-12-20 (×4): qty 1

## 2019-12-20 MED ORDER — INSULIN ASPART 100 UNIT/ML ~~LOC~~ SOLN
0.0000 [IU] | Freq: Three times a day (TID) | SUBCUTANEOUS | Status: DC
  Administered 2019-12-21: 11 [IU] via SUBCUTANEOUS
  Administered 2019-12-21: 8 [IU] via SUBCUTANEOUS
  Administered 2019-12-21: 3 [IU] via SUBCUTANEOUS
  Administered 2019-12-22 (×2): 11 [IU] via SUBCUTANEOUS
  Administered 2019-12-22 – 2019-12-23 (×2): 8 [IU] via SUBCUTANEOUS
  Administered 2019-12-23: 11 [IU] via SUBCUTANEOUS
  Administered 2019-12-23: 15 [IU] via SUBCUTANEOUS
  Administered 2019-12-24: 5 [IU] via SUBCUTANEOUS
  Administered 2019-12-24: 8 [IU] via SUBCUTANEOUS
  Filled 2019-12-20 (×2): qty 1

## 2019-12-20 MED ORDER — ACETAMINOPHEN 650 MG RE SUPP: 650.0000 mg | Freq: Four times a day (QID) | RECTAL | Status: DC | PRN

## 2019-12-20 MED ORDER — HEPARIN SODIUM (PORCINE) 5000 UNIT/ML IJ SOLN
5000.0000 [IU] | Freq: Three times a day (TID) | INTRAMUSCULAR | Status: DC
  Administered 2019-12-21: 5000 [IU] via SUBCUTANEOUS
  Filled 2019-12-20: qty 1

## 2019-12-20 MED ORDER — IPRATROPIUM BROMIDE 0.02 % IN SOLN
RESPIRATORY_TRACT | Status: AC
  Administered 2019-12-20: 0.5 mg
  Filled 2019-12-20: qty 2.5

## 2019-12-20 MED ORDER — LEVALBUTEROL HCL 1.25 MG/0.5ML IN NEBU: 1.2500 mg | INHALATION_SOLUTION | Freq: Four times a day (QID) | RESPIRATORY_TRACT | Status: DC

## 2019-12-20 MED ORDER — ALBUTEROL SULFATE (2.5 MG/3ML) 0.083% IN NEBU: 2.5000 mg | INHALATION_SOLUTION | RESPIRATORY_TRACT | Status: DC | PRN

## 2019-12-20 MED ORDER — METHYLPREDNISOLONE SODIUM SUCC 125 MG IJ SOLR
60.0000 mg | Freq: Two times a day (BID) | INTRAMUSCULAR | Status: DC
  Administered 2019-12-21: 60 mg via INTRAVENOUS
  Filled 2019-12-20: qty 2

## 2019-12-20 MED ORDER — LOSARTAN POTASSIUM 25 MG PO TABS: 25.0000 mg | ORAL_TABLET | Freq: Every day | ORAL | Status: DC

## 2019-12-20 MED ORDER — LEVALBUTEROL HCL 0.63 MG/3ML IN NEBU: 0.6300 mg | INHALATION_SOLUTION | Freq: Four times a day (QID) | RESPIRATORY_TRACT | Status: DC

## 2019-12-20 MED ORDER — THIAMINE HCL 100 MG PO TABS
100.0000 mg | ORAL_TABLET | Freq: Every day | ORAL | Status: DC
  Administered 2019-12-22 – 2019-12-24 (×3): 100 mg via ORAL
  Filled 2019-12-20 (×3): qty 1

## 2019-12-20 MED ORDER — MONTELUKAST SODIUM 10 MG PO TABS
10.0000 mg | ORAL_TABLET | Freq: Every day | ORAL | Status: DC
  Administered 2019-12-21 – 2019-12-23 (×3): 10 mg via ORAL
  Filled 2019-12-20 (×3): qty 1

## 2019-12-20 MED ORDER — UMECLIDINIUM-VILANTEROL 62.5-25 MCG/INH IN AEPB: 1.0000 | INHALATION_SPRAY | Freq: Every day | RESPIRATORY_TRACT | Status: DC

## 2019-12-20 MED ORDER — ACETAMINOPHEN 325 MG PO TABS: 650.0000 mg | ORAL_TABLET | Freq: Four times a day (QID) | ORAL | Status: DC | PRN

## 2019-12-20 MED ORDER — IPRATROPIUM-ALBUTEROL 0.5-2.5 (3) MG/3ML IN SOLN: 3.0000 mL | Freq: Four times a day (QID) | RESPIRATORY_TRACT | Status: DC | PRN

## 2019-12-20 MED ORDER — POLYETHYLENE GLYCOL 3350 17 G PO PACK: 17.0000 g | PACK | Freq: Every day | ORAL | Status: DC | PRN

## 2019-12-20 MED ORDER — ONDANSETRON HCL 4 MG/2ML IJ SOLN: 4.0000 mg | Freq: Four times a day (QID) | INTRAMUSCULAR | Status: DC | PRN

## 2019-12-20 MED ORDER — SODIUM CHLORIDE 0.9 % IV SOLN
1.0000 g | INTRAVENOUS | Status: DC
  Administered 2019-12-21 – 2019-12-23 (×4): 1 g via INTRAVENOUS
  Filled 2019-12-20 (×4): qty 10

## 2019-12-20 MED ORDER — METOPROLOL SUCCINATE ER 25 MG PO TB24: 25.0000 mg | ORAL_TABLET | Freq: Every day | ORAL | Status: DC

## 2019-12-20 NOTE — ED Provider Notes (Signed)
Physical Exam  BP (!) 176/98   Pulse (!) 120   Temp 98.7 F (37.1 C) (Oral)   Resp (!) 21   Ht 5\' 9"  (1.753 m)   Wt 90.7 kg   SpO2 94%   BMI 29.53 kg/m   Physical Exam Vitals and nursing note reviewed.  Constitutional:      Appearance: He is obese.  HENT:     Head: Normocephalic and atraumatic.     Mouth/Throat:     Mouth: Mucous membranes are moist.     Pharynx: No oropharyngeal exudate or posterior oropharyngeal erythema.  Eyes:     General:        Right eye: No discharge.        Left eye: No discharge.     Conjunctiva/sclera: Conjunctivae normal.     Pupils: Pupils are equal, round, and reactive to light.  Cardiovascular:     Rate and Rhythm: Regular rhythm. Tachycardia present.     Pulses: Normal pulses.          Radial pulses are 2+ on the right side and 2+ on the left side.     Heart sounds: No murmur heard.   Pulmonary:     Effort: Tachypnea and accessory muscle usage present. No respiratory distress.     Breath sounds: Examination of the right-upper field reveals wheezing. Examination of the left-upper field reveals wheezing. Examination of the right-middle field reveals wheezing. Examination of the left-middle field reveals wheezing. Examination of the right-lower field reveals wheezing. Examination of the left-lower field reveals wheezing. Wheezing present. No rales.     Comments: Patient speaking in full sentences, though with difficulty. Chest:     Chest wall: No mass, deformity, tenderness, crepitus or edema.  Abdominal:     General: Bowel sounds are normal. There is no distension.     Palpations: Abdomen is soft.     Tenderness: There is no abdominal tenderness.  Musculoskeletal:        General: No deformity.     Cervical back: Neck supple.  Skin:    General: Skin is warm and dry.     Capillary Refill: Capillary refill takes less than 2 seconds.  Neurological:     General: No focal deficit present.     Mental Status: He is alert and oriented to  person, place, and time. Mental status is at baseline.     Gait: Gait is intact.  Psychiatric:        Mood and Affect: Mood normal.     ED Course/Procedures     Procedures  MDM  This patient was signed out to me at shift change from preceding ED provider, PA-C.  Patient with history of COPD, diabetes, CAD who presented with shortness of breath and cough x 3 days. States that he has had a history of multiple COPD exacerbations, however has been doing well the last 2 months since his pulmonologist placed him on a new medication, Anoro.  Patient is vaccinated COVID-19, seen via telehealth visit by PCP yesterday, given azithromycin 3 days ago, without improvement.  Patient with likely COPD exacerbation, currently receiving continuous neb.  Pending reevaluation may need to be admitted to medicine service for further management.   Patient negative for COVID-19, influenza A/B.   CBC significant for leukocytosis 15.4, previously fifteen 1 month ago. No clear cause for elevated WBC.   Chest x-ray without acute process in the chest.  EKG sinus rhythm.  Has received Solu-Medrol   At  time of my evaluation of the patient, he has completed his continuous nebulizer. He remains tachycardic to the 120s and tachypneic to 24 breaths per minute. Patient with persistent wheezing throughout the lung fields despite continuous nebulizer.  Recommend admission to the hospital at this time.  Hospitalist consulted, Dr.Zierle-Gosh, who is agreeable to admitting this patient to her service.  I appreciate her collaboration in the care of this patient. Magnesium ordered.  Approximately 10 minutes after I left the patient bedside, a nurse requested my assistance with this patient. Patient now reporting central chest heaviness, severe headache. Patient now with tachycardic to 140s, continues to be tachypneic, now diaphoretic. Blood pressure elevated to 186/101, prior to that was 142/53. Hospitalist  presented to the bedside to evaluate the patient. Per hospitalist request, metoprolol, morphine ordered for this patient. Concern for flash pulmonary edema, hospitalist requesting repeat chest xray.   Patient care transferred to specialist, Dr. Sheliah Plane; ED course reviewed including all pertinent labs and imaging.       Sherrilee Gilles 12/20/19 2103    Melene Plan, DO 12/20/19 2141

## 2019-12-20 NOTE — ED Triage Notes (Signed)
Pt brought to ED via [facility] for increased SOB. Pt hx COPD. Pt states SOB got worse this am. Pt fever 100.9 yesterday. Pt placed on 4L per [place] by EMS.

## 2019-12-20 NOTE — H&P (Signed)
TRH H&P    Patient Demographics:    Robert Rich, is a 54 y.o. male  MRN: [id number]  DOB - 1966/01/14  Admit Date - 12/20/2019  Referring MD/NP/PA: Adela Lank  Outpatient Primary MD for the patient is Hemberg, Ruby Cola, NP  Patient coming from: Home  Chief complaint- Dyspnea   HPI:    Robert Rich  is a 54 y.o. male, with history of CVA, obesity, hypertension, hyperlipidemia, MI, diabetes mellitus type 2, COPD, and more presents to the ER with a chief complaint of dyspnea and cough.  Patient reports that 2 days ago he started to feel sinus congestion and sore throat.  The next day he has developed a cough and shortness of breath.  He talked to his PCP who verified that he is compliant with his daily medications, prescribed Zithromax and cough syrup.  Patient reports that he took 1 dose of the Zithromax at 6 PM 12/19/2019.  He reports he took the Robitussin and it helped minimally.  He did not sleep well and was up every 1 hour with dyspnea.  Patient reports that his dyspnea is worse with exertion, better with rest but not resolved.  This morning patient took his Anoro inhaler and had temporary relief.  He reports that his COPD has been under much better control since he started Anoro 2 months ago, and he has barely had to use his rescue inhaler or his nebulizer.  After breakfast -which was high and salt content with bacon country ham biscuits -he did have to use his nebulizer because the shortness of breath was worse -but it offered no relief at all.  He reports T-max of 100.1 during this whole time.  He reports that he was monitoring his oxygen saturations at home and they were staying between 90 and 95.  He reports that his cough has gotten more frequent, with more sputum production.  The sputum is light brown and white.  At home he did not have chest pain, but he did develop acute chest pain in the ER.  He reports that  he felt diaphoretic, sat in his chair and then had left-sided and center chest pressure that felt squeezing.  There was an occasional jab of sharp pain mixed in with the pressure.  He reports that he felt palpitations at that time 2.  That time his blood pressure spiked to 180s over 100.  He was given morphine and metoprolol, and his oxygen cannula was continued.  Patient's chest pain improved with this regimen.  EKG was done that showed no acute ST changes.  Initial troponin was 2.  Repeat chest x-ray still showed no active disease.  In the ED temperature 98.7, heart rate 86-123, respiratory rate 15-34, blood pressure 142/53-180s/100s satting at 92% on 4 L nasal cannula at time of admission Leukocytosis with white blood cell count of 15.4, thrombocytosis with a platelet count of 444 Chemistry panel is unremarkable Covid and flu negative Both chest x-ray showed no active disease EKG showed a heart rate of 82 and a sinus rhythm with  a QTC of 440 initially, repeat EKG showed sinus tach with a rate of 120 Patient was given continuous neb and Solu-Medrol in the ED Admission requested because patient continued to wheeze and also had the acute chest pain while in the ER    Review of systems:    In addition to the HPI above,  No Fever-chills, admits to diaphoresis No Headache, No changes with Vision or hearing, No problems swallowing food or Liquids, Admits to chest pain, cough, shortness of breath No Abdominal pain, No Nausea or Vomiting, bowel movements are regular, No Blood in stool or Urine, No dysuria, No new skin rashes or bruises, No new joints pains-aches,  No new weakness, tingling, numbness in any extremity, No recent weight gain or loss, No polyuria, polydypsia or polyphagia, No significant Mental Stressors.  All other systems reviewed and are negative.    Past History of the following :    Past Medical History:  Diagnosis Date  . COPD (chronic obstructive pulmonary disease)  (HCC)   . Coronary artery disease   . DM (diabetes mellitus) (HCC)   . Heart attack (HCC)   . HLD (hyperlipidemia)   . HTN (hypertension)   . Obesity   . Stroke [facility])       Past Surgical History:  Procedure Laterality Date  . INTRAVASCULAR PRESSURE WIRE/FFR STUDY N/A 03/18/2019   Procedure: INTRAVASCULAR PRESSURE WIRE/FFR STUDY;  Surgeon: [name], MD;  Location: [facility] INVASIVE CV LAB;  Service: Cardiovascular;  Laterality: N/A;  . LEFT HEART CATH AND CORONARY ANGIOGRAPHY N/A 03/18/2019   Procedure: LEFT HEART CATH AND CORONARY ANGIOGRAPHY;  Surgeon: [name], MD;  Location: [facility] INVASIVE CV LAB;  Service: Cardiovascular;  Laterality: N/A;  . NO PAST SURGERIES        Social History:      Social History   Tobacco Use  . Smoking status: Former Smoker    Packs/day: 0.10    Years: 20.00    Pack years: 2.00    Types: Cigarettes    Quit date: 03/17/2019    Years since quitting: 0.7  . Smokeless tobacco: Never Used  . Tobacco comment: Quit in feb of 2021  Substance Use Topics  . Alcohol use: Not Currently    Alcohol/week: 12.0 standard drinks    Types: 12 Cans of beer per week    Comment: Quit in Feb 2021       Family History :     Family History  Problem Relation Age of Onset  . Kidney disease Father   . Heart disease Mother   . Heart failure Mother   . Atrial fibrillation Mother       Home Medications:   Prior to Admission medications   Medication Sig Start Date End Date Taking? Authorizing Provider  albuterol (PROVENTIL) (2.5 MG/3ML) 0.083% nebulizer solution Take 3 mLs (2.5 mg total) by nebulization every 4 (four) hours as needed for wheezing or shortness of breath. 10/25/19  Yes [name], MD  atorvastatin (LIPITOR) 40 MG tablet Take 1 tablet (40 mg total) by mouth daily at 6 PM. 07/22/19  Yes [name], MD  azithromycin [facility]) 500 MG tablet Take by mouth. 12/19/19 12/22/19 Yes [provider]  butalbital-acetaminophen-caffeine (FIORICET)  50-325-40 MG tablet Take 1 tablet by mouth every 6 (six) hours as needed for migraine. 07/22/19  Yes [name], MD  clopidogrel (PLAVIX) 75 MG tablet Take 1 tablet (75 mg total) by mouth daily. 07/22/19  Yes [name], MD  dextromethorphan-guaiFENesin [facility])  10-100 MG/5ML liquid Take by mouth. 12/19/19 12/29/19 Yes [provider]  folic acid (FOLVITE) 1 MG tablet Take 1 tablet (1 mg total) by mouth daily. 07/22/19  Yes [name], MD  insulin NPH-regular Human (NOVOLIN 70/30) (70-30) 100 UNIT/ML injection Inject 50 Units into the skin 2 (two) times daily with a meal. 08/11/19  Yes [name], [name], MD  ipratropium (ATROVENT) 0.02 % nebulizer solution Take 2.5 mLs (0.5 mg total) by nebulization 4 (four) times daily. 10/25/19  Yes [name], MD  ipratropium-albuterol (DUONEB) 0.5-2.5 (3) MG/3ML SOLN Inhale 3 mLs into the lungs 4 (four) times daily as needed. 10/25/19  Yes [provider]  losartan (COZAAR) 25 MG tablet Take 1 tablet (25 mg total) by mouth daily. 04/18/19 12/20/19 Yes [name], MD  metFORMIN (GLUCOPHAGE) 1000 MG tablet Take 1 tablet (1,000 mg total) by mouth 2 (two) times daily with a meal. 07/22/19  Yes [name], MD  metoprolol succinate (TOPROL-XL) 25 MG 24 hr tablet Take 1 tablet (25 mg total) by mouth daily. 07/22/19  Yes [name], MD  montelukast (SINGULAIR) 10 MG tablet Take 1 tablet (10 mg total) by mouth at bedtime. 08/11/19  Yes [name], [name], MD  Multiple Vitamin (MULTIVITAMIN WITH MINERALS) TABS tablet Take 1 tablet by mouth daily. 07/23/19  Yes [name], MD  thiamine 100 MG tablet Take 2.5 tablets (250 mg total) by mouth daily. Patient taking differently: Take 100 mg by mouth daily.  07/22/19  Yes [name], MD  umeclidinium-vilanterol [facility] ELLIPTA) 62.5-25 MCG/INH AEPB Inhale 1 puff into the lungs daily. 10/17/19 01/15/20 Yes [name], MD  predniSONE (DELTASONE) 20 MG tablet Take 2 tablets (40 mg total) by mouth daily  with breakfast. For the next four days Patient not taking: Reported on 12/20/2019 11/01/19   Gerhard Munch, MD  Vitamin D, Ergocalciferol, (DRISDOL) 1.25 MG (50000 UNIT) CAPS capsule Take 1 capsule (50,000 Units total) by mouth every 7 (seven) days. Patient not taking: Reported on 12/20/2019 08/12/19   [name], MD     Allergies:     Allergies  Allergen Reactions  . Asa [Aspirin] Hives  . Zolmitriptan Nausea And Vomiting and Other (See Comments)    Severe headaches     Physical Exam:   Vitals  Blood pressure (!) 176/98, pulse (!) 120, temperature 98.7 F (37.1 C), temperature source Oral, resp. rate (!) 21, height  (1.753 m), weight 90.7 kg, SpO2 94 %.  1.  General: Sitting in chair at bedside visibly short of breath  2. Psychiatric: Mood and behavior normal for situation, cooperative with exam, alert and oriented x3  3. Neurologic: Cranial nerves II through XII are intact, moves all 4 extremities voluntarily, speech and language are normal, no acute deficit on limited exam  4. HEENMT:  Head is atraumatic, normocephalic, pupils are reactive to light, neck is supple, trachea is midline, mucous membranes are moist  5. Respiratory : Diffuse inspiratory and expiratory wheezing, no rhonchi, no crackles, use of accessory muscles to breathe, speaking in 2-3 word phrases before stopping for breath  6. Cardiovascular : Heart rate is tachycardic, rhythm is regular, no murmurs rubs or gallops, no cyanosis, no peripheral edema  7. Gastrointestinal:  Abdomen is soft, nondistended, nontender to palpation, bowel sounds active  8. Skin:  Skin is dry, warm, intact, no acute lesion on limited exam  9.Musculoskeletal:  No peripheral edema, acute deformity, calf tenderness    Data Review:    CBC Recent Labs  Lab  12/20/19 1600  WBC 15.4*  HGB 13.4  HCT 41.0  PLT 444*  MCV 86.7  MCH 28.3  MCHC 32.7  RDW 12.9  LYMPHSABS 2.2  MONOABS 1.1*  EOSABS 0.6*  BASOSABS 0.1    ------------------------------------------------------------------------------------------------------------------  Results for orders placed or performed during the hospital encounter of 12/20/19 (from the past 48 hour(s))  Resp Panel by RT-PCR (Flu A&B, Covid) Nasopharyngeal Swab     Status: None   Collection Time: 12/20/19  2:54 PM   Specimen: Nasopharyngeal Swab; Nasopharyngeal(NP) swabs in vial transport medium  Result Value Ref Range   SARS Coronavirus 2 by RT PCR NEGATIVE NEGATIVE    Comment: (NOTE) SARS-CoV-2 target nucleic acids are NOT DETECTED.  The SARS-CoV-2 RNA is generally detectable in upper respiratory specimens during the acute phase of infection. The lowest concentration of SARS-CoV-2 viral copies this assay can detect is 138 copies/mL. A negative result does not preclude SARS-Cov-2 infection and should not be used as the sole basis for treatment or other patient management decisions. A negative result may occur with  improper specimen collection/handling, submission of specimen other than nasopharyngeal swab, presence of viral mutation(s) within the areas targeted by this assay, and inadequate number of viral copies(<138 copies/mL). A negative result must be combined with clinical observations, patient history, and epidemiological information. The expected result is Negative.  Fact Sheet for Patients:  BloggerCourse.com  Fact Sheet for Healthcare Providers:  SeriousBroker.it  This test is no t yet approved or cleared by the [place] FDA and  has been authorized for detection and/or diagnosis of SARS-CoV-2 by FDA under an Emergency Use Authorization (EUA). This EUA will remain  in effect (meaning this test can be used) for the duration of the COVID-19 declaration under Section 564(b)(1) of the Act, 21 U.S.C.section 360bbb-3(b)(1), unless the authorization is terminated  or revoked sooner.        Influenza A by PCR NEGATIVE NEGATIVE   Influenza B by PCR NEGATIVE NEGATIVE    Comment: (NOTE) The Xpert Xpress SARS-CoV-2/FLU/RSV plus assay is intended as an aid in the diagnosis of influenza from Nasopharyngeal swab specimens and should not be used as a sole basis for treatment. Nasal washings and aspirates are unacceptable for Xpert Xpress SARS-CoV-2/FLU/RSV testing.  Fact Sheet for Patients: BloggerCourse.com  Fact Sheet for Healthcare Providers: SeriousBroker.it  This test is not yet approved or cleared by the [place] FDA and has been authorized for detection and/or diagnosis of SARS-CoV-2 by FDA under an Emergency Use Authorization (EUA). This EUA will remain in effect (meaning this test can be used) for the duration of the COVID-19 declaration under Section 564(b)(1) of the Act, 21 U.S.C. section 360bbb-3(b)(1), unless the authorization is terminated or revoked.  Performed at [facility], [address] [place], [place] [address]   CBC with Differential     Status: Abnormal   Collection Time: 12/20/19  4:00 PM  Result Value Ref Range   WBC 15.4 (H) 4.0 - 10.5 K/uL   RBC 4.73 4.22 - 5.81 MIL/uL   Hemoglobin 13.4 13.0 - 17.0 g/dL   HCT 75.1 39 - 52 %   MCV 86.7 80.0 - 100.0 fL   MCH 28.3 26.0 - 34.0 pg   MCHC 32.7 30.0 - 36.0 g/dL   RDW 02.5 85.2 - 77.8 %   Platelets 444 (H) 150 - 400 K/uL   nRBC 0.0 0.0 - 0.2 %   Neutrophils Relative % 74 %   Neutro Abs 11.3 (H) 1.7 -  7.7 K/uL   Lymphocytes Relative 14 %   Lymphs Abs 2.2 0.7 - 4.0 K/uL   Monocytes Relative 7 %   Monocytes Absolute 1.1 (H) 0.1 - 1.0 K/uL   Eosinophils Relative 4 %   Eosinophils Absolute 0.6 (H) 0.0 - 0.5 K/uL   Basophils Relative 1 %   Basophils Absolute 0.1 0.0 - 0.1 K/uL   Immature Granulocytes 0 %   Abs Immature Granulocytes 0.05 0.00 - 0.07 K/uL    Comment: Performed at [facility], [address] [place], [place] [address]   Comprehensive metabolic panel     Status: Abnormal   Collection Time: 12/20/19  4:00 PM  Result Value Ref Range   Sodium 136 135 - 145 mmol/L   Potassium 3.9 3.5 - 5.1 mmol/L   Chloride 100 98 - 111 mmol/L   CO2 25 22 - 32 mmol/L   Glucose, Bld 105 (H) 70 - 99 mg/dL    Comment: Glucose reference range applies only to samples taken after fasting for at least 8 hours.   BUN 10 6 - 20 mg/dL   Creatinine, Ser 6.040.60 (L) 0.61 - 1.24 mg/dL   Calcium 8.9 8.9 - 54.010.3 mg/dL   Total Protein 7.7 6.5 - 8.1 g/dL   Albumin 4.3 3.5 - 5.0 g/dL   AST 15 15 - 41 U/L   ALT 19 0 - 44 U/L   Alkaline Phosphatase 53 38 - 126 U/L   Total Bilirubin 0.2 (L) 0.3 - 1.2 mg/dL   GFR, Estimated >98>60 >11>60 mL/min    Comment: (NOTE) Calculated using the CKD-EPI Creatinine Equation (2021)    Anion gap 11 5 - 15    Comment: Performed at [facility], [address] [place], [place] [address]  POC CBG, ED     Status: Abnormal   Collection Time: 12/20/19  6:37 PM  Result Value Ref Range   Glucose-Capillary 223 (H) 70 - 99 mg/dL    Comment: Glucose reference range applies only to samples taken after fasting for at least 8 hours.  Blood gas, venous     Status: Abnormal   Collection Time: 12/20/19  9:00 PM  Result Value Ref Range   FIO2 94.00    pH, Ven 7.310 7.25 - 7.43   pCO2, Ven 46.7 44 - 60 mmHg   pO2, Ven 42.1 32 - 45 mmHg   Bicarbonate 21.2 20.0 - 28.0 mmol/L   Acid-base deficit 2.5 (H) 0.0 - 2.0 mmol/L   O2 Saturation 71.0 %   Patient temperature 37.0     Comment: Performed at [facility], [address] [place], [place] [address]    Chemistries  Recent Labs  Lab 12/20/19 1600  NA 136  K 3.9  CL 100  CO2 25  GLUCOSE 105*  BUN 10  CREATININE 0.60*  CALCIUM 8.9  AST 15  ALT 19  ALKPHOS 53  BILITOT 0.2*    ------------------------------------------------------------------------------------------------------------------  ------------------------------------------------------------------------------------------------------------------ GFR: Estimated Creatinine Clearance: 118.9 mL/min (A) (by C-G formula based on SCr of 0.6 mg/dL (L)). Liver Function Tests: Recent Labs  Lab 12/20/19 1600  AST 15  ALT 19  ALKPHOS 53  BILITOT 0.2*  PROT 7.7  ALBUMIN 4.3   No results for input(s): LIPASE, AMYLASE in the last 168 hours. No results for input(s): AMMONIA in the last 168 hours. Coagulation Profile: No results for input(s): INR, PROTIME in the last 168 hours. Cardiac Enzymes: No results for input(s): CKTOTAL, CKMB, CKMBINDEX, TROPONINI in the last 168 hours. BNP (  last 3 results) No results for input(s): PROBNP in the last 8760 hours. HbA1C: No results for input(s): HGBA1C in the last 72 hours. CBG: Recent Labs  Lab 12/20/19 1837  GLUCAP 223*   Lipid Profile: No results for input(s): CHOL, HDL, LDLCALC, TRIG, CHOLHDL, LDLDIRECT in the last 72 hours. Thyroid Function Tests: No results for input(s): TSH, T4TOTAL, FREET4, T3FREE, THYROIDAB in the last 72 hours. Anemia Panel: No results for input(s): VITAMINB12, FOLATE, FERRITIN, TIBC, IRON, RETICCTPCT in the last 72 hours.  --------------------------------------------------------------------------------------------------------------- Urine analysis:    Component Value Date/Time   COLORURINE YELLOW 11/01/2019 1223   APPEARANCEUR CLEAR 11/01/2019 1223   LABSPEC 1.023 11/01/2019 1223   PHURINE 5.0 11/01/2019 1223   GLUCOSEU NEGATIVE 11/01/2019 1223   HGBUR NEGATIVE 11/01/2019 1223   BILIRUBINUR NEGATIVE 11/01/2019 1223   KETONESUR NEGATIVE 11/01/2019 1223   PROTEINUR NEGATIVE 11/01/2019 1223   NITRITE NEGATIVE 11/01/2019 1223   LEUKOCYTESUR NEGATIVE 11/01/2019 1223      Imaging Results:    DG Chest Port 1 View  Result  Date: 12/20/2019 CLINICAL DATA:  Shortness of breath and fever. EXAM: PORTABLE CHEST 1 VIEW COMPARISON:  November 01, 2019 FINDINGS: The heart size and mediastinal contours are within normal limits. Both lungs are clear. The visualized skeletal structures are unremarkable. IMPRESSION: No active disease. Electronically Signed   By: Aram Candela M.D.   On: 12/20/2019 16:30      Assessment & Plan:    Active Problems:   Acute respiratory failure with hypoxia (HCC)   Hypertensive crisis   COPD (chronic obstructive pulmonary disease) (HCC)   Type 2 diabetes mellitus without complication (HCC)   1. Hypertensive crisis with chest Pain 1. Acute chest pain, BP 180/100 2. EKG Sinus Tach 120, no ST changes 3. Morphine, metoprolol given in ED 4. Initial troponin to continue to trend 5. Monitor on telemetry 6. Chest x-ray repeated after blood pressure spike in chest pain started -still no active disease 7. Blood pressure improved to 144 over 70s after metoprolol 2. Acute hypoxic respiratory failure 1. Oxygen saturations reported to be high 80s and low 90s with EMS 2. Patient on 4 L nasal cannula satting at 97% 3. Likely secondary to below 4. VBG does not show hypercapnia with a CO2 of 46.7 5. Continue treatment as below and continue to monitor 3. COPD exacerbation 1. VBG is slightly ascitic at 7.3, PCO2 is 46.7 2. Continue nebulizer treatments 3. Continue systemic steroids 4. Patient has wheezing, increased shortness of breath, increased cough with sputum production, leukocytosis, thrombocytosis, no pneumonia finding on chest x-ray-moderate COPD exacerbation -start Rocephin 5. Sputum assessment 6. Continue to monitor 4. SIRS criteria 1. Patient meets SIRS criteria with heart rate 123, respiratory rate 34, white blood cell count 15.4 2. Rule out sepsis 3. No pneumonia finding on chest x-ray 4. Procalcitonin pending 5. All of these criteria could be explained with COPD  exacerbation 6. Covered with Rocephin 7. Continue to monitor 5. DMII 1. Patient takes 50 units of 70/30 twice daily per his report 2. Continue two thirds of that dose, sliding scale coverage 3. Carb modified heart healthy diet 4. Extensive counseling on diet given 6. Thrombocytosis 1. Likely acute phase reactant 2. See plans above    DVT Prophylaxis-   Heparin  - SCDs   AM Labs Ordered, also please review Full Orders  Family Communication:No family at bedside  Code Status:  FULL  Admission status: Observation Time spent in minutes : 64   Kaitlynne Wenz B  Zierle-Ghosh DO

## 2019-12-20 NOTE — ED Provider Notes (Signed)
[facility] EMERGENCY DEPARTMENT Provider Note   CSN: [id number] Arrival date & time: 12/20/19  1412     History Chief Complaint  Patient presents with  . Shortness of Breath    Robert Rich is a 54 y.o. male with a history of COPD, diabetes, obesity, DM, and CAD.  Patient presents with a chief complaint of shortness of breath and productive cough.  Patient states that his shortness of breath and cough started yesterday, have been constant, and worsening.  Patient states his shortness of breath has not been relieved with albuterol nebulizer treatment.  Patient shortness of breath is worse worse with exertion.  Patient reports producing brown sputum.    Patient reports he had a fever of 100.64F orally when checked at home yesterday.  Patient reports generalized myalgias.  Patient reports losing his sense of taste and smell this morning.  Has completed COVID-19 vaccination.  Patient was seen via telehealth yesterday and started on guaifenesin azithromycin.  Patient reports minimal improvement with these medications.      Past Medical History:  Diagnosis Date  . COPD (chronic obstructive pulmonary disease) (HCC)   . Coronary artery disease   . DM (diabetes mellitus) (HCC)   . Heart attack (HCC)   . HLD (hyperlipidemia)   . HTN (hypertension)   . Obesity   . Stroke [facility])     Patient Active Problem List   Diagnosis Date Noted  . COPD GOLD 0/  group B vs D  08/24/2019  . Multiple pulmonary nodules determined by computed tomography of lung 08/24/2019  . COPD exacerbation (HCC)   . COPD with acute exacerbation (HCC) 08/04/2019  . Coronary artery disease   . HLD (hyperlipidemia)   . Leukocytosis   . Hyperglycemia   . Steroid-induced hyperglycemia   . Acute respiratory failure with hypoxia (HCC)   . Morbid obesity due to excess calories (HCC)   . HTN (hypertension)   . Anxiety   . Substance abuse (HCC)   . Cardiac arrest (HCC) 03/17/2019  . 2019 novel coronavirus disease  (COVID-19) 12/07/2018  . Tobacco abuse 12/06/2018    Past Surgical History:  Procedure Laterality Date  . INTRAVASCULAR PRESSURE WIRE/FFR STUDY N/A 03/18/2019   Procedure: INTRAVASCULAR PRESSURE WIRE/FFR STUDY;  Surgeon: [name], MD;  Location: [facility] INVASIVE CV LAB;  Service: Cardiovascular;  Laterality: N/A;  . LEFT HEART CATH AND CORONARY ANGIOGRAPHY N/A 03/18/2019   Procedure: LEFT HEART CATH AND CORONARY ANGIOGRAPHY;  Surgeon: [name], MD;  Location: [facility] INVASIVE CV LAB;  Service: Cardiovascular;  Laterality: N/A;  . NO PAST SURGERIES         Family History  Problem Relation Age of Onset  . Kidney disease Father   . Heart disease Mother   . Heart failure Mother   . Atrial fibrillation Mother     Social History   Tobacco Use  . Smoking status: Former Smoker    Packs/day: 0.10    Years: 20.00    Pack years: 2.00    Types: Cigarettes    Quit date: 03/17/2019    Years since quitting: 0.7  . Smokeless tobacco: Never Used  . Tobacco comment: Quit in feb of 2021  Vaping Use  . Vaping Use: Never used  Substance Use Topics  . Alcohol use: Not Currently    Alcohol/week: 12.0 standard drinks    Types: 12 Cans of beer per week    Comment: Quit in Feb 2021  . Drug use: Not Currently  Home Medications Prior to Admission medications   Medication Sig Start Date End Date Taking? Authorizing Provider  albuterol (PROVENTIL) (2.5 MG/3ML) 0.083% nebulizer solution Take 3 mLs (2.5 mg total) by nebulization every 4 (four) hours as needed for wheezing or shortness of breath. 10/25/19   [name], MD  atorvastatin (LIPITOR) 40 MG tablet Take 1 tablet (40 mg total) by mouth daily at 6 PM. 07/22/19   [name], MD  butalbital-acetaminophen-caffeine (FIORICET) [phone] MG tablet Take 1 tablet by mouth every 6 (six) hours as needed for migraine. 07/22/19   [name], MD  clopidogrel (PLAVIX) 75 MG tablet Take 1 tablet (75 mg total) by mouth daily. 07/22/19   [name], MD   folic acid (FOLVITE) 1 MG tablet Take 1 tablet (1 mg total) by mouth daily. 07/22/19   [name], MD  insulin NPH-regular Human (NOVOLIN 70/30) (70-30) 100 UNIT/ML injection Inject 50 Units into the skin 2 (two) times daily with a meal. 08/11/19   [name], [name], MD  ipratropium (ATROVENT) 0.02 % nebulizer solution Take 2.5 mLs (0.5 mg total) by nebulization 4 (four) times daily. 10/25/19   [name], MD  ipratropium-albuterol (DUONEB) 0.5-2.5 (3) MG/3ML SOLN Inhale 3 mLs into the lungs 4 (four) times daily as needed. 10/25/19   [provider]  losartan (COZAAR) 25 MG tablet Take 1 tablet (25 mg total) by mouth daily. 04/18/19 11/01/19  [name], MD  metFORMIN (GLUCOPHAGE) 1000 MG tablet Take 1 tablet (1,000 mg total) by mouth 2 (two) times daily with a meal. 07/22/19   [name], MD  metoprolol succinate (TOPROL-XL) 25 MG 24 hr tablet Take 1 tablet (25 mg total) by mouth daily. 07/22/19   [name], MD  montelukast (SINGULAIR) 10 MG tablet Take 1 tablet (10 mg total) by mouth at bedtime. 08/11/19   [name], MD  Multiple Vitamin (MULTIVITAMIN WITH MINERALS) TABS tablet Take 1 tablet by mouth daily. 07/23/19   [name], MD  predniSONE (DELTASONE) 20 MG tablet Take 2 tablets (40 mg total) by mouth daily with breakfast. For the next four days 11/01/19   Gerhard Munch, MD  thiamine 100 MG tablet Take 2.5 tablets (250 mg total) by mouth daily. Patient taking differently: Take 100 mg by mouth daily.  07/22/19   [name], MD  umeclidinium-vilanterol [facility] ELLIPTA) 62.5-25 MCG/INH AEPB Inhale 1 puff into the lungs daily. 10/17/19 01/15/20  [name], MD  Vitamin D, Ergocalciferol, (DRISDOL) 1.25 MG (50000 UNIT) CAPS capsule Take 1 capsule (50,000 Units total) by mouth every 7 (seven) days. 08/12/19   [name], MD    Allergies    Asa [aspirin] and Zolmitriptan  Review of Systems   Review of Systems  Constitutional: Positive for fatigue and fever.  HENT: Positive  for congestion and sore throat. Negative for facial swelling, trouble swallowing and voice change.        Loss of smell and taste  Eyes: Negative for visual disturbance.  Respiratory: Positive for cough (productive), shortness of breath and wheezing.   Cardiovascular: Negative for chest pain, palpitations and leg swelling.  Gastrointestinal: Negative for abdominal distention, abdominal pain, blood in stool, constipation, diarrhea, nausea and vomiting.  Musculoskeletal: Positive for myalgias (Generalized). Negative for neck stiffness.  Skin: Negative for color change and rash.  Neurological: Negative for dizziness, syncope and light-headedness.  Psychiatric/Behavioral: Negative for agitation and confusion.    Physical Exam Updated Vital Signs BP (!) 127/104   Pulse 86   Temp 98.7 F (37.1  C) (Oral)   Resp (!) 24   Ht 5\' 9"  (1.753 m)   Wt 90.7 kg   SpO2 99%   BMI 29.53 kg/m   Physical Exam Vitals reviewed.  Constitutional:      General: He is in acute distress.     Appearance: He is obese. He is ill-appearing.  HENT:     Head: Normocephalic and atraumatic.  Eyes:     General: Vision grossly intact.  Cardiovascular:     Rate and Rhythm: Normal rate and regular rhythm.  Pulmonary:     Effort: Tachypnea, accessory muscle usage, prolonged expiration and respiratory distress present.     Breath sounds: No stridor. Examination of the right-upper field reveals wheezing. Examination of the left-upper field reveals wheezing. Examination of the right-middle field reveals wheezing. Examination of the left-middle field reveals wheezing. Examination of the right-lower field reveals wheezing. Examination of the left-lower field reveals wheezing. Wheezing present. No rales.     Comments: Increased work of breathing Chest:     Chest wall: No tenderness.  Abdominal:     Palpations: Abdomen is soft.     Tenderness: There is no abdominal tenderness. There is no guarding.  Skin:    General:  Skin is warm and dry.     Coloration: Skin is not jaundiced or pale.     Findings: No rash.  Neurological:     General: No focal deficit present.     Mental Status: He is alert.     ED Results / Procedures / Treatments   Labs (all labs ordered are listed, but only abnormal results are displayed) Labs Reviewed  RESP PANEL BY RT-PCR (FLU A&B, COVID) [id number]  CBC WITH DIFFERENTIAL/PLATELET  COMPREHENSIVE METABOLIC PANEL    EKG None  Radiology No results found.  Procedures Procedures (including critical care time)  Medications Ordered in ED Medications  albuterol (PROVENTIL,VENTOLIN) solution continuous neb (has no administration in time range)  methylPREDNISolone sodium succinate (SOLU-MEDROL) 125 mg/2 mL injection 125 mg (125 mg Intravenous Given 12/20/19 1504)    ED Course  I have reviewed the triage vital signs and the nursing notes.  Pertinent labs & imaging results that were available during my care of the patient were reviewed by me and considered in my medical decision making (see chart for details).    MDM Rules/Calculators/A&P                          Robert Rich was evaluated in Emergency Department on 12/20/2019 for the symptoms described in the history of present illness. He was evaluated in the context of the global COVID-19 pandemic, which necessitated consideration that the patient might be at risk for infection with the SARS-CoV-2 virus that causes COVID-19. Institutional protocols and algorithms that pertain to the evaluation of patients at risk for COVID-19 are in a state of rapid change based on information released by regulatory bodies including the CDC and federal and state organizations. These policies and algorithms were followed during the patient's care in the ED.  Patient is a alert and oriented 54 year old male with a history of COPD, obesity, diabetes, and previous MI.  Patient complains of worsening shortness of breath and productive cough for  the past 2 days.  Patient was seen yesterday via telehealth and started on penicillin and azithromycin.  Patient reports minimal improvement with nebulizer treatment, guaifenesin and azithromycin.  Patient patient endorses fever, generalized myalgias, and loss of smell and taste.  Patient is alert, oriented ill-appearing.  Patient is tachypneic and he able to speak in short sentences while on O2 via [place]. Patient has diffuse wheezing in all lung fields.    Respiratory panel negative for COVID and influenza .  CXR, and labs pending.    Patient was treated with albuterol continuous nebulizer and Solu-Medrol.  Based on his respiratory distress and history of COPD, obesity, DM and CAD; patient will likely be a candidate for admission if his first of breath does not improve.  Patient care transferred to [facility], PA-C  at the end of my shift. Patient presentation, ED course, and plan of care discussed with review of all pertinent labs and imaging. Please see her note for further details regarding further ED course and disposition.      Final Clinical Impression(s) / ED Diagnoses Final diagnoses:  None    Rx / DC Orders ED Discharge Orders    None       Berneice Heinrich 12/20/19 1606    Eber Hong, MD 12/22/19 2057

## 2019-12-21 DIAGNOSIS — J441 Chronic obstructive pulmonary disease with (acute) exacerbation: Principal | ICD-10-CM

## 2019-12-21 DIAGNOSIS — Z794 Long term (current) use of insulin: Secondary | ICD-10-CM

## 2019-12-21 DIAGNOSIS — J9601 Acute respiratory failure with hypoxia: Secondary | ICD-10-CM

## 2019-12-21 DIAGNOSIS — E119 Type 2 diabetes mellitus without complications: Secondary | ICD-10-CM

## 2019-12-21 DIAGNOSIS — I169 Hypertensive crisis, unspecified: Secondary | ICD-10-CM

## 2019-12-21 LAB — HIV ANTIBODY (ROUTINE TESTING W REFLEX): HIV Screen 4th Generation wRfx: NONREACTIVE

## 2019-12-21 LAB — CBC WITH DIFFERENTIAL/PLATELET
Abs Immature Granulocytes: 0.24 10*3/uL — ABNORMAL HIGH (ref 0.00–0.07)
Basophils Absolute: 0 10*3/uL (ref 0.0–0.1)
Basophils Relative: 0 %
Eosinophils Absolute: 0 10*3/uL (ref 0.0–0.5)
Eosinophils Relative: 0 %
HCT: 42.4 % (ref 39.0–52.0)
Hemoglobin: 13.6 g/dL (ref 13.0–17.0)
Immature Granulocytes: 1 %
Lymphocytes Relative: 7 %
Lymphs Abs: 1.1 10*3/uL (ref 0.7–4.0)
MCH: 28 pg (ref 26.0–34.0)
MCHC: 32.1 g/dL (ref 30.0–36.0)
MCV: 87.4 fL (ref 80.0–100.0)
Monocytes Absolute: 1.1 10*3/uL — ABNORMAL HIGH (ref 0.1–1.0)
Monocytes Relative: 6 %
Neutro Abs: 15 10*3/uL — ABNORMAL HIGH (ref 1.7–7.7)
Neutrophils Relative %: 86 %
Platelets: 479 10*3/uL — ABNORMAL HIGH (ref 150–400)
RBC: 4.85 MIL/uL (ref 4.22–5.81)
RDW: 13.1 % (ref 11.5–15.5)
WBC: 17.5 10*3/uL — ABNORMAL HIGH (ref 4.0–10.5)
nRBC: 0 % (ref 0.0–0.2)

## 2019-12-21 LAB — COMPREHENSIVE METABOLIC PANEL
ALT: 20 U/L (ref 0–44)
AST: 17 U/L (ref 15–41)
Albumin: 4.5 g/dL (ref 3.5–5.0)
Alkaline Phosphatase: 55 U/L (ref 38–126)
Anion gap: 12 (ref 5–15)
BUN: 16 mg/dL (ref 6–20)
CO2: 25 mmol/L (ref 22–32)
Calcium: 9.2 mg/dL (ref 8.9–10.3)
Chloride: 99 mmol/L (ref 98–111)
Creatinine, Ser: 0.63 mg/dL (ref 0.61–1.24)
GFR, Estimated: >60 mL/min (ref >60)
Glucose, Bld: 241 mg/dL — ABNORMAL HIGH (ref 70–99)
Potassium: 5.2 mmol/L — ABNORMAL HIGH (ref 3.5–5.1)
Sodium: 136 mmol/L (ref 135–145)
Total Bilirubin: 0.3 mg/dL (ref 0.3–1.2)
Total Protein: 8.4 g/dL — ABNORMAL HIGH (ref 6.5–8.1)

## 2019-12-21 LAB — CBG MONITORING, ED
Glucose-Capillary: 174 mg/dL — ABNORMAL HIGH (ref 70–99)
Glucose-Capillary: 274 mg/dL — ABNORMAL HIGH (ref 70–99)
Glucose-Capillary: 289 mg/dL — ABNORMAL HIGH (ref 70–99)

## 2019-12-21 LAB — LIPID PANEL
Cholesterol: 154 mg/dL (ref 0–200)
HDL: 54 mg/dL (ref >40)
LDL Cholesterol: 83 mg/dL (ref 0–99)
Total CHOL/HDL Ratio: 2.9 RATIO
Triglycerides: 83 mg/dL (ref <150)
VLDL: 17 mg/dL (ref 0–40)

## 2019-12-21 LAB — HEMOGLOBIN A1C
Hgb A1c MFr Bld: 7.2 % — ABNORMAL HIGH (ref 4.8–5.6)
Mean Plasma Glucose: 159.94 mg/dL

## 2019-12-21 LAB — MAGNESIUM: Magnesium: 2.2 mg/dL (ref 1.7–2.4)

## 2019-12-21 LAB — BRAIN NATRIURETIC PEPTIDE: B Natriuretic Peptide: 55 pg/mL (ref 0.0–100.0)

## 2019-12-21 LAB — GLUCOSE, CAPILLARY
Glucose-Capillary: 310 mg/dL — ABNORMAL HIGH (ref 70–99)
Glucose-Capillary: 295 mg/dL — ABNORMAL HIGH (ref 70–99)

## 2019-12-21 LAB — TSH: TSH: 1.995 u[IU]/mL (ref 0.350–4.500)

## 2019-12-21 MED ORDER — BUTALBITAL-APAP-CAFFEINE 50-325-40 MG PO TABS
1.0000 | ORAL_TABLET | Freq: Four times a day (QID) | ORAL | Status: DC | PRN
  Administered 2019-12-22: 1 via ORAL
  Filled 2019-12-21: qty 1

## 2019-12-21 MED ORDER — LORAZEPAM 2 MG/ML IJ SOLN
0.5000 mg | INTRAMUSCULAR | Status: DC | PRN
  Administered 2019-12-21: 0.5 mg via INTRAVENOUS
  Filled 2019-12-21: qty 1

## 2019-12-21 MED ORDER — HEPARIN SODIUM (PORCINE) 5000 UNIT/ML IJ SOLN
5000.0000 [IU] | Freq: Three times a day (TID) | INTRAMUSCULAR | Status: DC
  Administered 2019-12-21 – 2019-12-24 (×8): 5000 [IU] via SUBCUTANEOUS
  Filled 2019-12-21 (×10): qty 1

## 2019-12-21 MED ORDER — IPRATROPIUM-ALBUTEROL 0.5-2.5 (3) MG/3ML IN SOLN
3.0000 mL | Freq: Four times a day (QID) | RESPIRATORY_TRACT | Status: DC
  Administered 2019-12-21 – 2019-12-24 (×13): 3 mL via RESPIRATORY_TRACT
  Filled 2019-12-21 (×13): qty 3

## 2019-12-21 MED ORDER — METOPROLOL TARTRATE 5 MG/5ML IV SOLN: 2.5000 mg | Freq: Four times a day (QID) | INTRAVENOUS | Status: DC | PRN

## 2019-12-21 MED ORDER — METHYLPREDNISOLONE SODIUM SUCC 125 MG IJ SOLR
60.0000 mg | Freq: Four times a day (QID) | INTRAMUSCULAR | Status: DC
  Administered 2019-12-21 – 2019-12-24 (×12): 60 mg via INTRAVENOUS
  Filled 2019-12-21 (×12): qty 2

## 2019-12-21 MED ORDER — IPRATROPIUM-ALBUTEROL 20-100 MCG/ACT IN AERS: 1.0000 | INHALATION_SPRAY | Freq: Four times a day (QID) | RESPIRATORY_TRACT | Status: DC

## 2019-12-21 MED ORDER — IPRATROPIUM-ALBUTEROL 0.5-2.5 (3) MG/3ML IN SOLN
3.0000 mL | Freq: Four times a day (QID) | RESPIRATORY_TRACT | Status: DC
  Administered 2019-12-21: 3 mL via RESPIRATORY_TRACT
  Filled 2019-12-21: qty 3

## 2019-12-21 NOTE — Progress Notes (Signed)
[facility]  PROGRESS NOTE  Robert Rich MWN:[id number] DOB: 08-Mar-1965 DOA: 12/20/2019 PCP: Rebecka Apley, NP   Brief HPI:   54 year old male with history of CVA, obesity, hypertension, hyperlipidemia, MI, diabetes mellitus type 2, COPD presented to ED with complaints of dyspnea and cough. Chest x-ray was unremarkable. Patient was admitted with COPD exacerbation. Started on duo nebs and Solu-Medrol in the ED. Later switched to BiPAP for worsening bleeding.    Subjective   Patient seen and examined, continues to have wheezing, currently on BiPAP.   Assessment/Plan:     1. Acute hypoxemic respiratory failure-patient presented with acute hypoxemic respiratory failure, O2 sats in high 80s and low 90s. Initially started on 4 L of oxygen, required BiPAP last night. VBG did not show hypercapnia. 2. Hypertensive crisis-patient presented with blood pressure 180/100. Continue metoprolol 2.5 mg IV every 6 hours as needed for BP more than 160/100. 3. COPD exacerbation-increase Solu-Medrol to 60 mg IV every 6 hours, DuoNeb scheduled every 6 hours, will try to wean off BiPAP. 4. Diabetes mellitus type 2-continue Lantus 30 units subcu twice daily, sliding scale insulin with NovoLog. CBG elevated due to Solu-Medrol. Will monitor and adjust insulin if continues to elevated next 24 hours.     COVID-19 Labs  No results for input(s): DDIMER, FERRITIN, LDH, CRP in the last 72 hours.  Lab Results  Component Value Date   SARSCOV2NAA NEGATIVE 12/20/2019   SARSCOV2NAA NEGATIVE 11/01/2019   SARSCOV2NAA NEGATIVE 10/28/2019   SARSCOV2NAA NEGATIVE 10/25/2019     Scheduled medications:   . atorvastatin  40 mg Oral q1800  . clopidogrel  75 mg Oral Daily  . heparin  5,000 Units Subcutaneous Q8H  . insulin aspart  0-15 Units Subcutaneous TID WC  . insulin aspart  0-5 Units Subcutaneous QHS  . insulin glargine  30 Units Subcutaneous BID  . ipratropium-albuterol  3 mL Nebulization Q6H  .  methylPREDNISolone (SOLU-MEDROL) injection  60 mg Intravenous Q6H  . montelukast  10 mg Oral QHS  . thiamine  100 mg Oral Daily         CBG: Recent Labs  Lab 12/20/19 1837 12/20/19 2359 12/21/19 0823 12/21/19 1205  GLUCAP 223* 274* 174* 289*    SpO2: 97 % O2 Flow Rate (L/min): 4 L/min FiO2 (%): 40 %    CBC: Recent Labs  Lab 12/20/19 1600 12/21/19 0441  WBC 15.4* 17.5*  NEUTROABS 11.3* 15.0*  HGB 13.4 13.6  HCT 41.0 42.4  MCV 86.7 87.4  PLT 444* 479*    Basic Metabolic Panel: Recent Labs  Lab 12/20/19 1600 12/20/19 2100 12/21/19 0441  NA 136  --  136  K 3.9  --  5.2*  CL 100  --  99  CO2 25  --  25  GLUCOSE 105*  --  241*  BUN 10  --  16  CREATININE 0.60*  --  0.63  CALCIUM 8.9  --  9.2  MG  --  1.9 2.2     Liver Function Tests: Recent Labs  Lab 12/20/19 1600 12/21/19 0441  AST 15 17  ALT 19 20  ALKPHOS 53 55  BILITOT 0.2* 0.3  PROT 7.7 8.4*  ALBUMIN 4.3 4.5     Antibiotics: Anti-infectives (From admission, onward)   Start     Dose/Rate Route Frequency Ordered Stop   12/20/19 2245  cefTRIAXone (ROCEPHIN) 1 g in sodium chloride 0.9 % 100 mL IVPB        1 g 200 mL/hr over  30 Minutes Intravenous Every 24 hours 12/20/19 2235 12/25/19 2244       DVT prophylaxis: Heparin  Code Status: Full code  Family Communication: No family at bedside   Consultants:  None  Procedures:  None    Objective   Vitals:   12/21/19 1430 12/21/19 1500 12/21/19 1530 12/21/19 1600  BP: (!) 142/104 (!) 156/93 101/83 102/77  Pulse: (!) 107 (!) 111 (!) 112 (!) 102  Resp: 19 19 (!) 22 20  Temp:      TempSrc:      SpO2: 93% 95% 95% 97%  Weight:      Height:       No intake or output data in the 24 hours ending 12/21/19 1639  No intake/output data recorded.  Filed Weights   12/20/19 1417  Weight: 90.7 kg    Physical Examination:    General-appears in no acute distress  Heart-S1-S2, regular, no murmur auscultated  Lungs-bilateral  wheezing auscultated  Abdomen-soft, nontender, no organomegaly  Extremities-no edema in the lower extremities  Neuro-alert, oriented x3, no focal deficit noted   Status is: Inpatient  Dispo: The patient is from: Home              Anticipated d/c is to: Home              Anticipated d/c date is: 12/26/2019              Patient currently not medically stable for discharge  Barrier to discharge-ongoing treatment for acute hypoxemic respiratory failure       Data Reviewed:   Recent Results (from the past 240 hour(s))  Resp Panel by RT-PCR (Flu A&B, Covid) Nasopharyngeal Swab     Status: None   Collection Time: 12/20/19  2:54 PM   Specimen: Nasopharyngeal Swab; Nasopharyngeal(NP) swabs in vial transport medium  Result Value Ref Range Status   SARS Coronavirus 2 by RT PCR NEGATIVE NEGATIVE Final    Comment: (NOTE) SARS-CoV-2 target nucleic acids are NOT DETECTED.  The SARS-CoV-2 RNA is generally detectable in upper respiratory specimens during the acute phase of infection. The lowest concentration of SARS-CoV-2 viral copies this assay can detect is 138 copies/mL. A negative result does not preclude SARS-Cov-2 infection and should not be used as the sole basis for treatment or other patient management decisions. A negative result may occur with  improper specimen collection/handling, submission of specimen other than nasopharyngeal swab, presence of viral mutation(s) within the areas targeted by this assay, and inadequate number of viral copies(<138 copies/mL). A negative result must be combined with clinical observations, patient history, and epidemiological information. The expected result is Negative.  Fact Sheet for Patients:  BloggerCourse.com  Fact Sheet for Healthcare Providers:  SeriousBroker.it  This test is no t yet approved or cleared by the [place] FDA and  has been authorized for detection and/or  diagnosis of SARS-CoV-2 by FDA under an Emergency Use Authorization (EUA). This EUA will remain  in effect (meaning this test can be used) for the duration of the COVID-19 declaration under Section 564(b)(1) of the Act, 21 U.S.C.section 360bbb-3(b)(1), unless the authorization is terminated  or revoked sooner.       Influenza A by PCR NEGATIVE NEGATIVE Final   Influenza B by PCR NEGATIVE NEGATIVE Final    Comment: (NOTE) The Xpert Xpress SARS-CoV-2/FLU/RSV plus assay is intended as an aid in the diagnosis of influenza from Nasopharyngeal swab specimens and should not be used as a sole basis for treatment.  Nasal washings and aspirates are unacceptable for Xpert Xpress SARS-CoV-2/FLU/RSV testing.  Fact Sheet for Patients: BloggerCourse.com  Fact Sheet for Healthcare Providers: SeriousBroker.it  This test is not yet approved or cleared by the [place] FDA and has been authorized for detection and/or diagnosis of SARS-CoV-2 by FDA under an Emergency Use Authorization (EUA). This EUA will remain in effect (meaning this test can be used) for the duration of the COVID-19 declaration under Section 564(b)(1) of the Act, 21 U.S.C. section 360bbb-3(b)(1), unless the authorization is terminated or revoked.  Performed at [facility], [address], [place], [place] [address]      BNP (last 3 results) Recent Labs    03/28/19 1126 07/18/19 1140 12/21/19 0441  BNP 70.0 54.0 55.0     Studies:  DG Chest Port 1 View  Result Date: 12/20/2019 CLINICAL DATA:  Shortness of breath and fever. EXAM: PORTABLE CHEST 1 VIEW COMPARISON:  November 01, 2019 FINDINGS: The heart size and mediastinal contours are within normal limits. Both lungs are clear. The visualized skeletal structures are unremarkable. IMPRESSION: No active disease. Electronically Signed   By: Aram Candela M.D.   On: 12/20/2019 16:30   DG Chest Port 1V same  Day  Result Date: 12/20/2019 CLINICAL DATA:  Chest pain EXAM: PORTABLE CHEST 1 VIEW COMPARISON:  12/20/2019, 11/01/2019 FINDINGS: The heart size and mediastinal contours are within normal limits. Both lungs are clear. The visualized skeletal structures are unremarkable. IMPRESSION: No active disease. Electronically Signed   By: Jasmine Pang M.D.   On: 12/20/2019 21:31       Flower Robert Rich S Truly Stankiewicz   [facility] If 7PM-7AM, please contact night-coverage at [email or web address], Office  [phone]   12/21/2019, 4:39 PM  LOS: 1 day

## 2019-12-21 NOTE — ED Notes (Signed)
Pt has hx of migraines. Pt takes at home for migraines and is effective. Fioricet  50-325-40mg . Dr aware.

## 2019-12-21 NOTE — ED Notes (Signed)
Pt given 0.5mg  Ativan IV d/t anxiety and breakfast plate given. Pt stable and will continue to monitor.

## 2019-12-21 NOTE — TOC Initial Note (Signed)
Transition of Care [facility]) - Initial/Assessment Note    Patient Details  Name: Robert Rich MRN: [id number] Date of Birth: [birth year]  [facility] CM/SW Contact:    Villa Herb, LCSWA Phone Number: 12/21/2019, 7:46 PM  Clinical Narrative:                 Pt admitted due to COPD exacerbation and acute respiratory failure with hypoxia. Pt high risk for readmission. CSW spoke with pt over the phone, pt states that is was difficult for him to do a lot of talking, CSW informed pt CSW could ask questions to family in room if he was agreeable, pt agreeable. CSW completed assessment with pts wife Robert Rich [phone]. Pt lives alone and is able to complete ADLs independently and drive. Pt has not used HH services nor does he use any DME. Pt is not on home O2 but has been placed on 4L currently. TOC to follow for possible d/c needs.   Expected Discharge Plan: Home/Self Care Barriers to Discharge: Continued Medical Work up   Patient Goals and CMS Choice Patient states their goals for this hospitalization and ongoing recovery are:: Return home   Choice offered to / list presented to : NA  Expected Discharge Plan and Services Expected Discharge Plan: Home/Self Care In-house Referral: NA Discharge Planning Services: NA Post Acute Care Choice: NA Living arrangements for the past 2 months: Single Family Home                                      Prior Living Arrangements/Services Living arrangements for the past 2 months: Single Family Home Lives with:: Self Patient language and need for interpreter reviewed:: Yes Do you feel safe going back to the place where you live?: Yes            Criminal Activity/Legal Involvement Pertinent to Current Situation/Hospitalization: No - Comment as needed  Activities of Daily Living Home Assistive Devices/Equipment: CBG Meter, Blood pressure cuff, Eyeglasses, Nebulizer ADL Screening (condition at time of  admission) Patient's cognitive ability adequate to safely complete daily activities?: Yes Is the patient deaf or have difficulty hearing?: No Does the patient have difficulty seeing, even when wearing glasses/contacts?: No Does the patient have difficulty concentrating, remembering, or making decisions?: No Patient able to express need for assistance with ADLs?: Yes Does the patient have difficulty dressing or bathing?: No Independently performs ADLs?: Yes (appropriate for developmental age) Does the patient have difficulty walking or climbing stairs?: No Weakness of Legs: None Weakness of Arms/Hands: None  Permission Sought/Granted                  Emotional Assessment Appearance:: Appears stated age Attitude/Demeanor/Rapport: Engaged Affect (typically observed): Accepting Orientation: : Oriented to Self, Oriented to Place, Oriented to  Time, Oriented to Situation Alcohol / Substance Use: Not Applicable Psych Involvement: No (comment)  Admission diagnosis:  COPD (chronic obstructive pulmonary disease) (HCC) [J44.9] SOB (shortness of breath) [R06.02] COPD exacerbation (HCC) [J44.1] Patient Active Problem List   Diagnosis Date Noted  . COPD (chronic obstructive pulmonary disease) (HCC) 12/20/2019  . Type 2 diabetes mellitus without complication (HCC) 12/20/2019  . COPD GOLD 0/  group B vs D  08/24/2019  . Multiple pulmonary nodules determined by computed tomography of lung 08/24/2019  . COPD exacerbation (HCC)   . COPD with acute exacerbation (HCC) 08/04/2019  . Coronary artery  disease   . HLD (hyperlipidemia)   . Leukocytosis   . Hyperglycemia   . Steroid-induced hyperglycemia   . Acute respiratory failure with hypoxia (HCC)   . Morbid obesity due to excess calories (HCC)   . Hypertensive crisis   . Anxiety   . Substance abuse (HCC)   . Cardiac arrest (HCC) 03/17/2019  . 2019 novel coronavirus disease (COVID-19) 12/07/2018  . Tobacco abuse 12/06/2018   PCP:   Rebecka Apley, NP Pharmacy:   [facility] [address], [place] - [phone] [place] [address] Phone: [phone] Fax: [phone]     Social Determinants of Health (SDOH) Interventions    Readmission Risk Interventions Readmission Risk Prevention Plan 08/05/2019  Transportation Screening Complete  Medication Review (RN Care Manager) Complete  HRI or Home Care Consult Complete  SW Recovery Care/Counseling Consult Complete  Palliative Care Screening Not Applicable  Skilled Nursing Facility Not Applicable  Some recent data might be hidden

## 2019-12-21 NOTE — Progress Notes (Signed)
**Note De-Identified Robert Rich Obfuscation** Patient removed from BIPAP and placed on 5 L [place]. Tolerating at this time.  RRT to continue to monitor

## 2019-12-22 LAB — BASIC METABOLIC PANEL
Anion gap: 12 (ref 5–15)
BUN: 23 mg/dL — ABNORMAL HIGH (ref 6–20)
CO2: 27 mmol/L (ref 22–32)
Calcium: 9.1 mg/dL (ref 8.9–10.3)
Chloride: 97 mmol/L — ABNORMAL LOW (ref 98–111)
Creatinine, Ser: 0.69 mg/dL (ref 0.61–1.24)
GFR, Estimated: >60 mL/min (ref >60)
Glucose, Bld: 258 mg/dL — ABNORMAL HIGH (ref 70–99)
Potassium: 4.3 mmol/L (ref 3.5–5.1)
Sodium: 136 mmol/L (ref 135–145)

## 2019-12-22 LAB — CBC
HCT: 40.1 % (ref 39.0–52.0)
Hemoglobin: 12.9 g/dL — ABNORMAL LOW (ref 13.0–17.0)
MCH: 28.4 pg (ref 26.0–34.0)
MCHC: 32.2 g/dL (ref 30.0–36.0)
MCV: 88.1 fL (ref 80.0–100.0)
Platelets: 449 10*3/uL — ABNORMAL HIGH (ref 150–400)
RBC: 4.55 MIL/uL (ref 4.22–5.81)
RDW: 13.5 % (ref 11.5–15.5)
WBC: 15 10*3/uL — ABNORMAL HIGH (ref 4.0–10.5)
nRBC: 0 % (ref 0.0–0.2)

## 2019-12-22 LAB — GLUCOSE, CAPILLARY
Glucose-Capillary: 251 mg/dL — ABNORMAL HIGH (ref 70–99)
Glucose-Capillary: 320 mg/dL — ABNORMAL HIGH (ref 70–99)
Glucose-Capillary: 348 mg/dL — ABNORMAL HIGH (ref 70–99)
Glucose-Capillary: 433 mg/dL — ABNORMAL HIGH (ref 70–99)

## 2019-12-22 MED ORDER — INSULIN ASPART 100 UNIT/ML ~~LOC~~ SOLN
5.0000 [IU] | Freq: Three times a day (TID) | SUBCUTANEOUS | Status: DC
  Administered 2019-12-22 – 2019-12-24 (×6): 5 [IU] via SUBCUTANEOUS

## 2019-12-22 MED ORDER — GUAIFENESIN ER 600 MG PO TB12
1200.0000 mg | ORAL_TABLET | Freq: Two times a day (BID) | ORAL | Status: DC
  Administered 2019-12-22 – 2019-12-24 (×5): 1200 mg via ORAL
  Filled 2019-12-22 (×5): qty 2

## 2019-12-22 NOTE — Progress Notes (Signed)
[facility]  PROGRESS NOTE  Robert Rich ZJI:[id number] DOB: 24-Jun-1965 DOA: 12/20/2019 PCP: Rebecka Apley, NP   Brief HPI:   54 year old male with history of CVA, obesity, hypertension, hyperlipidemia, MI, diabetes mellitus type 2, COPD presented to ED with complaints of dyspnea and cough. Chest x-ray was unremarkable. Patient was admitted with COPD exacerbation. Started on duo nebs and Solu-Medrol in the ED. Later switched to BiPAP for worsening bleeding.   Subjective   Patient seen and examined, continues to have wheezing.   Assessment/Plan:    1. Acute hypoxemic respiratory failure-patient presented with acute hypoxemic respiratory failure, O2 sats in high 80s and low 90s. Initially started on 4 L of oxygen, required BiPAP last night. VBG did not show hypercapnia.  Continue Solu-Medrol 60 mg IV every 6 hours, DuoNeb nebulizer every 6 hours. 2. Hypertensive crisis-patient presented with blood pressure 180/100. Continue metoprolol 2.5 mg IV every 6 hours as needed for BP more than 160/100. 3. COPD exacerbation-continue Solu-Medrol  60 mg IV every 6 hours, DuoNeb scheduled every 6 hours, Mucinex 1 tablet p.o. twice daily 4. Diabetes mellitus type 2-continue Lantus 30 units subcu twice daily, sliding scale insulin with NovoLog. CBG elevated due to Solu-Medrol.  We will start NovoLog 5 units 3 times daily meal coverage. 5. CAD-continue Plavix, atorvastatin.     COVID-19 Labs  No results for input(s): DDIMER, FERRITIN, LDH, CRP in the last 72 hours.  Lab Results  Component Value Date   SARSCOV2NAA NEGATIVE 12/20/2019   SARSCOV2NAA NEGATIVE 11/01/2019   SARSCOV2NAA NEGATIVE 10/28/2019   SARSCOV2NAA NEGATIVE 10/25/2019     Scheduled medications:    atorvastatin  40 mg Oral q1800   clopidogrel  75 mg Oral Daily   guaiFENesin  1,200 mg Oral BID   heparin  5,000 Units Subcutaneous Q8H   insulin aspart  0-15 Units Subcutaneous TID WC   insulin aspart  0-5  Units Subcutaneous QHS   insulin glargine  30 Units Subcutaneous BID   ipratropium-albuterol  3 mL Nebulization Q6H   methylPREDNISolone (SOLU-MEDROL) injection  60 mg Intravenous Q6H   montelukast  10 mg Oral QHS   thiamine  100 mg Oral Daily      CBG: Recent Labs  Lab 12/21/19 1205 12/21/19 1829 12/21/19 2054 12/22/19 0730 12/22/19 1106  GLUCAP 289* 310* 295* 251* 320*    SpO2: 98 % O2 Flow Rate (L/min): 2 L/min FiO2 (%): 40 %    CBC: Recent Labs  Lab 12/20/19 1600 12/21/19 0441 12/22/19 0647  WBC 15.4* 17.5* 15.0*  NEUTROABS 11.3* 15.0*  --   HGB 13.4 13.6 12.9*  HCT 41.0 42.4 40.1  MCV 86.7 87.4 88.1  PLT 444* 479* 449*    Basic Metabolic Panel: Recent Labs  Lab 12/20/19 1600 12/20/19 2100 12/21/19 0441 12/22/19 0647  NA 136  --  136 136  K 3.9  --  5.2* 4.3  CL 100  --  99 97*  CO2 25  --  25 27  GLUCOSE 105*  --  241* 258*  BUN 10  --  16 23*  CREATININE 0.60*  --  0.63 0.69  CALCIUM 8.9  --  9.2 9.1  MG  --  1.9 2.2  --      Liver Function Tests: Recent Labs  Lab 12/20/19 1600 12/21/19 0441  AST 15 17  ALT 19 20  ALKPHOS 53 55  BILITOT 0.2* 0.3  PROT 7.7 8.4*  ALBUMIN 4.3 4.5     Antibiotics: Anti-infectives (  From admission, onward)   Start     Dose/Rate Route Frequency Ordered Stop   12/20/19 2245  cefTRIAXone (ROCEPHIN) 1 g in sodium chloride 0.9 % 100 mL IVPB        1 g 200 mL/hr over 30 Minutes Intravenous Every 24 hours 12/20/19 2235 12/25/19 2244       DVT prophylaxis: Heparin  Code Status: Full code  Family Communication: No family at bedside   Consultants:  None  Procedures:  None    Objective   Vitals:   12/22/19 0629 12/22/19 0741 12/22/19 1422 12/22/19 1452  BP: 133/83  128/64   Pulse: 75  83   Resp: 18  18   Temp: 98 F (36.7 C)  97.9 F (36.6 C)   TempSrc: Oral  Oral   SpO2: 98% 95% 96% 98%  Weight:      Height:        Intake/Output Summary (Last 24 hours) at 12/22/2019  1532 Last data filed at 12/22/2019 1045 Gross per 24 hour  Intake 600 ml  Output 1650 ml  Net -1050 ml    11/23 1901 - 11/25 0700 In: 600 [P.O.:250] Out: 1350 [Urine:1350]  Filed Weights   12/20/19 1417  Weight: 90.7 kg    Physical Examination:   General-appears in no acute distress  Heart-S1-S2, regular, no murmur auscultated  Lungs- bilateral wheezing auscultated  Abdomen-soft, nontender, no organomegaly  Extremities-no edema in the lower extremities  Neuro-alert, oriented x3, no focal deficit noted   Status is: Inpatient  Dispo: The patient is from: Home              Anticipated d/c is to: Home              Anticipated d/c date is: 12/26/2019              Patient currently not medically stable for discharge  Barrier to discharge-ongoing treatment for acute hypoxemic respiratory failure       Data Reviewed:   Recent Results (from the past 240 hour(s))  Resp Panel by RT-PCR (Flu A&B, Covid) Nasopharyngeal Swab     Status: None   Collection Time: 12/20/19  2:54 PM   Specimen: Nasopharyngeal Swab; Nasopharyngeal(NP) swabs in vial transport medium  Result Value Ref Range Status   SARS Coronavirus 2 by RT PCR NEGATIVE NEGATIVE Final    Comment: (NOTE) SARS-CoV-2 target nucleic acids are NOT DETECTED.  The SARS-CoV-2 RNA is generally detectable in upper respiratory specimens during the acute phase of infection. The lowest concentration of SARS-CoV-2 viral copies this assay can detect is 138 copies/mL. A negative result does not preclude SARS-Cov-2 infection and should not be used as the sole basis for treatment or other patient management decisions. A negative result may occur with  improper specimen collection/handling, submission of specimen other than nasopharyngeal swab, presence of viral mutation(s) within the areas targeted by this assay, and inadequate number of viral copies(<138 copies/mL). A negative result must be combined with clinical  observations, patient history, and epidemiological information. The expected result is Negative.  Fact Sheet for Patients:  BloggerCourse.com  Fact Sheet for Healthcare Providers:  SeriousBroker.it  This test is no t yet approved or cleared by the [place] FDA and  has been authorized for detection and/or diagnosis of SARS-CoV-2 by FDA under an Emergency Use Authorization (EUA). This EUA will remain  in effect (meaning this test can be used) for the duration of the COVID-19 declaration under Section 564(b)(1) of the  Act, 21 U.S.C.section 360bbb-3(b)(1), unless the authorization is terminated  or revoked sooner.       Influenza A by PCR NEGATIVE NEGATIVE Final   Influenza B by PCR NEGATIVE NEGATIVE Final    Comment: (NOTE) The Xpert Xpress SARS-CoV-2/FLU/RSV plus assay is intended as an aid in the diagnosis of influenza from Nasopharyngeal swab specimens and should not be used as a sole basis for treatment. Nasal washings and aspirates are unacceptable for Xpert Xpress SARS-CoV-2/FLU/RSV testing.  Fact Sheet for Patients: BloggerCourse.com  Fact Sheet for Healthcare Providers: SeriousBroker.it  This test is not yet approved or cleared by the [place] FDA and has been authorized for detection and/or diagnosis of SARS-CoV-2 by FDA under an Emergency Use Authorization (EUA). This EUA will remain in effect (meaning this test can be used) for the duration of the COVID-19 declaration under Section 564(b)(1) of the Act, 21 U.S.C. section 360bbb-3(b)(1), unless the authorization is terminated or revoked.  Performed at [facility], [address] [place], [place] [address]      BNP (last 3 results) Recent Labs    03/28/19 1126 07/18/19 1140 12/21/19 0441  BNP 70.0 54.0 55.0     Studies:  DG Chest Port 1 View  Result Date: 12/20/2019 CLINICAL DATA:  Shortness  of breath and fever. EXAM: PORTABLE CHEST 1 VIEW COMPARISON:  November 01, 2019 FINDINGS: The heart size and mediastinal contours are within normal limits. Both lungs are clear. The visualized skeletal structures are unremarkable. IMPRESSION: No active disease. Electronically Signed   By: Aram Candela M.D.   On: 12/20/2019 16:30   DG Chest Port 1V same Day  Result Date: 12/20/2019 CLINICAL DATA:  Chest pain EXAM: PORTABLE CHEST 1 VIEW COMPARISON:  12/20/2019, 11/01/2019 FINDINGS: The heart size and mediastinal contours are within normal limits. Both lungs are clear. The visualized skeletal structures are unremarkable. IMPRESSION: No active disease. Electronically Signed   By: Jasmine Pang M.D.   On: 12/20/2019 21:31       Tyion Boylen S Marquel Spoto   [facility] If 7PM-7AM, please contact night-coverage at [email or web address], Office  [phone]   12/22/2019, 3:32 PM  LOS: 2 days

## 2019-12-23 LAB — GLUCOSE, CAPILLARY
Glucose-Capillary: 413 mg/dL — ABNORMAL HIGH (ref 70–99)
Glucose-Capillary: 290 mg/dL — ABNORMAL HIGH (ref 70–99)
Glucose-Capillary: 268 mg/dL — ABNORMAL HIGH (ref 70–99)
Glucose-Capillary: 357 mg/dL — ABNORMAL HIGH (ref 70–99)
Glucose-Capillary: 317 mg/dL — ABNORMAL HIGH (ref 70–99)
Glucose-Capillary: 305 mg/dL — ABNORMAL HIGH (ref 70–99)
Glucose-Capillary: 343 mg/dL — ABNORMAL HIGH (ref 70–99)

## 2019-12-23 LAB — GLUCOSE, RANDOM: Glucose, Bld: 445 mg/dL — ABNORMAL HIGH (ref 70–99)

## 2019-12-23 MED ORDER — INSULIN ASPART 100 UNIT/ML ~~LOC~~ SOLN
3.0000 [IU] | Freq: Once | SUBCUTANEOUS | Status: AC
  Administered 2019-12-23: 3 [IU] via SUBCUTANEOUS

## 2019-12-23 NOTE — Progress Notes (Signed)
Inpatient Diabetes Program Recommendations  AACE/ADA: New Consensus Statement on Inpatient Glycemic Control   Target Ranges:  Prepandial:   less than 140 mg/dL      Peak postprandial:   less than 180 mg/dL (1-2 hours)      Critically ill patients:  140 - 180 mg/dL   Results for Robert Rich, Robert Rich (MRN [id number]) as of 12/23/2019 08:13  Ref. Range 12/22/2019 07:30 12/22/2019 11:06 12/22/2019 16:41 12/22/2019 21:35 12/23/2019 02:09 12/23/2019 07:26  Glucose-Capillary Latest Ref Range: 70 - 99 mg/dL 150 (H) 569 (H) 794 (H) 433 (H) 290 (H) 268 (H)   Review of Glycemic Control  Diabetes history: DM2 Outpatient Diabetes medications: 70/30 50 units BID, Metformin 1000 mg BID Current orders for Inpatient glycemic control: Lantus 30 units BID, Novolog 0-15 units TID with meals, Novolog 0-5 units QHS, Novolog 5 units TID with meals; Solumedrol 60 mg Q6H  Inpatient Diabetes Program Recommendations:    Insulin: If steroids are continued as ordered, please consider increasing Lantus to 40 units BID.  Thanks, Robert Penner, RN, MSN, CDE Diabetes Coordinator Inpatient Diabetes Program [phone] (Team Pager from 8am to 5pm)

## 2019-12-23 NOTE — Progress Notes (Signed)
Elevated glucose 445 via lab draw after receiving sliding scale and Lantus. Notified [facility] orders received and implemented.

## 2019-12-23 NOTE — Progress Notes (Signed)
[facility]  PROGRESS NOTE  NHIA HEAPHY WLN:[id number] DOB: 1965-12-01 DOA: 12/20/2019 PCP: Rebecka Apley, NP   Brief HPI:   54 year old male with history of CVA, obesity, hypertension, hyperlipidemia, MI, diabetes mellitus type 2, COPD presented to ED with complaints of dyspnea and cough. Chest x-ray was unremarkable. Patient was admitted with COPD exacerbation. Started on duo nebs and Solu-Medrol in the ED. Later switched to BiPAP for worsening bleeding.   Subjective   Patient seen and examined, breathing is improved.  Still continues to have wheezing.  Not requiring oxygen anymore.   Assessment/Plan:    1. Acute hypoxemic respiratory failure-significant improved, patient presented with acute hypoxemic respiratory failure, O2 sats in high 80s and low 90s. Initially started on 4 L of oxygen, required BiPAP last night. VBG did not show hypercapnia.  Continue DuoNeb nebulizer every 6 hours, will cut down the Solu-Medrol to 60 mg IV every 12 hours.   2. Hypertensive crisis-patient presented with blood pressure 180/100. Continue metoprolol 2.5 mg IV every 6 hours as needed for BP more than 160/100. 3. COPD exacerbation-continue  DuoNeb scheduled every 6 hours, Mucinex 1 tablet p.o. twice daily.  Will cut down Solu-Medrol to 60 mg IV every 12 hours. 4. Diabetes mellitus type 2-continue Lantus 30 units subcu twice daily, sliding scale insulin with NovoLog.  CBG elevated due to Solu-Medrol.  Dose of Solu-Medrol has been reduced as above.  We will start NovoLog 5 units 3 times daily meal coverage. 5. CAD-continue Plavix, atorvastatin.     COVID-19 Labs  No results for input(s): DDIMER, FERRITIN, LDH, CRP in the last 72 hours.  Lab Results  Component Value Date   SARSCOV2NAA NEGATIVE 12/20/2019   SARSCOV2NAA NEGATIVE 11/01/2019   SARSCOV2NAA NEGATIVE 10/28/2019   SARSCOV2NAA NEGATIVE 10/25/2019     Scheduled medications:   . atorvastatin  40 mg Oral q1800  .  clopidogrel  75 mg Oral Daily  . guaiFENesin  1,200 mg Oral BID  . heparin  5,000 Units Subcutaneous Q8H  . insulin aspart  0-15 Units Subcutaneous TID WC  . insulin aspart  0-5 Units Subcutaneous QHS  . insulin aspart  5 Units Subcutaneous TID WC  . insulin glargine  30 Units Subcutaneous BID  . ipratropium-albuterol  3 mL Nebulization Q6H  . methylPREDNISolone (SOLU-MEDROL) injection  60 mg Intravenous Q6H  . montelukast  10 mg Oral QHS  . thiamine  100 mg Oral Daily      CBG: Recent Labs  Lab 12/22/19 2135 12/22/19 2257 12/23/19 0209 12/23/19 0726 12/23/19 1125  GLUCAP 433* 413* 290* 268* 357*    SpO2: 93 % O2 Flow Rate (L/min): 1 L/min FiO2 (%): 40 %    CBC: Recent Labs  Lab 12/20/19 1600 12/21/19 0441 12/22/19 0647  WBC 15.4* 17.5* 15.0*  NEUTROABS 11.3* 15.0*  --   HGB 13.4 13.6 12.9*  HCT 41.0 42.4 40.1  MCV 86.7 87.4 88.1  PLT 444* 479* 449*    Basic Metabolic Panel: Recent Labs  Lab 12/20/19 1600 12/20/19 2100 12/21/19 0441 12/22/19 0647 12/22/19 2331  NA 136  --  136 136  --   K 3.9  --  5.2* 4.3  --   CL 100  --  99 97*  --   CO2 25  --  25 27  --   GLUCOSE 105*  --  241* 258* 445*  BUN 10  --  16 23*  --   CREATININE 0.60*  --  0.63 0.69  --  CALCIUM 8.9  --  9.2 9.1  --   MG  --  1.9 2.2  --   --      Liver Function Tests: Recent Labs  Lab 12/20/19 1600 12/21/19 0441  AST 15 17  ALT 19 20  ALKPHOS 53 55  BILITOT 0.2* 0.3  PROT 7.7 8.4*  ALBUMIN 4.3 4.5     Antibiotics: Anti-infectives (From admission, onward)   Start     Dose/Rate Route Frequency Ordered Stop   12/20/19 2245  cefTRIAXone (ROCEPHIN) 1 g in sodium chloride 0.9 % 100 mL IVPB        1 g 200 mL/hr over 30 Minutes Intravenous Every 24 hours 12/20/19 2235 12/25/19 2244       DVT prophylaxis: Heparin  Code Status: Full code  Family Communication: No family at bedside   Consultants:  None  Procedures:  None    Objective   Vitals:    12/23/19 0527 12/23/19 0706 12/23/19 1319 12/23/19 1422  BP: 134/76   136/80  Pulse: 73   89  Resp: 18   18  Temp: 97.9 F (36.6 C)   98.5 F (36.9 C)  TempSrc:    Oral  SpO2: 94% 98% 97% 93%  Weight:      Height:        Intake/Output Summary (Last 24 hours) at 12/23/2019 1459 Last data filed at 12/23/2019 1450 Gross per 24 hour  Intake 1200 ml  Output 3550 ml  Net -2350 ml    11/24 1901 - 11/26 0700 In: 1080 [P.O.:730] Out: 4250 [Urine:4250]  Filed Weights   12/20/19 1417 12/23/19 0500  Weight: 90.7 kg 108.5 kg    Physical Examination:   General-appears in no acute distress  Heart-S1-S2, regular, no murmur auscultated  Lungs-scattered wheezing bilaterally  Abdomen-soft, nontender, no organomegaly  Extremities-no edema in the lower extremities  Neuro-alert, oriented x3, no focal deficit noted   Status is: Inpatient  Dispo: The patient is from: Home              Anticipated d/c is to: Home              Anticipated d/c date is: 12/26/2019              Patient currently not medically stable for discharge  Barrier to discharge-ongoing treatment for acute hypoxemic respiratory failure       Data Reviewed:   Recent Results (from the past 240 hour(s))  Resp Panel by RT-PCR (Flu A&B, Covid) Nasopharyngeal Swab     Status: None   Collection Time: 12/20/19  2:54 PM   Specimen: Nasopharyngeal Swab; Nasopharyngeal(NP) swabs in vial transport medium  Result Value Ref Range Status   SARS Coronavirus 2 by RT PCR NEGATIVE NEGATIVE Final    Comment: (NOTE) SARS-CoV-2 target nucleic acids are NOT DETECTED.  The SARS-CoV-2 RNA is generally detectable in upper respiratory specimens during the acute phase of infection. The lowest concentration of SARS-CoV-2 viral copies this assay can detect is 138 copies/mL. A negative result does not preclude SARS-Cov-2 infection and should not be used as the sole basis for treatment or other patient management decisions. A  negative result may occur with  improper specimen collection/handling, submission of specimen other than nasopharyngeal swab, presence of viral mutation(s) within the areas targeted by this assay, and inadequate number of viral copies(<138 copies/mL). A negative result must be combined with clinical observations, patient history, and epidemiological information. The expected result is Negative.  Fact Sheet  for Patients:  BloggerCourse.com  Fact Sheet for Healthcare Providers:  SeriousBroker.it  This test is no t yet approved or cleared by the [place] FDA and  has been authorized for detection and/or diagnosis of SARS-CoV-2 by FDA under an Emergency Use Authorization (EUA). This EUA will remain  in effect (meaning this test can be used) for the duration of the COVID-19 declaration under Section 564(b)(1) of the Act, 21 U.S.C.section 360bbb-3(b)(1), unless the authorization is terminated  or revoked sooner.       Influenza A by PCR NEGATIVE NEGATIVE Final   Influenza B by PCR NEGATIVE NEGATIVE Final    Comment: (NOTE) The Xpert Xpress SARS-CoV-2/FLU/RSV plus assay is intended as an aid in the diagnosis of influenza from Nasopharyngeal swab specimens and should not be used as a sole basis for treatment. Nasal washings and aspirates are unacceptable for Xpert Xpress SARS-CoV-2/FLU/RSV testing.  Fact Sheet for Patients: BloggerCourse.com  Fact Sheet for Healthcare Providers: SeriousBroker.it  This test is not yet approved or cleared by the [place] FDA and has been authorized for detection and/or diagnosis of SARS-CoV-2 by FDA under an Emergency Use Authorization (EUA). This EUA will remain in effect (meaning this test can be used) for the duration of the COVID-19 declaration under Section 564(b)(1) of the Act, 21 U.S.C. section 360bbb-3(b)(1), unless the authorization  is terminated or revoked.  Performed at [facility], [address] [place], [place] [address]      BNP (last 3 results) Recent Labs    03/28/19 1126 07/18/19 1140 12/21/19 0441  BNP 70.0 54.0 55.0     Studies:  No results found.     Meredeth Ide   [facility] If 7PM-7AM, please contact night-coverage at [email or web address], Office  [phone]   12/23/2019, 2:59 PM  LOS: 3 days

## 2019-12-23 NOTE — Progress Notes (Signed)
Bipap is PRN order.  Patient is on RA and is stable but still having some wheezingt.  Bipap not needed at this time.

## 2019-12-24 DIAGNOSIS — I10 Essential (primary) hypertension: Secondary | ICD-10-CM

## 2019-12-24 LAB — CBC
HCT: 43.2 % (ref 39.0–52.0)
Hemoglobin: 13.9 g/dL (ref 13.0–17.0)
MCH: 27.8 pg (ref 26.0–34.0)
MCHC: 32.2 g/dL (ref 30.0–36.0)
MCV: 86.4 fL (ref 80.0–100.0)
Platelets: 485 10*3/uL — ABNORMAL HIGH (ref 150–400)
RBC: 5 MIL/uL (ref 4.22–5.81)
RDW: 13 % (ref 11.5–15.5)
WBC: 12.8 10*3/uL — ABNORMAL HIGH (ref 4.0–10.5)
nRBC: 0 % (ref 0.0–0.2)

## 2019-12-24 LAB — GLUCOSE, CAPILLARY
Glucose-Capillary: 267 mg/dL — ABNORMAL HIGH (ref 70–99)
Glucose-Capillary: 211 mg/dL — ABNORMAL HIGH (ref 70–99)

## 2019-12-24 MED ORDER — LOSARTAN POTASSIUM 25 MG PO TABS: 25.0000 mg | ORAL_TABLET | Freq: Every day | ORAL | 2 refills | Status: DC

## 2019-12-24 MED ORDER — PREDNISONE 10 MG PO TABS: ORAL_TABLET | ORAL | 0 refills | Status: DC

## 2019-12-24 MED ORDER — GUAIFENESIN ER 600 MG PO TB12: 1200.0000 mg | ORAL_TABLET | Freq: Two times a day (BID) | ORAL | 0 refills | Status: AC

## 2019-12-24 NOTE — Progress Notes (Signed)
Nsg Discharge Note  Admit Date:  12/20/2019 Discharge date: 12/24/2019   Glory Buff to be D/C'd Home per MD order.  AVS completed.  Copy for chart, and copy for patient signed, and dated. Patient/caregiver able to verbalize understanding.  Discharge Medication: Allergies as of 12/24/2019      Reactions   Asa [aspirin] Hives   Zolmitriptan Nausea And Vomiting, Other (See Comments)   Severe headaches      Medication List    STOP taking these medications   azithromycin 500 MG tablet Commonly known as: ZITHROMAX   dextromethorphan-guaiFENesin 10-100 MG/5ML liquid Commonly known as: ROBITUSSIN-DM   ipratropium 0.02 % nebulizer solution Commonly known as: ATROVENT     TAKE these medications   albuterol (2.5 MG/3ML) 0.083% nebulizer solution Commonly known as: PROVENTIL Take 3 mLs (2.5 mg total) by nebulization every 4 (four) hours as needed for wheezing or shortness of breath.   atorvastatin 40 MG tablet Commonly known as: LIPITOR Take 1 tablet (40 mg total) by mouth daily at 6 PM.   butalbital-acetaminophen-caffeine 50-325-40 MG tablet Commonly known as: FIORICET Take 1 tablet by mouth every 6 (six) hours as needed for migraine.   clopidogrel 75 MG tablet Commonly known as: PLAVIX Take 1 tablet (75 mg total) by mouth daily.   folic acid 1 MG tablet Commonly known as: FOLVITE Take 1 tablet (1 mg total) by mouth daily.   guaiFENesin 600 MG 12 hr tablet Commonly known as: MUCINEX Take 2 tablets (1,200 mg total) by mouth 2 (two) times daily for 7 days.   ipratropium-albuterol 0.5-2.5 (3) MG/3ML Soln Commonly known as: DUONEB Inhale 3 mLs into the lungs 4 (four) times daily as needed.   losartan 25 MG tablet Commonly known as: COZAAR Take 1 tablet (25 mg total) by mouth daily.   metFORMIN 1000 MG tablet Commonly known as: Glucophage Take 1 tablet (1,000 mg total) by mouth 2 (two) times daily with a meal.   metoprolol succinate 25 MG 24 hr tablet Commonly  known as: TOPROL-XL Take 1 tablet (25 mg total) by mouth daily.   montelukast 10 MG tablet Commonly known as: SINGULAIR Take 1 tablet (10 mg total) by mouth at bedtime.   multivitamin with minerals Tabs tablet Take 1 tablet by mouth daily.   NovoLIN 70/30 (70-30) 100 UNIT/ML injection Generic drug: insulin NPH-regular Human Inject 50 Units into the skin 2 (two) times daily with a meal.   predniSONE 10 MG tablet Commonly known as: DELTASONE Prednisone 40 mg po daily x 1 day the, 30 mg po daily x 1 day then,  20 mg po daily x 1 day then Prednisone 10 mg daily x 1 day then stop... What changed:   medication strength  how much to take  how to take this  when to take this  additional instructions   thiamine 100 MG tablet Take 2.5 tablets (250 mg total) by mouth daily. What changed: how much to take   umeclidinium-vilanterol 62.5-25 MCG/INH Aepb Commonly known as: ANORO ELLIPTA Inhale 1 puff into the lungs daily.   Vitamin D (Ergocalciferol) 1.25 MG (50000 UNIT) Caps capsule Commonly known as: DRISDOL Take 1 capsule (50,000 Units total) by mouth every 7 (seven) days.       Discharge Assessment: Vitals:   12/24/19 0612 12/24/19 0824  BP: (!) 149/85   Pulse: 77   Resp: 18   Temp: 98 F (36.7 C)   SpO2: 94% 96%   Skin clean, dry and intact without evidence  of skin break down, no evidence of skin tears noted. IV catheter discontinued intact. Site without signs and symptoms of complications - no redness or edema noted at insertion site, patient denies c/o pain - only slight tenderness at site.  Dressing with slight pressure applied.  D/c Instructions-Education: Discharge instructions given to patient/family with verbalized understanding. D/c education completed with patient/family including follow up instructions, medication list, d/c activities limitations if indicated, with other d/c instructions as indicated by MD - patient able to verbalize understanding, all  questions fully answered. Patient instructed to return to ED, call 911, or call MD for any changes in condition.  Patient escorted via WC, and D/C home via private auto.  Lonn [place], RN 12/24/2019 11:49 AM

## 2019-12-24 NOTE — Discharge Summary (Signed)
Physician Discharge Summary  Robert Rich ZOX:[id number] DOB: 03-Jan-1966 DOA: 12/20/2019  PCP: Rebecka Apley, NP  Admit date: 12/20/2019 Discharge date: 12/24/2019  Time spent: 50 minutes  Recommendations for Outpatient Follow-up:  1. Follow-up PCP in 2 weeks   Discharge Diagnoses:  Active Problems:   Acute respiratory failure with hypoxia (HCC)   Hypertensive crisis   COPD exacerbation (HCC)   COPD (chronic obstructive pulmonary disease) (HCC)   Type 2 diabetes mellitus without complication (HCC)   Discharge Condition: Stable  Diet recommendation: Heart healthy diet  Filed Weights   12/20/19 1417 12/23/19 0500 12/24/19 0500  Weight: 90.7 kg 108.5 kg 110 kg    History of present illness:  54 year old male with history of CVA, obesity, hypertension, hyperlipidemia, MI, diabetes mellitus type 2, COPD presented to ED with complaints of dyspnea and cough. Chest x-ray was unremarkable. Patient was admitted with COPD exacerbation. Started on duo nebs and Solu-Medrol in the ED. Later switched to BiPAP for worsening bleeding.   Hospital Course:   1. Acute hypoxemic respiratory failure-significant improved, patient presented with acute hypoxemic respiratory failure, O2 sats in high 80s and low 90s. Initially started on 4 L of oxygen, required BiPAP last night. VBG did not show hypercapnia.    Patient is not on oxygen at this time.  He still has some wheezing.  Will discharge home on prednisone taper.  Patient has completed 5 days of antibiotics in the hospital.     2. Hypertensive crisis-patient presented with blood pressure 180/100.  He was started on metoprolol 2.5 mg IV every 6 hours as needed for BP more than 160/100.  Resolved at this time.  Patient to go home with home regimen of metoprolol and losartan. 3. COPD exacerbation-continue   home regimen.  Started on prednisone taper as above.  We will also discharge on Mucinex 1 tablet p.o. twice daily for 7 days.    4. Diabetes mellitus type 2-continue home regimen.   5. CAD-continue Plavix, atorvastatin.   Procedures:    Consultations:    Discharge Exam: Vitals:   12/24/19 0612 12/24/19 0824  BP: (!) 149/85   Pulse: 77   Resp: 18   Temp: 98 F (36.7 C)   SpO2: 94% 96%    General: Appears in no acute distress S1-S2, regular Cardiovascular: S1-S2, regular Respiratory: Bilateral wheezing  Discharge Instructions   Discharge Instructions    Diet - low sodium heart healthy   Complete by: As directed    Increase activity slowly   Complete by: As directed      Allergies as of 12/24/2019      Reactions   Asa [aspirin] Hives   Zolmitriptan Nausea And Vomiting, Other (See Comments)   Severe headaches      Medication List    STOP taking these medications   azithromycin 500 MG tablet Commonly known as: ZITHROMAX   dextromethorphan-guaiFENesin 10-100 MG/5ML liquid Commonly known as: ROBITUSSIN-DM   ipratropium 0.02 % nebulizer solution Commonly known as: ATROVENT     TAKE these medications   albuterol (2.5 MG/3ML) 0.083% nebulizer solution Commonly known as: PROVENTIL Take 3 mLs (2.5 mg total) by nebulization every 4 (four) hours as needed for wheezing or shortness of breath.   atorvastatin 40 MG tablet Commonly known as: LIPITOR Take 1 tablet (40 mg total) by mouth daily at 6 PM.   butalbital-acetaminophen-caffeine 50-325-40 MG tablet Commonly known as: FIORICET Take 1 tablet by mouth every 6 (six) hours as needed for migraine.  clopidogrel 75 MG tablet Commonly known as: PLAVIX Take 1 tablet (75 mg total) by mouth daily.   folic acid 1 MG tablet Commonly known as: FOLVITE Take 1 tablet (1 mg total) by mouth daily.   guaiFENesin 600 MG 12 hr tablet Commonly known as: MUCINEX Take 2 tablets (1,200 mg total) by mouth 2 (two) times daily for 7 days.   ipratropium-albuterol 0.5-2.5 (3) MG/3ML Soln Commonly known as: DUONEB Inhale 3 mLs into the lungs 4  (four) times daily as needed.   losartan 25 MG tablet Commonly known as: COZAAR Take 1 tablet (25 mg total) by mouth daily.   metFORMIN 1000 MG tablet Commonly known as: Glucophage Take 1 tablet (1,000 mg total) by mouth 2 (two) times daily with a meal.   metoprolol succinate 25 MG 24 hr tablet Commonly known as: TOPROL-XL Take 1 tablet (25 mg total) by mouth daily.   montelukast 10 MG tablet Commonly known as: SINGULAIR Take 1 tablet (10 mg total) by mouth at bedtime.   multivitamin with minerals Tabs tablet Take 1 tablet by mouth daily.   NovoLIN 70/30 (70-30) 100 UNIT/ML injection Generic drug: insulin NPH-regular Human Inject 50 Units into the skin 2 (two) times daily with a meal.   predniSONE 10 MG tablet Commonly known as: DELTASONE Prednisone 40 mg po daily x 1 day the, 30 mg po daily x 1 day then,  20 mg po daily x 1 day then Prednisone 10 mg daily x 1 day then stop... What changed:   medication strength  how much to take  how to take this  when to take this  additional instructions   thiamine 100 MG tablet Take 2.5 tablets (250 mg total) by mouth daily. What changed: how much to take   umeclidinium-vilanterol 62.5-25 MCG/INH Aepb Commonly known as: ANORO ELLIPTA Inhale 1 puff into the lungs daily.   Vitamin D (Ergocalciferol) 1.25 MG (50000 UNIT) Caps capsule Commonly known as: DRISDOL Take 1 capsule (50,000 Units total) by mouth every 7 (seven) days.      Allergies  Allergen Reactions  . Asa [Aspirin] Hives  . Zolmitriptan Nausea And Vomiting and Other (See Comments)    Severe headaches      The results of significant diagnostics from this hospitalization (including imaging, microbiology, ancillary and laboratory) are listed below for reference.    Significant Diagnostic Studies: DG Chest Port 1 View  Result Date: 12/20/2019 CLINICAL DATA:  Shortness of breath and fever. EXAM: PORTABLE CHEST 1 VIEW COMPARISON:  November 01, 2019 FINDINGS:  The heart size and mediastinal contours are within normal limits. Both lungs are clear. The visualized skeletal structures are unremarkable. IMPRESSION: No active disease. Electronically Signed   By: Aram Candela M.D.   On: 12/20/2019 16:30   DG Chest Port 1V same Day  Result Date: 12/20/2019 CLINICAL DATA:  Chest pain EXAM: PORTABLE CHEST 1 VIEW COMPARISON:  12/20/2019, 11/01/2019 FINDINGS: The heart size and mediastinal contours are within normal limits. Both lungs are clear. The visualized skeletal structures are unremarkable. IMPRESSION: No active disease. Electronically Signed   By: Jasmine Pang M.D.   On: 12/20/2019 21:31    Microbiology: Recent Results (from the past 240 hour(s))  Resp Panel by RT-PCR (Flu A&B, Covid) Nasopharyngeal Swab     Status: None   Collection Time: 12/20/19  2:54 PM   Specimen: Nasopharyngeal Swab; Nasopharyngeal(NP) swabs in vial transport medium  Result Value Ref Range Status   SARS Coronavirus 2 by RT PCR NEGATIVE  NEGATIVE Final    Comment: (NOTE) SARS-CoV-2 target nucleic acids are NOT DETECTED.  The SARS-CoV-2 RNA is generally detectable in upper respiratory specimens during the acute phase of infection. The lowest concentration of SARS-CoV-2 viral copies this assay can detect is 138 copies/mL. A negative result does not preclude SARS-Cov-2 infection and should not be used as the sole basis for treatment or other patient management decisions. A negative result may occur with  improper specimen collection/handling, submission of specimen other than nasopharyngeal swab, presence of viral mutation(s) within the areas targeted by this assay, and inadequate number of viral copies(<138 copies/mL). A negative result must be combined with clinical observations, patient history, and epidemiological information. The expected result is Negative.  Fact Sheet for Patients:  BloggerCourse.com  Fact Sheet for Healthcare Providers:   SeriousBroker.it  This test is no t yet approved or cleared by the [place] FDA and  has been authorized for detection and/or diagnosis of SARS-CoV-2 by FDA under an Emergency Use Authorization (EUA). This EUA will remain  in effect (meaning this test can be used) for the duration of the COVID-19 declaration under Section 564(b)(1) of the Act, 21 U.S.C.section 360bbb-3(b)(1), unless the authorization is terminated  or revoked sooner.       Influenza A by PCR NEGATIVE NEGATIVE Final   Influenza B by PCR NEGATIVE NEGATIVE Final    Comment: (NOTE) The Xpert Xpress SARS-CoV-2/FLU/RSV plus assay is intended as an aid in the diagnosis of influenza from Nasopharyngeal swab specimens and should not be used as a sole basis for treatment. Nasal washings and aspirates are unacceptable for Xpert Xpress SARS-CoV-2/FLU/RSV testing.  Fact Sheet for Patients: BloggerCourse.com  Fact Sheet for Healthcare Providers: SeriousBroker.it  This test is not yet approved or cleared by the [place] FDA and has been authorized for detection and/or diagnosis of SARS-CoV-2 by FDA under an Emergency Use Authorization (EUA). This EUA will remain in effect (meaning this test can be used) for the duration of the COVID-19 declaration under Section 564(b)(1) of the Act, 21 U.S.C. section 360bbb-3(b)(1), unless the authorization is terminated or revoked.  Performed at [facility], [address], [place], [place] [address]      Labs: Basic Metabolic Panel: Recent Labs  Lab 12/20/19 1600 12/20/19 2100 12/21/19 0441 12/22/19 0647 12/22/19 2331  NA 136  --  136 136  --   K 3.9  --  5.2* 4.3  --   CL 100  --  99 97*  --   CO2 25  --  25 27  --   GLUCOSE 105*  --  241* 258* 445*  BUN 10  --  16 23*  --   CREATININE 0.60*  --  0.63 0.69  --   CALCIUM 8.9  --  9.2 9.1  --   MG  --  1.9 2.2  --   --    Liver Function  Tests: Recent Labs  Lab 12/20/19 1600 12/21/19 0441  AST 15 17  ALT 19 20  ALKPHOS 53 55  BILITOT 0.2* 0.3  PROT 7.7 8.4*  ALBUMIN 4.3 4.5   No results for input(s): LIPASE, AMYLASE in the last 168 hours. No results for input(s): AMMONIA in the last 168 hours. CBC: Recent Labs  Lab 12/20/19 1600 12/21/19 0441 12/22/19 0647 12/24/19 0836  WBC 15.4* 17.5* 15.0* 12.8*  NEUTROABS 11.3* 15.0*  --   --   HGB 13.4 13.6 12.9* 13.9  HCT 41.0 42.4 40.1 43.2  MCV 86.7  87.4 88.1 86.4  PLT 444* 479* 449* 485*   Cardiac Enzymes: No results for input(s): CKTOTAL, CKMB, CKMBINDEX, TROPONINI in the last 168 hours. BNP: BNP (last 3 results) Recent Labs    03/28/19 1126 07/18/19 1140 12/21/19 0441  BNP 70.0 54.0 55.0    ProBNP (last 3 results) No results for input(s): PROBNP in the last 8760 hours.  CBG: Recent Labs  Lab 12/23/19 1125 12/23/19 1553 12/23/19 1757 12/23/19 2100 12/24/19 0724  GLUCAP 357* 317* 305* 343* 267*       Signed:  Meredeth IdeGagan S Alvey Brockel MD.  [facility] 12/24/2019, 10:57 AM

## 2020-03-28 ENCOUNTER — Telehealth: Payer: Self-pay | Admitting: *Deleted

## 2020-03-28 DIAGNOSIS — R918 Other nonspecific abnormal finding of lung field: Secondary | ICD-10-CM

## 2020-03-28 NOTE — Telephone Encounter (Signed)
Spoke with the pt and reminded him that CT Chest is due  He verbalized understanding  I have placed order for CT Chest

## 2020-03-28 NOTE — Telephone Encounter (Signed)
-----   Message from [name], MD sent at 08/24/2019  3:30 PM EDT ----- Ct s contrast f/u mpns due mid march

## 2020-04-09 ENCOUNTER — Ambulatory Visit (HOSPITAL_COMMUNITY)
Admission: RE | Admit: 2020-04-09 | Discharge: 2020-04-09 | Disposition: A | Discharge status: Home / Self Care | Payer: Medicaid Other | Source: Ambulatory Visit | Attending: Internal Medicine | Admitting: Internal Medicine

## 2020-04-09 ENCOUNTER — Other Ambulatory Visit: Payer: Self-pay

## 2020-04-09 DIAGNOSIS — R918 Other nonspecific abnormal finding of lung field: Secondary | ICD-10-CM | POA: Insufficient documentation

## 2020-04-12 ENCOUNTER — Encounter: Payer: Self-pay | Admitting: *Deleted

## 2020-04-12 ENCOUNTER — Telehealth: Payer: Self-pay | Admitting: Internal Medicine

## 2020-04-12 NOTE — Telephone Encounter (Signed)
I spoke with the pt  He states he understands that the CT was unchanged  He states that his PCP told him that the CT showed severe emphysema  He is asking what Dr Sherene Sires thinks "are my lungs healed now"?  Please advise thanks

## 2020-04-12 NOTE — Progress Notes (Signed)
Letter mailed to the pt. 

## 2020-04-12 NOTE — Telephone Encounter (Signed)
Attempted to call but his message machine answered and doesn't want to receive messages.  I reviewed the most recent ct which shows no significant emphysema nor was it suggested on pfts so if we've ruled that out but if still having symptoms happy to take another look now that we have all that info but nothing over the phone to consider in meantime.

## 2020-04-13 NOTE — Telephone Encounter (Signed)
Called and spoke with pt and he is aware of MW recs.  He is scheduled to see MW in [place] on 4/4 to discuss his [facility].

## 2020-04-30 ENCOUNTER — Encounter: Payer: Self-pay | Admitting: Internal Medicine

## 2020-04-30 ENCOUNTER — Ambulatory Visit (INDEPENDENT_AMBULATORY_CARE_PROVIDER_SITE_OTHER): Payer: Self-pay | Admitting: Internal Medicine

## 2020-04-30 ENCOUNTER — Other Ambulatory Visit: Payer: Self-pay

## 2020-04-30 DIAGNOSIS — J449 Chronic obstructive pulmonary disease, unspecified: Secondary | ICD-10-CM

## 2020-04-30 MED ORDER — AZITHROMYCIN 250 MG PO TABS: ORAL_TABLET | ORAL | 0 refills | Status: DC

## 2020-04-30 MED ORDER — BREZTRI AEROSPHERE 160-9-4.8 MCG/ACT IN AERO: 2.0000 | INHALATION_SPRAY | Freq: Two times a day (BID) | RESPIRATORY_TRACT | 0 refills | Status: DC

## 2020-04-30 NOTE — Patient Instructions (Signed)
Plan A = Automatic = Always=    Stop anoro and start breztri Take 2 puffs first thing in am and then another 2 puffs about 12 hours later.     Work on inhaler technique:  relax and gently blow all the way out then take a nice smooth deep breath back in, triggering the inhaler at same time you start breathing in.  Hold for up to 5 seconds if you can. Blow out thru nose. Rinse and gargle with water when done     Plan B = Backup (to supplement plan A, not to replace it) Only use your albuterol inhaler as a rescue medication to be used if you can't catch your breath by resting or doing a relaxed purse lip breathing pattern.  - The less you use it, the better it will work when you need it. - Ok to use the inhaler up to 2 puffs  every 4 hours if you must but call for appointment if use goes up over your usual need - Don't leave home without it !!  (think of it like the spare tire for your car)   Plan C = Crisis (instead of Plan B but only if Plan B stops working) - only use your albuterol nebulizer if you first try Plan B and it fails to help > ok to use the nebulizer up to every 4 hours but if start needing it regularly call for immediate appointment   zpak    Please schedule a follow up visit in 3 months but call sooner if needed

## 2020-04-30 NOTE — Progress Notes (Signed)
Robert Rich, male    DOB: November 07, 1965     MRN: [id number]   Brief patient profile:  55  yowm body shop owner  quit smoking 2019-04-07 at wt [employer]  progressive sob x 2011 and required neb x 2020 and then admitted with IHD/ sudden death 04-07-2019 and back to baseline  then admit with aecopd:    Admit date: 08/04/2019 Discharge date: 08/11/2019  Brief/Interim Summary: 54 y.o.maleformer smokerwith medical history significantforsevere COPD, coronary artery disease, type 2 diabetes mellitus, dyslipidemia, alcohol use disorder and substance abuse with benzodiazepines and history of prior cocaine use was recently hospitalized discharged 2 weeks ago for a COPD exacerbation. He reports that he felt well after going home but when he ran out of steroids and antibiotics his symptoms slowly began to return over the course of the last 3 days. He became so severely short of breath today that he called EMS. He says that he had return to work last week and was working short hours up until 3 days ago when he had increased his work hours and noticed that his shortness of breath became acutely worse. He reports significant wheezing and increased work of breathing coughing and chest discomfort. He denies chest pain. He reports productive cough with yellow sputum. He can only walk a few feet without severe shortness of breath.  Discharge Diagnoses:   Acute on chronic respiratory failure with hypoxia  -secondary to severe COPD exacerbation --symptoms rapidly returned after he completed his steroid taper and antibiotics from recent hospitalization. -He has an outpatient pulmonary appt later this month but I requested inpatient consult given multiple admissions and ED visits. -appreciate pulmonary follow up-->wean prednisone over 2 weeks  COPDExacerbation -continue duonebs -de-escalate to po prednisone - Appreciate pulmonary consult. Discussed with Dr. Halina Maidens prednisone over next 2 weeks   -Unfortunately patient could not tolerate brovana neb treatments. -Continue Duonebs, continue pulmicort nebs -Pulmonary willl see him again on outpatient follow up 08/24/19. -They will arrange PFTs. -He was given lasix 20 mg IV daily x 4total doses during the hospitalization.   CAD  - stable, no chest pain symptoms, resumed his home heart medications.   Tobacco abuse - He reports he quit several months ago.  Polysubstance abuse  - UDS positive for benzo, opioid and barbituates.  Anxiety -alprazolam as needed only for severe symptoms.  Type 2 DM with steroid induced hyperglycemia -increased doses of insulin and follow closely especially while on IV steroids.  -Pt will need to DC home on insulin. He has used insulin before. He prefers vials and syringes. He will be on steroids for at least 2 weeks after discharge and will need insulin plus metformin to help control his steroid induced hyperglycemia -d/c home with novolin 70/30--50 units bid -instructed pt to continue checking CBGs ac/hs      overall 50% improved at d/c on prednisone tapered off 08/20/19 and about the same as at d/c but no longer using neb / maint on Anoro      History of Present Illness  08/24/2019  Pulmonary/ 1st office eval/ Robert Rich / [facility] Office  Chief Complaint  Patient presents with  . Follow-up    shortness of breath with exertion  Dyspnea:  MMRC3 = can't walk 100 yards even at a slow pace at a flat grade s stopping due to sob   Cough: resolved  Sleep: flat  SABA use: combivent and anoro both in am then no other saba  rec Plan A = Automatic =  Always=   Anoro one click take two drags Plan B = Backup (to supplement plan A, not to replace it) Only use your albuterol inhaler as a rescue medication Try albuterol 15 min before an activity that you know would make you short of breath     Plan C = Crisis (instead of Plan B but only if Plan B stops working) - only use your albuterol  nebulizer if you first try Plan B  Make sure you check your oxygen saturations at highest level of activity  Please schedule a follow up visit in 3 months with PFTs  but call sooner if needed  - ok to delay if you haven't got your insurance.    11/29/2019  f/u ov/Robert Rich re: GOLD 0 on anoro with resolution of cough and doe improved Chief Complaint  Patient presents with  . Follow-up    NO complaints  Dyspnea:  MMRC1 = can walk nl pace, flat grade, can't hurry or go uphills or steps s sob   Cough: none  Sleeping: able to lie down flat 2 pillows SABA use: none at all  02: none  rec No change in recommendations  To get the most out of exercise, you need to be continuously aware that you are short of breath   04/30/2020  f/u ov/Robert Rich office/Robert Rich re: GOLD 0 copd/AB Chief Complaint  Patient presents with  . Follow-up    Breathing has not improved since his last visit. He has had prod cough with brown sputum for the past 5 wks. He is using his albuterol inhaler 7 x per on average and rarely uses neb.   Dyspnea: MMRC2 = can't walk a nl pace on a flat grade s sob but does fine slow and flat eg Cough: brown esp in am x 5 weeks Sleeping: poorly due to cough SABA use: way too much  02: none Covid status: x 2 last 09/26/19  Lung cancer screening: won't be due until 03/2021   No obvious day to day or daytime variability or assoc  mucus plugs or hemoptysis or cp or chest tightness, subjective wheeze or overt sinus or hb symptoms.    . Also denies any obvious fluctuation of symptoms with weather or environmental changes or other aggravating or alleviating factors except as outlined above   No unusual exposure hx or h/o childhood pna/ asthma or knowledge of premature birth.  Current Allergies, Complete Past Medical History, Past Surgical History, Family History, and Social History were reviewed in [employer] record.  ROS  The following are not active complaints unless  bolded Hoarseness, sore throat, dysphagia, dental problems, itching, sneezing,  nasal congestion or discharge of excess mucus or purulent secretions, ear ache,   fever, chills, sweats, unintended wt loss or wt gain, classically pleuritic or exertional cp,  orthopnea pnd or arm/hand swelling  or leg swelling, presyncope, palpitations, abdominal pain, anorexia, nausea, vomiting, diarrhea  or change in bowel habits or change in bladder habits, change in stools or change in urine, dysuria, hematuria,  rash, arthralgias, visual complaints, headache, numbness, weakness or ataxia or problems with walking or coordination,  change in mood or  memory.        Current Meds  Medication Sig  . albuterol (PROVENTIL) (2.5 MG/3ML) 0.083% nebulizer solution Take 3 mLs (2.5 mg total) by nebulization every 4 (four) hours as needed for wheezing or shortness of breath.  Marland Kitchen atorvastatin (LIPITOR) 40 MG tablet Take 1 tablet (40 mg total) by mouth daily  at 6 PM.  . butalbital-acetaminophen-caffeine (FIORICET) 50-325-40 MG tablet Take 1 tablet by mouth every 6 (six) hours as needed for migraine.  . clopidogrel (PLAVIX) 75 MG tablet Take 1 tablet (75 mg total) by mouth daily.  . folic acid (FOLVITE) 1 MG tablet Take 1 tablet (1 mg total) by mouth daily.  . insulin NPH-regular Human (NOVOLIN 70/30) (70-30) 100 UNIT/ML injection Inject 50 Units into the skin 2 (two) times daily with a meal.  . ipratropium-albuterol (DUONEB) 0.5-2.5 (3) MG/3ML SOLN Inhale 3 mLs into the lungs 4 (four) times daily as needed.  . metFORMIN (GLUCOPHAGE) 1000 MG tablet Take 1 tablet (1,000 mg total) by mouth 2 (two) times daily with a meal.  . metoprolol succinate (TOPROL-XL) 25 MG 24 hr tablet Take 1 tablet (25 mg total) by mouth daily.  . montelukast (SINGULAIR) 10 MG tablet Take 1 tablet (10 mg total) by mouth at bedtime.  . Multiple Vitamin (MULTIVITAMIN WITH MINERALS) TABS tablet Take 1 tablet by mouth daily.  Marland Kitchen thiamine 100 MG tablet Take 2.5  tablets (250 mg total) by mouth daily. (Patient taking differently: Take 100 mg by mouth daily.)  . umeclidinium-vilanterol (ANORO ELLIPTA) 62.5-25 MCG/INH AEPB Inhale 1 puff into the lungs daily.                   Past Medical History:  Diagnosis Date  . COPD (chronic obstructive pulmonary disease) (HCC)   . Coronary artery disease   . DM (diabetes mellitus) (HCC)   . Heart attack (HCC)   . HLD (hyperlipidemia)   . HTN (hypertension)   . Obesity   . Stroke [facility])          Objective:        04/30/2020          240   11/29/19 248 lb 6.4 oz (112.7 kg)  11/01/19 250 lb (113.4 kg)  10/25/19 250 lb (113.4 kg)     Vital signs reviewed  04/30/2020  - Note at rest 02 sats  98% on RA   General appearance:    Pleasant amb wm / congested cough     HEENT : pt wearing mask not removed for exam due to covid - 19 concerns.   NECK :  without JVD/Nodes/TM/ nl carotid upstrokes bilaterally   LUNGS: no acc muscle use,  Min barrel  contour chest wall with bilateral   Insp/ exp rhonchi and  without cough on insp or exp maneuvers and min  Hyperresonant  to  percussion bilaterally     CV:  RRR  no s3 or murmur or increase in P2, and no edema   ABD:  soft and nontender with pos end  insp Hoover's  in the supine position. No bruits or organomegaly appreciated, bowel sounds nl  MS:   Nl gait/  ext warm without deformities, calf tenderness, cyanosis or clubbing No obvious joint restrictions   SKIN: warm and dry without lesions    NEURO:  alert, approp, nl sensorium with  no motor or cerebellar deficits apparent.            I personally reviewed images and agree with radiology impression as follows:   Chest CT w/o contrast 04/09/20  Similar scattered tiny 2-4 mm pulmonary nodules bilaterally, which again are nonspecific but favor benign. No larger more suspicious appearing pulmonary nodules or masses.    Assessment

## 2020-05-01 ENCOUNTER — Encounter: Payer: Self-pay | Admitting: Internal Medicine

## 2020-05-01 NOTE — Assessment & Plan Note (Addendum)
Quit smoking 02/2019 -  Spirometry only on 11/01/19  No obst p anoro prior to study and minimal concavity to f/v loop  - 04/30/2020  After extensive coaching inhaler device,  effectiveness =    75% > try breztri 2bid   Subacute exac typical for  Group D in terms of symptom/risk and laba/lama/ICS  therefore appropriate rx at this point >>> change to   breztri and approp saba  I spent extra time with pt today reviewing appropriate use of albuterol for prn use on exertion with the following points: 1) saba is for relief of sob that does not improve by walking a slower pace or resting but rather if the pt does not improve after trying this first. 2) If the pt is convinced, as many are, that saba helps recover from activity faster then it's easy to tell if this is the case by re-challenging : ie stop, take the inhaler, then p 5 minutes try the exact same activity (intensity of workload) that just caused the symptoms and see if they are substantially diminished or not after saba 3) if there is an activity that reproducibly causes the symptoms, try the saba 15 min before the activity on alternate days   If in fact the saba really does help, then fine to continue to use it prn but advised may need to look closer at the maintenance regimen being used to achieve better control of airways disease with exertion.   rx acute flare bronchitis  with zpak         Each maintenance medication was reviewed in detail including emphasizing most importantly the difference between maintenance and prns and under what circumstances the prns are to be triggered using an action plan format where appropriate.  Total time for H and P, chart review, counseling, reviewing hfa device(s) and generating customized AVS unique to this office visit / same day charting = 28 min

## 2020-06-04 ENCOUNTER — Ambulatory Visit: Payer: Self-pay | Admitting: *Deleted

## 2020-06-04 NOTE — Telephone Encounter (Signed)
Patient calling sounding almost panicky. Thinks he has Covid C/O difficulty breathing, CP, dizziness, HA and sneezing. Reports he has COPD. Advised ED for evaluation at this time. Instructed to wear a mask at all times until told not to. Communicate with EMS that you suspect Covid may be causing these symptoms. Patient voiced understanding instructions.  Reason for Disposition . Severe difficulty breathing (e.g., struggling for each breath, speaks in single words)  Answer Assessment - Initial Assessment Questions 1. RESPIRATORY STATUS: "Describe your breathing?" (e.g., wheezing, shortness of breath, unable to speak, severe coughing)      SOB, cough, chest tightness, sneezing 2. ONSET: "When did this breathing problem begin?"      today 3. PATTERN "Does the difficult breathing come and go, or has it been constant since it started?"      constant 4. SEVERITY: "How bad is your breathing?" (e.g., mild, moderate, severe)    - MILD: No SOB at rest, mild SOB with walking, speaks normally in sentences, can lie down, no retractions, pulse < 100.    - MODERATE: SOB at rest, SOB with minimal exertion and prefers to sit, cannot lie down flat, speaks in phrases, mild retractions, audible wheezing, pulse 100-120.    - SEVERE: Very SOB at rest, speaks in single words, struggling to breathe, sitting hunched forward, retractions, pulse > 120      He sounds very frightful, did not sound like he was struggling to breathe. He did sound like he was struggling getting words out. 5. RECURRENT SYMPTOM: "Have you had difficulty breathing before?" If Yes, ask: "When was the last time?" and "What happened that time?"      Yes, COPD 6. CARDIAC HISTORY: "Do you have any history of heart disease?" (e.g., heart attack, angina, bypass surgery, angioplasty)  7. LUNG HISTORY: "Do you have any history of lung disease?"  (e.g., pulmonary embolus, asthma, emphysema)     COPD 8. CAUSE: "What do you think is causing the breathing  problem?"      Thinks he has covid 9. OTHER SYMPTOMS: "Do you have any other symptoms? (e.g., dizziness, runny nose, cough, chest pain, fever)     Cough, sneezing, chest pain, dizziness 10. O2 SATURATION MONITOR:  "Do you use an oxygen saturation monitor (pulse oximeter) at home?" If Yes, "What is your reading (oxygen level) today?" "What is your usual oxygen saturation reading?" (e.g., 95%)       na 11. PREGNANCY: "Is there any chance you are pregnant?" "When was your last menstrual period?"       na 12. TRAVEL: "Have you traveled out of the country in the last month?" (e.g., travel history, exposures)       Did not have opportunity to ask. Patient was upset, sounded as though he were crying.  Protocols used: CORONAVIRUS (COVID-19) DIAGNOSED OR SUSPECTED-A-AH, BREATHING DIFFICULTY-A-AH

## 2020-06-13 IMAGING — DX DG CHEST 2V
3 series · 3 of 3 positions shown · non-contrast
Comparison: 04/01/2006

CLINICAL DATA: Cough and fever, body aches, diarrhea, and shortness
of breath, symptoms for 3 days, feels like he "cannot get enough
air", smoker, history coronary artery disease post MI, stroke

EXAM:
CHEST - 2 VIEW

[chest pa (1 of 2)]
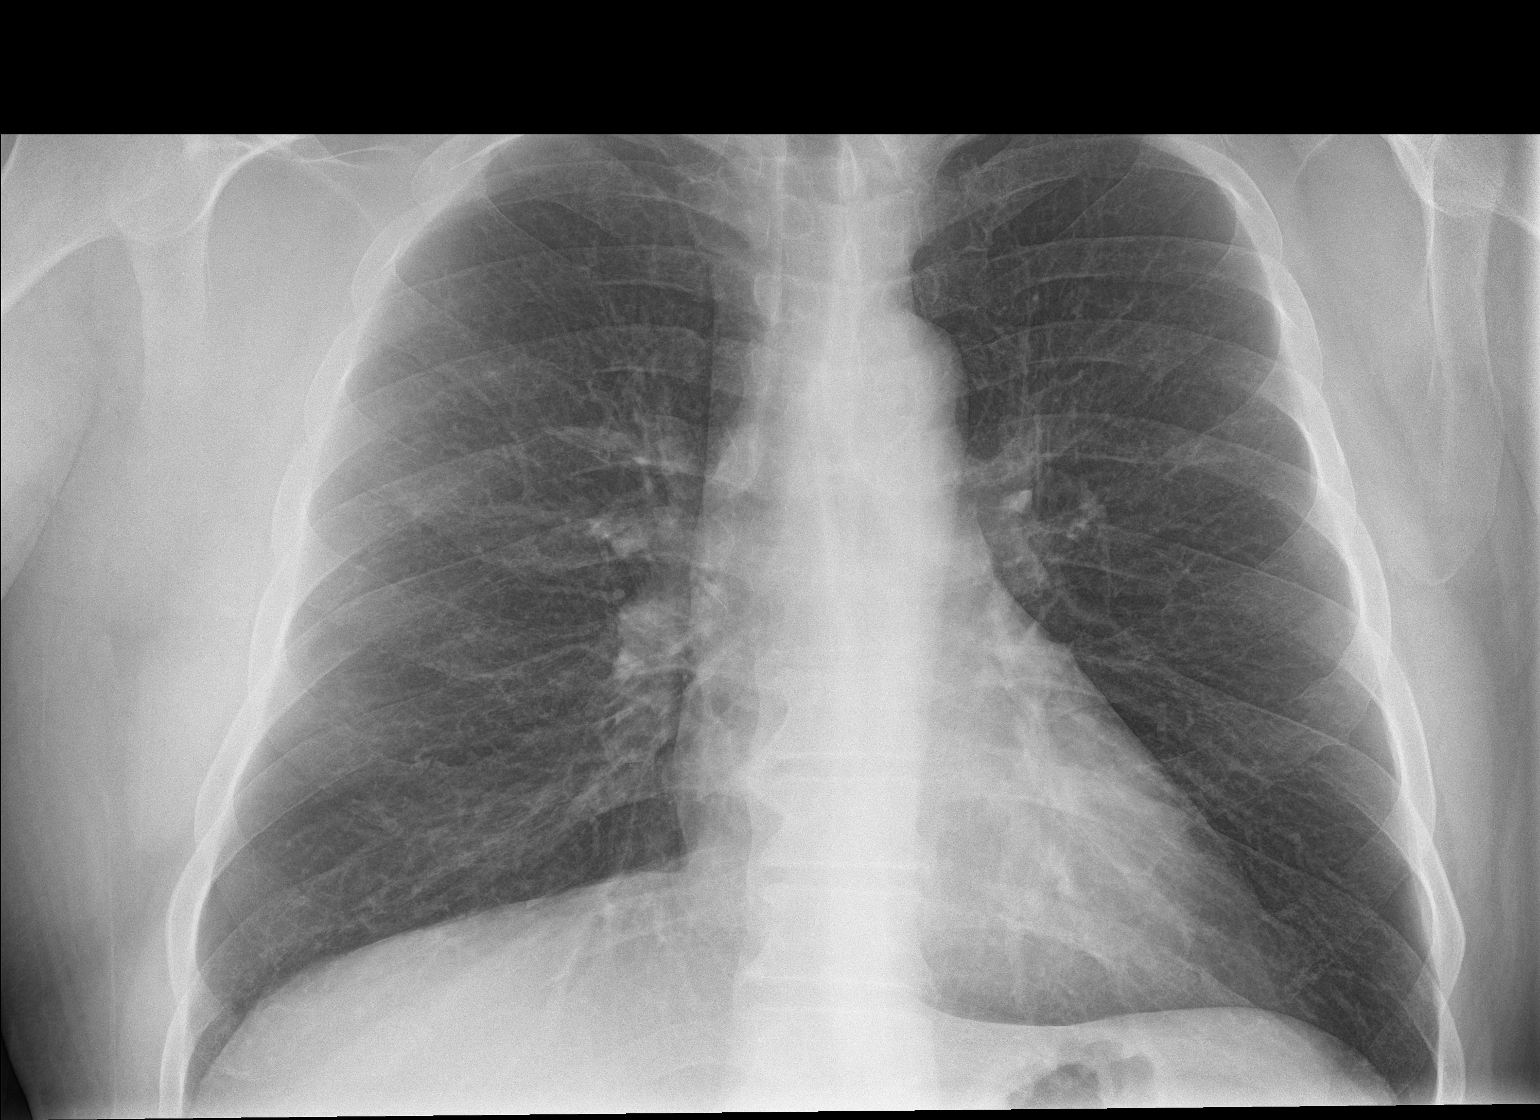

[chest lat]
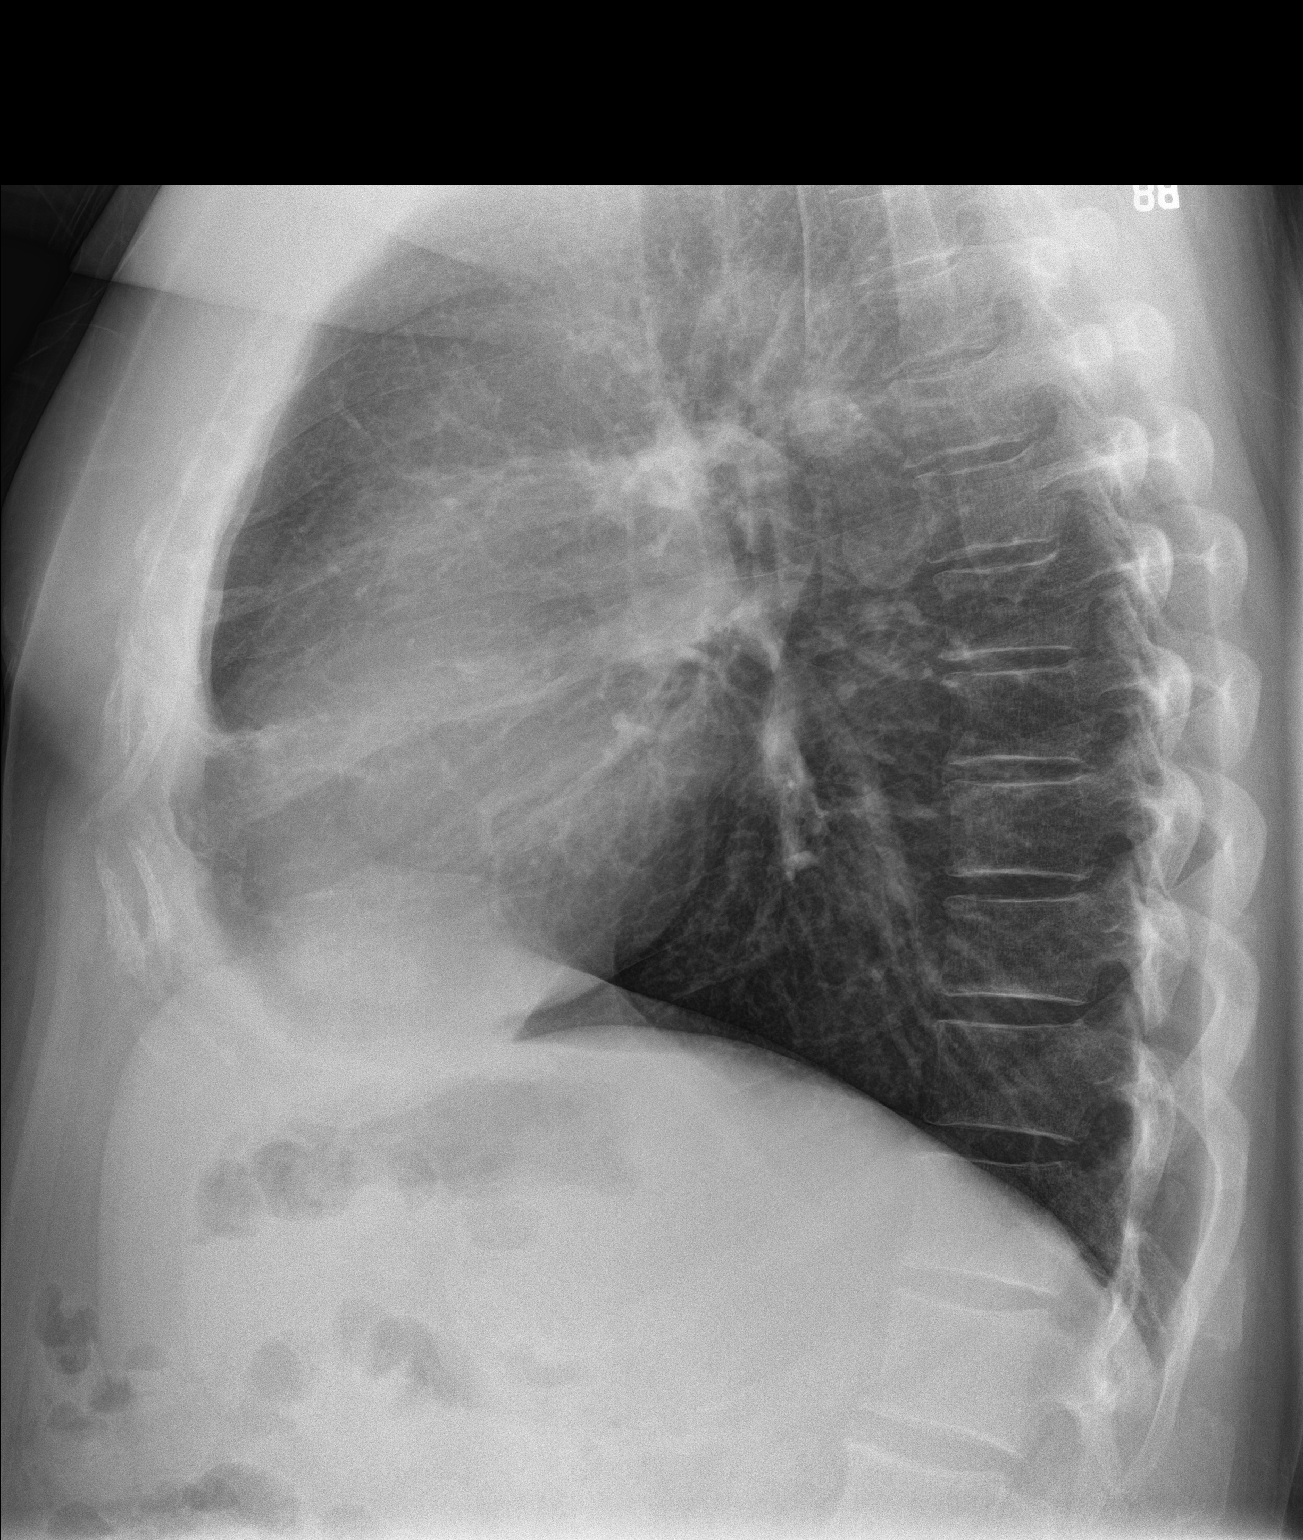

[chest pa (2 of 2)]
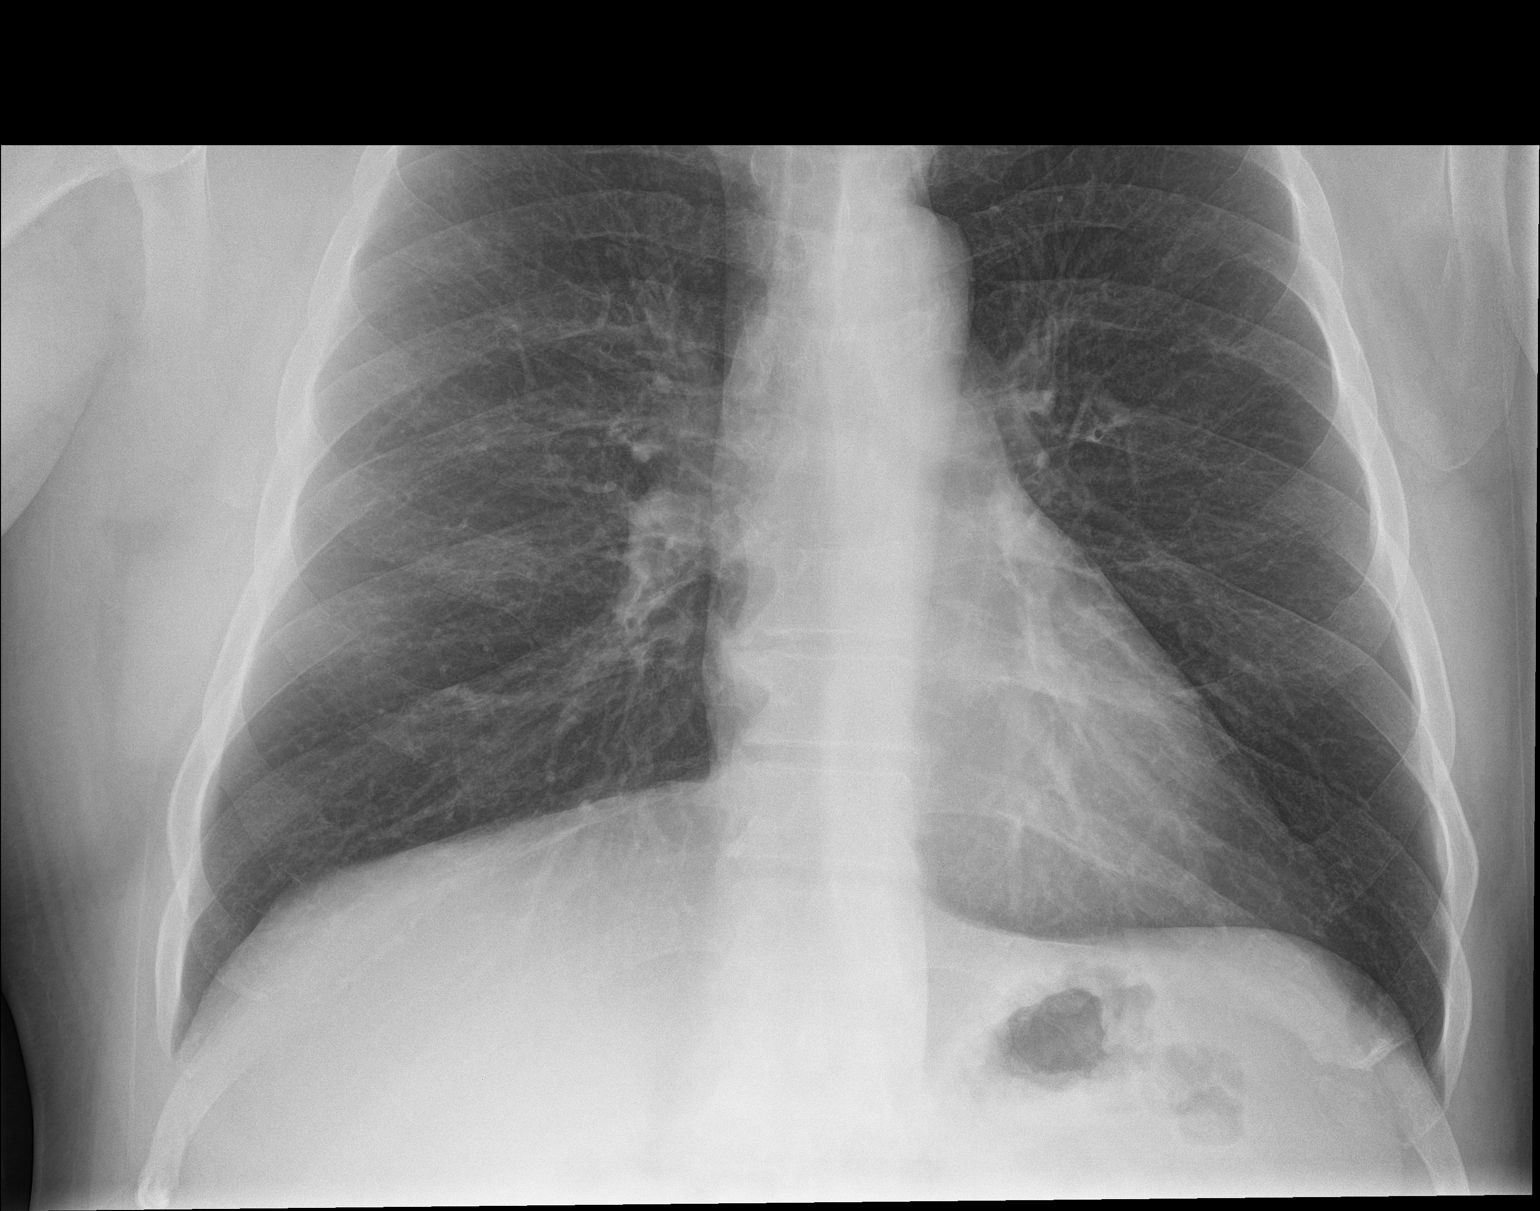

[3 of 3 positions shown; findings below may reference images not displayed]

FINDINGS: Normal heart size, mediastinal contours, and pulmonary vascularity.

Lungs clear.

No pleural effusion or pneumothorax.

Bones unremarkable.
IMPRESSION: No acute abnormalities.

## 2020-06-27 ENCOUNTER — Ambulatory Visit: Payer: Medicaid Other | Admitting: Internal Medicine

## 2020-06-27 ENCOUNTER — Encounter: Payer: Self-pay | Admitting: Internal Medicine

## 2020-06-27 ENCOUNTER — Other Ambulatory Visit: Payer: Self-pay

## 2020-06-27 DIAGNOSIS — J449 Chronic obstructive pulmonary disease, unspecified: Secondary | ICD-10-CM

## 2020-06-27 MED ORDER — BUDESONIDE-FORMOTEROL FUMARATE 160-4.5 MCG/ACT IN AERO: INHALATION_SPRAY | RESPIRATORY_TRACT | 12 refills | Status: DC

## 2020-06-27 NOTE — Patient Instructions (Addendum)
Plan A = Automatic = Always=    breztri one twice daily  (symbicort 160 ) one puff first thing in am and 12 hours later  Work on inhaler technique:  relax and gently blow all the way out then take a nice smooth full deep breath back in, triggering the inhaler at same time you start breathing in.  Hold for up to 5 seconds if you can. Blow out thru nose. Rinse and gargle with water when done.  If mouth or throat bother you at all,  try brushing teeth/gums/tongue with arm and hammer toothpaste/ make a slurry and gargle and spit out.    Plan B = Backup (to supplement plan A, not to replace it) Only use your albuterol inhaler as a rescue medication to be used if you can't catch your breath by resting or doing a relaxed purse lip breathing pattern.  - The less you use it, the better it will work when you need it. - Ok to use the inhaler up to 1 puffs  every 4 hours if you must but call for appointment if use goes up over your usual need - Don't leave home without it !!  (think of it like the spare tire for your car)   Plan C = Crisis (instead of Plan B but only if Plan B stops working) - only use your albuterol nebulizer (one half vile)  if you first try Plan B and it fails to help > ok to use the nebulizer up to every 4 hours but if start needing it regularly call for immediate appointment   Please schedule a follow up office visit in 6 weeks, call sooner if needed - bring inhlaers with you

## 2020-06-27 NOTE — Progress Notes (Signed)
Robert Rich, male    DOB: November 07, 1965     MRN: [id number]   Brief patient profile:  55  yowm body shop owner  quit smoking 2019-04-07 at wt 200 assoc  progressive sob x 2011 and required neb x 2020 and then admitted with IHD/ sudden death 04-07-2019 and back to baseline  then admit with aecopd:    Admit date: 08/04/2019 Discharge date: 08/11/2019  Brief/Interim Summary: 54 y.o.maleformer smokerwith medical history significantforsevere COPD, coronary artery disease, type 2 diabetes mellitus, dyslipidemia, alcohol use disorder and substance abuse with benzodiazepines and history of prior cocaine use was recently hospitalized discharged 2 weeks ago for a COPD exacerbation. He reports that he felt well after going home but when he ran out of steroids and antibiotics his symptoms slowly began to return over the course of the last 3 days. He became so severely short of breath today that he called EMS. He says that he had return to work last week and was working short hours up until 3 days ago when he had increased his work hours and noticed that his shortness of breath became acutely worse. He reports significant wheezing and increased work of breathing coughing and chest discomfort. He denies chest pain. He reports productive cough with yellow sputum. He can only walk a few feet without severe shortness of breath.  Discharge Diagnoses:   Acute on chronic respiratory failure with hypoxia  -secondary to severe COPD exacerbation --symptoms rapidly returned after he completed his steroid taper and antibiotics from recent hospitalization. -He has an outpatient pulmonary appt later this month but I requested inpatient consult given multiple admissions and ED visits. -appreciate pulmonary follow up-->wean prednisone over 2 weeks  COPDExacerbation -continue duonebs -de-escalate to po prednisone - Appreciate pulmonary consult. Discussed with Dr. Halina Maidens prednisone over next 2 weeks   -Unfortunately patient could not tolerate brovana neb treatments. -Continue Duonebs, continue pulmicort nebs -Pulmonary willl see him again on outpatient follow up 08/24/19. -They will arrange PFTs. -He was given lasix 20 mg IV daily x 4total doses during the hospitalization.   CAD  - stable, no chest pain symptoms, resumed his home heart medications.   Tobacco abuse - He reports he quit several months ago.  Polysubstance abuse  - UDS positive for benzo, opioid and barbituates.  Anxiety -alprazolam as needed only for severe symptoms.  Type 2 DM with steroid induced hyperglycemia -increased doses of insulin and follow closely especially while on IV steroids.  -Pt will need to DC home on insulin. He has used insulin before. He prefers vials and syringes. He will be on steroids for at least 2 weeks after discharge and will need insulin plus metformin to help control his steroid induced hyperglycemia -d/c home with novolin 70/30--50 units bid -instructed pt to continue checking CBGs ac/hs      overall 50% improved at d/c on prednisone tapered off 08/20/19 and about the same as at d/c but no longer using neb / maint on Anoro      History of Present Illness  08/24/2019  Pulmonary/ 1st office eval/ [name] / [facility]  Chief Complaint  Patient presents with  . Follow-up    shortness of breath with exertion  Dyspnea:  MMRC3 = can't walk 100 yards even at a slow pace at a flat grade s stopping due to sob   Cough: resolved  Sleep: flat  SABA use: combivent and anoro both in am then no other saba  rec Plan A = Automatic =  Always=   Anoro one click take two drags Plan B = Backup (to supplement plan A, not to replace it) Only use your albuterol inhaler as a rescue medication Try albuterol 15 min before an activity that you know would make you short of breath     Plan C = Crisis (instead of Plan B but only if Plan B stops working) - only use your albuterol  nebulizer if you first try Plan B  Make sure you check your oxygen saturations at highest level of activity  Please schedule a follow up visit in 3 months with PFTs  but call sooner if needed  - ok to delay if you haven't got your insurance.    11/29/2019  f/u ov/[name] re: GOLD 0 on anoro with resolution of cough and doe improved Chief Complaint  Patient presents with  . Follow-up    NO complaints  Dyspnea:  MMRC1 = can walk nl pace, flat grade, can't hurry or go uphills or steps s sob   Cough: none  Sleeping: able to lie down flat 2 pillows SABA use: none at all  02: none  rec No change in recommendations  To get the most out of exercise, you need to be continuously aware that you are short of breath   04/30/2020  f/u ov/[place] office/[name] re: GOLD 0 copd/AB Chief Complaint  Patient presents with  . Follow-up    Breathing has not improved since his last visit. He has had prod cough with brown sputum for the past 5 wks. He is using his albuterol inhaler 7 x per on average and rarely uses neb.   Dyspnea: MMRC2 = can't walk a nl pace on a flat grade s sob but does fine slow and flat eg Cough: brown esp in am x 5 weeks Sleeping: poorly due to cough SABA use: way too much  02: none Covid status: x 2 last 09/26/19  Lung cancer screening: won't be due until 03/2021 rec Plan A = Automatic = Always=    Stop anoro and start breztri Take 2 puffs first thing in am and then another 2 puffs about 12 hours later.  Work on inhaler technique:  Plan B = Backup (to supplement plan A, not to replace it) Only use your albuterol inhaler as a rescue medication  Plan C = Crisis (instead of Plan B but only if Plan B stops working) - only use your albuterol nebulizer if you first try Plan B  zpak      06/27/2020  f/u ov/[place] office/[name] re: back on anoro  Chief Complaint  Patient presents with  . Follow-up    PNA approx 3 wks ago and was hospitalized at [facility]. His breathing is  doing better overall, still has trouble when out in the heat.He has a prod cough with tan sputum. He uses his albuterol inhaler about 2 x per wk, rarely uses his neb.   Dyspnea:  No problem walking indoors/ does 20 minutes of squats / always better p prednisone  Cough: light gray in am  Sleeping: able to lie flat  SABA use: makes him jittery  02: none  Covid status: vax x 2 needs booster     No obvious patterns in  day to day or daytime variability or assoc  mucus plugs or hemoptysis or cp or chest tightness, subjective wheeze or overt sinus or hb symptoms.   Sleeping  without nocturnal  or early am exacerbation  of respiratory  c/o's or need  for noct saba. Also denies any obvious fluctuation of symptoms with weather or environmental changes or other aggravating or alleviating factors except as outlined above   No unusual exposure hx or h/o childhood pna/ asthma or knowledge of premature birth.  Current Allergies, Complete Past Medical History, Past Surgical History, Family History, and Social History were reviewed in [employer] record.  ROS  The following are not active complaints unless bolded Hoarseness, sore throat, dysphagia, dental problems, itching, sneezing,  nasal congestion or discharge of excess mucus or purulent secretions, ear ache,   fever, chills, sweats, unintended wt loss or wt gain, classically pleuritic or exertional cp,  orthopnea pnd or arm/hand swelling  or leg swelling, presyncope, palpitations, abdominal pain, anorexia, nausea, vomiting, diarrhea  or change in bowel habits or change in bladder habits, change in stools or change in urine, dysuria, hematuria,  rash, arthralgias, visual complaints, headache, numbness, weakness or ataxia or problems with walking or coordination,  change in mood or  memory.        Current Meds  Medication Sig  . albuterol (PROVENTIL) (2.5 MG/3ML) 0.083% nebulizer solution Take 3 mLs (2.5 mg total) by nebulization  every 4 (four) hours as needed for wheezing or shortness of breath.  . ALBUTEROL IN Inhale 2 puffs into the lungs every 4 (four) hours as needed.  Marland Kitchen atorvastatin (LIPITOR) 40 MG tablet Take 1 tablet (40 mg total) by mouth daily at 6 PM.  . butalbital-acetaminophen-caffeine (FIORICET) 50-325-40 MG tablet Take 1 tablet by mouth every 6 (six) hours as needed for migraine.  . clopidogrel (PLAVIX) 75 MG tablet Take 1 tablet (75 mg total) by mouth daily.  . folic acid (FOLVITE) 1 MG tablet Take 1 tablet (1 mg total) by mouth daily.  . insulin NPH-regular Human (NOVOLIN 70/30) (70-30) 100 UNIT/ML injection Inject 50 Units into the skin 2 (two) times daily with a meal.  . metFORMIN (GLUCOPHAGE) 1000 MG tablet Take 1 tablet (1,000 mg total) by mouth 2 (two) times daily with a meal.  . metoprolol succinate (TOPROL-XL) 25 MG 24 hr tablet Take 1 tablet (25 mg total) by mouth daily.  . montelukast (SINGULAIR) 10 MG tablet Take 1 tablet (10 mg total) by mouth at bedtime.  . Multiple Vitamin (MULTIVITAMIN WITH MINERALS) TABS tablet Take 1 tablet by mouth daily.  Marland Kitchen thiamine 100 MG tablet Take 2.5 tablets (250 mg total) by mouth daily. (Patient taking differently: Take 100 mg by mouth daily.)  . umeclidinium-vilanterol (ANORO ELLIPTA) 62.5-25 MCG/INH AEPB Inhale 1 puff into the lungs daily.                         Past Medical History:  Diagnosis Date  . COPD (chronic obstructive pulmonary disease) (HCC)   . Coronary artery disease   . DM (diabetes mellitus) (HCC)   . Heart attack (HCC)   . HLD (hyperlipidemia)   . HTN (hypertension)   . Obesity   . Stroke [facility])          Objective:       06/27/2020          232  04/30/2020          240   11/29/19 248 lb 6.4 oz (112.7 kg)  11/01/19 250 lb (113.4 kg)  10/25/19 250 lb (113.4 kg)      Vital signs reviewed  06/27/2020  - Note at rest 02 sats  98% on RA   General appearance:  amb obese wm nad    HEENT : pt wearing mask not removed for  exam due to covid - 19 concerns.   NECK :  without JVD/Nodes/TM/ nl carotid upstrokes bilaterally   LUNGS: no acc muscle use,  Min barrel  contour chest wall with bilateral  slightly decreased bs s audible wheeze and  without cough on insp or exp maneuvers and min  Hyperresonant  to  percussion bilaterally     CV:  RRR  no s3 or murmur or increase in P2, and no edema   ABD:  soft and nontender with pos end  insp Hoover's  in the supine position. No bruits or organomegaly appreciated, bowel sounds nl  MS:   Nl gait/  ext warm without deformities, calf tenderness, cyanosis or clubbing No obvious joint restrictions   SKIN: warm and dry without lesions    NEURO:  alert, approp, nl sensorium with  no motor or cerebellar deficits apparent.                  Assessment

## 2020-06-28 ENCOUNTER — Encounter: Payer: Self-pay | Admitting: Internal Medicine

## 2020-06-28 NOTE — Assessment & Plan Note (Signed)
Quit smoking 02/2019 at wt 260  -  Spirometry only on 11/01/19  No obst p anoro prior to study and minimal concavity to f/v loop  - 04/30/2020  After extensive coaching inhaler device,  effectiveness =    75% > try breztri 2bid > "too jittery" but flared on anoro  - 06/27/2020  After extensive coaching inhaler device,  effectiveness =    75% > rechallenge with breztri one bid   Clearly steroid responsive typical of group D in AB pattern so breztri or symbicort would be better than anoro here and just need to find the right dose for him and elminate excessive sympathomimetic effects by minimizing saba use   Re saba: I spent extra time with pt today reviewing appropriate use of albuterol for prn use on exertion with the following points: 1) saba is for relief of sob that does not improve by walking a slower pace or resting but rather if the pt does not improve after trying this first. 2) If the pt is convinced, as many are, that saba helps recover from activity faster then it's easy to tell if this is the case by re-challenging : ie stop, take the inhaler, then p 5 minutes try the exact same activity (intensity of workload) that just caused the symptoms and see if they are substantially diminished or not after saba 3) if there is an activity that reproducibly causes the symptoms, try the saba 15 min before the activity on alternate days   If in fact the saba really does help, then fine to continue to use it prn but advised may need to look closer at the maintenance regimen being used to achieve better control of airways disease with exertion.          Each maintenance medication was reviewed in detail including emphasizing most importantly the difference between maintenance and prns and under what circumstances the prns are to be triggered using an action plan format where appropriate.  Total time for H and P, chart review, counseling, reviewing hfa  device(s) and generating customized AVS unique to this  office visit / same day charting = 23 min

## 2020-08-08 ENCOUNTER — Encounter: Payer: Self-pay | Admitting: Internal Medicine

## 2020-08-08 ENCOUNTER — Other Ambulatory Visit: Payer: Self-pay

## 2020-08-08 ENCOUNTER — Ambulatory Visit (INDEPENDENT_AMBULATORY_CARE_PROVIDER_SITE_OTHER): Payer: Medicaid Other | Admitting: Internal Medicine

## 2020-08-08 DIAGNOSIS — J449 Chronic obstructive pulmonary disease, unspecified: Secondary | ICD-10-CM

## 2020-08-08 DIAGNOSIS — R079 Chest pain, unspecified: Secondary | ICD-10-CM

## 2020-08-08 NOTE — Progress Notes (Signed)
Robert Rich, male    DOB: [birth year]     MRN: [id number]   Brief patient profile:  55  yowm body shop owner  quit smoking 2019-03-26 at wt 200 assoc  progressive sob x 2011 and required neb x 2020 and then admitted with IHD/ sudden death Mar 26, 2019 and back to baseline  then admit with aecopd:     Admit date: 08/04/2019 Discharge date: 08/11/2019   Brief/Interim Summary: 55 y.o. male former smoker with medical history significant for severe COPD, coronary artery disease, type 2 diabetes mellitus, dyslipidemia, alcohol use disorder and substance abuse with benzodiazepines and history of prior cocaine use was recently hospitalized discharged 2 weeks ago for a COPD exacerbation.  He reports that he felt well after going home but when he ran out of steroids and antibiotics his symptoms slowly began to return over the course of the last 3 days.  He became so severely short of breath today that he called EMS.  He says that he had return to work last week and was working short hours up until 3 days ago when he had increased his work hours and noticed that his shortness of breath became acutely worse.  He reports significant wheezing and increased work of breathing coughing and chest discomfort.  He denies chest pain.  He reports productive cough with yellow sputum.  He can only walk a few feet without severe shortness of breath.   Discharge Diagnoses:    Acute on chronic respiratory failure with hypoxia  -secondary to severe COPD exacerbation  --symptoms rapidly returned after he completed his steroid taper and antibiotics from recent hospitalization. -He has an outpatient pulmonary appt later this month but I requested inpatient consult given multiple admissions and ED visits.  -appreciate pulmonary follow up-->wean prednisone over 2 weeks   COPD Exacerbation -continue duonebs -de-escalate to po prednisone  - Appreciate pulmonary consult.  Discussed with Dr. Halina Maidens prednisone over next 2 weeks    -Unfortunately patient could not tolerate brovana neb treatments.   -Continue Duonebs, continue pulmicort nebs  -Pulmonary willl see him again on outpatient follow up 08/24/19.   -They will arrange PFTs.  - He was given lasix 20 mg IV daily x 4 total doses during the hospitalization.     CAD  - stable, no chest pain symptoms, resumed his home heart medications.    Tobacco abuse - He reports he quit several months ago.  Polysubstance abuse  - UDS positive for benzo, opioid and barbituates.   Anxiety - alprazolam as needed only for severe symptoms.  Type 2 DM with steroid induced hyperglycemia  - increased doses of insulin and follow closely especially while on IV steroids.    -Pt will need to DC home on insulin.  He has used insulin before.  He prefers vials and syringes.  He will be on steroids for at least 2 weeks after discharge and will need insulin plus metformin to help control his steroid induced hyperglycemia -d/c home with novolin 70/30--50 units bid -instructed pt to continue checking CBGs ac/hs      overall 50% improved at d/c on prednisone tapered off 08/20/19 and about the same as at d/c but no longer using neb / maint on Anoro      History of Present Illness  08/24/2019  Pulmonary/ 1st office eval/ Nile Dorning / [place] [facility]  Chief Complaint  Patient presents with   Follow-up    shortness of breath with exertion  Dyspnea:  MMRC3 = can't  walk 100 yards even at a slow pace at a flat grade s stopping due to sob   Cough: resolved  Sleep: flat  SABA use: combivent and anoro both in am then no other saba  rec Plan A = Automatic = Always=   Anoro one click take two drags Plan B = Backup (to supplement plan A, not to replace it) Only use your albuterol inhaler as a rescue medication Try albuterol 15 min before an activity that you know would make you short of breath     Plan C = Crisis (instead of Plan B but only if Plan B stops working) - only use your albuterol  nebulizer if you first try Plan B  Make sure you check your oxygen saturations at highest level of activity  Please schedule a follow up visit in 3 months with PFTs  but call sooner if needed  - ok to delay if you haven't got your insurance.    11/29/2019  f/u ov/Tierre Gerard re: GOLD 0 on anoro with resolution of cough and doe improved Chief Complaint  Patient presents with   Follow-up    NO complaints  Dyspnea:  MMRC1 = can walk nl pace, flat grade, can't hurry or go uphills or steps s sob   Cough: none  Sleeping: able to lie down flat 2 pillows SABA use: none at all  02: none  rec No change in recommendations  To get the most out of exercise, you need to be continuously aware that you are short of breath   04/30/2020  f/u ov/[place] office/Takela Varden re: GOLD 0 copd/AB Chief Complaint  Patient presents with   Follow-up    Breathing has not improved since his last visit. He has had prod cough with brown sputum for the past 5 wks. He is using his albuterol inhaler 7 x per on average and rarely uses neb.   Dyspnea: MMRC2 = can't walk a nl pace on a flat grade s sob but does fine slow and flat eg Cough: brown esp in am x 5 weeks Sleeping: poorly due to cough SABA use: way too much  02: none Covid status: x 2 last 09/26/19  Lung cancer screening: won't be due until 03/2021 rec Plan A = Automatic = Always=    Stop anoro and start breztri Take 2 puffs first thing in am and then another 2 puffs about 12 hours later.  Work on inhaler technique:  Plan B = Backup (to supplement plan A, not to replace it) Only use your albuterol inhaler as a rescue medication  Plan C = Crisis (instead of Plan B but only if Plan B stops working) - only use your albuterol nebulizer if you first try Plan B  zpak      06/27/2020  f/u ov/[place] office/Keyani Rigdon re: back on anoro  Chief Complaint  Patient presents with   Follow-up    PNA approx 3 wks ago and was hospitalized at [facility]. His breathing is doing  better overall, still has trouble when out in the heat.He has a prod cough with tan sputum. He uses his albuterol inhaler about 2 x per wk, rarely uses his neb.   Dyspnea:  No problem walking indoors/ does 20 minutes of squats / always better p prednisone  Cough: light gray in am  Sleeping: able to lie flat  SABA use: makes him jittery  02: none  Covid status: vax x 2 needs booster Rec Plan A = Automatic = Always=  breztri one twice daily  (symbicort 160 ) one puff first thing in am and 12 hours later Work on inhaler technique:    Plan B = Backup (to supplement plan A, not to replace it) Only use your albuterol inhaler as a rescue medication  Plan C = Crisis (instead of Plan B but only if Plan B stops working) - only use your albuterol nebulizer (one half vile)  if you first try Plan B and it fails to help > ok to use the nebulizer up to every 4 hours but if start needing it regularly call for immediate appointment Please schedule a follow up office visit in 6 weeks, call sooner if needed - bring inhlaers with you    08/08/2020  f/u ov/Manna Gose re: copd 0 / ab maint on  anoro daily  Chief Complaint  Patient presents with   Follow-up    Pt    Dyspnea:  walked a track x 20 min with gen ant cp both times rad to shoulders and eased off afterwards / less noticeable at slower pace - never cp at rest, no n or v or diaphoresis  Cough: none  Sleeping: able to lie flat  SABA use: proair twice this week  02: none  Covid status:   vax x 2 never infected    No obvious day to day or daytime variability or assoc excess/ purulent sputum or mucus plugs or hemoptysis or  subjective wheeze or overt sinus or hb symptoms.   Sleeping  without nocturnal  or early am exacerbation  of respiratory  c/o's or need for noct saba. Also denies any obvious fluctuation of symptoms with weather or environmental changes or other aggravating or alleviating factors except as outlined above   No unusual exposure hx or h/o  childhood pna/ asthma or knowledge of premature birth.  Current Allergies, Complete Past Medical History, Past Surgical History, Family History, and Social History were reviewed in [employer] record.  ROS  The following are not active complaints unless bolded Hoarseness, sore throat, dysphagia, dental problems, itching, sneezing,  nasal congestion or discharge of excess mucus or purulent secretions, ear ache,   fever, chills, sweats, unintended wt loss or wt gain, classically pleuritic  cp,  orthopnea pnd or arm/hand swelling  or leg swelling, presyncope, palpitations, abdominal pain, anorexia, nausea, vomiting, diarrhea  or change in bowel habits or change in bladder habits, change in stools or change in urine, dysuria, hematuria,  rash, arthralgias, visual complaints, headache, numbness, weakness or ataxia or problems with walking or coordination,  change in mood or  memory.        Current Meds  Medication Sig   albuterol (PROVENTIL) (2.5 MG/3ML) 0.083% nebulizer solution Take 3 mLs (2.5 mg total) by nebulization every 4 (four) hours as needed for wheezing or shortness of breath.   ALBUTEROL IN Inhale 2 puffs into the lungs every 4 (four) hours as needed.   atorvastatin (LIPITOR) 40 MG tablet Take 1 tablet (40 mg total) by mouth daily at 6 PM.   butalbital-acetaminophen-caffeine (FIORICET) 50-325-40 MG tablet Take 1 tablet by mouth every 6 (six) hours as needed for migraine.   clopidogrel (PLAVIX) 75 MG tablet Take 1 tablet (75 mg total) by mouth daily.   folic acid (FOLVITE) 1 MG tablet Take 1 tablet (1 mg total) by mouth daily.   insulin NPH-regular Human (NOVOLIN 70/30) (70-30) 100 UNIT/ML injection Inject 50 Units into the skin 2 (two) times daily with a meal.  losartan (COZAAR) 25 MG tablet Take 1 tablet (25 mg total) by mouth daily.   metFORMIN (GLUCOPHAGE) 1000 MG tablet Take 1 tablet (1,000 mg total) by mouth 2 (two) times daily with a meal.   metoprolol  succinate (TOPROL-XL) 25 MG 24 hr tablet Take 1 tablet (25 mg total) by mouth daily.   montelukast (SINGULAIR) 10 MG tablet Take 1 tablet (10 mg total) by mouth at bedtime.   Multiple Vitamin (MULTIVITAMIN WITH MINERALS) TABS tablet Take 1 tablet by mouth daily.   thiamine 100 MG tablet Take 2.5 tablets (250 mg total) by mouth daily. (Patient taking differently: Take 100 mg by mouth daily.)   umeclidinium-vilanterol (ANORO ELLIPTA) 62.5-25 MCG/INH AEPB Inhale 1 puff into the lungs daily.                            Past Medical History:  Diagnosis Date   COPD (chronic obstructive pulmonary disease) (HCC)    Coronary artery disease    DM (diabetes mellitus) (HCC)    Heart attack (HCC)    HLD (hyperlipidemia)    HTN (hypertension)    Obesity    Stroke (HCC)          Objective:      08/08/2020       233  06/27/2020          232  04/30/2020          240   11/29/19 248 lb 6.4 oz (112.7 kg)  11/01/19 250 lb (113.4 kg)  10/25/19 250 lb (113.4 kg)    Vital signs reviewed  08/08/2020  - Note at rest 02 sats  97% on RA   General appearance:    pleasant amb wm nad     HEENT : pt wearing mask not removed for exam due to covid - 19 concerns.   NECK :  without JVD/Nodes/TM/ nl carotid upstrokes bilaterally   LUNGS: no acc muscle use,  Min barrel  contour chest wall with bilateral  slightly decreased bs s audible wheeze and  without cough on insp or exp maneuvers and min  Hyperresonant  to  percussion bilaterally     CV:  RRR  no s3 or murmur or increase in P2, and no edema   ABD:  soft and nontender with pos end  insp Hoover's  in the supine position. No bruits or organomegaly appreciated, bowel sounds nl  MS:   Nl gait/  ext warm without deformities, calf tenderness, cyanosis or clubbing No obvious joint restrictions   SKIN: warm and dry without lesions    NEURO:  alert, approp, nl sensorium with  no motor or cerebellar deficits apparent.                       Assessment

## 2020-08-08 NOTE — Patient Instructions (Signed)
Plan A = Automatic = Always=    Anoro one click in am   Plan B = Backup (to supplement plan A, not to replace it) Only use your albuterol inhaler as a rescue medication to be used if you can't catch your breath by resting or doing a relaxed purse lip breathing pattern.  - The less you use it, the better it will work when you need it. - Ok to use the inhaler up to 2 puffs  every 4 hours if you must but call for appointment if use goes up over your usual need - Don't leave home without it !!  (think of it like starter fluid or a  spare tire for your car)   Plan C = Crisis (instead of Plan B but only if Plan B stops working) - only use your albuterol nebulizer if you first try Plan B and it fails to help > ok to use the nebulizer up to every 4 hours but if start needing it regularly call for immediate appointment    Take lopressor (toprol / metaprolol) twice daily until you contact [place] and reduce you activity level for now to avoid exertion /stress.  Please schedule a follow up visit in  6 months but call sooner if needed

## 2020-08-09 ENCOUNTER — Encounter: Payer: Self-pay | Admitting: Internal Medicine

## 2020-08-09 DIAGNOSIS — R079 Chest pain, unspecified: Secondary | ICD-10-CM | POA: Insufficient documentation

## 2020-08-09 NOTE — Assessment & Plan Note (Signed)
Reported 08/08/2020  Stable pattern > rec increase lopressor to 25 mg bid and contact Dr Nadara Eaton  Has has h/o ihd but says this isn't "like his heart attack"  - reviewed the concept that reproducible cp with ex is angina until proven otherwise in this setting and referred back to cards.          Each maintenance medication was reviewed in detail including emphasizing most importantly the difference between maintenance and prns and under what circumstances the prns are to be triggered using an action plan format where appropriate.  Total time for H and P, chart review, counseling, reviewing elipta device(s) and generating customized AVS unique to this office visit / same day charting = 21 min

## 2020-08-09 NOTE — Assessment & Plan Note (Signed)
Quit smoking 02/2019 at wt 260  -  Spirometry only on 11/01/19  No obst p anoro prior to study and minimal concavity to f/v loop  - 04/30/2020  After extensive coaching inhaler device,  effectiveness =    75% > try breztri 2bid > "too jittery" but flared on anoro  - 06/27/2020  After extensive coaching inhaler device,  effectiveness =    75% > rechallenge with breztri one bid  - 08/08/2020  Improved on anoro > continue   Pt now more c/w Pt is Group B in terms of symptom/risk and laba/lama therefore appropriate rx at this point >>>  Continue anoro and prn saba  Re saba: I spent extra time with pt today reviewing appropriate use of albuterol for prn use on exertion with the following points: 1) saba is for relief of sob that does not improve by walking a slower pace or resting but rather if the pt does not improve after trying this first. 2) If the pt is convinced, as many are, that saba helps recover from activity faster then it's easy to tell if this is the case by re-challenging : ie stop, take the inhaler, then p 5 minutes try the exact same activity (intensity of workload) that just caused the symptoms and see if they are substantially diminished or not after saba 3) if there is an activity that reproducibly causes the symptoms, try the saba 15 min before the activity on alternate days   If in fact the saba really does help, then fine to continue to use it prn but advised may need to look closer at the maintenance regimen being used to achieve better control of airways disease with exertion.

## 2020-10-22 ENCOUNTER — Telehealth: Payer: Self-pay | Admitting: Internal Medicine

## 2020-10-22 NOTE — Telephone Encounter (Signed)
Form placed in Dr. Rolin Barry folder for him to sign when he's back in [place] office.

## 2020-10-31 ENCOUNTER — Telehealth: Payer: Self-pay | Admitting: Internal Medicine

## 2020-10-31 MED ORDER — ANORO ELLIPTA 62.5-25 MCG/INH IN AEPB: 1.0000 | INHALATION_SPRAY | Freq: Every day | RESPIRATORY_TRACT | 0 refills | Status: DC

## 2020-10-31 NOTE — Telephone Encounter (Signed)
Faxed over [facility] paperwork to [place] office with attn: [facility]/ [employer]. Will call patient to notify and offer sample.

## 2020-10-31 NOTE — Telephone Encounter (Signed)
Notified patient that the [employer] paperwork was faxed over to [facility] office. Offered to leave [facility] samples for patient to pick up. He stated he would come to the [place] office tomorrow to pick them up. Nothing further needed at this time.

## 2020-10-31 NOTE — Addendum Note (Signed)
Addended by: Carleene Mains D on: 10/31/2020 12:52 PM   Modules accepted: Orders

## 2020-11-15 LAB — COLOGUARD: COLOGUARD: NEGATIVE

## 2020-11-16 ENCOUNTER — Telehealth: Payer: Self-pay | Admitting: Internal Medicine

## 2020-11-16 ENCOUNTER — Other Ambulatory Visit: Payer: Self-pay

## 2020-11-16 MED ORDER — ANORO ELLIPTA 62.5-25 MCG/ACT IN AEPB: 1.0000 | INHALATION_SPRAY | Freq: Every day | RESPIRATORY_TRACT | 5 refills | Status: DC

## 2020-11-16 MED ORDER — ANORO ELLIPTA 62.5-25 MCG/ACT IN AEPB: 1.0000 | INHALATION_SPRAY | Freq: Every day | RESPIRATORY_TRACT | 5 refills | Status: AC

## 2020-11-16 NOTE — Telephone Encounter (Signed)
Fine with me

## 2020-11-16 NOTE — Telephone Encounter (Signed)
Rx for Anoro has been sent to preferred pharmacy for pt. Called and spoke with pt letting him know this had been done and he verbalized understanding. Stated to pt if insurance will not cover med and if Rx is too expensive for him to call office back and we could further discuss with MW. Pt verbalized understanding. Nothing further needed.

## 2020-11-16 NOTE — Telephone Encounter (Signed)
Paperwork faxed back over to [employer].

## 2020-11-16 NOTE — Telephone Encounter (Signed)
Per notes, paperwork is for [employer]

## 2020-11-16 NOTE — Telephone Encounter (Signed)
Pt calling to see if MW can send in another rx for Anoro to the [facility] in [place]. Pt is at pharmacy now(high priority)wants to see if insurance covers it.   Dr. Sherene Sires please advise if it okay to send RX

## 2020-11-16 NOTE — Telephone Encounter (Signed)
Spoke to patient. He got a prescription filled for anoro already and no longer needs a sample.

## 2021-02-11 ENCOUNTER — Other Ambulatory Visit (HOSPITAL_COMMUNITY): Payer: Self-pay | Admitting: Adult Health Nurse Practitioner

## 2021-02-11 DIAGNOSIS — M542 Cervicalgia: Secondary | ICD-10-CM

## 2021-02-11 DIAGNOSIS — M5412 Radiculopathy, cervical region: Secondary | ICD-10-CM

## 2021-02-12 ENCOUNTER — Other Ambulatory Visit: Payer: Self-pay

## 2021-02-12 ENCOUNTER — Other Ambulatory Visit (HOSPITAL_COMMUNITY): Payer: Self-pay | Admitting: Adult Health Nurse Practitioner

## 2021-02-12 ENCOUNTER — Ambulatory Visit (HOSPITAL_COMMUNITY)
Admission: RE | Admit: 2021-02-12 | Discharge: 2021-02-12 | Disposition: A | Discharge status: Home / Self Care | Payer: Medicaid Other | Source: Ambulatory Visit | Attending: Adult Health Nurse Practitioner | Admitting: Adult Health Nurse Practitioner

## 2021-02-12 DIAGNOSIS — M542 Cervicalgia: Secondary | ICD-10-CM | POA: Insufficient documentation

## 2021-02-12 DIAGNOSIS — M5412 Radiculopathy, cervical region: Secondary | ICD-10-CM | POA: Diagnosis present

## 2021-03-13 ENCOUNTER — Encounter: Payer: Self-pay | Admitting: Physical Therapy

## 2021-03-13 ENCOUNTER — Other Ambulatory Visit: Payer: Self-pay

## 2021-03-13 ENCOUNTER — Ambulatory Visit: Payer: Medicaid Other | Attending: Physician Assistant | Admitting: Physical Therapy

## 2021-03-13 DIAGNOSIS — M6281 Muscle weakness (generalized): Secondary | ICD-10-CM | POA: Insufficient documentation

## 2021-03-13 DIAGNOSIS — M542 Cervicalgia: Secondary | ICD-10-CM | POA: Insufficient documentation

## 2021-03-13 DIAGNOSIS — R293 Abnormal posture: Secondary | ICD-10-CM | POA: Diagnosis present

## 2021-03-13 NOTE — Therapy (Signed)
[facility] [address] [place], [place], [address] Phone: [phone]   Fax:  [phone]  Physical Therapy Evaluation  Patient Details  Name: Robert Rich MRN: [id number] Date of Birth: 01-Feb-1965 Referring Provider (PT): Malon Kindle MD   Encounter Date: 03/13/2021   PT End of Session - 03/13/21 1041     Visit Number 1    Number of Visits 8    Date for PT Re-Evaluation 04/10/21    PT Start Time 0906    PT Stop Time 0932    PT Time Calculation (min) 26 min    Activity Tolerance Patient tolerated treatment well    Behavior During Therapy [facility] for tasks assessed/performed             Past Medical History:  Diagnosis Date   COPD (chronic obstructive pulmonary disease) (HCC)    Coronary artery disease    DM (diabetes mellitus) (HCC)    Heart attack (HCC)    HLD (hyperlipidemia)    HTN (hypertension)    Obesity    Stroke [facility])     Past Surgical History:  Procedure Laterality Date   INTRAVASCULAR PRESSURE WIRE/FFR STUDY N/A 03/18/2019   Procedure: INTRAVASCULAR PRESSURE WIRE/FFR STUDY;  Surgeon: [name], MD;  [facility];  Service: Cardiovascular;  Laterality: N/A;   LEFT HEART CATH AND CORONARY ANGIOGRAPHY N/A 03/18/2019   Procedure: LEFT HEART CATH AND CORONARY ANGIOGRAPHY;  Surgeon: [name], MD;  Location: [facility] INV[facility] CV [facility];  Service: Cardiovascular;  Laterality: N/A;   NO PAST SURGERIES      There were no vitals filed for this visit.    Subjective Assessment - 03/13/21 1044     Subjective COVID-19 screen performed prior to patient entering clinic.  The patient presents to the clinic today with c/o neck pain and radiation of symptoms down his left UE that includes numbness over the right firsat and second fingers.  He states the symptoms began in November of 2022.  He rates his pain at a 10/10 and has found nothing really helps.  He is trying a cervical range of HEP but states that all motions  hurt.  He presented to the clinic today with a cervical soft collar that he wears as needed.  He states he can only sleep about 4 hours.    Pertinent History HLD, HTN, "two small strokes", CAD, DM, MI, heart cath.    How long can you sit comfortably? 15 minutes.    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    Patient Stated Goals Would love to get out of pain and do things like he was able before.    Currently in Pain? Yes    Pain Score 10-Worst pain ever    Pain Location Neck    Pain Orientation Left    Pain Descriptors / Indicators Aching;Sharp;Numbness    Pain Type Acute pain    Pain Radiating Towards Down left UE to first and second fingers.    Pain Onset More than a month ago    Pain Frequency Constant    Aggravating Factors  Neck movement.    Pain Relieving Factors Nothing.    Effect of Pain on Daily Activities Unable to perform ADL's like he could before onset of pain and symptoms.                [facility] PT Assessment - 03/13/21 0001  Assessment   Medical Diagnosis Cervicalgia.    Referring Provider (PT) Malon Kindle MD    Onset Date/Surgical Date --   11/2020.   Hand Dominance Right      Precautions   Precautions None      Restrictions   Weight Bearing Restrictions No      Balance Screen   Has the patient fallen in the past 6 months No    Has the patient had a decrease in activity level because of a fear of falling?  No    Is the patient reluctant to leave their home because of a fear of falling?  No      Home Environment   Living Environment Private residence      Prior Function   Level of Independence Independent      Posture/Postural Control   Posture/Postural Control Postural limitations    Postural Limitations Rounded Shoulders;Forward head    Posture Comments Decreased cervical lordosis.      Deep Tendon Reflexes   DTR Assessment Site Biceps;Brachioradialis;Triceps     Biceps DTR --   Bilateral 1+/4+.   Brachioradialis DTR --   Absent on left and 1+ on right.   Triceps DTR --   Absent on left and 1+ on right.     ROM / Strength   AROM / PROM / Strength AROM;Strength      AROM   Overall AROM Comments Right active cervical rotation is 30 degrees and left is 25 degrees.      Strength   Overall Strength Comments Left shoulder strength testing via manual muscle testing was very shaky and not graded above a 4-/5 for all major muscle groups.  He is right hand dominant and gave an output of 70# and left was 12#.      Palpation   Palpation comment The patient c/o diffuse left cervical paraspinal pain and over his left UT and meidal scapular border region.      Ambulation/Gait   Gait Comments WNL.                        Objective measurements completed on examination: See above findings.                     PT Long Term Goals - 03/13/21 1151       PT LONG TERM GOAL #1   Title Independent with a HEP.    Baseline No knowledge of appropriate ther ex.    Time 4    Period Weeks    Status New      PT LONG TERM GOAL #2   Title Increase active bilateral cervical rotation to 70 degrees+ so patient can turn head more easily while driving.    Baseline RT= 30 degrees and LT is 25 degrees.    Time 4    Period Weeks    Status New      PT LONG TERM GOAL #3   Title Eliminate UE symptoms.    Baseline Symptoms down left UE to first and second fingers of left hand.    Time 4    Period Weeks    Status New      PT LONG TERM GOAL #4   Title Sleep 6 hours undisturbed.    Baseline Patient can only sleep 4 hours.    Time 4    Period Weeks    Status New  Plan - 03/13/21 1133     Clinical Impression Statement The patient presents to OPPT with c/o severe left sided neck pain that has been ongoing since November of 2022.  He reports pain and numbness into his left UE and to the first and second finger  of his left hand.  He has global left shoulder weakness and his left grip is significantly weaker than his dominant right side.  His bilateral active cervical rotation is very limited and painful.  His posture is remarkable for a decrease in cervical lordosis.  Patient may benefit from skilled physical therapy intervention to address pain and deficits.    Personal Factors and Comorbidities Comorbidity 1;Comorbidity 2;Other    Comorbidities HLD, HTN, "two small strokes", CAD, DM, MI, heart cath.    Examination-Activity Limitations Sleep;Other    Examination-Participation Restrictions Other    Stability/Clinical Decision Making Evolving/Moderate complexity    Clinical Decision Making Low    Rehab Potential Fair    PT Frequency 2x / week    PT Duration 4 weeks    PT Treatment/Interventions ADLs/Self Care Home Management;Cryotherapy;Electrical Stimulation;Ultrasound;Traction;Moist Heat;Therapeutic activities;Therapeutic exercise;Manual techniques;Patient/family education;Passive range of motion;Dry needling    PT Next Visit Plan Cervical range of motion and postural exercises, Combo e'stim/US and STW/M, try manual traction and based on response can start intermittment traction at 13#, left shoulder RW4.    PT Home Exercise Plan Patient currently trying active cervical rotation and extension per his referring physician.    Consulted and Agree with Plan of Care Patient             Patient will benefit from skilled therapeutic intervention in order to improve the following deficits and impairments:  Decreased activity tolerance, Decreased range of motion, Decreased strength, Increased muscle spasms, Postural dysfunction, Pain  Visit Diagnosis: Cervicalgia - Plan: PT plan of care cert/re-cert  Abnormal posture - Plan: PT plan of care cert/re-cert  Muscle weakness (generalized) - Plan: PT plan of care cert/re-cert     Problem List Patient Active Problem List   Diagnosis Date Noted    Exertional chest pain 08/09/2020   COPD (chronic obstructive pulmonary disease) (HCC) 12/20/2019   Type 2 diabetes mellitus without complication (HCC) 12/20/2019   COPD GOLD 0/  group D  08/24/2019   Multiple pulmonary nodules determined by computed tomography of lung 08/24/2019   COPD exacerbation (HCC)    COPD with acute exacerbation (HCC) 08/04/2019   Coronary artery disease    HLD (hyperlipidemia)    Leukocytosis    Hyperglycemia    Steroid-induced hyperglycemia    Acute respiratory failure with hypoxia (HCC)    Morbid obesity due to excess calories (HCC)    Hypertensive crisis    Anxiety    Substance abuse (HCC)    Cardiac arrest (HCC) 03/17/2019   2019 novel coronavirus disease (COVID-19) 12/07/2018   Tobacco abuse 12/06/2018    Marlene Beidler, [place], PT 03/13/2021, 12:02 PM  [facility] [address] [place], [place], [address] Phone: [phone]   Fax:  [phone]  Name: Robert Rich MRN: [id number] Date of Birth: 1965/11/06

## 2021-03-15 ENCOUNTER — Other Ambulatory Visit: Payer: Self-pay

## 2021-03-15 ENCOUNTER — Ambulatory Visit: Payer: Medicaid Other | Admitting: Physical Therapy

## 2021-03-15 DIAGNOSIS — R293 Abnormal posture: Secondary | ICD-10-CM

## 2021-03-15 DIAGNOSIS — M542 Cervicalgia: Secondary | ICD-10-CM | POA: Diagnosis not present

## 2021-03-15 DIAGNOSIS — M6281 Muscle weakness (generalized): Secondary | ICD-10-CM

## 2021-03-15 NOTE — Therapy (Signed)
[facility] [address] [place], [place], [address] Phone: [phone]   Fax:  [phone]  Physical Therapy Treatment  Patient Details  Name: MONTERO MONCE MRN: [id number] Date of Birth: [birth year] Referring Provider (PT): Malon Kindle MD   Encounter Date: 03/15/2021   PT End of Session - 03/15/21 1303     Visit Number 2    Number of Visits 8    Date for PT Re-Evaluation 04/10/21    PT Start Time 1118    PT Stop Time 1210    PT Time Calculation (min) 52 min    Activity Tolerance Patient tolerated treatment well    Behavior During Therapy [facility] for tasks assessed/performed             Past Medical History:  Diagnosis Date   COPD (chronic obstructive pulmonary disease) (HCC)    Coronary artery disease    DM (diabetes mellitus) (HCC)    Heart attack (HCC)    HLD (hyperlipidemia)    HTN (hypertension)    Obesity    Stroke [facility])     Past Surgical History:  Procedure Laterality Date   INTRAVASCULAR PRESSURE WIRE/FFR STUDY N/A 03/18/2019   Procedure: INTRAVASCULAR PRESSURE WIRE/FFR STUDY;  Surgeon: [name], MD;  [facility];  Service: Cardiovascular;  Laterality: N/A;   LEFT HEART CATH AND CORONARY ANGIOGRAPHY N/A 03/18/2019   Procedure: LEFT HEART CATH AND CORONARY ANGIOGRAPHY;  Surgeon: [name], MD;  [facility]: [facility];  Service: Cardiovascular;  Laterality: N/A;   NO PAST SURGERIES      There were no vitals filed for this visit.   Subjective Assessment - 03/15/21 1304     Subjective Same.  Pain 10/10.    Pertinent History HLD, HTN, "two small strokes", CAD, DM, MI, heart cath.    How long can you sit comfortably? 15 minutes.    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    Patient Stated Goals Would love to get out of pain and do things like he was able before.    Currently in Pain? Yes    Pain Score 10-Worst pain  ever    Pain Location Neck    Pain Orientation Left    Pain Descriptors / Indicators Aching;Sharp;Numbness    Pain Type Acute pain    Pain Onset More than a month ago                               [facility] Adult PT Treatment/Exercise - 03/15/21 0001       Modalities   Modalities Electrical Stimulation;Ultrasound;Traction      Electrical Stimulation   Electrical Stimulation Location Left cervical region.    Electrical Stimulation Action Low-level IFC at 80-150 Hz.    Electrical Stimulation Parameters 40% scan x 15 minutes.    Electrical Stimulation Goals Tone;Pain      Ultrasound   Ultrasound Location Left cervical.    Ultrasound Parameters Combo e'stim/US at 1.50 W/CM2 x 8 minutes.      Traction   Type of Traction Cervical    Min (lbs) 5    Max (lbs) 13    Hold Time 99    Rest Time 5    Time 15  PT Long Term Goals - 03/13/21 1151       PT LONG TERM GOAL #1   Title Independent with a HEP.    Baseline No knowledge of appropriate ther ex.    Time 4    Period Weeks    Status New      PT LONG TERM GOAL #2   Title Increase active bilateral cervical rotation to 70 degrees+ so patient can turn head more easily while driving.    Baseline RT= 30 degrees and LT is 25 degrees.    Time 4    Period Weeks    Status New      PT LONG TERM GOAL #3   Title Eliminate UE symptoms.    Baseline Symptoms down left UE to first and second fingers of left hand.    Time 4    Period Weeks    Status New      PT LONG TERM GOAL #4   Title Sleep 6 hours undisturbed.    Baseline Patient can only sleep 4 hours.    Time 4    Period Weeks    Status New                   Plan - 03/15/21 1313     Clinical Impression Statement Patient did well with treatment.  He felt better following traction.  Patient coming out of his soft collar during the day and working on improving this range of motion.    Personal Factors and  Comorbidities Comorbidity 1;Comorbidity 2;Other    Comorbidities HLD, HTN, "two small strokes", CAD, DM, MI, heart cath.    Examination-Activity Limitations Sleep;Other    Examination-Participation Restrictions Other    Stability/Clinical Decision Making Evolving/Moderate complexity    Rehab Potential Fair    PT Frequency 2x / week    PT Duration 4 weeks    PT Treatment/Interventions ADLs/Self Care Home Management;Cryotherapy;Electrical Stimulation;Ultrasound;Traction;Moist Heat;Therapeutic activities;Therapeutic exercise;Manual techniques;Patient/family education;Passive range of motion;Dry needling    PT Next Visit Plan Cervical range of motion and postural exercises, Combo e'stim/US and STW/M, try manual traction and based on response can start intermittment traction at 13#, left shoulder RW4.    PT Home Exercise Plan Patient currently trying active cervical rotation and extension per his referring physician.    Consulted and Agree with Plan of Care Patient             Patient will benefit from skilled therapeutic intervention in order to improve the following deficits and impairments:  Decreased activity tolerance, Decreased range of motion, Decreased strength, Increased muscle spasms, Postural dysfunction, Pain  Visit Diagnosis: Cervicalgia  Abnormal posture  Muscle weakness (generalized)     Problem List Patient Active Problem List   Diagnosis Date Noted   Exertional chest pain 08/09/2020   COPD (chronic obstructive pulmonary disease) (HCC) 12/20/2019   Type 2 diabetes mellitus without complication (HCC) 12/20/2019   COPD GOLD 0/  group D  08/24/2019   Multiple pulmonary nodules determined by computed tomography of lung 08/24/2019   COPD exacerbation (HCC)    COPD with acute exacerbation (HCC) 08/04/2019   Coronary artery disease    HLD (hyperlipidemia)    Leukocytosis    Hyperglycemia    Steroid-induced hyperglycemia    Acute respiratory failure with hypoxia (HCC)     Morbid obesity due to excess calories (HCC)    Hypertensive crisis    Anxiety    Substance abuse (HCC)    Cardiac arrest (HCC) 03/17/2019  2019 novel coronavirus disease (COVID-19) 12/07/2018   Tobacco abuse 12/06/2018    Shandora Koogler, [place], PT 03/15/2021, 1:16 PM  [facility] [address] [place], [place], [address] Phone: [phone]   Fax:  [phone]  Name: CIEL YANES MRN: [id number] Date of Birth: October 25, 1965

## 2021-03-20 ENCOUNTER — Ambulatory Visit: Payer: Medicaid Other | Admitting: Physical Therapy

## 2021-03-20 ENCOUNTER — Emergency Department (HOSPITAL_COMMUNITY): Payer: Medicaid Other

## 2021-03-20 ENCOUNTER — Other Ambulatory Visit: Payer: Self-pay

## 2021-03-20 ENCOUNTER — Encounter (HOSPITAL_COMMUNITY): Payer: Self-pay | Admitting: Emergency Medicine

## 2021-03-20 ENCOUNTER — Emergency Department (HOSPITAL_COMMUNITY)
Admission: EM | Admit: 2021-03-20 | Discharge: 2021-03-20 | Disposition: A | Discharge status: Home / Self Care | Payer: Medicaid Other | Attending: Emergency Medicine | Admitting: Emergency Medicine

## 2021-03-20 DIAGNOSIS — E119 Type 2 diabetes mellitus without complications: Secondary | ICD-10-CM | POA: Diagnosis not present

## 2021-03-20 DIAGNOSIS — I2510 Atherosclerotic heart disease of native coronary artery without angina pectoris: Secondary | ICD-10-CM | POA: Insufficient documentation

## 2021-03-20 DIAGNOSIS — M79601 Pain in right arm: Secondary | ICD-10-CM | POA: Diagnosis not present

## 2021-03-20 DIAGNOSIS — Z951 Presence of aortocoronary bypass graft: Secondary | ICD-10-CM | POA: Diagnosis not present

## 2021-03-20 DIAGNOSIS — M542 Cervicalgia: Secondary | ICD-10-CM | POA: Diagnosis not present

## 2021-03-20 DIAGNOSIS — I10 Essential (primary) hypertension: Secondary | ICD-10-CM | POA: Insufficient documentation

## 2021-03-20 DIAGNOSIS — S4991XA Unspecified injury of right shoulder and upper arm, initial encounter: Secondary | ICD-10-CM

## 2021-03-20 DIAGNOSIS — M6281 Muscle weakness (generalized): Secondary | ICD-10-CM

## 2021-03-20 DIAGNOSIS — R293 Abnormal posture: Secondary | ICD-10-CM

## 2021-03-20 DIAGNOSIS — Z87891 Personal history of nicotine dependence: Secondary | ICD-10-CM | POA: Diagnosis not present

## 2021-03-20 DIAGNOSIS — J449 Chronic obstructive pulmonary disease, unspecified: Secondary | ICD-10-CM | POA: Diagnosis not present

## 2021-03-20 MED ORDER — AMOXICILLIN-POT CLAVULANATE 875-125 MG PO TABS
1.0000 | ORAL_TABLET | Freq: Once | ORAL | Status: AC
  Administered 2021-03-20: 1 via ORAL
  Filled 2021-03-20: qty 1

## 2021-03-20 MED ORDER — AMOXICILLIN-POT CLAVULANATE 875-125 MG PO TABS: 1.0000 | ORAL_TABLET | Freq: Two times a day (BID) | ORAL | 0 refills | Status: AC
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
## 2021-03-20 NOTE — Therapy (Signed)
[facility] [address] [place], [place], [address] Phone: [phone]   Fax:  [phone]  Physical Therapy Treatment  Patient Details  Name: Robert Rich MRN: [id number] Date of Birth: 03/31/1965 Referring Provider (PT): Malon Kindle MD   Encounter Date: 03/20/2021   PT End of Session - 03/20/21 1207     Visit Number 3    Number of Visits 8    Date for PT Re-Evaluation 04/10/21    PT Start Time 1115    PT Stop Time 1200    PT Time Calculation (min) 45 min    Activity Tolerance Patient tolerated treatment well    Behavior During Therapy [facility] for tasks assessed/performed             Past Medical History:  Diagnosis Date   COPD (chronic obstructive pulmonary disease) (HCC)    Coronary artery disease    DM (diabetes mellitus) (HCC)    Heart attack (HCC)    HLD (hyperlipidemia)    HTN (hypertension)    Obesity    Stroke [facility])     Past Surgical History:  Procedure Laterality Date   INTRAVASCULAR PRESSURE WIRE/FFR STUDY N/A 03/18/2019   Procedure: INTRAVASCULAR PRESSURE WIRE/FFR STUDY;  Surgeon: [name], MD;  [facility];  Service: Cardiovascular;  Laterality: N/A;   LEFT HEART CATH AND CORONARY ANGIOGRAPHY N/A 03/18/2019   Procedure: LEFT HEART CATH AND CORONARY ANGIOGRAPHY;  Surgeon: [name], MD;  [facility]: [facility];  Service: Cardiovascular;  Laterality: N/A;   NO PAST SURGERIES      There were no vitals filed for this visit.   Subjective Assessment - 03/20/21 1139     Subjective COVID-19 screen performed prior to patient entering clinic.  Patient into clinic today with right elbow in bandage due to tripping on a curb and falling.  He liked tratction and wants to try it again.  He is with continued high pain-level and symptoms down left UE.    Pertinent History HLD, HTN, "two small strokes", CAD, DM, MI, heart cath.    How long can you sit comfortably? 15 minutes.    Diagnostic tests  MRI: Multilevel DDD, Forminal stenosis on left at C3-4 and bilaterally at C4-5, C5-6 and C6-7.    Patient Stated Goals Would love to get out of pain and do things like he was able before.    Currently in Pain? Yes    Pain Score 10-Worst pain ever    Pain Location Neck    Pain Orientation Left    Pain Descriptors / Indicators Aching    Pain Type Acute pain    Pain Onset More than a month ago    Multiple Pain Sites No                               [facility] Adult PT Treatment/Exercise - 03/20/21 0001       Modalities   Modalities Electrical Stimulation;Ultrasound      Electrical Stimulation   Electrical Stimulation Location Left cervical.    Electrical Stimulation Action Low-level IFC at 80-150 Hz.    Electrical Stimulation Parameters 40% scan x 15 minutes.    Electrical Stimulation Goals Tone;Pain      Ultrasound   Ultrasound Location Left cervical.    Ultrasound Parameters Combo e'stim/US at 1.50 W/CM2 x 8 minutes.      Traction   Type of Traction Cervical  Min (lbs) 5    Max (lbs) 15    Hold Time 99    Rest Time 5    Time 10                          PT Long Term Goals - 03/13/21 1151       PT LONG TERM GOAL #1   Title Independent with a HEP.    Baseline No knowledge of appropriate ther ex.    Time 4    Period Weeks    Status New      PT LONG TERM GOAL #2   Title Increase active bilateral cervical rotation to 70 degrees+ so patient can turn head more easily while driving.    Baseline RT= 30 degrees and LT is 25 degrees.    Time 4    Period Weeks    Status New      PT LONG TERM GOAL #3   Title Eliminate UE symptoms.    Baseline Symptoms down left UE to first and second fingers of left hand.    Time 4    Period Weeks    Status New      PT LONG TERM GOAL #4   Title Sleep 6 hours undisturbed.    Baseline Patient can only sleep 4 hours.    Time 4    Period Weeks    Status New                   Plan -  03/20/21 1148     Clinical Impression Statement Patient presented to the clinic with continued high neck pain-level.  He had a fall an dinjured his right elbow but states that he had his collar on.  Increased intermittment traction to 15# and he tolerated without complaint.  He is working on his range of motion exercies and is noting some improvement.    Personal Factors and Comorbidities Comorbidity 1;Comorbidity 2;Other    Comorbidities HLD, HTN, "two small strokes", CAD, DM, MI, heart cath.    Examination-Activity Limitations Sleep;Other    Examination-Participation Restrictions Other    Stability/Clinical Decision Making Evolving/Moderate complexity    Rehab Potential Fair    PT Frequency 2x / week    PT Duration 4 weeks    PT Treatment/Interventions ADLs/Self Care Home Management;Cryotherapy;Electrical Stimulation;Ultrasound;Traction;Moist Heat;Therapeutic activities;Therapeutic exercise;Manual techniques;Patient/family education;Passive range of motion;Dry needling    PT Next Visit Plan Cervical range of motion and postural exercises, Combo e'stim/US and STW/M, try manual traction and based on response can start intermittment traction at 13#, left shoulder RW4.    PT Home Exercise Plan Patient currently trying active cervical rotation and extension per his referring physician.    Consulted and Agree with Plan of Care Patient             Patient will benefit from skilled therapeutic intervention in order to improve the following deficits and impairments:  Decreased activity tolerance, Decreased range of motion, Decreased strength, Increased muscle spasms, Postural dysfunction, Pain  Visit Diagnosis: Cervicalgia  Abnormal posture  Muscle weakness (generalized)     Problem List Patient Active Problem List   Diagnosis Date Noted   Exertional chest pain 08/09/2020   COPD (chronic obstructive pulmonary disease) (HCC) 12/20/2019   Type 2 diabetes mellitus without complication  (HCC) 12/20/2019   COPD GOLD 0/  group D  08/24/2019   Multiple pulmonary nodules determined by computed tomography of lung 08/24/2019   COPD  exacerbation (HCC)    COPD with acute exacerbation (HCC) 08/04/2019   Coronary artery disease    HLD (hyperlipidemia)    Leukocytosis    Hyperglycemia    Steroid-induced hyperglycemia    Acute respiratory failure with hypoxia (HCC)    Morbid obesity due to excess calories (HCC)    Hypertensive crisis    Anxiety    Substance abuse (HCC)    Cardiac arrest (HCC) 03/17/2019   2019 novel coronavirus disease (COVID-19) 12/07/2018   Tobacco abuse 12/06/2018    Naithan Delage, [place], PT 03/20/2021, 12:13 PM  [facility] [address] [place], [place], [address] Phone: [phone]   Fax:  [phone]  Name: Robert Rich MRN: [id number] Date of Birth: 07/15/1965
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
## 2021-03-20 NOTE — Discharge Instructions (Signed)
You were evaluated in the Emergency Department and after careful evaluation, we did not find any emergent condition requiring admission or further testing in the hospital.  Your exam/testing today was overall reassuring.  X-rays did not show any broken bones.  Recommend Tylenol or Motrin at home for discomfort.  Also important you take the Augmentin antibiotic to prevent infection.  If you are still experiencing significant pain after 2 weeks, recommend follow-up with an orthopedic specialist.  Please return to the Emergency Department if you experience any worsening of your condition.  Thank you for allowing [place] to be a part of your care.

## 2021-03-22 ENCOUNTER — Ambulatory Visit: Payer: Medicaid Other | Admitting: Physical Therapy

## 2021-03-22 ENCOUNTER — Other Ambulatory Visit: Payer: Self-pay

## 2021-03-22 DIAGNOSIS — M542 Cervicalgia: Secondary | ICD-10-CM

## 2021-03-22 DIAGNOSIS — R293 Abnormal posture: Secondary | ICD-10-CM

## 2021-03-22 DIAGNOSIS — M6281 Muscle weakness (generalized): Secondary | ICD-10-CM

## 2021-03-22 NOTE — Therapy (Signed)
[facility] [address] [place], [place], [address] Phone: [phone]   Fax:  [phone]  Physical Therapy Treatment  Patient Details  Name: Robert Rich MRN: [id number] Date of Birth: 25-May-1965 Referring Provider (PT): Malon Kindle MD   Encounter Date: 03/22/2021   PT End of Session - 03/22/21 1259     Visit Number 4    Number of Visits 8    Date for PT Re-Evaluation 04/10/21    PT Start Time 1115    PT Stop Time 1205    PT Time Calculation (min) 50 min    Activity Tolerance Patient tolerated treatment well    Behavior During Therapy [facility] for tasks assessed/performed             Past Medical History:  Diagnosis Date   COPD (chronic obstructive pulmonary disease) (HCC)    Coronary artery disease    DM (diabetes mellitus) (HCC)    Heart attack (HCC)    HLD (hyperlipidemia)    HTN (hypertension)    Obesity    Stroke [facility])     Past Surgical History:  Procedure Laterality Date   INTRAVASCULAR PRESSURE WIRE/FFR STUDY N/A 03/18/2019   Procedure: INTRAVASCULAR PRESSURE WIRE/FFR STUDY;  Surgeon: [name], MD;  Location[facility];  Service: Cardiovascular;  Laterality: N/A;   LEFT HEART CATH AND CORONARY ANGIOGRAPHY N/A 03/18/2019   Procedure: LEFT HEART CATH AND CORONARY ANGIOGRAPHY;  Surgeon: [name], MD;  Location: [facility] INV[facility] CV LAB;  Service: Cardiovascular;  Laterality: N/A;   NO PAST SURGERIES      There were no vitals filed for this visit.   Subjective Assessment - 03/22/21 1300     Subjective Patient states that he felt great aftr last treatment but then pain, especially in his left UE came back.    Pertinent History HLD, HTN, "two small strokes", CAD, DM, MI, heart cath.    How long can you sit comfortably? 15 minutes.    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    Patient Stated Goals Would love to get out of pain and do  things like he was able before.    Currently in Pain? Yes    Pain Score 10-Worst pain ever    Pain Location Neck    Pain Orientation Left    Pain Descriptors / Indicators Aching    Pain Type Acute pain    Pain Onset More than a month ago                [facility] PT Assessment - 03/22/21 0001       AROM   Overall AROM Comments Bilateral active cervical rotation to 35 degrees.                           [facility] Adult PT Treatment/Exercise - 03/22/21 0001       Modalities   Modalities Electrical Stimulation;Ultrasound      Electrical Stimulation   Electrical Stimulation Location Left cervical.    Electrical Stimulation Action Low-level IFC at 80-150 Hz.    Electrical Stimulation Parameters 40% scan x 15 minutes.    Electrical Stimulation Goals Tone;Pain      Ultrasound   Ultrasound Location Left cervical.    Ultrasound Parameters Combo e'stim/US at 1.50 W/CM2 x 8 minutes.      Traction   Type of Traction Cervical  Min (lbs) 5    Max (lbs) 17    Hold Time 99    Rest Time 5    Time 10                     PT Education - 03/22/21 1300     Education Details Yellow theraband IR/ER with pictures provided.                 PT Long Term Goals - 03/13/21 1151       PT LONG TERM GOAL #1   Title Independent with a HEP.    Baseline No knowledge of appropriate ther ex.    Time 4    Period Weeks    Status New      PT LONG TERM GOAL #2   Title Increase active bilateral cervical rotation to 70 degrees+ so patient can turn head more easily while driving.    Baseline RT= 30 degrees and LT is 25 degrees.    Time 4    Period Weeks    Status New      PT LONG TERM GOAL #3   Title Eliminate UE symptoms.    Baseline Symptoms down left UE to first and second fingers of left hand.    Time 4    Period Weeks    Status New      PT LONG TERM GOAL #4   Title Sleep 6 hours undisturbed.    Baseline Patient can only sleep 4 hours.    Time 4     Period Weeks    Status New                   Plan - 03/22/21 1303     Clinical Impression Statement Patient feels tretaments are helping with a reported very good response after last treatment though his pain came back later.  His bilateral active cervical rotation has improved to 35 degrees.  He did well today with a 2# increase in intermittment traction.    Personal Factors and Comorbidities Comorbidity 1;Comorbidity 2;Other    Comorbidities HLD, HTN, "two small strokes", CAD, DM, MI, heart cath.    Examination-Activity Limitations Sleep;Other    Examination-Participation Restrictions Other    Stability/Clinical Decision Making Evolving/Moderate complexity    Rehab Potential Fair    PT Frequency 2x / week    PT Duration 4 weeks    PT Treatment/Interventions ADLs/Self Care Home Management;Cryotherapy;Electrical Stimulation;Ultrasound;Traction;Moist Heat;Therapeutic activities;Therapeutic exercise;Manual techniques;Patient/family education;Passive range of motion;Dry needling    PT Next Visit Plan 19# of intermittment cervical traction.    PT Home Exercise Plan Patient currently trying active cervical rotation and extension per his referring physician.    Consulted and Agree with Plan of Care Patient             Patient will benefit from skilled therapeutic intervention in order to improve the following deficits and impairments:  Decreased activity tolerance, Decreased range of motion, Decreased strength, Increased muscle spasms, Postural dysfunction, Pain  Visit Diagnosis: Cervicalgia  Abnormal posture  Muscle weakness (generalized)     Problem List Patient Active Problem List   Diagnosis Date Noted   Exertional chest pain 08/09/2020   COPD (chronic obstructive pulmonary disease) (HCC) 12/20/2019   Type 2 diabetes mellitus without complication (HCC) 12/20/2019   COPD GOLD 0/  group D  08/24/2019   Multiple pulmonary nodules determined by computed tomography of  lung 08/24/2019   COPD exacerbation (HCC)  COPD with acute exacerbation (HCC) 08/04/2019   Coronary artery disease    HLD (hyperlipidemia)    Leukocytosis    Hyperglycemia    Steroid-induced hyperglycemia    Acute respiratory failure with hypoxia (HCC)    Morbid obesity due to excess calories (HCC)    Hypertensive crisis    Anxiety    Substance abuse (HCC)    Cardiac arrest (HCC) 03/17/2019   2019 novel coronavirus disease (COVID-19) 12/07/2018   Tobacco abuse 12/06/2018    Deshone Lyssy, [place], PT 03/22/2021, 1:06 PM  [facility] [address] [place], [place], [address] Phone: [phone]   Fax:  [phone]  Name: BLAYN WHETSELL MRN: [id number] Date of Birth: 08-13-1965

## 2021-03-27 ENCOUNTER — Ambulatory Visit: Payer: Medicaid Other | Attending: Physician Assistant

## 2021-03-27 ENCOUNTER — Other Ambulatory Visit: Payer: Self-pay

## 2021-03-27 DIAGNOSIS — M542 Cervicalgia: Secondary | ICD-10-CM | POA: Insufficient documentation

## 2021-03-27 DIAGNOSIS — M6281 Muscle weakness (generalized): Secondary | ICD-10-CM | POA: Insufficient documentation

## 2021-03-27 DIAGNOSIS — R293 Abnormal posture: Secondary | ICD-10-CM | POA: Insufficient documentation

## 2021-03-27 NOTE — Therapy (Signed)
[place] [facility] ?[address] ?[place], [place], [address] ?Phone: [phone]   Fax:  [phone] ? ?Physical Therapy Treatment ? ?Patient Details  ?Name: Robert Rich ?MRN: [id number] ?Date of Birth: 1965/08/12 ?Referring Provider (PT): Esmond Plants MD ? ? ?Encounter Date: 03/27/2021 ? ? PT End of Session - 03/27/21 0958   ? ? Visit Number 5   ? Number of Visits 8   ? Date for PT Re-Evaluation 04/10/21   ? PT Start Time 0900   ? PT Stop Time 0955   ? PT Time Calculation (min) 55 min   ? Activity Tolerance Patient tolerated treatment well   ? Behavior During Therapy [facility] for tasks assessed/performed   ? ?  ?  ? ?  ? ? ?Past Medical History:  ?Diagnosis Date  ? COPD (chronic obstructive pulmonary disease) ([place])   ? Coronary artery disease   ? DM (diabetes mellitus) ([place])   ? Heart attack ([place])   ? HLD (hyperlipidemia)   ? HTN (hypertension)   ? Obesity   ? Stroke [facility])   ? ? ?Past Surgical History:  ?Procedure Laterality Date  ? INTRAVASCULAR PRESSURE WIRE/FFR STUDY N/A 03/18/2019  ? Procedure: INTRAVASCULAR PRESSURE WIRE/FFR STUDY;  Surgeon: Adrian Prows, MD;  Location: [place] CV LAB;  Service: Cardiovascular;  Laterality: N/A;  ? LEFT HEART CATH AND CORONARY ANGIOGRAPHY N/A 03/18/2019  ? Procedure: LEFT HEART CATH AND CORONARY ANGIOGRAPHY;  Surgeon: Adrian Prows, MD;  Location: [facility] CV LAB;  Service: Cardiovascular;  Laterality: N/A;  ? NO PAST SURGERIES    ? ? ?There were no vitals filed for this visit. ? ? Subjective Assessment - 03/27/21 0901   ? ? Subjective Patient reports that his neck is hurting quite a bit today. He notes that he feels good for about 3-4 hours after his appointment, but then his pain returns. He notes that he felt better after his first appointment than he did after his last one.   ? Pertinent History HLD, HTN, "two small strokes", CAD, DM, MI, heart cath.   ? How long can you sit comfortably? 15 minutes.   ? Diagnostic tests MRI: Multilevel  DDD, Forminal stenosis on left at C3-4 and bilaterally at C4-5, C5-6 and C6-7.   ? Patient Stated Goals Would love to get out of pain and do things like he was able before.   ? Currently in Pain? Yes   ? Pain Score 10-Worst pain ever   ? Pain Location Neck   ? Pain Onset More than a month ago   ? ?  ?  ? ?  ? ? ? ? ? ? ? ? ? ? ? ? ? ? ? ? ? ? ? ? [place] Adult PT Treatment/Exercise - 03/27/21 0001   ? ?  ? Exercises  ? Exercises Neck;Shoulder   ?  ? Neck Exercises: Seated  ? Other Seated Exercise Cervical SNAG's   right; 10 reps  ?  ? Shoulder Exercises: Isometric Strengthening  ? External Rotation Other (comment)   5 reps; limited due to pain  ? Internal Rotation Other (comment)   10 reps; 3-5 second hold  ?  ? Traction  ? Type of Traction Cervical   ? Min (lbs) 5   ? Max (lbs) 15   ? Hold Time 99   ? Rest Time 5   ? Time 15   ?  ? Manual Therapy  ? Manual Therapy Soft tissue mobilization;Joint mobilization   ?  Joint Mobilization Grade I-II cervical CPA's and right UPA's   ? Soft tissue mobilization left upper trapezius   ? ?  ?  ? ?  ? ? ? ? ? ? ? ? ? ? PT Education - 03/27/21 1052   ? ? Education Details anatomy, referred pain, neural symptoms   ? Person(s) Educated Patient   ? Methods Explanation   ? Comprehension Verbalized understanding   ? ?  ?  ? ?  ? ? ? ? ? ? PT Long Term Goals - 03/13/21 1151   ? ?  ? PT LONG TERM GOAL #1  ? Title Independent with a HEP.   ? Baseline No knowledge of appropriate ther ex.   ? Time 4   ? Period Weeks   ? Status New   ?  ? PT LONG TERM GOAL #2  ? Title Increase active bilateral cervical rotation to 70 degrees+ so patient can turn head more easily while driving.   ? Baseline RT= 30 degrees and LT is 25 degrees.   ? Time 4   ? Period Weeks   ? Status New   ?  ? PT LONG TERM GOAL #3  ? Title Eliminate UE symptoms.   ? Baseline Symptoms down left UE to first and second fingers of left hand.   ? Time 4   ? Period Weeks   ? Status New   ?  ? PT LONG TERM GOAL #4  ? Title Sleep 6 hours  undisturbed.   ? Baseline Patient can only sleep 4 hours.   ? Time 4   ? Period Weeks   ? Status New   ? ?  ?  ? ?  ? ? ? ? ? ? ? ? Plan - 03/27/21 0958   ? ? Clinical Impression Statement Patient presented with continued high symptom severity. He was introduced to light isometrics and AA cervical ROM which slightly aggravated his familiar left upper extremity symptoms. He required moderate cuing with isometrics to facilitate rotator cuff engagement while limiting his familiar symptoms. Manual therapy was able to slightly reduce his familiar symptoms for a brief period, but cervical traction was the most effective at reducing his symptoms. Patient reported that his neck felt better, but his shoulder and arm was still hurting upon the conclusion of treatment. He would continue to benefit from skilled physical therapy to address his remaining impairments to return to his prior level of function.   ? Personal Factors and Comorbidities Comorbidity 1;Comorbidity 2;Other   ? Comorbidities HLD, HTN, "two small strokes", CAD, DM, MI, heart cath.   ? Examination-Activity Limitations Sleep;Other   ? Examination-Participation Restrictions Other   ? Stability/Clinical Decision Making Evolving/Moderate complexity   ? Rehab Potential Fair   ? PT Frequency 2x / week   ? PT Duration 4 weeks   ? PT Treatment/Interventions ADLs/Self Care Home Management;Cryotherapy;Electrical Stimulation;Ultrasound;Traction;Moist Heat;Therapeutic activities;Therapeutic exercise;Manual techniques;Patient/family education;Passive range of motion;Dry needling   ? PT Next Visit Plan light cervical AROM, chin tucks in supine, e-stim to left shoulder and upper extremity, cervical traction   ? PT Home Exercise Plan Patient currently trying active cervical rotation and extension per his referring physician.   ? Consulted and Agree with Plan of Care Patient   ? ?  ?  ? ?  ? ? ?Patient will benefit from skilled therapeutic intervention in order to improve the  following deficits and impairments:  Decreased activity tolerance, Decreased range of motion, Decreased strength, Increased muscle  spasms, Postural dysfunction, Pain ? ?Visit Diagnosis: ?Cervicalgia ? ?Abnormal posture ? ?Muscle weakness (generalized) ? ? ? ? ?Problem List ?Patient Active Problem List  ? Diagnosis Date Noted  ? Exertional chest pain 08/09/2020  ? COPD (chronic obstructive pulmonary disease) ([place]) 12/20/2019  ? Type 2 diabetes mellitus without complication ([place]) [id number]  ? COPD GOLD 0/  group D  08/24/2019  ? Multiple pulmonary nodules determined by computed tomography of lung 08/24/2019  ? COPD exacerbation ([place])   ? COPD with acute exacerbation ([place]) 08/04/2019  ? Coronary artery disease   ? HLD (hyperlipidemia)   ? Leukocytosis   ? Hyperglycemia   ? Steroid-induced hyperglycemia   ? Acute respiratory failure with hypoxia ([place])   ? Morbid obesity due to excess calories ([place])   ? Hypertensive crisis   ? Anxiety   ? Substance abuse ([place])   ? Cardiac arrest ([place]) 03/17/2019  ? 2019 novel coronavirus disease (COVID-19) 12/07/2018  ? Tobacco abuse 12/06/2018  ? ? ?Darlin Coco, PT ?03/27/2021, 10:56 AM ? ?[place] ?[facility] ?[address] ?[place], [place], [address] ?Phone: [phone]   Fax:  [phone] ? ?Name: BRAEDAN VARIN ?MRN: [id number] ?Date of Birth: 1965-07-26 ? ? ? ?

## 2021-03-29 ENCOUNTER — Other Ambulatory Visit: Payer: Self-pay

## 2021-03-29 ENCOUNTER — Ambulatory Visit: Payer: Medicaid Other | Admitting: *Deleted

## 2021-03-29 DIAGNOSIS — R293 Abnormal posture: Secondary | ICD-10-CM

## 2021-03-29 DIAGNOSIS — M542 Cervicalgia: Secondary | ICD-10-CM | POA: Diagnosis not present

## 2021-03-29 DIAGNOSIS — M6281 Muscle weakness (generalized): Secondary | ICD-10-CM

## 2021-03-29 NOTE — Therapy (Signed)
[place] ?[facility] ?[place] ?[place], [place], [address] ?Phone: [phone]   Fax:  [phone] ? ?Physical Therapy Treatment ? ?Patient Details  ?Name: Robert Rich ?MRN: [id number] ?Date of Birth: [birth year] ?Referring Provider (PT): Esmond Plants MD ? ? ?Encounter Date: 03/29/2021 ? ? PT End of Session - 03/29/21 1038   ? ? Visit Number 6   ? Number of Visits 8   ? Date for PT Re-Evaluation 04/10/21   ? PT Start Time 8101   ? PT Stop Time 1035   ? PT Time Calculation (min) 50 min   ? ?  ?  ? ?  ? ? ?Past Medical History:  ?Diagnosis Date  ? COPD (chronic obstructive pulmonary disease) ([place])   ? Coronary artery disease   ? DM (diabetes mellitus) ([place])   ? Heart attack ([place])   ? HLD (hyperlipidemia)   ? HTN (hypertension)   ? Obesity   ? Stroke [facility])   ? ? ?Past Surgical History:  ?Procedure Laterality Date  ? INTRAVASCULAR PRESSURE WIRE/FFR STUDY N/A 03/18/2019  ? Procedure: INTRAVASCULAR PRESSURE WIRE/FFR STUDY;  Surgeon: Adrian Prows, MD;  Location: [place] [facility];  Service: Cardiovascular;  Laterality: N/A;  ? LEFT HEART CATH AND CORONARY ANGIOGRAPHY N/A 03/18/2019  ? Procedure: LEFT HEART CATH AND CORONARY ANGIOGRAPHY;  Surgeon: Adrian Prows, MD;  Location: [place] [facility];  Service: Cardiovascular;  Laterality: N/A;  ? NO PAST SURGERIES    ? ? ?There were no vitals filed for this visit. ? ? Subjective Assessment - 03/29/21 0948   ? ? Subjective Patient reports doing better after [place] combo, estim and traction. Exs flair me up   ? Pertinent History HLD, HTN, "two small strokes", CAD, DM, MI, heart cath.   ? How long can you sit comfortably? 15 minutes.   ? 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   ? Currently in Pain? Yes   ? Pain Score 6    ? Pain Location Neck   ? Pain Orientation Left   ? Pain Descriptors / Indicators Aching;Sore   ? Pain Type Acute pain   ? Pain Onset More than a month ago   ? ?   ?  ? ?  ? ? ? ? ? ? ? ? ? ? ? ? ? ? ? ? ? ? ? ? [place] Adult PT Treatment/Exercise - 03/29/21 0001   ? ?  ? Exercises  ? Exercises Neck;Shoulder   ?  ? Modalities  ? Modalities Electrical Stimulation;Ultrasound   ?  ? Electrical Stimulation  ? Electrical Stimulation Location Left cervical.   ? Electrical Stimulation Action IFC x15 mins   ? Electrical Stimulation Parameters 80-_0    ? Electrical Stimulation Goals Tone;Pain   ?  ? Ultrasound  ? Ultrasound Location LT side cerv. paras   ? Ultrasound Parameters Combo US/estim 1.5 w/cm2 x 8 mins   ?  ? Traction  ? Type of Traction Cervical   ? Min (lbs) 5   ? Max (lbs) 16   Tried 19 #s ,but was to much  ? Hold Time 99   ? Rest Time 5   ? Time 15   ?  ? Manual Therapy  ? Manual Therapy Soft tissue mobilization;Joint mobilization   ? Soft tissue mobilization STW LT side paras   ? ?  ?  ? ?  ? ? ? ? ? ? ? ? ? ? ? ? ? ? ?  PT Long Term Goals - 03/29/21 1059   ? ?  ? PT LONG TERM GOAL #1  ? Title Independent with a HEP.   ? Baseline No knowledge of appropriate ther ex.   ? Time 4   ? Period Weeks   ? Status Partially Met   ?  ? PT LONG TERM GOAL #2  ? Title Increase active bilateral cervical rotation to 70 degrees+ so patient can turn head more easily while driving.   ? Baseline RT= 30 degrees and LT is 25 degrees.   ? Period Weeks   ? Status On-going   ?  ? PT LONG TERM GOAL #3  ? Title Eliminate UE symptoms.   ? Baseline Symptoms down left UE to first and second fingers of left hand.   ? Period Weeks   ? Status On-going   ?  ? PT LONG TERM GOAL #4  ? Title Sleep 6 hours undisturbed.   ? Baseline Patient can only sleep 4 hours.   ? Time 4   ? Period Weeks   ? Status On-going   ? ?  ?  ? ?  ? ? ? ? ? ? ? ? Plan - 03/29/21 1039   ? ? Clinical Impression Statement Pt arrived today doing about the same , but with some increased LT shldr soreness maybe due to the weather. Rx focused on decreasing pain with [place] combo STW, and traction at 16 #'s today.19#s was attempted, but  was  uncomfortable. Pt feels 50% better since starting PT and will f/u with MD next week.   ? Personal Factors and Comorbidities Comorbidity 1;Comorbidity 2;Other   ? Comorbidities HLD, HTN, "two small strokes", CAD, DM, MI, heart cath.   ? Stability/Clinical Decision Making Evolving/Moderate complexity   ? Rehab Potential Fair   ? PT Frequency 2x / week   ? PT Duration 4 weeks   ? PT Treatment/Interventions ADLs/Self Care Home Management;Cryotherapy;Electrical Stimulation;Ultrasound;Traction;Moist Heat;Therapeutic activities;Therapeutic exercise;Manual techniques;Patient/family education;Passive range of motion;Dry needling   ? PT Next Visit Plan light cervical AROM, chin tucks in supine, e-stim to left shoulder and upper extremity, cervical traction   ? PT Home Exercise Plan Patient currently trying active cervical rotation and extension per his referring physician.   ? Consulted and Agree with Plan of Care Patient   ? ?  ?  ? ?  ? ? ?Patient will benefit from skilled therapeutic intervention in order to improve the following deficits and impairments:  Decreased activity tolerance, Decreased range of motion, Decreased strength, Increased muscle spasms, Postural dysfunction, Pain ? ?Visit Diagnosis: ?Cervicalgia ? ?Abnormal posture ? ?Muscle weakness (generalized) ? ? ? ? ?Problem List ?Patient Active Problem List  ? Diagnosis Date Noted  ? Exertional chest pain 08/09/2020  ? COPD (chronic obstructive pulmonary disease) ([place]) 12/20/2019  ? Type 2 diabetes mellitus without complication ([place]) 38/87/1959  ? COPD GOLD 0/  group D  08/24/2019  ? Multiple pulmonary nodules determined by computed tomography of lung 08/24/2019  ? COPD exacerbation ([place])   ? COPD with acute exacerbation ([place]) 08/04/2019  ? Coronary artery disease   ? HLD (hyperlipidemia)   ? Leukocytosis   ? Hyperglycemia   ? Steroid-induced hyperglycemia   ? Acute respiratory failure with hypoxia ([place])   ? Morbid obesity due to excess calories ([place])   ?  Hypertensive crisis   ? Anxiety   ? Substance abuse ([place])   ? Cardiac arrest ([place]) 03/17/2019  ? 2019 novel coronavirus disease (COVID-19)  12/07/2018  ? Tobacco abuse 12/06/2018  ? ? ?Kincaid Tiger,CHRIS, PTA ?03/29/2021, 11:00 AM ? ?[place] ?[facility] ?[place] ?[place], [place], [address] ?Phone: [phone]   Fax:  [phone] ? ?Name: Robert Rich ?MRN: [id number] ?Date of Birth: 01-24-1966 ? ? ? ?

## 2021-04-03 ENCOUNTER — Other Ambulatory Visit: Payer: Self-pay

## 2021-04-03 ENCOUNTER — Ambulatory Visit: Payer: Medicaid Other

## 2021-04-03 DIAGNOSIS — R293 Abnormal posture: Secondary | ICD-10-CM

## 2021-04-03 DIAGNOSIS — M542 Cervicalgia: Secondary | ICD-10-CM

## 2021-04-03 DIAGNOSIS — M6281 Muscle weakness (generalized): Secondary | ICD-10-CM

## 2021-04-03 NOTE — Therapy (Signed)
[place] ?[facility] ?[place] ?[place], [place], [address] ?Phone: [phone]   Fax:  [phone] ? ?Physical Therapy Treatment ? ?Patient Details  ?Name: Robert Rich ?MRN: [id number] ?Date of Birth: [birth year] ?Referring Provider (PT): Esmond Plants MD ? ? ?Encounter Date: 04/03/2021 ? ? PT End of Session - 04/03/21 0956   ? ? Visit Number 7   ? Number of Visits 8   ? Date for PT Re-Evaluation 04/10/21   ? PT Start Time [phone]   ? PT Stop Time 1045   ? PT Time Calculation (min) 56 min   ? ?  ?  ? ?  ? ? ?Past Medical History:  ?Diagnosis Date  ? COPD (chronic obstructive pulmonary disease) ([place])   ? Coronary artery disease   ? DM (diabetes mellitus) ([place])   ? Heart attack ([place])   ? HLD (hyperlipidemia)   ? HTN (hypertension)   ? Obesity   ? Stroke [facility])   ? ? ?Past Surgical History:  ?Procedure Laterality Date  ? INTRAVASCULAR PRESSURE WIRE/FFR STUDY N/A 03/18/2019  ? Procedure: INTRAVASCULAR PRESSURE WIRE/FFR STUDY;  Surgeon: Adrian Prows, MD;  Location: [place] [facility];  Service: Cardiovascular;  Laterality: N/A;  ? LEFT HEART CATH AND CORONARY ANGIOGRAPHY N/A 03/18/2019  ? Procedure: LEFT HEART CATH AND CORONARY ANGIOGRAPHY;  Surgeon: Adrian Prows, MD;  Location: [place] [facility];  Service: Cardiovascular;  Laterality: N/A;  ? NO PAST SURGERIES    ? ? ?There were no vitals filed for this visit. ? ? Subjective Assessment - 04/03/21 0954   ? ? Subjective Pt arrives for today's treatment session reporting 10/10 neck and left arm pain.  Pt requests to begin with traction today. States exercises make pain worse.   ? Pertinent History HLD, HTN, "two small strokes", CAD, DM, MI, heart cath.   ? How long can you sit comfortably? 15 minutes.   ? 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   ? Currently in Pain? Yes   ? Pain Score 10-Worst pain ever   ? Pain Location Neck   ? Pain Orientation Left   ? Pain Onset More  than a month ago   ? ?  ?  ? ?  ? ? ? ? ? ? ? ? ? ? ? ? ? ? ? ? ? ? ? ? [place] Adult PT Treatment/Exercise - 04/03/21 0001   ? ?  ? Modalities  ? Modalities Electrical Stimulation;Traction   ?  ? Electrical Stimulation  ? Electrical Stimulation Location Left cervical.   ? Electrical Stimulation Action IFC x 15 mins   ? Electrical Stimulation Parameters 80-150 Hz   ? Electrical Stimulation Goals Tone;Pain   ?  ? Traction  ? Type of Traction Cervical   ? Min (lbs) 5   ? Max (lbs) 17   ? Hold Time 99   ? Rest Time 5   ? Time 15   ?  ? Manual Therapy  ? Manual Therapy Soft tissue mobilization   ? Soft tissue mobilization STW/M to left paraspinals and upper trap   ? ?  ?  ? ?  ? ? ? ? ? ? ? ? ? ? ? ? ? ? ? PT Long Term Goals - 03/29/21 1059   ? ?  ? PT LONG TERM GOAL #1  ? Title Independent with a HEP.   ? Baseline No knowledge of appropriate  ther ex.   ? Time 4   ? Period Weeks   ? Status Partially Met   ?  ? PT LONG TERM GOAL #2  ? Title Increase active bilateral cervical rotation to 70 degrees+ so patient can turn head more easily while driving.   ? Baseline RT= 30 degrees and LT is 25 degrees.   ? Period Weeks   ? Status On-going   ?  ? PT LONG TERM GOAL #3  ? Title Eliminate UE symptoms.   ? Baseline Symptoms down left UE to first and second fingers of left hand.   ? Period Weeks   ? Status On-going   ?  ? PT LONG TERM GOAL #4  ? Title Sleep 6 hours undisturbed.   ? Baseline Patient can only sleep 4 hours.   ? Time 4   ? Period Weeks   ? Status On-going   ? ?  ?  ? ?  ? ? ? ? ? ? ? ? Plan - 04/03/21 0956   ? ? Clinical Impression Statement Pt arrives for today's treatment session reporting 10/10 neck and left arm pain.  Pt states that ran out of muscle relaxers two weeks ago and his pain has increased since then.  Pt request to begin treatment with traction today, with max increased to 17#, tolerated well.  STW/M performed to left parapsinals and left upper trap to decrease pain and tone with marked tone througout  left upper trap.  Normal responses to estim noted upon removal.  Pt opted to not do ultrasound during today's treatment session. Pt reported 7/10 neck and left arm pain at completion of today's treatment session.   ? Personal Factors and Comorbidities Comorbidity 1;Comorbidity 2;Other   ? Comorbidities HLD, HTN, "two small strokes", CAD, DM, MI, heart cath.   ? Stability/Clinical Decision Making Evolving/Moderate complexity   ? Rehab Potential Fair   ? PT Frequency 2x / week   ? PT Duration 4 weeks   ? PT Treatment/Interventions ADLs/Self Care Home Management;Cryotherapy;Electrical Stimulation;Ultrasound;Traction;Moist Heat;Therapeutic activities;Therapeutic exercise;Manual techniques;Patient/family education;Passive range of motion;Dry needling   ? PT Next Visit Plan light cervical AROM, chin tucks in supine, e-stim to left shoulder and upper extremity, cervical traction   ? PT Home Exercise Plan Patient currently trying active cervical rotation and extension per his referring physician.   ? Consulted and Agree with Plan of Care Patient   ? ?  ?  ? ?  ? ? ?Patient will benefit from skilled therapeutic intervention in order to improve the following deficits and impairments:  Decreased activity tolerance, Decreased range of motion, Decreased strength, Increased muscle spasms, Postural dysfunction, Pain ? ?Visit Diagnosis: ?Cervicalgia ? ?Abnormal posture ? ?Muscle weakness (generalized) ? ? ? ? ?Problem List ?Patient Active Problem List  ? Diagnosis Date Noted  ? Exertional chest pain 08/09/2020  ? COPD (chronic obstructive pulmonary disease) ([place]) 12/20/2019  ? Type 2 diabetes mellitus without complication ([place]) 78/67/6720  ? COPD GOLD 0/  group D  08/24/2019  ? Multiple pulmonary nodules determined by computed tomography of lung 08/24/2019  ? COPD exacerbation ([place])   ? COPD with acute exacerbation ([place]) 08/04/2019  ? Coronary artery disease   ? HLD (hyperlipidemia)   ? Leukocytosis   ? Hyperglycemia   ?  Steroid-induced hyperglycemia   ? Acute respiratory failure with hypoxia ([place])   ? Morbid obesity due to excess calories ([place])   ? Hypertensive crisis   ? Anxiety   ? Substance abuse (  [place])   ? Cardiac arrest ([place]) 03/17/2019  ? 2019 novel coronavirus disease (COVID-19) 12/07/2018  ? Tobacco abuse 12/06/2018  ? ? ?Kathrynn Ducking, PTA ?04/03/2021, 11:14 AM ? ?[place] ?[facility] ?[place] ?[place], [place], [address] ?Phone: [phone]   Fax:  [phone] ? ?Name: Robert Rich ?MRN: [id number] ?Date of Birth: Nov 07, 1965 ? ? ? ?

## 2021-04-05 ENCOUNTER — Encounter: Payer: Medicaid Other | Admitting: Physical Therapy

## 2021-04-10 ENCOUNTER — Ambulatory Visit: Payer: Medicaid Other | Admitting: Physical Therapy

## 2021-04-10 ENCOUNTER — Other Ambulatory Visit: Payer: Self-pay

## 2021-04-10 DIAGNOSIS — R293 Abnormal posture: Secondary | ICD-10-CM

## 2021-04-10 DIAGNOSIS — M6281 Muscle weakness (generalized): Secondary | ICD-10-CM

## 2021-04-10 DIAGNOSIS — M542 Cervicalgia: Secondary | ICD-10-CM

## 2021-04-10 NOTE — Patient Instructions (Signed)
PROGHROAMME EXERCISE PROGRAM ?Created by [place] Leylanie Woodmansee Mar 15th, 2023 ?View at www.my-exercise-code.com using code: [id number] ?Total 2 Page 1 of 1 ?Shoulder Flexion Wall Stretch ?Standing in front of the wall, walk your fingers up the wall until your ?arm is raised above your hand and you feel a slight stretch in the ?shoulder. ?Hold 2 Minutes ?Complete 2 Sets ?PECTORALIS CORNER STRETCH ?While standing at a corner of a wall, place your arms on the walls ?with elobws bent so that your upper arms are horizontal and your ?forearms are directed upwards as shown. Take one step forward ?towards the corner. Bend your front knee until a stretch is felt ?along the front of your chest and/or shoulders. Your arms should ?be pointed downward towards the ground. ?NOTE: Your legs should control the stretch by bending or ?straightening your front knee. ?Repeat 4 Times Hold 30 Seconds ?Complete 1 Set Perform 3 Times a Day ?

## 2021-04-10 NOTE — Therapy (Signed)
[place] ?[facility] ?[address] ?[place], [place], [address] ?Phone: [phone]   Fax:  [phone] ? ?Physical Therapy Treatment ? ?Patient Details  ?Name: Robert Rich ?MRN: [id number] ?Date of Birth: 1965-06-10 ?Referring Provider (PT): Esmond Plants MD ? ? ?Encounter Date: 04/10/2021 ? ? PT End of Session - 04/10/21 1008   ? ? Visit Number 8   ? Number of Visits 8   ? Date for PT Re-Evaluation 04/10/21   ? PT Start Time [phone]   ? PT Stop Time 2549   ? PT Time Calculation (min) 27 min   ? Activity Tolerance Patient tolerated treatment well   ? Behavior During Therapy [facility] for tasks assessed/performed   ? ?  ?  ? ?  ? ? ?Past Medical History:  ?Diagnosis Date  ? COPD (chronic obstructive pulmonary disease) ([place])   ? Coronary artery disease   ? DM (diabetes mellitus) ([place])   ? Heart attack ([place])   ? HLD (hyperlipidemia)   ? HTN (hypertension)   ? Obesity   ? Stroke [facility])   ? ? ?Past Surgical History:  ?Procedure Laterality Date  ? INTRAVASCULAR PRESSURE WIRE/FFR STUDY N/A 03/18/2019  ? Procedure: INTRAVASCULAR PRESSURE WIRE/FFR STUDY;  Surgeon: Adrian Prows, MD;  Location: [place] [facility];  Service: Cardiovascular;  Laterality: N/A;  ? LEFT HEART CATH AND CORONARY ANGIOGRAPHY N/A 03/18/2019  ? Procedure: LEFT HEART CATH AND CORONARY ANGIOGRAPHY;  Surgeon: Adrian Prows, MD;  Location: [place] [facility];  Service: Cardiovascular;  Laterality: N/A;  ? NO PAST SURGERIES    ? ? ?There were no vitals filed for this visit. ? ? Subjective Assessment - 04/10/21 1009   ? ? Subjective COVID-19 screen performed prior to patient entering clinic.  Patient presents to the clinic today with 10/10 pain and symptoms into left UE.  He states that really only traction is helping and he only gets about 3-4 hours of pain relief.   ? Pertinent History HLD, HTN, "two small strokes", CAD, DM, MI, heart cath.   ? How long can you sit comfortably? 15 minutes.   ? 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   ? Patient Stated Goals Would love to get out of pain and do things like he was able before.   ? Pain Score 10-Worst pain ever   ? Pain Location Neck   ? Pain Orientation Left   ? Pain Descriptors / Indicators Aching;Sore   ? Pain Type Acute pain   ? Pain Onset More than a month ago   ? ?  ?  ? ?  ? ? ? ? ? ? ? ? ? ? ? ? ? ? ? ? ? ? ? ? [place] Adult PT Treatment/Exercise - 04/10/21 0001   ? ?  ? Traction  ? Type of Traction Cervical   ? Min (lbs) 5   ? Max (lbs) 17   ? Hold Time 99   ? Rest Time 5   ? Time 15   ? ?  ?  ? ?  ? ? ? ? ? ? ? ? ? ? ? ? ? ? ? PT Long Term Goals - 03/29/21 1059   ? ?  ? PT LONG TERM GOAL #1  ? Title Independent with a HEP.   ? Baseline No knowledge of appropriate ther ex.   ? Time 4   ? Period Weeks   ?  Status Partially Met   ?  ? PT LONG TERM GOAL #2  ? Title Increase active bilateral cervical rotation to 70 degrees+ so patient can turn head more easily while driving.   ? Baseline RT= 30 degrees and LT is 25 degrees.   ? Period Weeks   ? Status On-going   ?  ? PT LONG TERM GOAL #3  ? Title Eliminate UE symptoms.   ? Baseline Symptoms down left UE to first and second fingers of left hand.   ? Period Weeks   ? Status On-going   ?  ? PT LONG TERM GOAL #4  ? Title Sleep 6 hours undisturbed.   ? Baseline Patient can only sleep 4 hours.   ? Time 4   ? Period Weeks   ? Status On-going   ? ?  ?  ? ?  ? ? ? ? ? ? ? ? Plan - 04/10/21 1013   ? ? Clinical Impression Statement Patient with continued severe pain in neck and left UE symptoms.  He has responded well to intermittment cervical traction but only receives 3-4 hours of relief.  Added wall climbs and corner stretch to his HEP.  Patient seeing surgeon next week.  Will hold treatments at this time.   ? Personal Factors and Comorbidities Comorbidity 1;Comorbidity 2;Other   ? Comorbidities HLD, HTN, "two small strokes", CAD, DM, MI, heart cath.   ? Examination-Activity  Limitations Sleep;Other   ? Examination-Participation Restrictions Other   ? Rehab Potential Fair   ? PT Frequency 2x / week   ? PT Duration 4 weeks   ? PT Treatment/Interventions ADLs/Self Care Home Management;Cryotherapy;Electrical Stimulation;Ultrasound;Traction;Moist Heat;Therapeutic activities;Therapeutic exercise;Manual techniques;Patient/family education;Passive range of motion;Dry needling   ? PT Next Visit Plan Hold treatment at this time.   ? Consulted and Agree with Plan of Care Patient   ? ?  ?  ? ?  ? ? ?Patient will benefit from skilled therapeutic intervention in order to improve the following deficits and impairments:  Decreased activity tolerance, Decreased range of motion, Decreased strength, Increased muscle spasms, Postural dysfunction, Pain ? ?Visit Diagnosis: ?Cervicalgia ? ?Abnormal posture ? ?Muscle weakness (generalized) ? ? ? ? ?Problem List ?Patient Active Problem List  ? Diagnosis Date Noted  ? Exertional chest pain 08/09/2020  ? COPD (chronic obstructive pulmonary disease) ([place]) 12/20/2019  ? Type 2 diabetes mellitus without complication ([place]) 64/68/0321  ? COPD GOLD 0/  group D  08/24/2019  ? Multiple pulmonary nodules determined by computed tomography of lung 08/24/2019  ? COPD exacerbation ([place])   ? COPD with acute exacerbation ([place]) 08/04/2019  ? Coronary artery disease   ? HLD (hyperlipidemia)   ? Leukocytosis   ? Hyperglycemia   ? Steroid-induced hyperglycemia   ? Acute respiratory failure with hypoxia ([place])   ? Morbid obesity due to excess calories ([place])   ? Hypertensive crisis   ? Anxiety   ? Substance abuse ([place])   ? Cardiac arrest ([place]) 03/17/2019  ? 2019 novel coronavirus disease (COVID-19) 12/07/2018  ? Tobacco abuse 12/06/2018  ? ? ?Marsi Turvey, [place], PT ?04/10/2021, 10:16 AM ? ?[place] ?[facility] ?[place] ?[place], [place], [address] ?Phone: [phone]   Fax:  [phone] ? ?Name: Robert Rich ?MRN: [id number] ?Date of Birth:  1965/10/08 ? ? ? ?

## 2021-04-12 ENCOUNTER — Encounter: Payer: Medicaid Other | Admitting: Physical Therapy

## 2021-04-16 ENCOUNTER — Ambulatory Visit: Payer: Medicaid Other | Admitting: Student

## 2021-04-16 ENCOUNTER — Encounter: Payer: Self-pay | Admitting: Student

## 2021-04-16 ENCOUNTER — Telehealth: Payer: Self-pay | Admitting: Internal Medicine

## 2021-04-16 ENCOUNTER — Other Ambulatory Visit: Payer: Self-pay

## 2021-04-16 VITALS — BP 137/83 | HR 88 | Temp 98.3°F | Resp 17 | Ht 69.0 in | Wt 239.2 lb

## 2021-04-16 DIAGNOSIS — I10 Essential (primary) hypertension: Secondary | ICD-10-CM

## 2021-04-16 DIAGNOSIS — R0609 Other forms of dyspnea: Secondary | ICD-10-CM

## 2021-04-16 DIAGNOSIS — I2510 Atherosclerotic heart disease of native coronary artery without angina pectoris: Secondary | ICD-10-CM

## 2021-04-16 NOTE — Telephone Encounter (Signed)
Per verbal Dr. Sherene Sires, patient needs to been seen for this surgery clearance.  ?Robert Rich has called and notified patient.  ?Nothing further needed.  ?

## 2021-04-16 NOTE — Progress Notes (Signed)
? ?Primary Physician/Referring:  Rebecka Apley, NP ? ?Patient ID: Robert Rich, male    DOB: 19-Mar-1965, 56 y.o.   MRN: [id number] ? ?Chief Complaint  ?Patient presents with  ? Follow-up  ? ?HPI:   ? ?Robert Rich  is a 56 y.o. Caucasian male with hypertension, hyperlipidemia, uncontrolled DM, obesity, CAD with coronary angiography revealing ostial LAD 50% stenosis in 02/2019, severe COPD, vertebral artery dissection (2016).  Patient was admitted 02/2019 following cardiac arrest which was felt to be related to respiratory failure with underlying severe COPD.  Patient notably quit smoking and drinking alcohol and 02/2019. ? ?Patient was last seen in our office 04/18/2019 which time he was advised to follow-up as needed given that he is followed closely by pulmonology for management of severe COPD.  Patient now presents for reevaluation and preoperative risk stratification prior to upcoming cervical spine fusion surgery.  Patient does have dyspnea on exertion which is stable and chronic, however his activity is severely limited due to this.  He also reports occasional episodes of lightheadedness, particularly when standing quickly only lasting several seconds.  Patient has no formal exercise routine.  No strenuous activity lately patient reports as trying to push his lawnmower, however he had to stop this only after a brief effort given shortness of breath.  Denies chest pain, orthopnea, leg edema, syncope, near syncope. ? ?Past Medical History:  ?Diagnosis Date  ? COPD (chronic obstructive pulmonary disease) (HCC)   ? Coronary artery disease   ? DM (diabetes mellitus) (HCC)   ? Heart attack (HCC)   ? HLD (hyperlipidemia)   ? HTN (hypertension)   ? Obesity   ? Stroke [facility])   ? ?Past Surgical History:  ?Procedure Laterality Date  ? INTRAVASCULAR PRESSURE WIRE/FFR STUDY N/A 03/18/2019  ? Procedure: INTRAVASCULAR PRESSURE WIRE/FFR STUDY;  Surgeon: [name], MD;  Location: [facility] INVASIVE CV [facility];  Service:  Cardiovascular;  Laterality: N/A;  ? LEFT HEART CATH AND CORONARY ANGIOGRAPHY N/A 03/18/2019  ? Procedure: LEFT HEART CATH AND CORONARY ANGIOGRAPHY;  Surgeon: [name], MD;  Location: [facility] INVASIVE CV LAB;  Service: Cardiovascular;  Laterality: N/A;  ? NO PAST SURGERIES    ? ?Family History  ?Problem Relation Age of Onset  ? Heart disease Mother   ? Heart failure Mother   ? Atrial fibrillation Mother   ? Kidney disease Father   ?  ?Social History  ? ?Tobacco Use  ? Smoking status: Former  ?  Packs/day: 0.10  ?  Years: 20.00  ?  Pack years: 2.00  ?  Types: Cigarettes  ?  Quit date: 03/17/2019  ?  Years since quitting: 2.0  ? Smokeless tobacco: Never  ? Tobacco comments:  ?  Quit in feb of 2021  ?Substance Use Topics  ? Alcohol use: Not Currently  ?  Alcohol/week: 12.0 standard drinks  ?  Types: 12 Cans of beer per week  ?  Comment: Quit in Feb 2021  ? ?ROS  ?Review of Systems  ?Constitutional: Negative for malaise/fatigue and weight gain.  ?Cardiovascular:  Negative for chest pain, claudication, dyspnea on exertion, leg swelling, near-syncope, orthopnea, palpitations, paroxysmal nocturnal dyspnea and syncope.  ?Respiratory:  Positive for cough (chronic) and wheezing (chronic).   ?Neurological:  Negative for dizziness.  ? ?Objective  ?Blood pressure 137/83, pulse 88, temperature 98.3 ?F (36.8 ?C), temperature source Temporal, resp. rate 17, height 5\' 9"  (1.753 m), weight 239 lb 3.2 oz (108.5 kg), SpO2 98 %.  ? ?  04/16/2021  ?  2:49 PM 04/16/2021  ?  2:37 PM 03/20/2021  ?  1:55 AM  ?Vitals with BMI  ?Height  5\' 9"    ?Weight  239 lbs 3 oz   ?BMI  35.31   ?Systolic 137 163 161135  ?Diastolic 83 89 78  ?Pulse 88 83 88  ?  ? Physical Exam ?Vitals reviewed.  ?Constitutional:   ?   Appearance: He is obese.  ?Cardiovascular:  ?   Rate and Rhythm: Normal rate and regular rhythm.  ?   Pulses: Intact distal pulses.  ?   Heart sounds: Normal heart sounds. No murmur heard. ?  No gallop.  ?   Comments: No JVD. ?Pulmonary:  ?   Effort:  Pulmonary effort is normal.  ?   Breath sounds: Wheezing (diffuse scattered) and rales (diffuse scattered) present.  ?Musculoskeletal:  ?   Right lower leg: No edema.  ?   Left lower leg: No edema.  ? ?Laboratory examination:  ? ?No results for input(s): NA, K, CL, CO2, GLUCOSE, BUN, CREATININE, CALCIUM, GFRNONAA, GFRAA in the last 8760 hours. ? ?CrCl cannot be calculated (Patient's most recent lab result is older than the maximum 21 days allowed.).  ? ?  Latest Ref Rng & Units 12/22/2019  ? 11:31 PM 12/22/2019  ?  6:47 AM 12/21/2019  ?  4:41 AM  ?CMP  ?Glucose 70 - 99 mg/dL 096445   045258   409241    ?BUN 6 - 20 mg/dL  23   16    ?Creatinine 0.61 - 1.24 mg/dL  8.110.69   9.140.63    ?Sodium 135 - 145 mmol/L  136   136    ?Potassium 3.5 - 5.1 mmol/L  4.3   5.2    ?Chloride 98 - 111 mmol/L  97   99    ?CO2 22 - 32 mmol/L  27   25    ?Calcium 8.9 - 10.3 mg/dL  9.1   9.2    ?Total Protein 6.5 - 8.1 g/dL   8.4    ?Total Bilirubin 0.3 - 1.2 mg/dL   0.3    ?Alkaline Phos 38 - 126 U/L   55    ?AST 15 - 41 U/L   17    ?ALT 0 - 44 U/L   20    ? ? ?  Latest Ref Rng & Units 12/24/2019  ?  8:36 AM 12/22/2019  ?  6:47 AM 12/21/2019  ?  4:41 AM  ?CBC  ?WBC 4.0 - 10.5 K/uL 12.8   15.0   17.5    ?Hemoglobin 13.0 - 17.0 g/dL 78.213.9   95.612.9   21.313.6    ?Hematocrit 39.0 - 52.0 % 43.2   40.1   42.4    ?Platelets 150 - 400 K/uL 485   449   479    ? ?Lipid Panel  ?   ?Component Value Date/Time  ? CHOL 154 12/21/2019 0441  ? TRIG 83 12/21/2019 0441  ? HDL 54 12/21/2019 0441  ? CHOLHDL 2.9 12/21/2019 0441  ? VLDL 17 12/21/2019 0441  ? LDLCALC 83 12/21/2019 0441  ? ?HEMOGLOBIN A1C ?Lab Results  ?Component Value Date  ? HGBA1C 7.2 (H) 12/20/2019  ? MPG 159.94 12/20/2019  ? ?TSH ?No results for input(s): TSH in the last 8760 hours. ? ?Allergies  ? ?Allergies  ?Allergen Reactions  ? Asa [Aspirin] Hives  ? Zolmitriptan Nausea And Vomiting and Other (See Comments)  ?  Severe headaches  ?  ?  Medications Prior to Visit:  ? ?Outpatient Medications Prior to Visit   ?Medication Sig Dispense Refill  ? albuterol (PROVENTIL) (2.5 MG/3ML) 0.083% nebulizer solution Take 3 mLs (2.5 mg total) by nebulization every 4 (four) hours as needed for wheezing or shortness of breath. 75 mL 12  ? ALBUTEROL IN Inhale 2 puffs into the lungs every 4 (four) hours as needed.    ? ANORO ELLIPTA 62.5-25 MCG/ACT AEPB Inhale 1 puff into the lungs daily.    ? atorvastatin (LIPITOR) 40 MG tablet Take 1 tablet (40 mg total) by mouth daily at 6 PM. 90 tablet 1  ? butalbital-acetaminophen-caffeine (FIORICET) 50-325-40 MG tablet Take 1 tablet by mouth every 6 (six) hours as needed for migraine. 20 tablet 0  ? clopidogrel (PLAVIX) 75 MG tablet Take 1 tablet (75 mg total) by mouth daily. 30 tablet 2  ? cyclobenzaprine (FLEXERIL) 10 MG tablet Take 1 tablet by mouth as needed.    ? FARXIGA 10 MG TABS tablet Take 10 mg by mouth daily.    ? folic acid (FOLVITE) 1 MG tablet Take 1 tablet (1 mg total) by mouth daily. 30 tablet 2  ? insulin NPH-regular Human (NOVOLIN 70/30) (70-30) 100 UNIT/ML injection Inject 50 Units into the skin 2 (two) times daily with a meal. 10 mL 1  ? ipratropium-albuterol (DUONEB) 0.5-2.5 (3) MG/3ML SOLN Inhale 3 mLs into the lungs daily.    ? losartan (COZAAR) 50 MG tablet Take 50 mg by mouth daily.    ? metFORMIN (GLUCOPHAGE) 1000 MG tablet Take 1 tablet (1,000 mg total) by mouth 2 (two) times daily with a meal. 60 tablet 2  ? metoprolol succinate (TOPROL-XL) 25 MG 24 hr tablet Take 1 tablet (25 mg total) by mouth daily. 30 tablet 2  ? montelukast (SINGULAIR) 10 MG tablet Take 1 tablet (10 mg total) by mouth at bedtime. 30 tablet 1  ? montelukast (SINGULAIR) 10 MG tablet Take 1 tablet by mouth at bedtime as needed.    ? Multiple Vitamin (MULTIVITAMIN WITH MINERALS) TABS tablet Take 1 tablet by mouth daily. 30 tablet 2  ? pregabalin (LYRICA) 75 MG capsule Take 75 mg by mouth 3 (three) times daily.    ? thiamine 100 MG tablet Take 2.5 tablets (250 mg total) by mouth daily. (Patient taking  differently: Take 100 mg by mouth daily.) 30 tablet 2  ? losartan (COZAAR) 25 MG tablet Take 1 tablet (25 mg total) by mouth daily. 30 tablet 2  ? rosuvastatin (CRESTOR) 20 MG tablet Take 20 mg by mouth at bedtime.    ? ?No

## 2021-04-16 NOTE — Patient Instructions (Signed)
Cardiac Nuclear Scan ?A cardiac nuclear scan is a test that is done to check the flow of blood to your heart. It is done when you are resting and when you are exercising. The test looks for problems such as: ?Not enough blood reaching a portion of the heart. ?The heart muscle not working as it should. ?You may need this test if: ?You have heart disease. ?You have had lab results that are not normal. ?You have had heart surgery or a balloon procedure to open up blocked arteries (angioplasty). ?You have chest pain. ?You have shortness of breath. ?In this test, a special dye (tracer) is put into your bloodstream. The tracer will travel to your heart. A camera will then take pictures of your heart to see how the tracer moves through your heart. This test is usually done at a hospital and takes 2-4 hours. ?Tell a doctor about: ?Any allergies you have. ?All medicines you are taking, including vitamins, herbs, eye drops, creams, and over-the-counter medicines. ?Any problems you or family members have had with anesthetic medicines. ?Any blood disorders you have. ?Any surgeries you have had. ?Any medical conditions you have. ?Whether you are pregnant or may be pregnant. ?What are the risks? ?Generally, this is a safe test. However, problems may occur, such as: ?Serious chest pain and heart attack. This is only a risk if the stress portion of the test is done. ?Rapid heartbeat. ?A feeling of warmth in your chest. This feeling usually does not last long. ?Allergic reaction to the tracer. ?What happens before the test? ?Ask your doctor about changing or stopping your normal medicines. This is important. ?Follow instructions from your doctor about what you cannot eat or drink. ?Remove your jewelry on the day of the test. ?What happens during the test? ?An IV tube will be inserted into one of your veins. ?Your doctor will give you a small amount of tracer through the IV tube. ?You will wait for 20-40 minutes while the tracer  moves through your bloodstream. ?Your heart will be monitored with an electrocardiogram (ECG). ?You will lie down on an exam table. ?Pictures of your heart will be taken for about 15-20 minutes. ?You may also have a stress test. For this test, one of these things may be done: ?You will be asked to exercise on a treadmill or a stationary bike. ?You will be given medicines that will make your heart work harder. This is done if you are unable to exercise. ?When blood flow to your heart has peaked, a tracer will again be given through the IV tube. ?After 20-40 minutes, you will get back on the exam table. More pictures will be taken of your heart. ?Depending on the tracer that is used, more pictures may need to be taken 3-4 hours later. ?Your IV tube will be removed when the test is over. ?The test may vary among doctors and hospitals. ?What happens after the test? ?Ask your doctor: ?Whether you can return to your normal schedule, including diet, activities, and medicines. ?Whether you should drink more fluids. This will help to remove the tracer from your body. Drink enough fluid to keep your pee (urine) pale yellow. ?Ask your doctor, or the department that is doing the test: ?When will my results be ready? ?How will I get my results? ?Summary ?A cardiac nuclear scan is a test that is done to check the flow of blood to your heart. ?Tell your doctor whether you are pregnant or may be pregnant. ?Before  the test, ask your doctor about changing or stopping your normal medicines. This is important. ?Ask your doctor whether you can return to your normal activities. You may be asked to drink more fluids. ?This information is not intended to replace advice given to you by your health care provider. Make sure you discuss any questions you have with your health care provider. ?Document Revised: 09/26/2020 Document Reviewed: 06/27/2020 ?Elsevier Patient Education ? 2022 [employer] ? ?

## 2021-04-17 ENCOUNTER — Ambulatory Visit (INDEPENDENT_AMBULATORY_CARE_PROVIDER_SITE_OTHER): Payer: Medicaid Other | Admitting: Internal Medicine

## 2021-04-17 ENCOUNTER — Encounter: Payer: Self-pay | Admitting: Internal Medicine

## 2021-04-17 ENCOUNTER — Other Ambulatory Visit: Payer: Self-pay

## 2021-04-17 DIAGNOSIS — J449 Chronic obstructive pulmonary disease, unspecified: Secondary | ICD-10-CM | POA: Diagnosis not present

## 2021-04-17 NOTE — Patient Instructions (Signed)
You are cleared for neck surgery  ? ?I'll be referring you for lung cancer screening  ? ?Pulmonary follow up is as needed for refills  ?

## 2021-04-17 NOTE — Progress Notes (Signed)
? ?Robert Rich, male    DOB: 1965/10/10     MRN: [id number] ? ? 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 ?Admit date: 08/04/2019 ?Discharge date: 08/11/2019 ?  ?Brief/Interim Summary: ?56 y.o. male former smoker with medical history significant for severe COPD, coronary artery disease, type 2 diabetes mellitus, dyslipidemia, alcohol use disorder and substance abuse with benzodiazepines and history of prior cocaine use was recently hospitalized discharged 2 weeks ago for a COPD exacerbation.  He reports that he felt well after going home but when he ran out of steroids and antibiotics his symptoms slowly began to return over the course of the last 3 days.  He became so severely short of breath today that he called EMS.  He says that he had return to work last week and was working short hours up until 3 days ago when he had increased his work hours and noticed that his shortness of breath became acutely worse.  He reports significant wheezing and increased work of breathing coughing and chest discomfort.  He denies chest pain.  He reports productive cough with yellow sputum.  He can only walk a few feet without severe shortness of breath. ?  ?Discharge Diagnoses:  ?  ?Acute on chronic respiratory failure with hypoxia  ?-secondary to severe COPD exacerbation  ?--symptoms rapidly returned after he completed his steroid taper and antibiotics from recent hospitalization. ?-He has an outpatient pulmonary appt later this month but I requested inpatient consult given multiple admissions and ED visits.  ?-appreciate pulmonary follow up-->wean prednisone over 2 weeks ?  ?COPD Exacerbation ?-continue duonebs ?-de-escalate to po prednisone ? - Appreciate pulmonary consult.  Discussed with Dr. Merdis Delay prednisone over next 2 weeks    ?-Unfortunately patient could not tolerate brovana neb treatments.   ?-Continue Duonebs, continue pulmicort nebs  ?-Pulmonary willl see him again on outpatient follow up 08/24/19.   ?-They will arrange PFTs.  ?- He was given lasix 20 mg IV daily x 4 total doses during the hospitalization.   ?  ?CAD  ?- stable, no chest pain symptoms, resumed his home heart medications.  ?  ?Tobacco abuse ?- He reports he quit several months ago.  ?Polysubstance abuse  ?- UDS positive for benzo, opioid and barbituates. ?  ?Anxiety ?- alprazolam as needed only for severe symptoms.  ?Type 2 DM with steroid induced hyperglycemia  ?- increased doses of insulin and follow closely especially while on IV steroids.    ?-Pt will need to DC home on insulin.  He has used insulin before.  He prefers vials and syringes.  He will be on steroids for at least 2 weeks after discharge and will need insulin plus metformin to help control his steroid induced hyperglycemia ?-d/c home with novolin 70/30--50 units bid ?-instructed pt to continue checking CBGs ac/hs ? ? ? ? ? overall 50% improved at d/c on prednisone tapered off 08/20/19 and about the same as at d/c but no longer using neb / maint on Anoro  ? ? ? ? ?History of Present Illness  ?08/24/2019  Pulmonary/ 1st office eval/ Robert Rich / [facility]  ?Chief Complaint  ?Patient presents with  ? Follow-up  ?  shortness of breath with exertion  ?Dyspnea:  MMRC3 =  can't walk 100 yards even at a slow pace at a flat grade s stopping due to sob   ?Cough: resolved  ?Sleep: flat  ?SABA use: combivent and anoro both in am then no other saba  ?rec ?Plan A = Automatic = Always=   Anoro one click take two drags ?Plan B = Backup (to supplement plan A, not to replace it) ?Only use your albuterol inhaler as a rescue medication ?Try albuterol 15 min before an activity that you know would make you short of breath  ?   Plan C = Crisis (instead of Plan B but only if Plan B stops working) ?- only use your albuterol  nebulizer if you first try Plan B  ?Make sure you check your oxygen saturations at highest level of activity  ?Please schedule a follow up visit in 3 months with PFTs  but call sooner if needed  - ok to delay if you haven't got your insurance.  ? ?  ? ? ?04/30/2020  f/u ov/Robert Rich office/Robert Rich re: GOLD 0 copd/AB ?Chief Complaint  ?Patient presents with  ? Follow-up  ?  Breathing has not improved since his last visit. He has had prod cough with brown sputum for the past 5 wks. He is using his albuterol inhaler 7 x per on average and rarely uses neb.   ?Dyspnea: MMRC2 = can't walk a nl pace on a flat grade s sob but does fine slow and flat eg ?Cough: brown esp in am x 5 weeks ?Sleeping: poorly due to cough ?SABA use: way too much  ?02: none ?Covid status: x 2 last 09/26/19  ?Lung cancer screening: won't be due until 03/2021 ?rec ?Plan A = Automatic = Always=    Stop anoro and start breztri Take 2 puffs first thing in am and then another 2 puffs about 12 hours later.  ?Work on inhaler technique:  ?Plan B = Backup (to supplement plan A, not to replace it) ?Only use your albuterol inhaler as a rescue medication  ?Plan C = Crisis (instead of Plan B but only if Plan B stops working) ?- only use your albuterol nebulizer if you first try Plan B  ?zpak  ? ? ? ?04/17/2021  f/u ov/Robert Rich office/Robert Rich re: copd 0/AB maint on Anoro  daily   ?Chief Complaint  ?Patient presents with  ? Follow-up  ?  Surgery clearance  ?Patient is going to fax over updated med list  ?  ? Dyspnea:  walking track 100 yards and has to stop only when  at rapid walk but can do 200 yards slow pace fine  ?Cough: sputter a bit in am / min mucoid resolves  p 20 min  ?Sleeping: 45 degrees  ?SABA use: none now  ?02: none  ?Covid status: vax x 3  ?Lung cancer screening: done 04/09/20  ? ? ?No obvious day to day or daytime variability or assoc excess/ purulent sputum or mucus plugs or hemoptysis or cp or chest tightness, subjective wheeze or overt sinus or hb  symptoms.  ? ?Sleeping as above without nocturnal   exacerbation  of respiratory  c/o's or need for noct saba. Also denies any obvious fluctuation of symptoms with weather or environmental changes or other aggravating or alleviating factors except as outlined above  ? ?No unusual exposure hx or h/o childhood pna/ asthma or knowledge of premature birth. ? ?Current Allergies, Complete Past Medical History, Past Surgical History, Family History, and Social History were reviewed in [employer]  record. ? ?ROS  The following are not active complaints unless bolded ?Hoarseness, sore throat, dysphagia, dental problems, itching, sneezing,  nasal congestion or discharge of excess mucus or purulent secretions, ear ache,   fever, chills, sweats, unintended wt loss or wt gain, classically pleuritic or exertional cp,  orthopnea pnd or arm/hand swelling  or leg swelling, presyncope, palpitations, abdominal pain, anorexia, nausea, vomiting, diarrhea  or change in bowel habits or change in bladder habits, change in stools or change in urine, dysuria, hematuria,  rash, arthralgias, visual complaints, headache, numbness, weakness or ataxia or problems with walking or coordination,  change in mood or  memory. ?      ? ?Current Meds  ?Medication Sig  ? albuterol (PROVENTIL) (2.5 MG/3ML) 0.083% nebulizer solution Take 3 mLs (2.5 mg total) by nebulization every 4 (four) hours as needed for wheezing or shortness of breath.  ? ALBUTEROL IN Inhale 2 puffs into the lungs every 4 (four) hours as needed.  ? ANORO ELLIPTA 62.5-25 MCG/ACT AEPB Inhale 1 puff into the lungs daily.  ? atorvastatin (LIPITOR) 40 MG tablet Take 1 tablet (40 mg total) by mouth daily at 6 PM.  ? butalbital-acetaminophen-caffeine (FIORICET) 50-325-40 MG tablet Take 1 tablet by mouth every 6 (six) hours as needed for migraine.  ? clopidogrel (PLAVIX) 75 MG tablet Take 1 tablet (75 mg total) by mouth daily.  ? cyclobenzaprine (FLEXERIL) 10 MG tablet  Take 1 tablet by mouth as needed.  ? FARXIGA 10 MG TABS tablet Take 10 mg by mouth daily.  ? folic acid (FOLVITE) 1 MG tablet Take 1 tablet (1 mg total) by mouth daily.  ? insulin NPH-regular Human (NOVOLIN 70/30

## 2021-04-17 NOTE — Assessment & Plan Note (Signed)
Quit smoking 02/2019 at wt 260  ?-  Spirometry only on 11/01/19  No obst p anoro prior to study and minimal concavity to f/v loop  ?- 04/30/2020  After extensive coaching inhaler device,  effectiveness =    75% > try breztri 2bid > "too jittery" but flared on anoro  ?- 06/27/2020  After extensive coaching inhaler device,  effectiveness =    75% > rechallenge with breztri one bid  ?- 08/08/2020  Improved on anoro > continue  ? ?Despite progressive wt gain p quit smoking and became limted by neck pain, he has relatively well preserved ex tol and no contraindication for neck surgery. ? ?Given usual precautions for early mobilization and minimal anagesics with duoneb qid prn post op to substitute for anoro until can restart this post op  ? ?Low-dose CT lung cancer screening is recommended for patients who ?are 43-45 years of age with a 20+ pack-year history of smoking and ?who are currently smoking or quit <=15 years ago. ?No coughing up blood  ?No unintentional weight loss of > 15 pounds in the last 6 months  ?>>> referred for shared decision making.  ? ?Pulmonary f/u is prn  ? ? ?    ?  ? ?Each maintenance medication was reviewed in detail including emphasizing most importantly the difference between maintenance and prns and under what circumstances the prns are to be triggered using an action plan format where appropriate. ? ?Total time for H and P, chart review, counseling, reviewing dpi device(s) and generating customized AVS unique to this office visit / same day charting =34 min  ?     ? ? ?

## 2021-04-23 ENCOUNTER — Ambulatory Visit: Payer: Medicaid Other

## 2021-04-23 DIAGNOSIS — R0609 Other forms of dyspnea: Secondary | ICD-10-CM

## 2021-04-23 DIAGNOSIS — I2510 Atherosclerotic heart disease of native coronary artery without angina pectoris: Secondary | ICD-10-CM

## 2021-04-24 ENCOUNTER — Other Ambulatory Visit: Payer: Self-pay

## 2021-05-01 ENCOUNTER — Ambulatory Visit: Payer: Medicaid Other

## 2021-05-01 DIAGNOSIS — R0609 Other forms of dyspnea: Secondary | ICD-10-CM

## 2021-05-01 DIAGNOSIS — I2510 Atherosclerotic heart disease of native coronary artery without angina pectoris: Secondary | ICD-10-CM

## 2021-05-07 NOTE — Progress Notes (Signed)
? ?Primary Physician/Referring:  Rebecka Apley, NP ? ?Patient ID: Robert Rich, male    DOB: 01-May-1965, 56 y.o.   MRN: [id number] ? ?Chief Complaint  ?Patient presents with  ?? CARDIAC TEST RESULTS  ? ?HPI:   ? ?Robert Rich  is a 56 y.o. Caucasian male with hypertension, hyperlipidemia, uncontrolled DM, obesity, CAD with coronary angiography revealing ostial LAD 50% stenosis in 02/2019, severe COPD, vertebral artery dissection (2016).  Patient was admitted 02/2019 following cardiac arrest which was felt to be related to respiratory failure with underlying severe COPD.  Patient notably quit smoking and drinking alcohol and 02/2019. ? ?Patient was referred back to our office 04/16/2021 for preoperative risk stratification prior to upcoming cervical spine fusion.  Given patient's dyspnea on exertion and limited physical activity as well as known history of CAD recommended echocardiogram and nuclear stress test.  Echocardiogram revealed LVEF of 60 to 65% with normal diastolic function and no significant valvular disease.  Stress test revealed normal myocardial perfusion was overall low risk.  ? ?Patient now presents for follow up.  He remains asymptomatic from a cardiovascular standpoint, but continues to deal with significant neck and left arm pain. Denies chest pain, orthopnea, leg edema, syncope, near syncope. ? ?Past Medical History:  ?Diagnosis Date  ?? COPD (chronic obstructive pulmonary disease) (HCC)   ?? Coronary artery disease   ?? DM (diabetes mellitus) (HCC)   ?? Heart attack (HCC)   ?? HLD (hyperlipidemia)   ?? HTN (hypertension)   ?? Obesity   ?? Stroke [facility])   ? ?Past Surgical History:  ?Procedure Laterality Date  ?? INTRAVASCULAR PRESSURE WIRE/FFR STUDY N/A 03/18/2019  ? Procedure: INTRAVASCULAR PRESSURE WIRE/FFR STUDY;  Surgeon: [name], MD;  Location: [facility] INVASIVE CV LAB;  Service: Cardiovascular;  Laterality: N/A;  ?? LEFT HEART CATH AND CORONARY ANGIOGRAPHY N/A 03/18/2019  ? Procedure: LEFT  HEART CATH AND CORONARY ANGIOGRAPHY;  Surgeon: [name], MD;  Location: [facility] INVASIVE CV LAB;  Service: Cardiovascular;  Laterality: N/A;  ?? NO PAST SURGERIES    ? ?Family History  ?Problem Relation Age of Onset  ?? Heart disease Mother   ?? Heart failure Mother   ?? Atrial fibrillation Mother   ?? Kidney disease Father   ?  ?Social History  ? ?Tobacco Use  ?? Smoking status: Former  ?  Packs/day: 0.10  ?  Years: 20.00  ?  Pack years: 2.00  ?  Types: Cigarettes  ?  Quit date: 03/17/2019  ?  Years since quitting: 2.1  ?? Smokeless tobacco: Never  ?? Tobacco comments:  ?  Quit in feb of 2021  ?Substance Use Topics  ?? Alcohol use: Not Currently  ?  Alcohol/week: 12.0 standard drinks  ?  Types: 12 Cans of beer per week  ?  Comment: Quit in Feb 2021  ? ?ROS  ?Review of Systems  ?Constitutional: Negative for malaise/fatigue and weight gain.  ?Cardiovascular:  Negative for chest pain, claudication, dyspnea on exertion, leg swelling, near-syncope, orthopnea, palpitations, paroxysmal nocturnal dyspnea and syncope.  ?Respiratory:  Positive for cough (chronic) and wheezing (chronic).   ?Musculoskeletal:  Positive for neck pain.  ?Neurological:  Negative for dizziness.  ? ?Objective  ?Blood pressure 128/88, pulse 85, temperature 98.7 ?F (37.1 ?C), temperature source Temporal, resp. rate 17, height 5\' 9"  (1.753 m), weight 240 lb (108.9 kg), SpO2 97 %.  ? ?  05/09/2021  ?  9:27 AM 04/17/2021  ?  4:44 PM 04/16/2021  ?  2:49 PM  ?Vitals with BMI  ?Height 5\' 9"  5\' 9"    ?Weight 240 lbs 239 lbs   ?BMI 35.43 35.28   ?Systolic 128 138  ?Diastolic 88 74 83  ?Pulse 85 88 88  ?  ? Physical Exam ?Vitals reviewed.  ?Constitutional:   ?   Appearance: He is obese.  ?Cardiovascular:  ?   Rate and Rhythm: Normal rate and regular rhythm.  ?   Pulses: Intact distal pulses.  ?   Heart sounds: Normal heart sounds. No murmur heard. ?  No gallop.  ?   Comments: No JVD. ?Pulmonary:  ?   Effort: Pulmonary effort is normal.  ?   Breath sounds: Rales  (diffuse scattered) present. No wheezing.  ?Chest:  ? ? ?Musculoskeletal:  ?   Right lower leg: No edema.  ?   Left lower leg: No edema.  ? ?Laboratory examination:  ? ?No results for input(s): NA, K, CL, CO2, GLUCOSE, BUN, CREATININE, CALCIUM, GFRNONAA, GFRAA in the last 8760 hours. ? ?CrCl cannot be calculated (Patient's most recent lab result is older than the maximum 21 days allowed.).  ? ?  Latest Ref Rng & Units 12/22/2019  ? 11:31 PM 12/22/2019  ?  6:47 AM 12/21/2019  ?  4:41 AM  ?CMP  ?Glucose 70 - 99 mg/dL 12/24/2019   12/23/2019   073    ?BUN 6 - 20 mg/dL  23   16    ?Creatinine 0.61 - 1.24 mg/dL  710   626    ?Sodium 135 - 145 mmol/L  136   136    ?Potassium 3.5 - 5.1 mmol/L  4.3   5.2    ?Chloride 98 - 111 mmol/L  97   99    ?CO2 22 - 32 mmol/L  27   25    ?Calcium 8.9 - 10.3 mg/dL  9.1   9.2    ?Total Protein 6.5 - 8.1 g/dL   8.4    ?Total Bilirubin 0.3 - 1.2 mg/dL   0.3    ?Alkaline Phos 38 - 126 U/L   55    ?AST 15 - 41 U/L   17    ?ALT 0 - 44 U/L   20    ? ? ?  Latest Ref Rng & Units 12/24/2019  ?  8:36 AM 12/22/2019  ?  6:47 AM 12/21/2019  ?  4:41 AM  ?CBC  ?WBC 4.0 - 10.5 K/uL 12.8   15.0   17.5    ?Hemoglobin 13.0 - 17.0 g/dL 12/24/2019   12/23/2019   27.0    ?Hematocrit 39.0 - 52.0 % 43.2   40.1   42.4    ?Platelets 150 - 400 K/uL 485   449   479    ? ?Lipid Panel  ?   ?Component Value Date/Time  ? CHOL 154 12/21/2019 0441  ? TRIG 83 12/21/2019 0441  ? HDL 54 12/21/2019 0441  ? CHOLHDL 2.9 12/21/2019 0441  ? VLDL 17 12/21/2019 0441  ? LDLCALC 83 12/21/2019 0441  ? ?HEMOGLOBIN A1C ?Lab Results  ?Component Value Date  ? HGBA1C 7.2 (H) 12/20/2019  ? MPG 159.94 12/20/2019  ? ?TSH ?No results for input(s): TSH in the last 8760 hours. ? ?Allergies  ? ?Allergies  ?Allergen Reactions  ?? Asa [Aspirin] Hives  ?? Zolmitriptan Nausea And Vomiting and Other (See Comments)  ?  Severe headaches  ?  ?Medications Prior to Visit:  ? ?Outpatient Medications Prior to Visit  ?  Medication Sig Dispense Refill  ?? albuterol (PROVENTIL) (2.5  MG/3ML) 0.083% nebulizer solution Take 3 mLs (2.5 mg total) by nebulization every 4 (four) hours as needed for wheezing or shortness of breath. 75 mL 12  ?? ALBUTEROL IN Inhale 2 puffs into the lungs every 4 (four) hours as needed.    ?? ANORO ELLIPTA 62.5-25 MCG/ACT AEPB Inhale 1 puff into the lungs daily.    ?? atorvastatin (LIPITOR) 40 MG tablet Take 1 tablet (40 mg total) by mouth daily at 6 PM. 90 tablet 1  ?? butalbital-acetaminophen-caffeine (FIORICET) 50-325-40 MG tablet Take 1 tablet by mouth every 6 (six) hours as needed for migraine. 20 tablet 0  ?? clopidogrel (PLAVIX) 75 MG tablet Take 1 tablet (75 mg total) by mouth daily. 30 tablet 2  ?? cyclobenzaprine (FLEXERIL) 10 MG tablet Take 1 tablet by mouth as needed.    ?? FARXIGA 10 MG TABS tablet Take 10 mg by mouth daily.    ?? folic acid (FOLVITE) 1 MG tablet Take 1 tablet (1 mg total) by mouth daily. 30 tablet 2  ?? insulin NPH-regular Human (NOVOLIN 70/30) (70-30) 100 UNIT/ML injection Inject 50 Units into the skin 2 (two) times daily with a meal. 10 mL 1  ?? ipratropium-albuterol (DUONEB) 0.5-2.5 (3) MG/3ML SOLN Inhale 3 mLs into the lungs daily.    ?? losartan (COZAAR) 50 MG tablet Take 50 mg by mouth daily.    ?? metFORMIN (GLUCOPHAGE) 1000 MG tablet Take 1 tablet (1,000 mg total) by mouth 2 (two) times daily with a meal. 60 tablet 2  ?? metoprolol succinate (TOPROL-XL) 25 MG 24 hr tablet Take 1 tablet (25 mg total) by mouth daily. 30 tablet 2  ?? montelukast (SINGULAIR) 10 MG tablet Take 1 tablet (10 mg total) by mouth at bedtime. 30 tablet 1  ?? montelukast (SINGULAIR) 10 MG tablet Take 1 tablet by mouth at bedtime as needed.    ?? Multiple Vitamin (MULTIVITAMIN WITH MINERALS) TABS tablet Take 1 tablet by mouth daily. 30 tablet 2  ?? pregabalin (LYRICA) 75 MG capsule Take 75 mg by mouth 3 (three) times daily.    ?? thiamine 100 MG tablet Take 2.5 tablets (250 mg total) by mouth daily. (Patient taking differently: Take 100 mg by mouth daily.) 30  tablet 2  ?? rosuvastatin (CRESTOR) 20 MG tablet Take 20 mg by mouth at bedtime.    ? ?No facility-administered medications prior to visit.  ? ?Final Medications at End of Visit   ? ?Current Meds  ?Medicatio

## 2021-05-08 ENCOUNTER — Ambulatory Visit: Payer: Medicaid Other | Admitting: Student

## 2021-05-09 ENCOUNTER — Ambulatory Visit: Payer: Medicaid Other | Admitting: Student

## 2021-05-09 ENCOUNTER — Encounter: Payer: Self-pay | Admitting: Student

## 2021-05-09 VITALS — BP 128/88 | HR 85 | Temp 98.7°F | Resp 17 | Ht 69.0 in | Wt 240.0 lb

## 2021-05-09 DIAGNOSIS — Z01810 Encounter for preprocedural cardiovascular examination: Secondary | ICD-10-CM

## 2021-05-09 DIAGNOSIS — I2510 Atherosclerotic heart disease of native coronary artery without angina pectoris: Secondary | ICD-10-CM

## 2021-05-09 DIAGNOSIS — R079 Chest pain, unspecified: Secondary | ICD-10-CM

## 2021-05-19 ENCOUNTER — Ambulatory Visit: Payer: Self-pay | Admitting: Orthopedic Surgery

## 2021-05-24 NOTE — Progress Notes (Signed)
Surgical Instructions ? ? ? Your procedure is scheduled on Monday May 8th. ? Report to [facility] Main Entrance "A" at 5:30 A.M., then check in with the Admitting office. ? Call this number if you have problems the morning of surgery: ? [phone] ? ? If you have any questions prior to your surgery date call [phone]: Open Monday-Friday 8am-4pm ? ? ? Remember: ? Do not eat or drink after midnight the night before your surgery ? ?  ? Take these medicines the morning of surgery with A SIP OF WATER: ?ANORO ELLIPTA 62.5-25 MCG/ACT AEPB - please bring with you to the hospital ?metoprolol succinate (TOPROL-XL) 25 MG 24 hr tablet ?pregabalin (LYRICA) 75 MG capsule ? ?IF NEEDED  ?albuterol (VENTOLIN HFA) 108 (90 Base) MCG/ACT inhaler- please bring with you to the hospital ?cyclobenzaprine (FLEXERIL) 10 MG tablet ?HYDROcodone-acetaminophen (NORCO/VICODIN) 5-325 MG tablet ?ipratropium-albuterol (DUONEB) 0.5-2.5 (3) MG/3ML SOLN ? ?Follow your surgeon's instructions on when to stop Plavix.  If no instructions were given by your surgeon then you will need to call the office to get those instructions.   ? ? ?As of today, STOP taking any Aspirin (unless otherwise instructed by your surgeon) Aleve, Naproxen, Ibuprofen, Motrin, Advil, Goody's, BC's, all herbal medications, fish oil, and all vitamins. ? ?WHAT DO I DO ABOUT MY DIABETES MEDICATION? ? ? ?Do not take oral diabetes medicines (Farxiga and Metformin) the morning of surgery. ?  ?Do NOT take you Marcelline Deist the day before surgery ? ?THE DAY BEFORE SURGERY, you may take 100% of a morning dose of the Novolin 70/30 insulin.  For you evening dose you may take 70% of you Novolin 70/30 (25 units)     ?THE MORNING OF SURGERY, Do NOT take any Novolin 70/30 insulin ? ?The day of surgery, do not take other diabetes injectables, including Byetta (exenatide), Bydureon (exenatide ER), Victoza (liraglutide), or Trulicity (dulaglutide). ? ?If your CBG is greater than 220 mg/dL, you may  take ? of your sliding scale (correction) dose of insulin. ? ? ?HOW TO MANAGE YOUR DIABETES ?BEFORE AND AFTER SURGERY ? ?Why is it important to control my blood sugar before and after surgery? ?Improving blood sugar levels before and after surgery helps healing and can limit problems. ?A way of improving blood sugar control is eating a healthy diet by: ? Eating less sugar and carbohydrates ? Increasing activity/exercise ? Talking with your doctor about reaching your blood sugar goals ?High blood sugars (greater than 180 mg/dL) can raise your risk of infections and slow your recovery, so you will need to focus on controlling your diabetes during the weeks before surgery. ?Make sure that the doctor who takes care of your diabetes knows about your planned surgery including the date and location. ? ?How do I manage my blood sugar before surgery? ?Check your blood sugar at least 4 times a day, starting 2 days before surgery, to make sure that the level is not too high or low. ? ?Check your blood sugar the morning of your surgery when you wake up and every 2 hours until you get to the Short Stay unit. ? ?If your blood sugar is less than 70 mg/dL, you will need to treat for low blood sugar: ?Do not take insulin. ?Treat a low blood sugar (less than 70 mg/dL) with ? cup of clear juice (cranberry or apple), 4 glucose tablets, OR glucose gel. ?Recheck blood sugar in 15 minutes after treatment (to make sure it is greater than 70 mg/dL). If your  blood sugar is not greater than 70 mg/dL on recheck, call [phone] for further instructions. ?Report your blood sugar to the short stay nurse when you get to Short Stay. ? ?If you are admitted to the hospital after surgery: ?Your blood sugar will be checked by the staff and you will probably be given insulin after surgery (instead of oral diabetes medicines) to make sure you have good blood sugar levels. ?The goal for blood sugar control after surgery is 80-180 mg/dL. ?         ?Do  not wear jewelry  ?Do not wear lotions, powders, colognes, or deodorant. ?Do not shave 48 hours prior to surgery.  Men may shave face and neck. ?Do not bring valuables to the hospital. ?Do not wear nail polish, gel polish, artificial nails, or any other type of covering on natural nails (fingers and toes) ?If you have artificial nails or gel coating that need to be removed by a nail salon, please have this removed prior to surgery. Artificial nails or gel coating may interfere with anesthesia's ability to adequately monitor your vital signs. ? ?[facility] is not responsible for any belongings or valuables. .  ? ?Do NOT Smoke (Tobacco/Vaping)  24 hours prior to your procedure ? ?If you use a CPAP at night, you may bring your mask for your overnight stay. ?  ?Contacts, glasses, hearing aids, dentures or partials may not be worn into surgery, please bring cases for these belongings ?  ?For patients admitted to the hospital, discharge time will be determined by your treatment team. ?  ?Patients discharged the day of surgery will not be allowed to drive home, and someone needs to stay with them for 24 hours. ? ? ?SURGICAL WAITING ROOM VISITATION ?Patients having surgery or a procedure in a hospital may have two support people. ?Children under the age of 55 must have an adult with them who is not the patient. ?They may stay in the waiting area during the procedure and may switch out with other visitors. If the patient needs to stay at the hospital during part of their recovery, the visitor guidelines for inpatient rooms apply. ? ?Please refer to the [facility] website for the visitor guidelines for Inpatients (after your surgery is over and you are in a regular room).  ? ? ? ? ? ?Special instructions:   ? ?Oral Hygiene is also important to reduce your risk of infection.  Remember - BRUSH YOUR TEETH THE MORNING OF SURGERY WITH YOUR REGULAR TOOTHPASTE ? ? ?[facility]- Preparing For Surgery ? ?Before surgery, you can play  an important role. Because skin is not sterile, your skin needs to be as free of germs as possible. You can reduce the number of germs on your skin by washing with CHG (chlorahexidine gluconate) Soap before surgery.  CHG is an antiseptic cleaner which kills germs and bonds with the skin to continue killing germs even after washing.   ? ? ?Please do not use if you have an allergy to CHG or antibacterial soaps. If your skin becomes reddened/irritated stop using the CHG.  ?Do not shave (including legs and underarms) for at least 48 hours prior to first CHG shower. It is OK to shave your face. ? ?Please follow these instructions carefully. ?  ? ? Shower the NIGHT BEFORE SURGERY and the MORNING OF SURGERY with CHG Soap.  ? If you chose to wash your hair, wash your hair first as usual with your normal shampoo. After you shampoo,  rinse your hair and body thoroughly to remove the shampoo.  Then [employer] and genitals (private parts) with your normal soap and rinse thoroughly to remove soap. ? ?After that Use CHG Soap as you would any other liquid soap. You can apply CHG directly to the skin and wash gently with a scrungie or a clean washcloth.  ? ?Apply the CHG Soap to your body ONLY FROM THE NECK DOWN.  Do not use on open wounds or open sores. Avoid contact with your eyes, ears, mouth and genitals (private parts). Wash Face and genitals (private parts)  with your normal soap.  ? ?Wash thoroughly, paying special attention to the area where your surgery will be performed. ? ?Thoroughly rinse your body with warm water from the neck down. ? ?DO NOT shower/wash with your normal soap after using and rinsing off the CHG Soap. ? ?Pat yourself dry with a CLEAN TOWEL. ? ?Wear CLEAN PAJAMAS to bed the night before surgery ? ?Place CLEAN SHEETS on your bed the night before your surgery ? ?DO NOT SLEEP WITH PETS. ? ? ?Day of Surgery: ? ?Take a shower with CHG soap. ?Wear Clean/Comfortable clothing the morning of surgery ?Do not apply  any deodorants/lotions.   ?Remember to brush your teeth WITH YOUR REGULAR TOOTHPASTE. ? ? ? ?If you received a COVID test during your pre-op visit, it is requested that you wear a mask when out in public,

## 2021-05-27 ENCOUNTER — Encounter (HOSPITAL_COMMUNITY): Payer: Self-pay

## 2021-05-27 ENCOUNTER — Encounter (HOSPITAL_COMMUNITY)
Admission: RE | Admit: 2021-05-27 | Discharge: 2021-05-27 | Disposition: A | Discharge status: Home / Self Care | Payer: Medicaid Other | Source: Ambulatory Visit | Attending: Orthopedic Surgery | Admitting: Orthopedic Surgery

## 2021-05-27 ENCOUNTER — Other Ambulatory Visit: Payer: Self-pay

## 2021-05-27 VITALS — BP 146/68 | HR 88 | Temp 98.5°F | Resp 18 | Ht 69.0 in | Wt 238.0 lb

## 2021-05-27 DIAGNOSIS — Z87891 Personal history of nicotine dependence: Secondary | ICD-10-CM | POA: Insufficient documentation

## 2021-05-27 DIAGNOSIS — E785 Hyperlipidemia, unspecified: Secondary | ICD-10-CM | POA: Diagnosis not present

## 2021-05-27 DIAGNOSIS — Z01812 Encounter for preprocedural laboratory examination: Secondary | ICD-10-CM | POA: Insufficient documentation

## 2021-05-27 DIAGNOSIS — Z8674 Personal history of sudden cardiac arrest: Secondary | ICD-10-CM | POA: Insufficient documentation

## 2021-05-27 DIAGNOSIS — Z01818 Encounter for other preprocedural examination: Secondary | ICD-10-CM

## 2021-05-27 DIAGNOSIS — I10 Essential (primary) hypertension: Secondary | ICD-10-CM | POA: Insufficient documentation

## 2021-05-27 DIAGNOSIS — I2510 Atherosclerotic heart disease of native coronary artery without angina pectoris: Secondary | ICD-10-CM | POA: Insufficient documentation

## 2021-05-27 HISTORY — DX: Dyspnea, unspecified: R06.00

## 2021-05-27 LAB — CBC
HCT: 44.2 % (ref 39.0–52.0)
Hemoglobin: 14.4 g/dL (ref 13.0–17.0)
MCH: 28 pg (ref 26.0–34.0)
MCHC: 32.6 g/dL (ref 30.0–36.0)
MCV: 86 fL (ref 80.0–100.0)
Platelets: 438 10*3/uL — ABNORMAL HIGH (ref 150–400)
RBC: 5.14 MIL/uL (ref 4.22–5.81)
RDW: 12.3 % (ref 11.5–15.5)
WBC: 11.4 10*3/uL — ABNORMAL HIGH (ref 4.0–10.5)
nRBC: 0 % (ref 0.0–0.2)

## 2021-05-27 LAB — GLUCOSE, CAPILLARY: Glucose-Capillary: 226 mg/dL — ABNORMAL HIGH (ref 70–99)

## 2021-05-27 LAB — TYPE AND SCREEN
ABO/RH(D): AB POS
Antibody Screen: NEGATIVE

## 2021-05-27 LAB — BASIC METABOLIC PANEL
Anion gap: 10 (ref 5–15)
BUN: 11 mg/dL (ref 6–20)
CO2: 26 mmol/L (ref 22–32)
Calcium: 9.5 mg/dL (ref 8.9–10.3)
Chloride: 102 mmol/L (ref 98–111)
Creatinine, Ser: 0.75 mg/dL (ref 0.61–1.24)
GFR, Estimated: >60 mL/min (ref >60)
Glucose, Bld: 190 mg/dL — ABNORMAL HIGH (ref 70–99)
Potassium: 4.1 mmol/L (ref 3.5–5.1)
Sodium: 138 mmol/L (ref 135–145)

## 2021-05-27 LAB — SURGICAL PCR SCREEN
MRSA, PCR: NEGATIVE
Staphylococcus aureus: POSITIVE — AB

## 2021-05-27 LAB — HEMOGLOBIN A1C
Hgb A1c MFr Bld: 9.4 % — ABNORMAL HIGH (ref 4.8–5.6)
Mean Plasma Glucose: 223.08 mg/dL

## 2021-05-27 NOTE — Progress Notes (Addendum)
PCP:  Sharon Seller, NP ?Cardiologist: [name], MD ?Pulmonologist: Sandrea Hughs, MD ? ?EKG:  05/09/21 ?CXR:  na ?ECHO:  04/24/21 ?Stress Test:  05/01/21 ?Cardiac Cath:  03/18/19 ? ?Fasting Blood Sugar-  160-180 ?Checks Blood Sugar__4-5_  times a day ? ?ASA: No ?Blood Thinner: Patient to call office today for Plavix instructions ? ?OSA/CPAP: No ? ?Covid test not needed ? ?Anesthesia Review: Yes, cardiac history.  A1C 9.4 ? ?Patient denies shortness of breath, fever, cough, and chest pain at PAT appointment. ? ?Patient verbalized understanding of instructions provided today at the PAT appointment.  Patient asked to review instructions at home and day of surgery.   ?

## 2021-05-28 NOTE — Anesthesia Preprocedure Evaluation (Addendum)
Anesthesia Evaluation  ?Patient identified by MRN, date of birth, ID band ?Patient awake ? ? ? ?Reviewed: ?Allergy & Precautions, NPO status , Patient's Chart, lab work & pertinent test results ? ?Airway ?Mallampati: III ? ?TM Distance: >3 FB ?Neck ROM: Full ? ? ? Dental ?no notable dental hx. ? ?  ?Pulmonary ?COPD, former smoker,  ?  ?breath sounds clear to auscultation ?+ decreased breath sounds ? ? ? ? ? Cardiovascular ?hypertension, + CAD and + Past MI  ?Normal cardiovascular exam ?Rhythm:Regular Rate:Normal ? ? ?  ?Neuro/Psych ?Anxiety CVA (on plavix for secondary prevention) negative psych ROS  ? GI/Hepatic ?negative GI ROS, Neg liver ROS,   ?Endo/Other  ?diabetes, Type 2hyperlipidemia ? Renal/GU ?negative Renal ROS  ?negative genitourinary ?  ?Musculoskeletal ?negative musculoskeletal ROS ?(+)  ? Abdominal ?  ?Peds ?negative pediatric ROS ?(+)  Hematology ?negative hematology ROS ?(+)   ?Anesthesia Other Findings ? ? Reproductive/Obstetrics ?negative OB ROS ? ?  ? ? ? ? ? ? ? ? ? ? ? ? ? ?  ?  ? ? ? ? ? ? ?Anesthesia Physical ?Anesthesia Plan ? ?ASA: 3 ? ?Anesthesia Plan: General  ? ?Post-op Pain Management: Tylenol PO (pre-op)*  ? ?Induction: Intravenous ? ?PONV Risk Score and Plan: 2 and Treatment may vary due to age or medical condition, Ondansetron, Dexamethasone and Midazolam ? ?Airway Management Planned: Oral ETT and Video Laryngoscope Planned ? ?Additional Equipment:  ? ?Intra-op Plan:  ? ?Post-operative Plan: Extubation in OR ? ?Informed Consent: I have reviewed the patients History and Physical, chart, labs and discussed the procedure including the risks, benefits and alternatives for the proposed anesthesia with the patient or authorized representative who has indicated his/her understanding and acceptance.  ? ? ? ?Dental advisory given ? ?Plan Discussed with: CRNA, Anesthesiologist and Surgeon ? ?Anesthesia Plan Comments: (Last took plavix 2 weeks ago. Patient  states he is not on a beta-blocker. He took Losartan, Metformin, and Farxiga this AM. He is normotensive. BS is 192. Will hold off on giving any insulin for now. If hypotensive intra-op, may need albumin/vasopressin given he took his ARB this AM. Norton Blizzard, MD  ? ? ? ?PAT note by Karoline Caldwell, PA-C: ?Follows with cardiology for history of HTN, HLD, nonobstructive CAD by cath 2021.  Patient was admitted February 2021 following cardiac arrest which was felt to be related to respiratory failure with underlying COPD/respiratory failure.  Patient reportedly quit smoking and to hold/drug use subsequently.  Patient last seen by cardiology 04/16/2021 for preop evaluation. Echo and stress test were ordered. Echocardiogram revealed LVEF of 60 to 65% with normal diastolic function and no significant valvular disease. ?Stress test revealed normal myocardial perfusion was overall low risk.  Subsequently cleared to undergo surgery at low cardiovascular risk per note 05/09/2021. ? ?History of CVA 2016, maintained on Plavix for secondary prevention. Per PAT RN, patient did not know when to stop his Plavix.  She advised him to reach out to surgeon's office ASAP.  I also called Dr. Charmayne Sheer surgical scheduler and requested she reach out to patient to make sure he is instructed on stopping Plavix. ? ?Patient last seen by pulmonologist Dr. Melvyn Novas on 04/17/2021 and at that time was stable from a pulmonary standpoint.  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  Maintained on Anoro Ellipta.  Per note, cleared for neck surgery.  Recommended follow-up with pulmonology on an as-needed basis. ? ?IDDM 2, poorly controlled, A1c 9.4 on  preop labs.  This was called to surgeon's office. ? ?Remainder of preop labs reviewed, unremarkable. ? ?EKG 05/09/2021: Sinus rhythm at a rate of 70 bpm. ?Left axis, left anterior fascicular block. ?Poor R wave progression, cannot exclude anteroseptal infarct old. ?No evidence of  ischemia or underlying injury pattern. ?Compared EKG 04/16/2021, no significant change. ? ?Nuclear stress 05/01/2021 ?Lexiscan/modified Bruce nuclear stress test performed using 1-day protocol. In addition, patient achieved 2.2 METS and reached 82% MPHR. ?Normal myocardial perfusion. Stress LVEF 78%. ?Low risk study. ? ?TTE 04/24/2021 ?Normal LV systolic function with visual EF 60-65%. Left ventricle cavity is normal in size. Normal left ventricular wall thickness. Normal global wall motion. Normal diastolic filling pattern, normal LAP. ?No significant valvular heart disease. ?No prior study for comparison.  ? ?Left Heart Catheterization 03/18/19:? ?LV normal LVEF grossly. EDP mardekly elevated ?Right coronary artery nondominant small, normal. ?Left main large, normal. ?Ramus intermediate large, normal. ?Circumflex large, dominant, normal. ?LAD ostial 50% stenosis. ?FFR 0.89, not hemodynamically significant. ?? ?Impression:?Patient developed significant respiratory distress probably combination of acute COPD exacerbation and also patient received intravenous adenosine for FFR evaluation. ?Received albuterol inhalation and also oxygen supplementation with stabilization immediately. ?Respiratory distress could also be related to combination of acute diastolic heart failure with markedly elevated EDP. ?Patient also received IV Lasix. ?? ?Recommendation:?No significant coronary disease, needs primary prevention, consider addition of Plavix as patient is allergic to aspirin in view of moderate coronary artery disease involving the ostial LAD. ?Primary prevention with aggressive control of diabetes, hypertension and hyperlipidemia is indicated. ?Suspect is cardiac arrest was secondary to underlying COPD with respiratory distress and severe hypoxemia as evident by similar presentation in the cardiac catheterization lab where his sats dropped down to 70s. ?Smoking cessation is indicated as well. ?We will follow up on the  echocardiogram. ? ?)  ? ? ? ? ?Anesthesia Quick Evaluation ? ?

## 2021-05-28 NOTE — Progress Notes (Signed)
Anesthesia Chart Review: ? ?Follows with cardiology for history of HTN, HLD, nonobstructive CAD by cath 2021.  Patient was admitted February 2021 following cardiac arrest which was felt to be related to respiratory failure with underlying COPD/respiratory failure.  Patient reportedly quit smoking and to hold/drug use subsequently.  Patient last seen by cardiology 04/16/2021 for preop evaluation. Echo and stress test were ordered. Echocardiogram revealed LVEF of 60 to 65% with normal diastolic function and no significant valvular disease.  Stress test revealed normal myocardial perfusion was overall low risk.  Subsequently cleared to undergo surgery at low cardiovascular risk per note 05/09/2021. ? ?History of CVA 2016, maintained on Plavix for secondary prevention. Per PAT RN, patient did not know when to stop his Plavix.  She advised him to reach out to surgeon's office ASAP.  I also called Dr. Charmayne Sheer surgical scheduler and requested she reach out to patient to make sure he is instructed on stopping Plavix. ? ?Patient last seen by pulmonologist Dr. Melvyn Novas on 04/17/2021 and at that time was stable from a pulmonary standpoint.  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  Maintained on Anoro Ellipta.  Per note, cleared for neck surgery.  Recommended follow-up with pulmonology on an as-needed basis. ? ?IDDM 2, poorly controlled, A1c 9.4 on preop labs.  This was called to surgeon's office. ? ?Remainder of preop labs reviewed, unremarkable. ? ?EKG 05/09/2021: Sinus rhythm at a rate of 70 bpm.  Left axis, left anterior fascicular block.  Poor R wave progression, cannot exclude anteroseptal infarct old.  No evidence of ischemia or underlying injury pattern.  Compared EKG 04/16/2021, no significant change. ? ?Nuclear stress 05/01/2021 ?Lexiscan/modified Bruce nuclear stress test performed using 1-day protocol. In addition, patient achieved 2.2 METS and reached 82% MPHR. ?Normal myocardial  perfusion. Stress LVEF 78%. ?Low risk study. ? ?TTE 04/24/2021 ?Normal LV systolic function with visual EF 60-65%. Left ventricle cavity is normal in size. Normal left ventricular wall thickness. Normal global wall motion. Normal diastolic filling pattern, normal LAP. ?No significant valvular heart disease. ?No prior study for comparison.  ? ?Left Heart Catheterization 03/18/19:  ?LV normal LVEF grossly. EDP mardekly elevated ?Right coronary artery nondominant small, normal. ?Left main large, normal. ?Ramus intermediate large, normal. ?Circumflex large, dominant, normal. ?LAD ostial 50% stenosis.  FFR 0.89, not hemodynamically significant. ?  ?Impression: Patient developed significant respiratory distress probably combination of acute COPD exacerbation and also patient received intravenous adenosine for FFR evaluation.  Received albuterol inhalation and also oxygen supplementation with stabilization immediately.  Respiratory distress could also be related to combination of acute diastolic heart failure with markedly elevated EDP.  Patient also received IV Lasix. ?  ?Recommendation: No significant coronary disease, needs primary prevention, consider addition of Plavix as patient is allergic to aspirin in view of moderate coronary artery disease involving the ostial LAD.  Primary prevention with aggressive control of diabetes, hypertension and hyperlipidemia is indicated.  Suspect is cardiac arrest was secondary to underlying COPD with respiratory distress and severe hypoxemia as evident by similar presentation in the cardiac catheterization lab where his sats dropped down to 70s.  Smoking cessation is indicated as well.  We will follow up on the echocardiogram. ? ? ? ? ?Karoline Caldwell, PA-C ?[facility]/Anesthesiology ?Phone [phone] ?05/28/2021 1:23 PM ? ?

## 2021-06-07 NOTE — Progress Notes (Signed)
Patient states no changes since PAT appointment.  Patient aware of new surgery time and new arrival time.   ?

## 2021-06-10 ENCOUNTER — Ambulatory Visit (HOSPITAL_COMMUNITY): Payer: Medicaid Other

## 2021-06-10 ENCOUNTER — Encounter (HOSPITAL_COMMUNITY)
Admission: RE | Disposition: A | Discharge status: Home / Self Care | Payer: Self-pay | Source: Ambulatory Visit | Attending: Orthopedic Surgery

## 2021-06-10 ENCOUNTER — Ambulatory Visit (HOSPITAL_COMMUNITY): Payer: Medicaid Other | Admitting: Physician Assistant

## 2021-06-10 ENCOUNTER — Encounter (HOSPITAL_COMMUNITY): Payer: Self-pay | Admitting: Orthopedic Surgery

## 2021-06-10 ENCOUNTER — Observation Stay (HOSPITAL_COMMUNITY)
Admission: RE | Admit: 2021-06-10 | Discharge: 2021-06-11 | Disposition: A | Discharge status: Home / Self Care | Payer: Medicaid Other | Source: Ambulatory Visit | Attending: Orthopedic Surgery | Admitting: Orthopedic Surgery

## 2021-06-10 ENCOUNTER — Other Ambulatory Visit: Payer: Self-pay

## 2021-06-10 ENCOUNTER — Ambulatory Visit (HOSPITAL_BASED_OUTPATIENT_CLINIC_OR_DEPARTMENT_OTHER): Payer: Medicaid Other | Admitting: Anesthesiology

## 2021-06-10 DIAGNOSIS — Z7902 Long term (current) use of antithrombotics/antiplatelets: Secondary | ICD-10-CM | POA: Insufficient documentation

## 2021-06-10 DIAGNOSIS — M4692 Unspecified inflammatory spondylopathy, cervical region: Secondary | ICD-10-CM | POA: Insufficient documentation

## 2021-06-10 DIAGNOSIS — M50121 Cervical disc disorder at C4-C5 level with radiculopathy: Secondary | ICD-10-CM | POA: Diagnosis not present

## 2021-06-10 DIAGNOSIS — J449 Chronic obstructive pulmonary disease, unspecified: Secondary | ICD-10-CM | POA: Insufficient documentation

## 2021-06-10 DIAGNOSIS — Z7984 Long term (current) use of oral hypoglycemic drugs: Secondary | ICD-10-CM | POA: Diagnosis not present

## 2021-06-10 DIAGNOSIS — Z79899 Other long term (current) drug therapy: Secondary | ICD-10-CM | POA: Insufficient documentation

## 2021-06-10 DIAGNOSIS — Z794 Long term (current) use of insulin: Secondary | ICD-10-CM | POA: Diagnosis not present

## 2021-06-10 DIAGNOSIS — I252 Old myocardial infarction: Secondary | ICD-10-CM

## 2021-06-10 DIAGNOSIS — I10 Essential (primary) hypertension: Secondary | ICD-10-CM | POA: Insufficient documentation

## 2021-06-10 DIAGNOSIS — M4722 Other spondylosis with radiculopathy, cervical region: Secondary | ICD-10-CM

## 2021-06-10 DIAGNOSIS — M4802 Spinal stenosis, cervical region: Secondary | ICD-10-CM | POA: Diagnosis not present

## 2021-06-10 DIAGNOSIS — E119 Type 2 diabetes mellitus without complications: Secondary | ICD-10-CM | POA: Insufficient documentation

## 2021-06-10 DIAGNOSIS — I2510 Atherosclerotic heart disease of native coronary artery without angina pectoris: Secondary | ICD-10-CM | POA: Diagnosis not present

## 2021-06-10 DIAGNOSIS — Z8673 Personal history of transient ischemic attack (TIA), and cerebral infarction without residual deficits: Secondary | ICD-10-CM | POA: Insufficient documentation

## 2021-06-10 DIAGNOSIS — M542 Cervicalgia: Secondary | ICD-10-CM | POA: Diagnosis present

## 2021-06-10 DIAGNOSIS — M5020 Other cervical disc displacement, unspecified cervical region: Secondary | ICD-10-CM | POA: Diagnosis present

## 2021-06-10 HISTORY — PX: ANTERIOR CERVICAL DECOMPRESSION/DISCECTOMY FUSION 4 LEVELS: SHX5556

## 2021-06-10 LAB — GLUCOSE, CAPILLARY
Glucose-Capillary: 192 mg/dL — ABNORMAL HIGH (ref 70–99)
Glucose-Capillary: 184 mg/dL — ABNORMAL HIGH (ref 70–99)
Glucose-Capillary: 208 mg/dL — ABNORMAL HIGH (ref 70–99)

## 2021-06-10 SURGERY — ANTERIOR CERVICAL DECOMPRESSION/DISCECTOMY FUSION 4 LEVELS
Anesthesia: General | Site: Spine Cervical

## 2021-06-10 MED ORDER — PROPOFOL 10 MG/ML IV BOLUS
INTRAVENOUS | Status: AC
  Filled 2021-06-10: qty 20

## 2021-06-10 MED ORDER — BSS IO SOLN
15.0000 mL | Freq: Once | INTRAOCULAR | Status: AC
  Administered 2021-06-10: 15 mL
  Filled 2021-06-10: qty 15

## 2021-06-10 MED ORDER — FOLIC ACID 1 MG PO TABS: 1.0000 mg | ORAL_TABLET | Freq: Every day | ORAL | Status: DC

## 2021-06-10 MED ORDER — UMECLIDINIUM-VILANTEROL 62.5-25 MCG/ACT IN AEPB
1.0000 | INHALATION_SPRAY | Freq: Every day | RESPIRATORY_TRACT | Status: DC
  Filled 2021-06-10 (×2): qty 14

## 2021-06-10 MED ORDER — PHENOL 1.4 % MT LIQD
1.0000 | OROMUCOSAL | Status: DC | PRN
  Administered 2021-06-10: 1 via OROMUCOSAL
  Filled 2021-06-10: qty 177

## 2021-06-10 MED ORDER — ONDANSETRON HCL 4 MG PO TABS: 4.0000 mg | ORAL_TABLET | Freq: Four times a day (QID) | ORAL | Status: DC | PRN

## 2021-06-10 MED ORDER — DAPAGLIFLOZIN PROPANEDIOL 10 MG PO TABS
10.0000 mg | ORAL_TABLET | Freq: Every day | ORAL | Status: DC
Start: 2021-06-11 — End: 2021-06-11
  Filled 2021-06-10: qty 1

## 2021-06-10 MED ORDER — CHLORHEXIDINE GLUCONATE 0.12 % MT SOLN
OROMUCOSAL | Status: AC
  Administered 2021-06-10: 15 mL
  Filled 2021-06-10: qty 15

## 2021-06-10 MED ORDER — LIDOCAINE 2% (20 MG/ML) 5 ML SYRINGE
INTRAMUSCULAR | Status: AC
  Filled 2021-06-10: qty 5

## 2021-06-10 MED ORDER — ACETAMINOPHEN 500 MG PO TABS: 1000.0000 mg | ORAL_TABLET | Freq: Once | ORAL | Status: AC

## 2021-06-10 MED ORDER — ROCURONIUM BROMIDE 10 MG/ML (PF) SYRINGE
PREFILLED_SYRINGE | INTRAVENOUS | Status: DC | PRN
  Administered 2021-06-10: 20 mg via INTRAVENOUS
  Administered 2021-06-10: 90 mg via INTRAVENOUS
  Administered 2021-06-10: 20 mg via INTRAVENOUS

## 2021-06-10 MED ORDER — CEFAZOLIN SODIUM-DEXTROSE 2-4 GM/100ML-% IV SOLN
INTRAVENOUS | Status: AC
  Filled 2021-06-10: qty 100

## 2021-06-10 MED ORDER — ONDANSETRON HCL 4 MG/2ML IJ SOLN
INTRAMUSCULAR | Status: DC | PRN
Start: 2021-06-10 — End: 2021-06-10
  Administered 2021-06-10: 4 mg via INTRAVENOUS

## 2021-06-10 MED ORDER — METHOCARBAMOL 500 MG PO TABS
500.0000 mg | ORAL_TABLET | Freq: Four times a day (QID) | ORAL | Status: DC | PRN
  Administered 2021-06-10 – 2021-06-11 (×2): 500 mg via ORAL
  Filled 2021-06-10 (×2): qty 1

## 2021-06-10 MED ORDER — MIDAZOLAM HCL 2 MG/2ML IJ SOLN
INTRAMUSCULAR | Status: DC | PRN
Start: 2021-06-10 — End: 2021-06-10
  Administered 2021-06-10: 2 mg via INTRAVENOUS

## 2021-06-10 MED ORDER — ROCURONIUM BROMIDE 10 MG/ML (PF) SYRINGE
PREFILLED_SYRINGE | INTRAVENOUS | Status: AC
  Filled 2021-06-10: qty 10

## 2021-06-10 MED ORDER — POLYETHYLENE GLYCOL 3350 17 G PO PACK: 17.0000 g | PACK | Freq: Every day | ORAL | Status: DC | PRN

## 2021-06-10 MED ORDER — OXYCODONE HCL 5 MG PO TABS: 5.0000 mg | ORAL_TABLET | ORAL | Status: DC | PRN

## 2021-06-10 MED ORDER — 0.9 % SODIUM CHLORIDE (POUR BTL) OPTIME
TOPICAL | Status: DC | PRN
  Administered 2021-06-10 (×2): 1000 mL

## 2021-06-10 MED ORDER — ONDANSETRON HCL 4 MG/2ML IJ SOLN: 4.0000 mg | Freq: Four times a day (QID) | INTRAMUSCULAR | Status: DC | PRN

## 2021-06-10 MED ORDER — HYDROMORPHONE HCL 1 MG/ML IJ SOLN: 0.2500 mg | INTRAMUSCULAR | Status: DC | PRN

## 2021-06-10 MED ORDER — ONDANSETRON HCL 4 MG/2ML IJ SOLN
INTRAMUSCULAR | Status: AC
  Filled 2021-06-10: qty 2

## 2021-06-10 MED ORDER — ACETAMINOPHEN 650 MG RE SUPP: 650.0000 mg | RECTAL | Status: DC | PRN

## 2021-06-10 MED ORDER — PREGABALIN 75 MG PO CAPS
75.0000 mg | ORAL_CAPSULE | Freq: Two times a day (BID) | ORAL | Status: DC
  Administered 2021-06-10: 75 mg via ORAL
  Filled 2021-06-10: qty 1

## 2021-06-10 MED ORDER — ONDANSETRON HCL 4 MG/2ML IJ SOLN: 4.0000 mg | Freq: Once | INTRAMUSCULAR | Status: DC | PRN

## 2021-06-10 MED ORDER — SODIUM CHLORIDE 0.9% FLUSH
3.0000 mL | Freq: Two times a day (BID) | INTRAVENOUS | Status: DC
  Administered 2021-06-10: 3 mL via INTRAVENOUS

## 2021-06-10 MED ORDER — DEXAMETHASONE SODIUM PHOSPHATE 10 MG/ML IJ SOLN
INTRAMUSCULAR | Status: AC
  Filled 2021-06-10: qty 1

## 2021-06-10 MED ORDER — KETOROLAC TROMETHAMINE 0.5 % OP SOLN
1.0000 [drp] | Freq: Four times a day (QID) | OPHTHALMIC | Status: DC
  Administered 2021-06-10: 1 [drp] via OPHTHALMIC
  Filled 2021-06-10: qty 5

## 2021-06-10 MED ORDER — AMISULPRIDE (ANTIEMETIC) 5 MG/2ML IV SOLN: 10.0000 mg | Freq: Once | INTRAVENOUS | Status: DC | PRN

## 2021-06-10 MED ORDER — FLEET ENEMA 7-19 GM/118ML RE ENEM: 1.0000 | ENEMA | Freq: Once | RECTAL | Status: DC | PRN

## 2021-06-10 MED ORDER — ACETAMINOPHEN 325 MG PO TABS
650.0000 mg | ORAL_TABLET | ORAL | Status: DC | PRN
  Administered 2021-06-11 (×2): 650 mg via ORAL
  Filled 2021-06-10 (×2): qty 2

## 2021-06-10 MED ORDER — FENTANYL CITRATE (PF) 250 MCG/5ML IJ SOLN
INTRAMUSCULAR | Status: AC
  Filled 2021-06-10: qty 5

## 2021-06-10 MED ORDER — BUPIVACAINE-EPINEPHRINE (PF) 0.25% -1:200000 IJ SOLN
INTRAMUSCULAR | Status: AC
  Filled 2021-06-10: qty 30

## 2021-06-10 MED ORDER — INSULIN ASPART 100 UNIT/ML IJ SOLN
0.0000 [IU] | Freq: Every day | INTRAMUSCULAR | Status: DC
  Administered 2021-06-10: 2 [IU] via SUBCUTANEOUS

## 2021-06-10 MED ORDER — OXYCODONE HCL 5 MG PO TABS
10.0000 mg | ORAL_TABLET | ORAL | Status: DC | PRN
  Administered 2021-06-10 – 2021-06-11 (×4): 10 mg via ORAL
  Filled 2021-06-10 (×4): qty 2

## 2021-06-10 MED ORDER — ACETAMINOPHEN 500 MG PO TABS
ORAL_TABLET | ORAL | Status: AC
  Administered 2021-06-10: 1000 mg via ORAL
  Filled 2021-06-10: qty 2

## 2021-06-10 MED ORDER — PROPOFOL 10 MG/ML IV BOLUS
INTRAVENOUS | Status: DC | PRN
Start: 2021-06-10 — End: 2021-06-10
  Administered 2021-06-10: 200 mg via INTRAVENOUS

## 2021-06-10 MED ORDER — TRANEXAMIC ACID-NACL 1000-0.7 MG/100ML-% IV SOLN
1000.0000 mg | INTRAVENOUS | Status: AC
  Administered 2021-06-10: 1000 mg via INTRAVENOUS
  Filled 2021-06-10: qty 100

## 2021-06-10 MED ORDER — OXYCODONE HCL 5 MG PO TABS: 5.0000 mg | ORAL_TABLET | Freq: Once | ORAL | Status: DC | PRN

## 2021-06-10 MED ORDER — METHOCARBAMOL 500 MG PO TABS: 500.0000 mg | ORAL_TABLET | Freq: Three times a day (TID) | ORAL | 0 refills | Status: AC | PRN

## 2021-06-10 MED ORDER — LOSARTAN POTASSIUM 50 MG PO TABS: 50.0000 mg | ORAL_TABLET | Freq: Every day | ORAL | Status: DC

## 2021-06-10 MED ORDER — LACTATED RINGERS IV SOLN: INTRAVENOUS | Status: DC

## 2021-06-10 MED ORDER — INSULIN ASPART 100 UNIT/ML IJ SOLN
0.0000 [IU] | Freq: Three times a day (TID) | INTRAMUSCULAR | Status: DC
  Administered 2021-06-10: 3 [IU] via SUBCUTANEOUS
  Administered 2021-06-11: 5 [IU] via SUBCUTANEOUS

## 2021-06-10 MED ORDER — MENTHOL 3 MG MT LOZG: 1.0000 | LOZENGE | OROMUCOSAL | Status: DC | PRN

## 2021-06-10 MED ORDER — THROMBIN 20000 UNITS EX SOLR
CUTANEOUS | Status: AC
  Filled 2021-06-10: qty 20000

## 2021-06-10 MED ORDER — CEFAZOLIN SODIUM-DEXTROSE 2-4 GM/100ML-% IV SOLN
2.0000 g | INTRAVENOUS | Status: AC
Start: 2021-06-10 — End: 2021-06-10
  Administered 2021-06-10 (×2): 2 g via INTRAVENOUS

## 2021-06-10 MED ORDER — METHOCARBAMOL 1000 MG/10ML IJ SOLN: 500.0000 mg | Freq: Four times a day (QID) | INTRAVENOUS | Status: DC | PRN

## 2021-06-10 MED ORDER — OXYCODONE-ACETAMINOPHEN 10-325 MG PO TABS: 1.0000 | ORAL_TABLET | Freq: Four times a day (QID) | ORAL | 0 refills | Status: AC | PRN

## 2021-06-10 MED ORDER — METFORMIN HCL 500 MG PO TABS
1000.0000 mg | ORAL_TABLET | Freq: Two times a day (BID) | ORAL | Status: DC
  Administered 2021-06-10 – 2021-06-11 (×2): 1000 mg via ORAL
  Filled 2021-06-10 (×2): qty 2

## 2021-06-10 MED ORDER — LIDOCAINE 2% (20 MG/ML) 5 ML SYRINGE
INTRAMUSCULAR | Status: DC | PRN
  Administered 2021-06-10: 80 mg via INTRAVENOUS

## 2021-06-10 MED ORDER — BUPIVACAINE-EPINEPHRINE 0.25% -1:200000 IJ SOLN
INTRAMUSCULAR | Status: DC | PRN
  Administered 2021-06-10: 10 mL

## 2021-06-10 MED ORDER — IPRATROPIUM-ALBUTEROL 0.5-2.5 (3) MG/3ML IN SOLN: 3.0000 mL | Freq: Four times a day (QID) | RESPIRATORY_TRACT | Status: DC | PRN

## 2021-06-10 MED ORDER — SUGAMMADEX SODIUM 200 MG/2ML IV SOLN
INTRAVENOUS | Status: DC | PRN
  Administered 2021-06-10: 200 mg via INTRAVENOUS

## 2021-06-10 MED ORDER — OXYCODONE HCL 5 MG/5ML PO SOLN: 5.0000 mg | Freq: Once | ORAL | Status: DC | PRN

## 2021-06-10 MED ORDER — THROMBIN 20000 UNITS EX SOLR
CUTANEOUS | Status: DC | PRN
  Administered 2021-06-10: 20 mL via TOPICAL

## 2021-06-10 MED ORDER — SODIUM CHLORIDE 0.9 % IV SOLN: 250.0000 mL | INTRAVENOUS | Status: DC

## 2021-06-10 MED ORDER — HYDROMORPHONE HCL 1 MG/ML IJ SOLN
1.0000 mg | INTRAMUSCULAR | Status: DC | PRN
  Administered 2021-06-10: 1 mg via INTRAVENOUS
  Filled 2021-06-10: qty 1

## 2021-06-10 MED ORDER — ONDANSETRON HCL 4 MG PO TABS: 4.0000 mg | ORAL_TABLET | Freq: Three times a day (TID) | ORAL | 0 refills | Status: DC | PRN

## 2021-06-10 MED ORDER — MIDAZOLAM HCL 2 MG/2ML IJ SOLN
INTRAMUSCULAR | Status: AC
  Filled 2021-06-10: qty 2

## 2021-06-10 MED ORDER — CEFAZOLIN SODIUM-DEXTROSE 1-4 GM/50ML-% IV SOLN
1.0000 g | Freq: Three times a day (TID) | INTRAVENOUS | Status: AC
  Administered 2021-06-10 (×2): 1 g via INTRAVENOUS
  Filled 2021-06-10 (×2): qty 50

## 2021-06-10 MED ORDER — SODIUM CHLORIDE 0.9% FLUSH: 3.0000 mL | INTRAVENOUS | Status: DC | PRN

## 2021-06-10 MED ORDER — ALBUTEROL SULFATE (2.5 MG/3ML) 0.083% IN NEBU: 3.0000 mL | INHALATION_SOLUTION | Freq: Four times a day (QID) | RESPIRATORY_TRACT | Status: DC | PRN

## 2021-06-10 MED ORDER — ARTIFICIAL TEARS OPHTHALMIC OINT: TOPICAL_OINTMENT | OPHTHALMIC | Status: DC | PRN

## 2021-06-10 MED ORDER — MONTELUKAST SODIUM 10 MG PO TABS
10.0000 mg | ORAL_TABLET | Freq: Every day | ORAL | Status: DC
  Administered 2021-06-10: 10 mg via ORAL
  Filled 2021-06-10: qty 1

## 2021-06-10 MED ORDER — METOPROLOL SUCCINATE ER 25 MG PO TB24: 12.5000 mg | ORAL_TABLET | Freq: Every day | ORAL | Status: DC

## 2021-06-10 MED ORDER — LACTATED RINGERS IV SOLN: INTRAVENOUS | Status: DC | PRN

## 2021-06-10 MED ORDER — KETOROLAC TROMETHAMINE 0.5 % OP SOLN
OPHTHALMIC | Status: AC
  Filled 2021-06-10: qty 5

## 2021-06-10 MED ORDER — FENTANYL CITRATE (PF) 250 MCG/5ML IJ SOLN
INTRAMUSCULAR | Status: DC | PRN
  Administered 2021-06-10: 100 ug via INTRAVENOUS
  Administered 2021-06-10 (×3): 50 ug via INTRAVENOUS

## 2021-06-10 MED ORDER — ROSUVASTATIN CALCIUM 20 MG PO TABS: 20.0000 mg | ORAL_TABLET | Freq: Every day | ORAL | Status: DC

## 2021-06-10 SURGICAL SUPPLY — 66 items
BAG COUNTER SPONGE SURGICOUNT (BAG) ×1 IMPLANT
BAND RUBBER #18 3X1/16 STRL (MISCELLANEOUS) IMPLANT
BLADE CLIPPER SURG (BLADE) IMPLANT
CABLE BIPOLOR RESECTION CORD (MISCELLANEOUS) ×2 IMPLANT
CANISTER SUCT 3000ML PPV (MISCELLANEOUS) ×2 IMPLANT
CLSR STERI-STRIP ANTIMIC 1/2X4 (GAUZE/BANDAGES/DRESSINGS) ×2 IMPLANT
COLLAR CERV LO CONTOUR FIRM DE (SOFTGOODS) IMPLANT
COVER MAYO STAND STRL (DRAPES) ×4 IMPLANT
COVER SURGICAL LIGHT HANDLE (MISCELLANEOUS) ×3 IMPLANT
DEVICE ENDSKLTN IMPL 16X14X7X6 (Cage) IMPLANT
DRAIN CHANNEL 15F RND FF W/TCR (WOUND CARE) IMPLANT
DRAPE C-ARM 42X72 X-RAY (DRAPES) ×2 IMPLANT
DRAPE POUCH INSTRU U-SHP 10X18 (DRAPES) ×1 IMPLANT
DRAPE SURG 17X23 STRL (DRAPES) ×2 IMPLANT
DRAPE U-SHAPE 47X51 STRL (DRAPES) ×2 IMPLANT
DRSG OPSITE POSTOP 4X6 (GAUZE/BANDAGES/DRESSINGS) ×2 IMPLANT
DURAPREP 26ML APPLICATOR (WOUND CARE) ×2 IMPLANT
ELECT COATED BLADE 2.86 ST (ELECTRODE) ×2 IMPLANT
ELECT PENCIL ROCKER SW 15FT (MISCELLANEOUS) ×2 IMPLANT
ELECT REM PT RETURN 9FT ADLT (ELECTROSURGICAL) ×2
ELECTRODE REM PT RTRN 9FT ADLT (ELECTROSURGICAL) ×1 IMPLANT
ENDOSKELETON IMPLANT 16X14X7X6 (Cage) ×6 IMPLANT
GLOVE BIO SURGEON STRL SZ 6.5 (GLOVE) ×2 IMPLANT
GLOVE BIOGEL PI IND STRL 6.5 (GLOVE) ×1 IMPLANT
GLOVE BIOGEL PI IND STRL 8.5 (GLOVE) ×1 IMPLANT
GLOVE BIOGEL PI INDICATOR 6.5 (GLOVE) ×1
GLOVE BIOGEL PI INDICATOR 8.5 (GLOVE) ×2
GLOVE SS BIOGEL STRL SZ 8.5 (GLOVE) ×1 IMPLANT
GLOVE SUPERSENSE BIOGEL SZ 8.5 (GLOVE) ×2
GOWN STRL REUS W/TWL 2XL LVL3 (GOWN DISPOSABLE) ×2 IMPLANT
KIT BASIN OR (CUSTOM PROCEDURE TRAY) ×2 IMPLANT
KIT TURNOVER KIT B (KITS) ×2 IMPLANT
NDL SPNL 18GX3.5 QUINCKE PK (NEEDLE) ×1 IMPLANT
NEEDLE SPNL 18GX3.5 QUINCKE PK (NEEDLE) ×2 IMPLANT
NS IRRIG 1000ML POUR BTL (IV SOLUTION) ×2 IMPLANT
PACK ORTHO CERVICAL (CUSTOM PROCEDURE TRAY) ×2 IMPLANT
PACK UNIVERSAL I (CUSTOM PROCEDURE TRAY) ×2 IMPLANT
PAD ARMBOARD 7.5X6 YLW CONV (MISCELLANEOUS) ×4 IMPLANT
PATTIES SURGICAL .5 X.5 (GAUZE/BANDAGES/DRESSINGS) ×1 IMPLANT
PIN DISTRACTION MAXCESS-C 14 (PIN) ×2 IMPLANT
PLATE ACP 1.9X58 3LVL (Plate) ×1 IMPLANT
POSITIONER HEAD DONUT 9IN (MISCELLANEOUS) ×2 IMPLANT
PUTTY DBM PROPEL MEDIUM (Putty) ×1 IMPLANT
SCREW ACP VA SD 3.5X15 (Screw) ×6 IMPLANT
SCREW VA ST 4.0X15 (Screw) ×2 IMPLANT
SPONGE INTESTINAL PEANUT (DISPOSABLE) ×2 IMPLANT
SPONGE SURGIFOAM ABS GEL 100 (HEMOSTASIS) ×2 IMPLANT
SPONGE T-LAP 4X18 ~~LOC~~+RFID (SPONGE) ×4 IMPLANT
SURGIFLO W/THROMBIN 8M KIT (HEMOSTASIS) ×2 IMPLANT
SUT BONE WAX W31G (SUTURE) ×2 IMPLANT
SUT MNCRL AB 3-0 PS2 27 (SUTURE) ×1 IMPLANT
SUT MNCRL+ AB 3-0 CT1 36 (SUTURE) IMPLANT
SUT MONOCRYL AB 3-0 CT1 36IN (SUTURE) ×2
SUT VIC AB 2-0 CT1 18 (SUTURE) ×2 IMPLANT
SUT VIC AB 3-0 54X BRD REEL (SUTURE) ×1 IMPLANT
SUT VIC AB 3-0 BRD 54 (SUTURE)
SYR BULB IRRIG 60ML STRL (SYRINGE) ×2 IMPLANT
SYR CONTROL 10ML LL (SYRINGE) ×2 IMPLANT
TAPE CLOTH 4X10 WHT NS (GAUZE/BANDAGES/DRESSINGS) ×2 IMPLANT
TAPE UMBILICAL 1/8 X36 TWILL (MISCELLANEOUS) ×2 IMPLANT
TAPE UMBILICAL COTTON 1/8X30 (MISCELLANEOUS) ×1 IMPLANT
TOWEL GREEN STERILE (TOWEL DISPOSABLE) ×2 IMPLANT
TOWEL GREEN STERILE FF (TOWEL DISPOSABLE) ×2 IMPLANT
TRAY FOLEY MTR SLVR 16FR STAT (SET/KITS/TRAYS/PACK) ×1 IMPLANT
TUBE CONNECTING 12X1/4 (SUCTIONS) ×1 IMPLANT
WATER STERILE IRR 1000ML POUR (IV SOLUTION) ×1 IMPLANT

## 2021-06-10 NOTE — Discharge Instructions (Addendum)
? ? ? ?Today you will be discharged from the hospital.  The purpose of the following handout is to help guide you over the next 2 weeks.  First and foremost, be sure you have a follow up appointment with Dr. Shon Baton 2 weeks from the time of your surgery to have your sutures removed.  Please call [facility] [phone] to schedule or confirm this appointment.   ? ? ? ?Brace ?You do not have to wear the collar while lying in bed or sitting in a high-backed chair, eating, sleeping or showering.  Other than these instances, you must wear the brace.  You may NOT wear the collar while driving a vehicle (see driving restrictions below).  It is advisable that you wear the collar in public places or while traveling in a car as a passenger.  Dr. Shon Baton will discuss further use of the collar at your 2 week postop visit. ? ?Wound Care ?You may SHOWER 5 days from the date of surgery.  Shower directly over the steri-strips.  DO NOT scrub or submerge (bath tub, swimming pool, hot tub, etc.) the area.  Pat to dry following your shower.  There is no need for additional dressings other than the steri-strips.  Allow the steri-strips to fall off on their own.  Once the strips have fallen off, you may leave the area undressed.  DO NOT apply lotion/cream/ointment to the area.  The wound must remain dry at all times other than while showering.  Dr. Shon Baton or his staff will remove your stiches at your first postop visit and give you additional instructions regarding wound care at that time.  ? ?Activity ?NO DRIVING FOR 2 WEEKS.  No lifting over 5 pounds (approximately a gallon of milk).  No bending, stooping, squatting or twisting.  No overhead activities.  We encourage you to walk (short distances and often throughout the day) as you can tolerate.  A good rule of thumb is to get up and move once or twice every hour.  You may go up and down stairs carefully.  As you continue to recover, Dr. Shon Baton will address and adjust  restrictions to your activities until no further restrictions are needed.  However, until your first postop visit, when Dr. Shon Baton can assess your recovery, you are to follow these instructions.  At the end of this document is a tentative outline of activities for up to 1 year.   ? ? ? ? ?Medication ?You will be discharged from the hospital with medication for pain, spasm, nausea and constipation.  You will be given enough medication to last until your first postop visit in 2 weeks.  Medications WILL NOT BE REFILLED EARLY; therefore, you are to take the medications only as directed.  If you have been given multiple prescriptions, please leave them with your pharmacy.  They can keep them on file for when you need them.  Medications that are lost or stolen WILL NOT be replaced.  We will address the need for continuing certain medications on an individual basis during your postop visit.  We ask that you avoid over the counter anti-inflammatory medications (Advil, Aleve, Motrin) for 3 months.   ? ?What you can expect following neck surgery... ?It is not uncommon to experience a sore throat or difficulty swallowing following neck surgery.  Cold liquids and soft foods are helpful in soothing this discomfort.  There is no specific diet that you are to follow after surgery, however, there are a few things  you should keep in mind to avoid unneeded discomfort.  Take small bites and eat slowly.  Chew your food thoroughly before swallowing.  ? ?It is not uncommon to experience incisional soreness or pain in the back of the neck, shoulders or between the shoulder blades.  These symptoms will slowly begin to resolve as you continue to recover, however, they can last for a few weeks.   ? ?It is not uncommon to experience INTERMITTENT arm pain following surgery.  This pain can mimic the arm pain you had prior to surgery.  As long as the pain resolves on its own and is not constant, there is no need to become alarmed.  ? ?When To  Call ?If you experience fever >101F, loss of bowel or bladder control, painful swelling in the lower extremities, constant (unresolving) arm pain.  If you experience any of these symptoms, please call [facility] [phone]. ? ?What's Next ?As mentioned earlier, you will follow up with Dr. Shon Baton in 2 weeks.  At that time, we will likely remove your stitches and discuss additional aspects of your recovery.  ? ? ?ACTIVITY GUIDELINES ANTERIOR CERVICAL DISECTOMY AND FUSION ? ?Activity Discharge 2 weeks 6 weeks 3 months 6 months 1 year  ?Shower 5 days        ?Submerge the wound  no no yes     ?Walking outdoors yes       ?Lifting 5 lbs yes       ?Climbing stairs yes       ?Cooking yes       ?Car rides (less than 30 minutes) yes       ?Car rides (greater than 30 minutes) no varies yes     ?Air travel no varies yes     ?Short outings (church, visits, etc...) yes       ?School no no yes     ?Driving a car no no varies yes    ?Light upper extremity exercises no no varies yes    ?Stationary bike no no yes     ?Swimming (no diving) no no no varies yes   ?Vacuuming, laundry, mopping no no no varies yes   ?Biking outdoors no no no no varies yes  ?Light jogging no no no varies yes   ?Low impact aerobics no no no varies yes   ?Non-contact sports (tennis, golf) no no no varies yes   ?Hunting (no tree climbing) no no no varies yes   ?Dancing (non-gymnastics) no no no varies yes   ?Down-hill skiing (experienced skier) no no no no yes   ?Down-hill skiing (novice) no no no no yes   ?Cross-country skiing no no no no yes   ?Horseback riding (noncompetitive)  no no no no yes   ?Horseback riding (competitive) no no no no varies yes  ?Gardening/landscaping no no no varies yes   ?House repairs no no no varies varies yes  ?Lifting up to 50 lbs no no no no varies yes  ?  ? ? ? ?RESTART PLAVIX Wednesday EVENING ? ? ?

## 2021-06-10 NOTE — Transfer of Care (Signed)
Immediate Anesthesia Transfer of Care Note ? ?Patient: Robert Rich ? ?Procedure(s) Performed: Anterior cervical discectomy and fusion Cervical four to seven (Spine Cervical) ? ?Patient Location: PACU ? ?Anesthesia Type:General ? ?Level of Consciousness: awake, alert  and oriented ? ?Airway & Oxygen Therapy: Patient Spontanous Breathing ? ?Post-op Assessment: Report given to RN and Post -op Vital signs reviewed and stable ? ?Post vital signs: Reviewed and stable ? ?Last Vitals: See PACU vitals ?Vitals Value Taken Time  ?BP    ?Temp    ?Pulse    ?Resp    ?SpO2    ? ? ?Last Pain:  ?Vitals:  ? 06/10/21 0855  ?TempSrc:   ?PainSc: 9   ?   ? ?  ? ?Complications: No notable events documented. ?

## 2021-06-10 NOTE — Brief Op Note (Signed)
06/10/2021 ? ?2:58 PM ? ?PATIENT:  Glory Buff  56 y.o. male ? ?PRE-OPERATIVE DIAGNOSIS:  degenerative cervical spondylitic radiculopathy ? ?POST-OPERATIVE DIAGNOSIS:  degenerative cervical spondylitic radiculopathy ? ?PROCEDURE:  Procedure(s): ?Anterior cervical discectomy and fusion Cervical four to seven (N/A) ? ?SURGEON:  Surgeon(s) and Role: ?   Venita Lick, MD - Primary ? ?PHYSICIAN ASSISTANT:  ? ?ASSISTANTS: none  ? ?ANESTHESIA:   general ? ?EBL:  50 mL  ? ?BLOOD ADMINISTERED:none ? ?DRAINS: none  ? ?LOCAL MEDICATIONS USED:  MARCAINE    ? ?SPECIMEN:  No Specimen ? ?DISPOSITION OF SPECIMEN:  N/A ? ?COUNTS:  YES ? ?TOURNIQUET:  * No tourniquets in log * ? ?DICTATION: .Dragon Dictation ? ?PLAN OF CARE: Admit for overnight observation ? ?PATIENT DISPOSITION:  PACU - hemodynamically stable. ?  ?

## 2021-06-10 NOTE — H&P (Signed)
? ? ? ?History: ?Clayburn presents today for evaluation of severe neck and radicular arm pain x4-1/2 months. Patient states that he started having severe neck and left radicular arm pain in November and has gotten progressively worse. He notes the only position of comfort is to hold the left upper extremity over his head or close to his chest flexed at the elbow.. Patient states their pain level is severe. He notes that the pain significantly interferes with activities of daily living as well as overall quality of life.  Plan on 3 level ACDF C4-7. ? ?Past Medical History:  ?Diagnosis Date  ? COPD (chronic obstructive pulmonary disease) (HCC)   ? Coronary artery disease   ? DM (diabetes mellitus) (HCC)   ? Dyspnea   ? Heart attack (HCC)   ? HLD (hyperlipidemia)   ? HTN (hypertension)   ? Obesity   ? Stroke [facility])   ? ? ?Allergies  ?Allergen Reactions  ? Asa [Aspirin] Hives  ? Zolmitriptan Nausea And Vomiting and Other (See Comments)  ?  Severe headaches  ? ? ?No current facility-administered medications on file prior to encounter.  ? ?Current Outpatient Medications on File Prior to Encounter  ?Medication Sig Dispense Refill  ? albuterol (VENTOLIN HFA) 108 (90 Base) MCG/ACT inhaler Inhale 1-2 puffs into the lungs every 6 (six) hours as needed for wheezing or shortness of breath.    ? ANORO ELLIPTA 62.5-25 MCG/ACT AEPB Inhale 1 puff into the lungs daily.    ? clopidogrel (PLAVIX) 75 MG tablet Take 1 tablet (75 mg total) by mouth daily. 30 tablet 2  ? cyclobenzaprine (FLEXERIL) 10 MG tablet Take 10 mg by mouth 3 (three) times daily as needed for muscle spasms.    ? FARXIGA 10 MG TABS tablet Take 10 mg by mouth daily.    ? folic acid (FOLVITE) 1 MG tablet Take 1 tablet (1 mg total) by mouth daily. 30 tablet 2  ? HYDROcodone-acetaminophen (NORCO/VICODIN) 5-325 MG tablet Take 1 tablet by mouth every 4 (four) hours as needed for pain.    ? insulin NPH-regular Human (NOVOLIN 70/30) (70-30) 100 UNIT/ML injection Inject 50 Units  into the skin 2 (two) times daily with a meal. (Patient taking differently: Inject 50 Units into the skin See admin instructions. Inject 50 units into the skin in the morning and 50 units in the evening if blood glucose is over 200) 10 mL 1  ? ipratropium-albuterol (DUONEB) 0.5-2.5 (3) MG/3ML SOLN Inhale 3 mLs into the lungs every 6 (six) hours as needed (shortness of breath).    ? losartan (COZAAR) 50 MG tablet Take 50 mg by mouth daily.    ? metFORMIN (GLUCOPHAGE) 1000 MG tablet Take 1 tablet (1,000 mg total) by mouth 2 (two) times daily with a meal. 60 tablet 2  ? metoprolol succinate (TOPROL-XL) 25 MG 24 hr tablet Take 1 tablet (25 mg total) by mouth daily. (Patient taking differently: Take 12.5 mg by mouth daily.) 30 tablet 2  ? montelukast (SINGULAIR) 10 MG tablet Take 1 tablet (10 mg total) by mouth at bedtime. 30 tablet 1  ? Multiple Vitamin (MULTIVITAMIN WITH MINERALS) TABS tablet Take 1 tablet by mouth daily. 30 tablet 2  ? pregabalin (LYRICA) 75 MG capsule Take 75 mg by mouth 2 (two) times daily.    ? rosuvastatin (CRESTOR) 20 MG tablet Take 20 mg by mouth daily.    ? thiamine 100 MG tablet Take 2.5 tablets (250 mg total) by mouth daily. (Patient taking differently:  Take 100 mg by mouth daily.) 30 tablet 2  ? albuterol (PROVENTIL) (2.5 MG/3ML) 0.083% nebulizer solution Take 3 mLs (2.5 mg total) by nebulization every 4 (four) hours as needed for wheezing or shortness of breath. (Patient not taking: Reported on 05/21/2021) 75 mL 12  ? atorvastatin (LIPITOR) 40 MG tablet Take 1 tablet (40 mg total) by mouth daily at 6 PM. (Patient not taking: Reported on 05/21/2021) 90 tablet 1  ? butalbital-acetaminophen-caffeine (FIORICET) 50-325-40 MG tablet Take 1 tablet by mouth every 6 (six) hours as needed for migraine. (Patient not taking: Reported on 05/21/2021) 20 tablet 0  ? ? ?Physical Exam: ?Caryn BeeKevin is a pleasant individual, who appears younger than their stated age. ? ?He is alert and orientated ?3. ? ?No shortness  of breath, chest pain. ? ?Abdomen is soft and non-tender, negative loss of bowel and bladder control, no rebound tenderness. ? ?Negative: skin lesions abrasions contusions ? ?Peripheral pulses: 2+ radial artery pulses bilaterally. ?LE compartments are: Soft and nontender. ? ?Gait pattern: Normal ? ?Assistive devices: None ? ?Neuro: Positive left Spurling's sign with reproduction of neuropathic left arm pain. Positive numbness and dysesthesias in the left C5, C6, and C7 dermatomes. 4+ out of 5 left deltoid and bicep strength. Remainder of the upper extremity is 5/5. Negative Hoffman test, no clonus, 1+ deep tendon reflexes that are symmetrical throughout the upper extremity ? ?Musculoskeletal: Severe neck pain and crepitus with range of motion and palpation. Occasional occipital headaches. ? ?Imaging: Loss of normal cervical lordosis is noted. Advanced degenerative disc disease C5-7. ? ?Cervical MRI: completed on 02/28/2021 was reviewed with the patient. [facility]; I have independently reviewed the images as well as the radiology report. No cord signal changes. Severe biforaminal stenosis C4-5, C5-6, and C6-7. Affecting the exiting C5, C6, and C7 nerve root respectively. No significant central stenosis. ? ? ?A/P: ?Summary: Caryn BeeKevin presents today for evaluation of severe neck and radicular arm pain x4-1/2 months. Patient states that he started having severe neck and left radicular arm pain in November and has gotten progressively worse. He notes the only position of comfort is to hold the left upper extremity over his head or close to his chest flexed at the elbow.. Patient states their pain level is severe. He notes that the pain significantly interferes with activities of daily living as well as overall quality of life. ? ?Diagnosis: Patient's clinical exam is consistent with cervical spondylitic radiculopathy. Fortunately he does not show signs of myelopathy. Based on the imaging studies he has  severe foraminal stenosis affecting the exiting C5, C6, and C7 nerve roots. Clinically he does have weakness in the deltoid and bicep which would correspond to the C5 and C6 nerve root. He also has numbness and dysesthesias in the C7 dermatome. ? ?At this point I have gone over treatment options with him and have explained the pathology to him. All of his questions were addressed. The patient does have pre-existing medical issues primarily COPD and a heart attack in 2021 requiring 2 stents. At this point he states that he would like to move forward with surgery we will require surgical clearance from his pulmonologist, cardiologist and primary care physician. ? ?Risks and benefits of surgery were discussed with the patient. These include: Infection, bleeding, death, stroke, paralysis, ongoing or worse pain, need for additional surgery, nonunion, leak of spinal fluid, adjacent segment degeneration requiring additional fusion surgery. Pseudoarthrosis (nonunion)requiring supplemental posterior fixation. Throat pain, swallowing difficulties, hoarseness or change  in voice.  ? ?Summary of Treatment Plan: ? ?1. Diagnosis: 3 level degenerative cervical spondylitic radiculopathy C4-7 ?2. Tobacco use: No longer using tobacco products ?3. Conservative treatment: ? Physical therapy: patient attempted physical therapy but this was not effective. ? Injection therapy: Patient had a trigger point injection in the cervical spine which was not helpful. ? Chiropractic treatment: None ?4. Imaging studies: ? X-ray findings: Degenerative cervical disc disease C5-7 with loss of normal cervical lordosis. ? MRI/CT scan findings: No cord signal changes. Severe biforaminal stenosis C4-5, C5-6, and C6-7. Affecting the exiting C5, C6, and C7 nerve root respectively. No significant central stenosis. ?5. Surgical plan: ACDF C5-7 for cervical spondylitic radiculopathy ?6. Implants: Manufacturer: [employer] ACP plate, Titan intervertebral cages ? ?

## 2021-06-10 NOTE — Progress Notes (Signed)
Patient stated he took Metformin, Farxiga, and Losartan on day of surgery.   ? ?Dr. Donavan Foil notified.  Per Dr. Donavan Foil, do not follow diabetes protocol. ?

## 2021-06-10 NOTE — Op Note (Signed)
OPERATIVE REPORT ? ?DATE OF SURGERY: 06/10/2021 ? ?PATIENT NAME:  Robert Rich ?MRN: [id number] ?DOB: 19-Nov-1965 ? ?PCP: Rebecka Apley, NP ? 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 ? ?POST-OPERATIVE DIAGNOSIS: Same ? ?PROCEDURE:   ?ACDF C4-7 ? ?SURGEON:  Venita Lick, MD ? ?PHYSICIAN ASSISTANT: None ? ?ANESTHESIA:   General ? ?EBL: 50 ml  ? ?Complications: None ? ?Implants: NuVasive ACP cervical plate.  58 mm length.  15 mm locking screws.  Medtronic/Titan intervertebral cage: 7 mm medium lordotic x3. ? ?Graft: DPM Propel ? ?BRIEF HISTORY: ?Robert Rich is a 56 y.o. male who was in my office with complaints of severe neck and radicular left arm pain.  Attempts at conservative management had failed to alleviate his pain and improve his quality of life.  Patient was noted to have left radicular arm pain and dysesthesias in the C5, C6, and C7 dermatome.  Imaging studies confirmed severe foraminal stenosis C4-7 and affecting the exiting nerve root.  Because of the neurological deficits and radicular pain and the failure of conservative management to improve his quality of life we elected to move forward with surgery.  All appropriate risks, benefits, and alternatives were discussed with the patient and consent was obtained ? ?PROCEDURE DETAILS: ?Patient was brought into the operating room and was properly positioned on the operating room table.  After induction with general anesthesia the patient was endotracheally intubated.  A timeout was taken to confirm all important data: including patient, procedure, and the level. Teds, SCD's were applied.  ? ?A Foley was inserted by the nurse and the anterior cervical spine was prepped and draped in a standard fashion.  Using fluoroscopy I marked out the C4 and C7 levels.  Due to his body habitus I elected to use a standard tumor Smith-Robinson incision.  This would provide  better overall visualization and decrease retraction on the esophagus.  A longitudinal incision was made in line with the sternocleidomastoid and sharp dissection was carried out down to and through the platysma.  I continued dissecting through the deep cervical fascia was able to isolate the omohyoid muscle.  Once isolated I sacrificed this to improve visualization.  I continued dissecting sharply through the deep cervical and prevertebral fascia until I could visualize the anterior longitudinal ligament.  Using Kitner dissectors I then mobilized the esophagus over to the right from the inferior aspect of C3 to the inferior aspect of C7.  A needle was placed into the C4-5 disc space and a lateral fluoroscopy image was taken to confirm I was at the appropriate level.  Once confirmed I marked the disc space and then released the longus coli muscle from the superior aspect of C4 to the inferior aspect of C7.  This was done bilaterally.  Once I had this released I then placed my Caspar retractor blades underneath the longus coli muscle deflated the endotracheal cuff and expanded the retractor to the appropriate width.  With the C6-7 disc space visualized I proceeded with the ACDF. ? ?An annulotomy was performed and the bulk of the disc material was removed with pituitary rongeurs.  Using curettes and Kerrison rongeurs I continued to resect the rest of the disc anterior and remove the cartilaginous endplate.  Distraction pins were placed into the body of C6 and C7 and I gently distracted the intervertebral space with a lamina spreader and maintained this with the distraction pin set.  I then used a  spine nerve hook to continue dissecting posteriorly and I was able to resect the posterior annulus.  I then dissected and created a plane between the posterior longitudinal ligament and the thecal sac.  Using a 1 mm Kerrison rongeur I resected the posterior longitudinal ligament.  This allowed me to drop my Kerrison  underneath the uncovertebral joint and resect the osteophyte that was causing the foraminal stenosis.  At this point I could freely pass my nerve hook underneath the vertebral body of C6 and C7 and under the uncovertebral joints.  At this point with the discectomy and decompression complete I rasped the endplates and to light bleeding subchondral bone.  I then used my trial devices and elected to use the 7 mm medium lordotic cage.  The cage was obtained and packed with the allograft and then malleted to the appropriate depth.  Once I had excellent fixation of the intervertebral space I then repositioned my retractors to expose the C5-6 disc space. ? ?I used the exact same technique to perform a discectomy at this level.  Again the posterior longitudinal ligament was taken down with a 1 mm Kerrison rongeur allowing me to decompress the bone spurs from the uncovertebral joint.  I rasped the endplates until I had bleeding subchondral bone I then measured and placed a 7 mm medium lordotic cage.  The cage was also packed with bone graft and malleted to the appropriate depth.  Distraction pins were repositioned to expose the C4-5 level and using the exact same technique as used at the other levels I performed a discectomy at this level.  I again took down the steer longitudinal ligament and trim the uncovertebral joint osteophytes to adequately decompress the nerve.  I rasped the endplates trialed and placed a 7 mm lordotic cage.  At this point all 3 intervertebral cages were placed and had excellent fixation.  I irrigated the wound copiously with normal saline and obtain hemostasis.  Distraction pins were removed and the bleeding bone edges were sealed with bone wax. ? ?The appropriate size plate was contoured and then the remaining graft was used to place anterior to the cage to serve as a sentinel fusion.  I then placed the plate and secured it with 15 mm locking screws into the bodies of C4, 5, 6 and 7.  All 8 screws  had excellent purchase.  Once the screws were final tightened I engaged the locking mechanism per manufacture standards.  I again irrigated copiously with normal saline and made sure that hemostasis and then returned the trachea and esophagus to midline.  I did check the length of the plate to ensure that the esophagus did not get inadvertently entrapped beneath the plate.  With the trach and esophagus midline and closed platysma with interrupted 2-0 Vicryl suture and the skin with 3-0 Monocryl.  Final x-rays were taken which demonstrate satisfactory overall alignment and positioning of the intervertebral cages in the cervical plate.  Steri-Strips and a dry dressing were applied as was the cervical collar.  Patient was ultimately extubated transfer the PACU without incident.  The end of the case all needle and sponge counts were correct. ? ?Melina Schools, MD ?06/10/2021 ?2:46 PM ? ? ?

## 2021-06-10 NOTE — Anesthesia Procedure Notes (Signed)
Procedure Name: Intubation ?Date/Time: 06/10/2021 10:58 AM ?Performed by: Michele Rockers, CRNA ?Pre-anesthesia Checklist: Patient identified, Patient being monitored, Timeout performed, Emergency Drugs available and Suction available ?Patient Re-evaluated:Patient Re-evaluated prior to induction ?Oxygen Delivery Method: Circle system utilized ?Preoxygenation: Pre-oxygenation with 100% oxygen ?Induction Type: IV induction ?Ventilation: Mask ventilation without difficulty and Oral airway inserted - appropriate to patient size ?Laryngoscope Size: Mac, 3 and Glidescope ?Grade View: Grade I ?Tube type: Oral ?Tube size: 7.5 mm ?Number of attempts: 1 ?Airway Equipment and Method: Stylet ?Placement Confirmation: ETT inserted through vocal cords under direct vision, positive ETCO2 and breath sounds checked- equal and bilateral ?Secured at: 22 cm ?Tube secured with: Tape ?Dental Injury: Teeth and Oropharynx as per pre-operative assessment  ?Difficulty Due To: Difficult Airway- due to reduced neck mobility ? ? ? ? ?

## 2021-06-11 DIAGNOSIS — M50121 Cervical disc disorder at C4-C5 level with radiculopathy: Secondary | ICD-10-CM | POA: Diagnosis not present

## 2021-06-11 LAB — GLUCOSE, CAPILLARY: Glucose-Capillary: 225 mg/dL — ABNORMAL HIGH (ref 70–99)

## 2021-06-11 NOTE — Progress Notes (Signed)
Patient alert and oriented, mae's well, voiding adequate amount of urine, swallowing without difficulty, no c/o pain at time of discharge. Patient discharged home with family. Script and discharged instructions given to patient. Patient and family stated understanding of instructions given. Patient has an appointment with Dr. Brooks in 2 weeks 

## 2021-06-11 NOTE — Progress Notes (Signed)
PT Cancellation Note ? ?Patient Details ?Name: Robert Rich ?MRN: [id number] ?DOB: August 23, 1965 ? ? ?Cancelled Treatment:    Reason Eval/Treat Not Completed: PT screened, no needs identified, will sign off. Per OT pt functioning mod I without AD in hallway and room.. Pt with no further acute PT needs at this time. PT SIGNING OFF. ? ?Kittie Plater, PT, DPT ?Acute Rehabilitation Services ?Secure chat preferred ?Office #: [phone] ? ? ? ?Khylan Sawyer M Edder Bellanca ?06/11/2021, 8:08 AM ?

## 2021-06-11 NOTE — Evaluation (Addendum)
Occupational Therapy Evaluation ?Patient Details ?Name: Robert Rich ?MRN: [id number] ?DOB: [birth year] ?Today's Date: 06/11/2021 ? ? ?History of Present Illness Pt is a 56 y/o M s/p ACDF C4-7. PMH includes COPD CAD, DM, dyspnea, HLD, and HTN.  ? ?Clinical Impression ?  ?Pt independent at baseline with ADLs and functional mobility. Pt completing ADLs with mod I during session, using compensatory strategies when given demonstration. Pt mod I for transfers and bed mobility using log rolling technique. Pt educated on compensatory strategies for ADLs, bed mobility, along with precautions and cervical collar wear schedule and how to don/doff. Pt able to verbalize/demo understanding during session. Pt presenting with impairments listed below, will follow acutely. Recommend d/c home with family assistance.  ?   ? ?Recommendations for follow up therapy are one component of a multi-disciplinary discharge planning process, led by the attending physician.  Recommendations may be updated based on patient status, additional functional criteria and insurance authorization.  ? ?Follow Up Recommendations ? No OT follow up  ?  ?Assistance Recommended at Discharge Set up Supervision/Assistance  ?Patient can return home with the following A little help with bathing/dressing/bathroom;Assistance with cooking/housework;Assist for transportation ? ?  ?Functional Status Assessment ? Patient has had a recent decline in their functional status and demonstrates the ability to make significant improvements in function in a reasonable and predictable amount of time.  ?Equipment Recommendations ? None recommended by OT  ?  ?Recommendations for Other Services   ? ? ?  ?Precautions / Restrictions Precautions ?Precautions: Cervical ?Precaution Booklet Issued: Yes (comment) ?Precaution Comments: educated pt on 3/3 precautions ?Required Braces or Orthoses: Cervical Brace ?Cervical Brace: Hard collar;At all times ?Restrictions ?Weight Bearing  Restrictions: No  ? ?  ? ?Mobility Bed Mobility ?Overal bed mobility: Modified Independent ?  ?  ?  ?  ?  ?  ?General bed mobility comments: use of log roll technique ?  ? ?Transfers ?Overall transfer level: Modified independent ?Equipment used: None ?  ?  ?  ?  ?  ?  ?  ?  ?  ? ?  ?Balance Overall balance assessment: No apparent balance deficits (not formally assessed) ?  ?  ?  ?  ?  ?  ?  ?  ?  ?  ?  ?  ?  ?  ?  ?  ?  ?  ?   ? ?ADL either performed or assessed with clinical judgement  ? ?ADL Overall ADL's : Modified independent ?Eating/Feeding: Sitting;Modified independent ?  ?Grooming: Sitting;Modified independent ?  ?  ?  ?  ?  ?  ?  ?  ?  ?  ?  ?  ?  ?  ?  ?  ?General ADL Comments: pt able to perform UB/LB dressing, simulated shower transfer, toilet transfer, and standing grooming task demo'ing good use of compensatory strategies with mod I  ? ? ? ?Vision   ?Vision Assessment?: No apparent visual deficits  ?   ?Perception   ?  ?Praxis   ?  ? ?Pertinent Vitals/Pain Pain Assessment ?Pain Assessment: No/denies pain  ? ? ? ?Hand Dominance   ?  ?Extremity/Trunk Assessment Upper Extremity Assessment ?Upper Extremity Assessment: Overall WFL for tasks assessed ?  ?Lower Extremity Assessment ?Lower Extremity Assessment: Overall WFL for tasks assessed ?  ?Cervical / Trunk Assessment ?Cervical / Trunk Assessment: Neck Surgery ?  ?Communication Communication ?Communication: No difficulties ?  ?Cognition Arousal/Alertness: Awake/alert ?Behavior During Therapy: [facility] for tasks assessed/performed ?Overall Cognitive Status: Within  Functional Limits for tasks assessed ?  ?  ?  ?  ?  ?  ?  ?  ?  ?  ?  ?  ?  ?  ?  ?  ?  ?  ?  ?General Comments  VSS on RA ? ?  ?Exercises   ?  ?Shoulder Instructions    ? ? ?Home Living Family/patient expects to be discharged to:: Private residence ?Living Arrangements: Spouse/significant other ?Available Help at Discharge: Family ?Type of Home: House ?Home Access: Level entry ?  ?  ?Home Layout: One  level ?  ?  ?Bathroom Shower/Tub: Tub/shower unit;Curtain ?  ?Bathroom Toilet: Standard ?  ?  ?Home Equipment: None ?  ?  ?  ? ?  ?Prior Functioning/Environment Prior Level of Function : Independent/Modified Independent;Driving ?  ?  ?  ?  ?  ?  ?Mobility Comments: no AD use ?ADLs Comments: does IADLs ?  ? ?  ?  ?OT Problem List: Decreased range of motion;Decreased activity tolerance ?  ?   ?OT Treatment/Interventions:    ?  ?OT Goals(Current goals can be found in the care plan section) Acute Rehab OT Goals ?Patient Stated Goal: to go home ?OT Goal Formulation: With patient ?Time For Goal Achievement: 06/25/21 ?Potential to Achieve Goals: Good  ?OT Frequency:   ?  ? ?Co-evaluation   ?  ?  ?  ?  ? ?  ?AM-PAC OT "6 Clicks" Daily Activity     ?Outcome Measure Help from another person eating meals?: None ?Help from another person taking care of personal grooming?: None ?Help from another person toileting, which includes using toliet, bedpan, or urinal?: None ?Help from another person bathing (including washing, rinsing, drying)?: A Little ?Help from another person to put on and taking off regular upper body clothing?: None ?Help from another person to put on and taking off regular lower body clothing?: A Little ?6 Click Score: 22 ?  ?End of Session Equipment Utilized During Treatment: Cervical collar ?Nurse Communication: Mobility status ? ?Activity Tolerance: Patient tolerated treatment well ?Patient left: in bed;with call bell/phone within reach;with family/visitor present ? ?OT Visit Diagnosis: Unsteadiness on feet (R26.81);Other abnormalities of gait and mobility (R26.89);Muscle weakness (generalized) (M62.81)  ?              ?Time: 2330-0762 ?OT Time Calculation (min): 16 min ?Charges:  OT General Charges ?$OT Visit: 1 Visit ?OT Evaluation ?$OT Eval Low Complexity: 1 Low ? ?Robert Rich, OTD, OTR/L ?Acute Rehab ?[phone] ? ?Robert Rich ?06/11/2021, 9:19 AM ?

## 2021-06-11 NOTE — Discharge Summary (Signed)
? ?Patient ID: ?Robert Rich ?MRN: [id number] ?DOB/AGE: Aug 25, 1965 56 y.o. ? ?Admit date: 06/10/2021 ?Discharge date: 06/11/2021 ? ?Admission Diagnoses:  ?Principal Problem: ?  Cervical disc herniation ? ? ?Discharge Diagnoses:  ?Principal Problem: ?  Cervical disc herniation ? status post Procedure(s): ?Anterior cervical discectomy and fusion Cervical four to seven ? ?Past Medical History:  ?Diagnosis Date  ? COPD (chronic obstructive pulmonary disease) (HCC)   ? Coronary artery disease   ? DM (diabetes mellitus) (HCC)   ? Dyspnea   ? Heart attack (HCC)   ? HLD (hyperlipidemia)   ? HTN (hypertension)   ? Obesity   ? Stroke [facility])   ? ? ?Surgeries: Procedure(s): ?Anterior cervical discectomy and fusion Cervical four to seven on 06/10/2021 ?  ?Consultants:  ? ?Discharged Condition: Improved ? ?Hospital Course: Robert Rich is an 56 y.o. male who was admitted 06/10/2021 for operative treatment of Cervical disc herniation. Patient failed conservative treatments (please see the history and physical for the specifics) and had severe unremitting pain that affects sleep, daily activities and work/hobbies. After pre-op clearance, the patient was taken to the operating room on 06/10/2021 and underwent  Procedure(s): ?Anterior cervical discectomy and fusion Cervical four to seven.   ? ?Patient was given perioperative antibiotics:  ?Anti-infectives (From admission, onward)  ? ? Start     Dose/Rate Route Frequency Ordered Stop  ? 06/10/21 1645  ceFAZolin (ANCEF) IVPB 1 g/50 mL premix       ? 1 g ?100 mL/hr over 30 Minutes Intravenous Every 8 hours 06/10/21 1637 06/10/21 2335  ? 06/10/21 0841  ceFAZolin (ANCEF) 2-4 GM/100ML-% IVPB       ?Note to Pharmacy: Derry Skill: cabinet override  ?    06/10/21 0841 06/10/21 1052  ? 06/10/21 0836  ceFAZolin (ANCEF) IVPB 2g/100 mL premix       ? 2 g ?200 mL/hr over 30 Minutes Intravenous 30 min pre-op 06/10/21 0836 06/10/21 1436  ? ?  ?  ? ?Patient was given sequential compression devices  and early ambulation to prevent DVT.  ? ?Patient benefited maximally from hospital stay and there were no complications. At the time of discharge, the patient was urinating/moving their bowels without difficulty, tolerating a regular diet, pain is controlled with oral pain medications and they have been cleared by PT/OT.  ? ?Recent vital signs: Patient Vitals for the past 24 hrs: ? BP Temp Temp src Pulse Resp SpO2 Height Weight  ?06/11/21 0406 (!) 144/85 98.2 ?F (36.8 ?C) Oral 79 18 95 % -- --  ?06/10/21 2305 (!) 146/93 98.3 ?F (36.8 ?C) Oral 82 20 95 % -- --  ?06/10/21 1926 (!) 147/79 98.5 ?F (36.9 ?C) Oral 90 20 95 % -- --  ?06/10/21 1651 (!) 144/90 -- -- 88 18 96 % -- --  ?06/10/21 1611 (!) 151/89 -- -- 88 17 93 % -- --  ?06/10/21 1541 (!) 147/94 98.9 ?F (37.2 ?C) -- 86 19 94 % -- --  ?06/10/21 1526 (!) 141/86 -- -- 88 18 94 % -- --  ?06/10/21 1511 (!) 141/90 -- -- 92 17 94 % -- --  ?06/10/21 1456 (!) 142/87 98.1 ?F (36.7 ?C) -- 93 16 94 % -- --  ?06/10/21 0840 124/74 98.8 ?F (37.1 ?C) Oral 83 17 98 % 5\' 9"  (1.753 m) 104.3 kg  ?  ? ?Recent laboratory studies: No results for input(s): WBC, HGB, HCT, PLT, NA, K, CL, CO2, BUN, CREATININE, GLUCOSE, INR, CALCIUM in the last  72 hours. ? ?Invalid input(s): PT, 2 ? ? ?Discharge Medications:   ?Allergies as of 06/11/2021   ? ?   Reactions  ? Asa [aspirin] Hives  ? Zolmitriptan Nausea And Vomiting, Other (See Comments)  ? Severe headaches  ? ?  ? ?  ?Medication List  ?  ? ?STOP taking these medications   ? ?atorvastatin 40 MG tablet ?Commonly known as: LIPITOR ?  ?butalbital-acetaminophen-caffeine 50-325-40 MG tablet ?Commonly known as: FIORICET ?  ?clopidogrel 75 MG tablet ?Commonly known as: PLAVIX ?  ?cyclobenzaprine 10 MG tablet ?Commonly known as: FLEXERIL ?  ?HYDROcodone-acetaminophen 5-325 MG tablet ?Commonly known as: NORCO/VICODIN ?  ?multivitamin with minerals Tabs tablet ?  ? ?  ? ?TAKE these medications   ? ?albuterol 108 (90 Base) MCG/ACT inhaler ?Commonly  known as: VENTOLIN HFA ?Inhale 1-2 puffs into the lungs every 6 (six) hours as needed for wheezing or shortness of breath. ?What changed: Another medication with the same name was removed. Continue taking this medication, and follow the directions you see here. ?  ?Anoro Ellipta 62.5-25 MCG/ACT Aepb ?Generic drug: umeclidinium-vilanterol ?Inhale 1 puff into the lungs daily. ?  ?Farxiga 10 MG Tabs tablet ?Generic drug: dapagliflozin propanediol ?Take 10 mg by mouth daily. ?  ?folic acid 1 MG tablet ?Commonly known as: FOLVITE ?Take 1 tablet (1 mg total) by mouth daily. ?  ?ipratropium-albuterol 0.5-2.5 (3) MG/3ML Soln ?Commonly known as: DUONEB ?Inhale 3 mLs into the lungs every 6 (six) hours as needed (shortness of breath). ?  ?losartan 50 MG tablet ?Commonly known as: COZAAR ?Take 50 mg by mouth daily. ?  ?metFORMIN 1000 MG tablet ?Commonly known as: Glucophage ?Take 1 tablet (1,000 mg total) by mouth 2 (two) times daily with a meal. ?  ?methocarbamol 500 MG tablet ?Commonly known as: ROBAXIN ?Take 1 tablet (500 mg total) by mouth every 8 (eight) hours as needed for up to 5 days for muscle spasms. ?  ?metoprolol succinate 25 MG 24 hr tablet ?Commonly known as: TOPROL-XL ?Take 1 tablet (25 mg total) by mouth daily. ?What changed: how much to take ?  ?montelukast 10 MG tablet ?Commonly known as: SINGULAIR ?Take 1 tablet (10 mg total) by mouth at bedtime. ?  ?NovoLIN 70/30 (70-30) 100 UNIT/ML injection ?Generic drug: insulin NPH-regular Human ?Inject 50 Units into the skin 2 (two) times daily with a meal. ?What changed:  ?when to take this ?additional instructions ?  ?ondansetron 4 MG tablet ?Commonly known as: Zofran ?Take 1 tablet (4 mg total) by mouth every 8 (eight) hours as needed for nausea or vomiting. ?  ?oxyCODONE-acetaminophen 10-325 MG tablet ?Commonly known as: Percocet ?Take 1 tablet by mouth every 6 (six) hours as needed for up to 5 days for pain. ?  ?pregabalin 75 MG capsule ?Commonly known as:  LYRICA ?Take 75 mg by mouth 2 (two) times daily. ?  ?rosuvastatin 20 MG tablet ?Commonly known as: CRESTOR ?Take 20 mg by mouth daily. ?  ?thiamine 100 MG tablet ?Take 2.5 tablets (250 mg total) by mouth daily. ?What changed: how much to take ?  ? ?  ? ? ?Diagnostic Studies: DG Cervical Spine 2 or 3 views ? ?Result Date: 06/10/2021 ?CLINICAL DATA:  ACDF C-spine EXAM: CERVICAL SPINE - 2-3 VIEW COMPARISON:  None Available. FINDINGS: Fluoro time: 49 seconds Reported radiation: 18.58 mGy Five C-arm fluoroscopic images were obtained intraoperatively and submitted for post operative interpretation. These images demonstrate an ACF spanning from C4 to C7 with intervening spacers. Please  see the performing provider's procedural report for further detail. IMPRESSION: Intraoperative fluoroscopy, as detailed above. Electronically Signed   By: Feliberto HartsFrederick S Jones M.D.   On: 06/10/2021 14:46  ? ?DG C-Arm 1-60 Min-No Report ? ?Result Date: 06/10/2021 ?Fluoroscopy was utilized by the requesting physician.  No radiographic interpretation.  ? ?DG C-Arm 1-60 Min-No Report ? ?Result Date: 06/10/2021 ?Fluoroscopy was utilized by the requesting physician.  No radiographic interpretation.  ? ?DG C-Arm 1-60 Min-No Report ? ?Result Date: 06/10/2021 ?Fluoroscopy was utilized by the requesting physician.  No radiographic interpretation.  ? ?DG C-Arm 1-60 Min-No Report ? ?Result Date: 06/10/2021 ?Fluoroscopy was utilized by the requesting physician.  No radiographic interpretation.   ? ?Discharge Instructions   ? ? Incentive spirometry RT   Complete by: As directed ?  ? ?  ? ? ? Follow-up Information   ? ? Venita LickBrooks, Merwin Breden, MD. Schedule an appointment as soon as possible for a visit in 2 week(s).   ?Specialty: Orthopedic Surgery ?Why: If symptoms worsen, For suture removal, For wound re-check ?Contact information: ?[address] ?[address] ?[place] [address] ?[phone] ? ? ?  ?  ? ?  ?  ? ?  ? ? ?Discharge Plan:  discharge to  home ? ?Disposition:  ?Patient doing quite well 1 day status post 3 level ACDF.  He is tolerating a regular diet and denies any significant dysphagia.  No shortness of breath or chest pain and his wound is clean dry and intact th

## 2021-06-11 NOTE — Anesthesia Postprocedure Evaluation (Signed)
Anesthesia Post Note ? ?Patient: CADEN BAXENDALE ? ?Procedure(s) Performed: Anterior cervical discectomy and fusion Cervical four to seven (Spine Cervical) ? ?  ? ?Patient location during evaluation: PACU ?Anesthesia Type: General ?Level of consciousness: awake and alert ?Pain management: pain level controlled ?Vital Signs Assessment: post-procedure vital signs reviewed and stable ?Respiratory status: spontaneous breathing, nonlabored ventilation and respiratory function stable ?Cardiovascular status: blood pressure returned to baseline and stable ?Postop Assessment: no apparent nausea or vomiting ?Anesthetic complications: yes ?Comments: Patient with corneal abrasion in PACU.  Treated with Ketorolac drops ? ? ?No notable events documented. ? ?Last Vitals:  ?Vitals:  ? 06/10/21 2305 06/11/21 0406  ?BP: (!) 146/93 (!) 144/85  ?Pulse: 82 79  ?Resp: 20 18  ?Temp: 36.8 ?C 36.8 ?C  ?SpO2: 95% 95%  ?  ?Last Pain:  ?Vitals:  ? 06/11/21 0701  ?TempSrc:   ?PainSc: 3   ? ? ?  ?  ?  ?  ?  ?  ? ?Lynda Rainwater ? ? ? ? ?

## 2021-06-12 ENCOUNTER — Encounter (HOSPITAL_COMMUNITY): Payer: Self-pay | Admitting: Orthopedic Surgery

## 2021-06-14 NOTE — Progress Notes (Signed)
Unfamiliar with the concept of load bearing cervical plate/screws.  Can you please provide additional information so I can properly address this question.

## 2021-06-14 NOTE — Progress Notes (Signed)
The plate is used to augment the overall stability of the construct to improve fusion rates.

## 2021-06-25 IMAGING — CT CT ANGIO CHEST
3 of 7 series · 19 of 36 positions shown · IV contrast (omnipaque)
Comparison: None.

CLINICAL DATA: Shortness of breath. Respiratory distress. Pulseless
CPR.

EXAM:
CT ANGIOGRAPHY CHEST WITH CONTRAST
TECHNIQUE: Multidetector CT imaging of the chest was performed using the
standard protocol during bolus administration of intravenous
contrast. Multiplanar CT image reconstructions and MIPs were
obtained to evaluate the vascular anatomy.
CONTRAST:  100mL OMNIPAQUE IOHEXOL 350 MG/ML SOLN

[Series 4: thins · axial · 0.78mm/px · z∈[-544,-276]mm · 15 of 308 slices shown]
[im 20/308  lung]
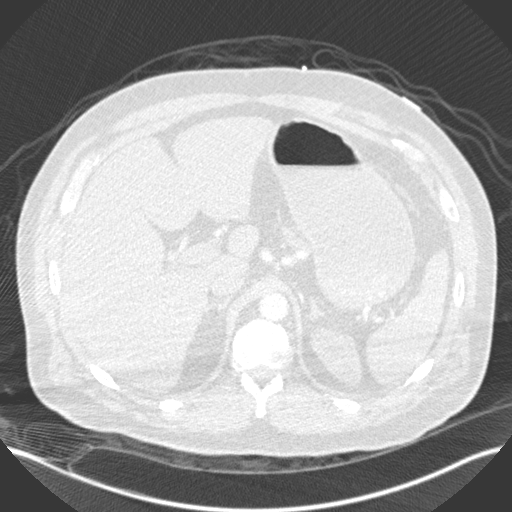
[im 39/308  mediastinal]
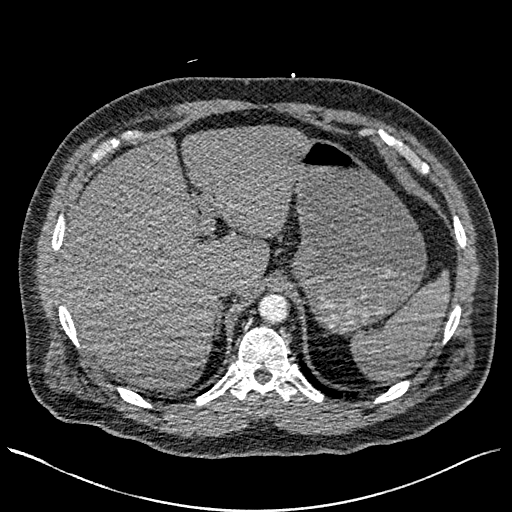
[im 58/308  lung]
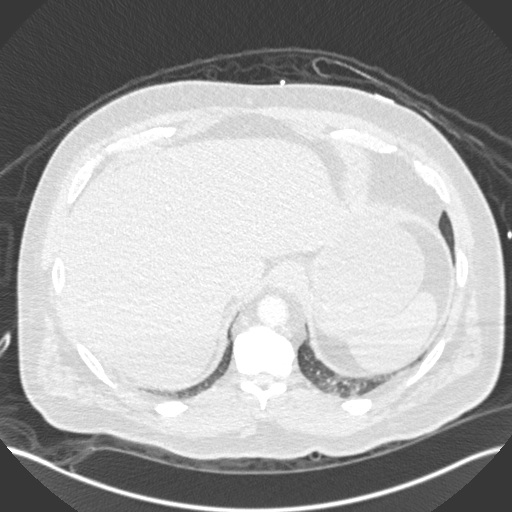
[im 77/308  mediastinal]
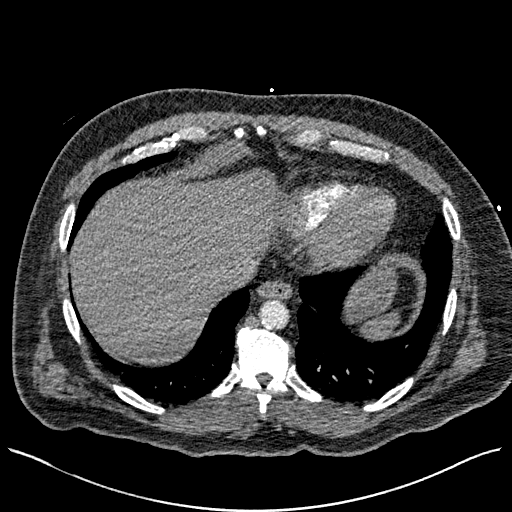
[im 96/308  lung]
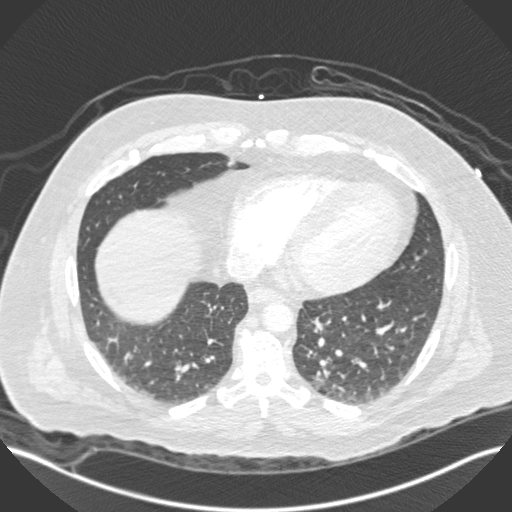
[im 116/308  mediastinal]
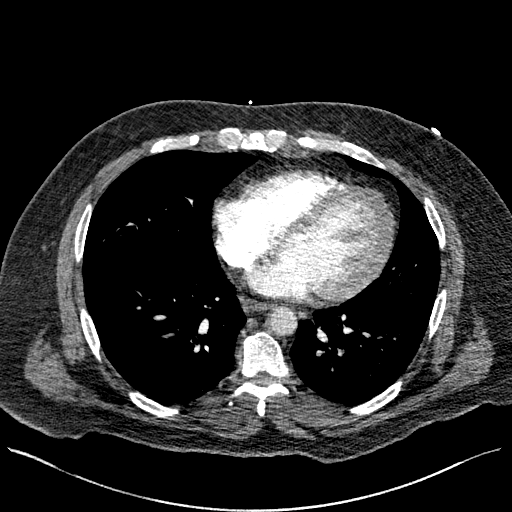
[im 135/308  lung]
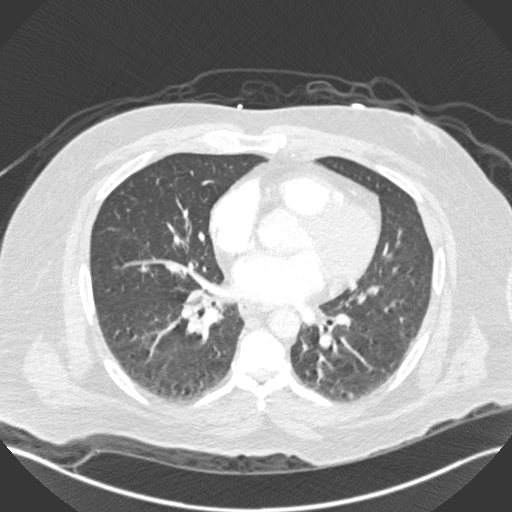
[im 154/308  mediastinal]
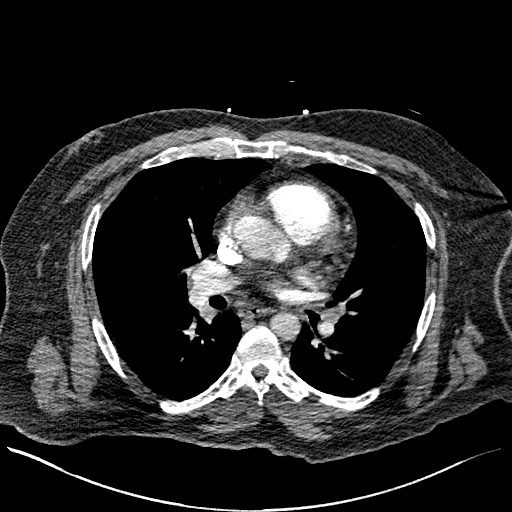
[im 173/308  lung]
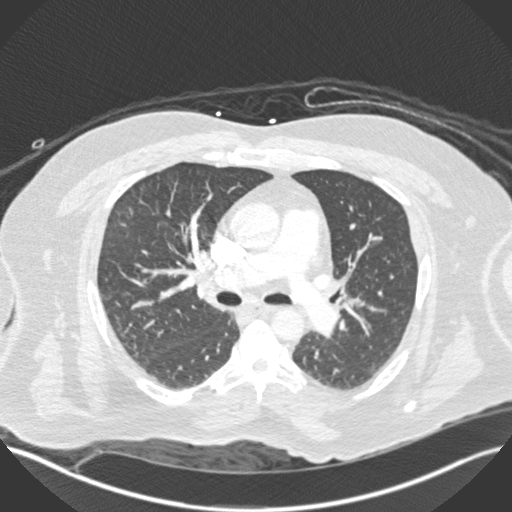
[im 192/308  mediastinal]
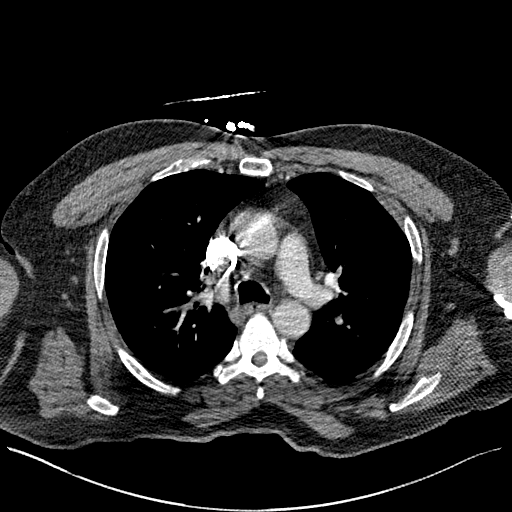
[im 212/308  lung]
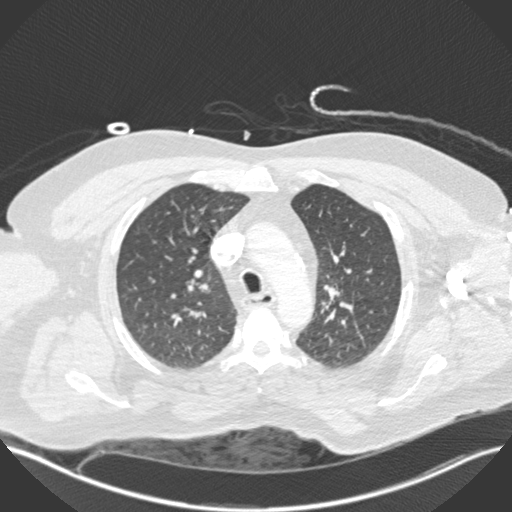
[im 231/308  mediastinal]
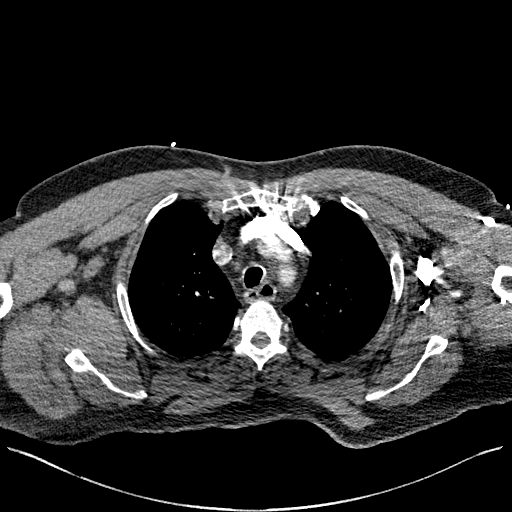
[im 250/308  lung]
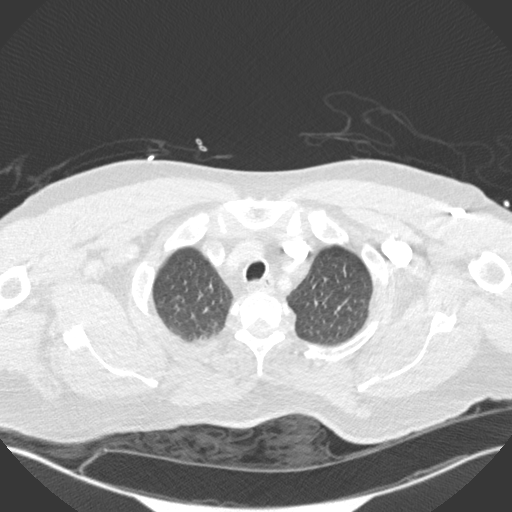
[im 269/308  mediastinal]
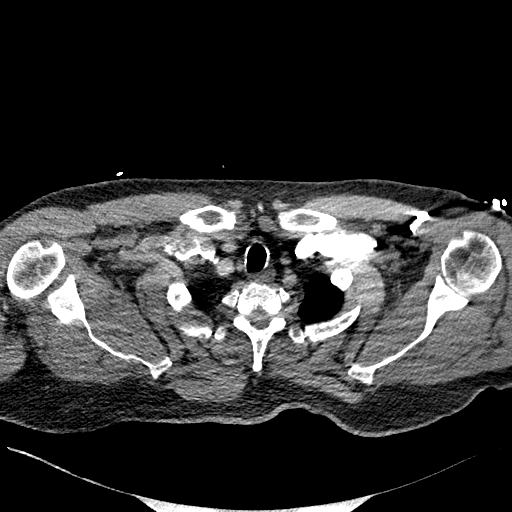
[im 288/308  lung]
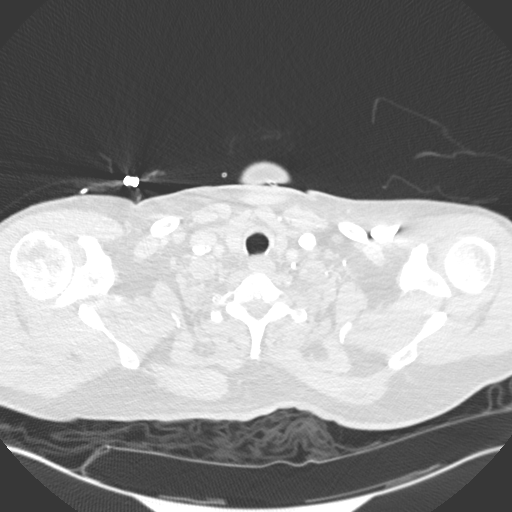

[Series 5: lung · axial · 0.78mm/px · z∈[-445,-319]mm · 3 of 85 slices shown]
[im 22/85  mediastinal]
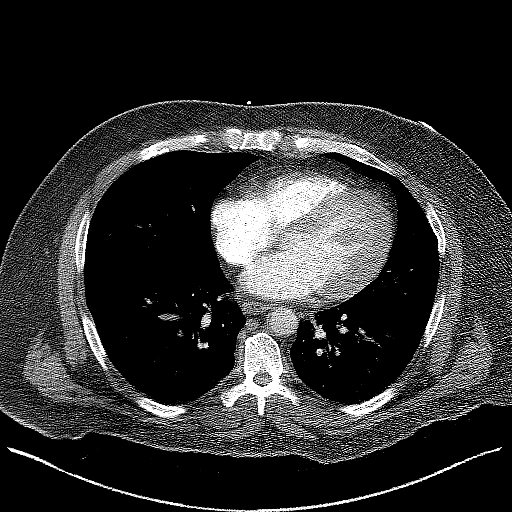
[im 43/85  mediastinal]
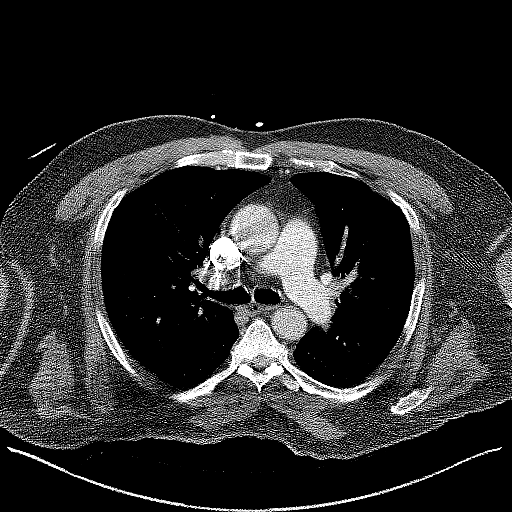
[im 64/85  mediastinal]
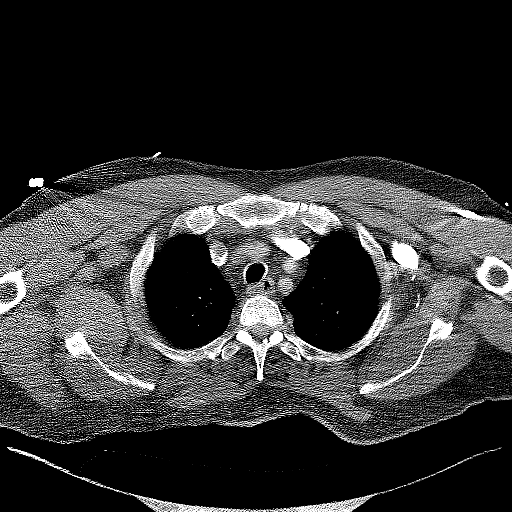

[Series 6: coronal mpr · coronal · 0.59mm/px · 1 of 151 slices shown]
[im 76/151  mediastinal]
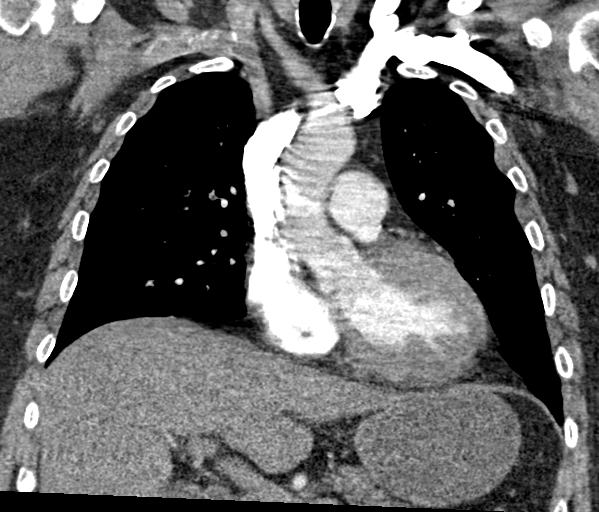

[19 of 36 positions shown; findings below may reference images not displayed]

FINDINGS: Cardiovascular: The heart is normal in size. No pericardial
effusion. The aorta is normal in caliber. No dissection. Moderate
atherosclerotic calcifications at the aortic arch. No obvious
coronary artery calcifications.

The pulmonary arterial tree is fairly well opacified. No definite
filling defects to suggest pulmonary embolism.

Mediastinum/Nodes: No mediastinal or hilar mass or adenopathy. The
esophagus is grossly normal.

Lungs/Pleura: No acute pulmonary findings. No infiltrates, edema or
effusions. No worrisome pulmonary lesions. Minimal dependent
subpleural atelectasis/edema.

Upper Abdomen: No significant upper abdominal findings.

Musculoskeletal: No chest wall mass, supraclavicular or axillary
adenopathy. The thyroid gland appears normal.

The bony thorax is intact.

Review of the MIP images confirms the above findings.
IMPRESSION: 1. No CT findings for pulmonary embolism.
2. Age advanced atherosclerotic calcifications involving the
thoracic aorta but no aneurysm or dissection.
3. No acute pulmonary findings.

Aortic Atherosclerosis (0YYA3-218.8).

Aortic Atherosclerosis (0YYA3-218.8).

## 2021-07-04 ENCOUNTER — Emergency Department (HOSPITAL_BASED_OUTPATIENT_CLINIC_OR_DEPARTMENT_OTHER): Payer: Medicaid Other

## 2021-07-04 ENCOUNTER — Encounter (HOSPITAL_BASED_OUTPATIENT_CLINIC_OR_DEPARTMENT_OTHER): Payer: Self-pay | Admitting: Emergency Medicine

## 2021-07-04 ENCOUNTER — Emergency Department (HOSPITAL_BASED_OUTPATIENT_CLINIC_OR_DEPARTMENT_OTHER)
Admission: EM | Admit: 2021-07-04 | Discharge: 2021-07-04 | Disposition: A | Discharge status: Home / Self Care | Payer: Medicaid Other | Attending: Emergency Medicine | Admitting: Emergency Medicine

## 2021-07-04 DIAGNOSIS — W182XXA Fall in (into) shower or empty bathtub, initial encounter: Secondary | ICD-10-CM | POA: Insufficient documentation

## 2021-07-04 DIAGNOSIS — R200 Anesthesia of skin: Secondary | ICD-10-CM | POA: Diagnosis present

## 2021-07-04 DIAGNOSIS — W19XXXA Unspecified fall, initial encounter: Secondary | ICD-10-CM

## 2021-07-04 NOTE — ED Triage Notes (Signed)
Pt had anterior cervical surgery 3 weeks ago. Wearing an aspen collar at all times, pt is allowed to take his collar off in shower, fell inshower, fell on his left hand. States he didn't hit his head but his Dr. Is worried about a brain bleed , pt on plavix.

## 2021-07-04 NOTE — ED Triage Notes (Signed)
Pt starting having a headache about 2 hours after his fall.

## 2021-07-04 NOTE — ED Provider Notes (Signed)
Robert Rich Provider Note   CSN: [id number] Arrival date & time: 07/04/21  1659     History  Chief Complaint  Patient presents with   Robert Rich    Robert Rich is a 56 y.o. male.  Patient with a fall in the shower this morning.  Patient had some cervical neck surgery done by [facility] was over there because he was concerned that he may have injured his neck.  He wears a hard collar at all times.  Did not directly hit his head.  But the [facility] had put an order in for him to have a head CT done by radiology here out of some confusion it seems like patient ended up in the ED.  Head CT has been completed and it shows no acute findings.  Patient is now currently completely asymptomatic [facility] already did x-rays of his neck at their office today.  Initially when patient fell in the shower he had a little bit of numbness to his left arm and that is what concerned him that maybe he had done some injury to the screws that he has in his neck.  [facility] was not worried about any of the neck needing to be visualized more because they had already done that in the office.       Home Medications Prior to Admission medications   Medication Sig Start Date End Date Taking? Authorizing Provider  albuterol (VENTOLIN HFA) 108 (90 Base) MCG/ACT inhaler Inhale 1-2 puffs into the lungs every 6 (six) hours as needed for wheezing or shortness of breath.    [provider]  [name] 62.5-25 MCG/ACT AEPB Inhale 1 puff into the lungs daily. 02/19/21   [provider]  FARXIGA 10 MG TABS tablet Take 10 mg by mouth daily. 04/15/21   [provider]  folic acid (FOLVITE) 1 MG tablet Take 1 tablet (1 mg total) by mouth daily. 07/22/19   [name], MD  insulin NPH-regular Human (NOVOLIN 70/30) (70-30) 100 UNIT/ML injection Inject 50 Units into the skin 2 (two) times daily with a meal. Patient taking differently: Inject 50 Units into the skin See  admin instructions. Inject 50 units into the skin in the morning and 50 units in the evening if blood glucose is over 200 08/11/19   [name], David, MD  ipratropium-albuterol (DUONEB) 0.5-2.5 (3) MG/3ML SOLN Inhale 3 mLs into the lungs every 6 (six) hours as needed (shortness of breath). 04/20/18   [provider]  losartan (COZAAR) 50 MG tablet Take 50 mg by mouth daily. 02/10/21   [provider]  metFORMIN (GLUCOPHAGE) 1000 MG tablet Take 1 tablet (1,000 mg total) by mouth 2 (two) times daily with a meal. 07/22/19   [name], MD  metoprolol succinate (TOPROL-XL) 25 MG 24 hr tablet Take 1 tablet (25 mg total) by mouth daily. Patient taking differently: Take 12.5 mg by mouth daily. 07/22/19   [name], MD  montelukast (SINGULAIR) 10 MG tablet Take 1 tablet (10 mg total) by mouth at bedtime. 08/11/19   Orson Eva, MD  ondansetron (ZOFRAN) 4 MG tablet Take 1 tablet (4 mg total) by mouth every 8 (eight) hours as needed for nausea or vomiting. 06/10/21   Melina Schools, MD  pregabalin (LYRICA) 75 MG capsule Take 75 mg by mouth 2 (two) times daily. 04/02/21   [provider]  rosuvastatin (CRESTOR) 20 MG tablet Take 20 mg by mouth daily.    [provider]  thiamine 100 MG  tablet Take 2.5 tablets (250 mg total) by mouth daily. Patient taking differently: Take 100 mg by mouth daily. 07/22/19   [name], MD      Allergies    Asa [aspirin] and Zolmitriptan    Review of Systems   Review of Systems  Constitutional:  Negative for chills and fever.  HENT:  Negative for ear pain and sore throat.   Eyes:  Negative for pain and visual disturbance.  Respiratory:  Negative for cough and shortness of breath.   Cardiovascular:  Negative for chest pain and palpitations.  Gastrointestinal:  Negative for abdominal pain and vomiting.  Genitourinary:  Negative for dysuria and hematuria.  Musculoskeletal:  Negative for arthralgias and back pain.  Skin:  Negative for color  change and rash.  Neurological:  Negative for seizures, syncope, weakness and numbness.  All other systems reviewed and are negative.   Physical Exam Updated Vital Signs BP 121/77   Pulse 99   Temp 98.2 F (36.8 C)   Resp 16   SpO2 97%  Physical Exam Vitals and nursing note reviewed.  Constitutional:      General: He is not in acute distress.    Appearance: Normal appearance. He is well-developed.  HENT:     Head: Normocephalic and atraumatic.  Eyes:     Extraocular Movements: Extraocular movements intact.     Conjunctiva/sclera: Conjunctivae normal.     Pupils: Pupils are equal, round, and reactive to light.  Neck:     Comments: Hard collar in place Cardiovascular:     Rate and Rhythm: Normal rate and regular rhythm.     Heart sounds: No murmur heard. Pulmonary:     Effort: Pulmonary effort is normal. No respiratory distress.     Breath sounds: Normal breath sounds.  Abdominal:     Palpations: Abdomen is soft.     Tenderness: There is no abdominal tenderness.  Musculoskeletal:        General: No swelling.  Skin:    General: Skin is warm and dry.     Capillary Refill: Capillary refill takes less than 2 seconds.  Neurological:     General: No focal deficit present.     Mental Status: He is alert and oriented to person, place, and time.     Cranial Nerves: No cranial nerve deficit.     Sensory: No sensory deficit.     Motor: No weakness.  Psychiatric:        Mood and Affect: Mood normal.     ED Results / Procedures / Treatments   Labs (all labs ordered are listed, but only abnormal results are displayed) Labs Reviewed - No data to display  EKG None  Radiology CT Head Wo Contrast  Result Date: 07/04/2021 CLINICAL DATA:  Head trauma, moderate-severe on plavix EXAM: CT HEAD WITHOUT CONTRAST TECHNIQUE: Contiguous axial images were obtained from the base of the skull through the vertex without intravenous contrast. RADIATION DOSE REDUCTION: This exam was  performed according to the departmental dose-optimization program which includes automated exposure control, adjustment of the mA and/or kV according to patient size and/or use of iterative reconstruction technique. COMPARISON:  CT head 11/01/2019 BRAIN: BRAIN Chronic right cerebellar infarction. No evidence of large-territorial acute infarction. No parenchymal hemorrhage. No mass lesion. No extra-axial collection. No mass effect or midline shift. No hydrocephalus. Basilar cisterns are patent. Vascular: No hyperdense vessel. Skull: No acute fracture or focal lesion. Sinuses/Orbits: Mucosal thickening of the maxillary, ethmoid, sphenoid sinuses. Paranasal sinuses and mastoid  air cells are clear. The orbits are unremarkable. Other: None. IMPRESSION: No acute intracranial abnormality. Electronically Signed   By: Iven Finn M.D.   On: 07/04/2021 17:50    Procedures Procedures    Medications Ordered in ED Medications - No data to display  ED Course/ Medical Decision Making/ A&P                           Medical Decision Making Amount and/or Complexity of Data Reviewed Radiology: ordered.   No acute neurodeficits currently.  Patient's neck cleared by [facility].  Sounds like patient was supposed to go directly to radiology because [facility] had ordered a head CT and CT has been read by radiology as no acute findings.  Patient stable for discharge back home and follow-up with [facility].   Final Clinical Impression(s) / ED Diagnoses Final diagnoses:  Fall, initial encounter    Rx / DC Orders ED Discharge Orders     None         Fredia Sorrow, MD 07/04/21 2209

## 2021-07-04 NOTE — ED Notes (Signed)
Pt beginning to become frustrated d/t long wait. Discussed with EDP Zackowski to come see pt. Pt offered gatorade after stating he has not had anything to eat/drink for several hours. Pt thanked for being pt with staff

## 2021-07-04 NOTE — ED Notes (Signed)
Discharge instructions discussed with pt. Pt verbalized understanding with no questions at this time.  

## 2021-07-04 NOTE — Discharge Instructions (Signed)
CT head without any acute findings.  Follow back up with [facility].

## 2021-07-05 ENCOUNTER — Other Ambulatory Visit: Payer: Self-pay | Admitting: Orthopedic Surgery

## 2021-07-05 DIAGNOSIS — Z9181 History of falling: Secondary | ICD-10-CM

## 2021-07-23 ENCOUNTER — Encounter: Payer: Self-pay | Admitting: Physical Therapy

## 2021-07-23 ENCOUNTER — Ambulatory Visit: Payer: Medicaid Other | Attending: Orthopedic Surgery | Admitting: Physical Therapy

## 2021-07-23 DIAGNOSIS — M6281 Muscle weakness (generalized): Secondary | ICD-10-CM | POA: Diagnosis present

## 2021-07-23 DIAGNOSIS — M542 Cervicalgia: Secondary | ICD-10-CM | POA: Diagnosis present

## 2021-07-23 NOTE — Therapy (Signed)
[facility] [address] [place], [place], [address] Phone: [phone]   Fax:  [phone]  Physical Therapy Evaluation  Patient Details  Name: Robert Rich MRN: [id number] Date of Birth: March 27, 1965 Referring Provider (PT): Venita Lick MD   Encounter Date: 07/23/2021   PT End of Session - 07/23/21 1416     Visit Number 1    Number of Visits 12    Date for PT Re-Evaluation 09/03/21    PT Start Time 0145    PT Stop Time 0210    PT Time Calculation (min) 25 min    Activity Tolerance Patient tolerated treatment well    Behavior During Therapy [facility] for tasks assessed/performed             Past Medical History:  Diagnosis Date   COPD (chronic obstructive pulmonary disease) (HCC)    Coronary artery disease    DM (diabetes mellitus) (HCC)    Dyspnea    Heart attack (HCC)    HLD (hyperlipidemia)    HTN (hypertension)    Obesity    Stroke [facility])     Past Surgical History:  Procedure Laterality Date   ANTERIOR CERVICAL DECOMPRESSION/DISCECTOMY FUSION 4 LEVELS N/A 06/10/2021   Procedure: Anterior cervical discectomy and fusion Cervical four to seven;  Surgeon: Venita Lick, MD;  Location: [facility] OR;  Service: Orthopedics;  Laterality: N/A;   INTRAVASCULAR PRESSURE WIRE/FFR STUDY N/A 03/18/2019   Procedure: INTRAVASCULAR PRESSURE WIRE/FFR STUDY;  Surgeon: [name], MD;  Location: [facility] INVASIVE CV LAB;  Service: Cardiovascular;  Laterality: N/A;   LEFT HEART CATH AND CORONARY ANGIOGRAPHY N/A 03/18/2019   Procedure: LEFT HEART CATH AND CORONARY ANGIOGRAPHY;  Surgeon: [name], MD;  Location: [facility] INVASIVE CV LAB;  Service: Cardiovascular;  Laterality: N/A;   NO PAST SURGERIES      There were no vitals filed for this visit.    Subjective Assessment - 07/23/21 1420     Subjective The patient presents to the clinic today with c/o s/p C4 to C7 cervical fusion on 06/10/21.  He is pleased with the results of his surgery thus far.  He is  having some right shoulder pain and numbness over the first and second finger of his left hand.  If he moves his left UE a lot this will increase his pain.  Not moving decreases pain.    Pertinent History HLD, HTN, "two small strokes", CAD, DM, MI, heart cath.    Patient Stated Goals Would love to get out of pain and do things like he was able before.    Currently in Pain? Yes    Pain Score 8     Pain Location Shoulder    Pain Descriptors / Indicators Aching;Sharp    Pain Type Surgical pain    Pain Radiating Towards Left firs and second fingers.    Pain Onset More than a month ago    Aggravating Factors  See above.    Pain Relieving Factors See above.                [facility] PT Assessment - 07/23/21 0001       Assessment   Medical Diagnosis Encounter for other specified surgical aftercare (cervical fusion C4 to C7).    Referring Provider (PT) Venita Lick MD    Onset Date/Surgical Date --   06/13/21 (surgical date).   Hand Dominance Right      Precautions   Precaution Comments Please follow cervical fusion protocol.  Restrictions   Weight Bearing Restrictions No      Balance Screen   Has the patient fallen in the past 6 months --   Had a fall in shower.  Dr. Shon Baton saw afterwards.     Home Environment   Living Environment Private residence      Prior Function   Level of Independence Independent      Posture/Postural Control   Postural Limitations Rounded Shoulders;Forward head      Deep Tendon Reflexes   DTR Assessment Site Biceps;Brachioradialis;Triceps    Biceps DTR --   Absent on left.   Brachioradialis DTR 2+    Triceps DTR 2+      AROM   Overall AROM Comments Active left cervical rotation is 40 degrees and right rotation is 35 degrees.      Strength   Overall Strength Comments Left shoulder ER is 4-/5 and IR is 4+/5.  Right grip (dominant side) is 72# and left is 40#.      Palpation   Palpation comment C/o left UT pain and referred to right shoulder.                         Objective measurements completed on examination: See above findings.                     PT Long Term Goals - 07/23/21 1519       PT LONG TERM GOAL #1   Title Independent with a HEP.    Baseline No knowledge of appropriate ther ex. post cervical fusion.    Time 6    Period Weeks    Status New      PT LONG TERM GOAL #2   Title Increase active bilateral cervical rotation to 60 degrees+ so patient can turn head more easily while driving.    Baseline LT 40 degrees and RT is 35 degrees.    Time 6    Period Weeks    Status New      PT LONG TERM GOAL #3   Title Eliminate UE symptoms.    Baseline Symptoms into  left shoulderand  to first and second fingers of left hand.    Time 6    Period Weeks    Status New      PT LONG TERM GOAL #4   Title Left UE strength to 5/5.    Baseline Strength deficits of 4- and 4+/5.    Time 6    Period Weeks    Status New                    Plan - 07/23/21 1515     Clinical Impression Statement The patient presents to OPPT s/p cervical fusion (C4 to C7) performed on 06/13/21.  He is pleased with his progress thus far.  He is having some left shoulder pain and numbness over the first and second fingers of his right hand.  He has some associated weakness over his left UE.  He has an expected loss of active cervical rotation at this time.  Patient will benefit from skilled physical therapy intervention to address pain and deficits.    Personal Factors and Comorbidities Comorbidity 1;Comorbidity 2;Other    Comorbidities HLD, HTN, "two small strokes", CAD, DM, MI, heart cath.    Examination-Activity Limitations Other    Stability/Clinical Decision Making Stable/Uncomplicated    Clinical Decision Making Low    Rehab Potential  Excellent    PT Frequency 2x / week    PT Duration 6 weeks    PT Treatment/Interventions ADLs/Self Care Home Management;Cryotherapy;Electrical  Stimulation;Ultrasound;Traction;Moist Heat;Therapeutic activities;Therapeutic exercise;Manual techniques;Patient/family education;Passive range of motion;Dry needling    PT Next Visit Plan Please progress per cervical fusion protocol.  Modalities and STW/M as needed.    Consulted and Agree with Plan of Care Patient             Patient will benefit from skilled therapeutic intervention in order to improve the following deficits and impairments:  Decreased activity tolerance, Decreased range of motion, Decreased strength, Increased muscle spasms, Pain, Postural dysfunction  Visit Diagnosis: Cervicalgia - Plan: PT plan of care cert/re-cert  Muscle weakness (generalized) - Plan: PT plan of care cert/re-cert     Problem List Patient Active Problem List   Diagnosis Date Noted   Cervical disc herniation 06/10/2021   Exertional chest pain 08/09/2020   COPD (chronic obstructive pulmonary disease) (HCC) 12/20/2019   Type 2 diabetes mellitus without complication (HCC) 12/20/2019   COPD GOLD 0/  group B 08/24/2019   Multiple pulmonary nodules determined by computed tomography of lung 08/24/2019   COPD exacerbation (HCC)    COPD with acute exacerbation (HCC) 08/04/2019   Coronary artery disease    HLD (hyperlipidemia)    Leukocytosis    Hyperglycemia    Steroid-induced hyperglycemia    Acute respiratory failure with hypoxia (HCC)    Morbid obesity due to excess calories (HCC)    Hypertensive crisis    Anxiety    Substance abuse (HCC)    Cardiac arrest (HCC) 03/17/2019   2019 novel coronavirus disease (COVID-19) 12/07/2018   Tobacco abuse 12/06/2018   Rationale for Evaluation and Treatment Rehabilitation.  Tesneem Dufrane, [place], PT 07/23/2021, 3:22 PM  [facility] [address] [place], [place], [address] Phone: [phone]   Fax:  [phone]  Name: Robert Rich MRN: [id number] Date of Birth: [birth year]

## 2021-07-31 ENCOUNTER — Ambulatory Visit: Payer: Medicaid Other | Attending: Orthopedic Surgery

## 2021-07-31 DIAGNOSIS — M542 Cervicalgia: Secondary | ICD-10-CM | POA: Diagnosis not present

## 2021-07-31 DIAGNOSIS — R293 Abnormal posture: Secondary | ICD-10-CM | POA: Diagnosis present

## 2021-07-31 DIAGNOSIS — M6281 Muscle weakness (generalized): Secondary | ICD-10-CM | POA: Diagnosis present

## 2021-07-31 NOTE — Therapy (Signed)
OUTPATIENT PHYSICAL THERAPY TREATMENT NOTE   Patient Name: Robert Rich MRN: [id number] DOB:October 13, 1965, 56 y.o., male Today's Date: 07/31/2021  PCP: Rebecka Apley, NP REFERRING PROVIDER: Venita Lick, MD   PT End of Session - 07/31/21 [phone]     Visit Number 2    Number of Visits 12    Date for PT Re-Evaluation 09/03/21    PT Start Time 0815    PT Stop Time 0916    PT Time Calculation (min) 61 min    Activity Tolerance Patient tolerated treatment well    Behavior During Therapy [facility] for tasks assessed/performed             Past Medical History:  Diagnosis Date   COPD (chronic obstructive pulmonary disease) (HCC)    Coronary artery disease    DM (diabetes mellitus) (HCC)    Dyspnea    Heart attack (HCC)    HLD (hyperlipidemia)    HTN (hypertension)    Obesity    Stroke [facility])    Past Surgical History:  Procedure Laterality Date   ANTERIOR CERVICAL DECOMPRESSION/DISCECTOMY FUSION 4 LEVELS N/A 06/10/2021   Procedure: Anterior cervical discectomy and fusion Cervical four to seven;  Surgeon: Venita Lick, MD;  Location: [facility] OR;  Service: Orthopedics;  Laterality: N/A;   INTRAVASCULAR PRESSURE WIRE/FFR STUDY N/A 03/18/2019   Procedure: INTRAVASCULAR PRESSURE WIRE/FFR STUDY;  Surgeon: [name], MD;  Location: [facility] INVAS[facility] CV LAB;  Service: Cardiovascular;  Laterality: N/A;   LEFT HEART CATH AND CORONARY ANGIOGRAPHY N/A 03/18/2019   Procedure: LEFT HEART CATH AND CORONARY ANGIOGRAPHY;  Surgeon: [name], MD;  Location: [facility] INVASIVE CV LAB;  Service: Cardiovascular;  Laterality: N/A;   NO PAST SURGERIES     Patient Active Problem List   Diagnosis Date Noted   Cervical disc herniation 06/10/2021   Exertional chest pain 08/09/2020   COPD (chronic obstructive pulmonary disease) (HCC) 12/20/2019   Type 2 diabetes mellitus without complication (HCC) 12/20/2019   COPD GOLD 0/  group B 08/24/2019   Multiple pulmonary nodules determined by computed tomography of lung  08/24/2019   COPD exacerbation (HCC)    COPD with acute exacerbation (HCC) 08/04/2019   Coronary artery disease    HLD (hyperlipidemia)    Leukocytosis    Hyperglycemia    Steroid-induced hyperglycemia    Acute respiratory failure with hypoxia (HCC)    Morbid obesity due to excess calories (HCC)    Hypertensive crisis    Anxiety    Substance abuse (HCC)    Cardiac arrest (HCC) 03/17/2019   2019 novel coronavirus disease (COVID-19) 12/07/2018   Tobacco abuse 12/06/2018    REFERRING DIAG: Encounter for other specified surgical aftercare (cervical fusion C4 to C7).  THERAPY DIAG:  Cervicalgia  Muscle weakness (generalized)  Abnormal posture  Rationale for Evaluation and Treatment Rehabilitation  PERTINENT HISTORY: HLD, HTN, "two small strokes", CAD, DM, MI, heart cath.   SUBJECTIVE: Pt arrives for today's treatment session stating that he slept in the bed for the first time last night and is very sore today.   PAIN:  Are you having pain? Yes: NPRS scale: 9/10 Pain location: Cervical     TODAY'S TREATMENT:                                     EXERCISE LOG  Exercise Repetitions and Resistance Comments  Nustep Lvl 3 x 10 mins   Bicep  Curls 3-way 2# 15 reps   Seated Rows Yellow tband x 20 reps   Seated Extensions Yellow tband x 20 reps   Horizontal Abduction Yellow tband x 20 reps            Blank cell = exercise not performed today   Manual Therapy Soft Tissue Mobilization: cervical spine, STW/M to right upper trap and deltoid and left anterior scapular border to decrease pain and tone  Modalities  Date:  Unattended Estim: Cervical, IFC 80-150 Hz, 15 mins, Pain and Tone Hot Pack: Cervical, 15 mins, Pain and Tone     PT Long Term Goals - 07/31/21 1497       PT LONG TERM GOAL #1   Title Independent with a HEP.    Baseline No knowledge of appropriate ther ex. post cervical fusion.    Time 6    Period Weeks    Status New      PT LONG TERM GOAL #2    Title Increase active bilateral cervical rotation to 60 degrees+ so patient can turn head more easily while driving.    Baseline LT 40 degrees and RT is 35 degrees.    Time 6    Period Weeks    Status New      PT LONG TERM GOAL #3   Title Eliminate UE symptoms.    Baseline Symptoms into  left shoulderand  to first and second fingers of left hand.    Time 6    Period Weeks    Status New      PT LONG TERM GOAL #4   Title Left UE strength to 5/5.    Baseline Strength deficits of 4- and 4+/5.    Time 6    Period Weeks    Status New              Plan - 07/31/21 0819     Clinical Impression Statement Pt arrives for today's treatment session reporting 9/10 cervical spine pain.  Pt states that he slept in the bed for the first time last night since surgery and he is very sore and tight today.  Pt introduced to seated resisted shoulder exercises to increase strength and function while decreasing tone and pain.  STW/M performed to right upper trap and deltoid and left anterior scapular musculature.  Normal responses to estim and MH noted upon removal.  Pt reported 4/10 cervical spine pain at completion of today's treatment session.    Personal Factors and Comorbidities Comorbidity 1;Comorbidity 2;Other    Comorbidities HLD, HTN, "two small strokes", CAD, DM, MI, heart cath.    Examination-Activity Limitations Other    Stability/Clinical Decision Making Stable/Uncomplicated    Rehab Potential Excellent    PT Frequency 2x / week    PT Duration 6 weeks    PT Treatment/Interventions ADLs/Self Care Home Management;Cryotherapy;Electrical Stimulation;Ultrasound;Traction;Moist Heat;Therapeutic activities;Therapeutic exercise;Manual techniques;Patient/family education;Passive range of motion;Dry needling    PT Next Visit Plan Please progress per cervical fusion protocol.  Modalities and STW/M as needed.    Consulted and Agree with Plan of Care Patient               Newman Pies,  PTA 07/31/2021, 9:19 AM

## 2021-08-02 ENCOUNTER — Ambulatory Visit: Payer: Medicaid Other | Admitting: Physical Therapy

## 2021-08-02 ENCOUNTER — Encounter: Payer: Self-pay | Admitting: Physical Therapy

## 2021-08-02 DIAGNOSIS — M542 Cervicalgia: Secondary | ICD-10-CM | POA: Diagnosis not present

## 2021-08-02 DIAGNOSIS — R293 Abnormal posture: Secondary | ICD-10-CM

## 2021-08-02 DIAGNOSIS — M6281 Muscle weakness (generalized): Secondary | ICD-10-CM

## 2021-08-02 NOTE — Therapy (Signed)
OUTPATIENT PHYSICAL THERAPY TREATMENT NOTE   Patient Name: Robert Rich MRN: [id number] DOB:06/08/1965, 56 y.o., male Today's Date: 08/02/2021  PCP: Rebecka Apley, NP REFERRING PROVIDER: Venita Lick, MD   PT End of Session - 08/02/21 0908     Visit Number 3    Number of Visits 12    Date for PT Re-Evaluation 09/03/21    PT Start Time 0901    PT Stop Time 0944    PT Time Calculation (min) 43 min    Activity Tolerance Patient tolerated treatment well    Behavior During Therapy [facility] for tasks assessed/performed             Past Medical History:  Diagnosis Date   COPD (chronic obstructive pulmonary disease) (HCC)    Coronary artery disease    DM (diabetes mellitus) (HCC)    Dyspnea    Heart attack (HCC)    HLD (hyperlipidemia)    HTN (hypertension)    Obesity    Stroke [facility])    Past Surgical History:  Procedure Laterality Date   ANTERIOR CERVICAL DECOMPRESSION/DISCECTOMY FUSION 4 LEVELS N/A 06/10/2021   Procedure: Anterior cervical discectomy and fusion Cervical four to seven;  Surgeon: Venita Lick, MD;  Location: [facility] OR;  Service: Orthopedics;  Laterality: N/A;   INTRAVASCULAR PRESSURE WIRE/FFR STUDY N/A 03/18/2019   Procedure: INTRAVASCULAR PRESSURE WIRE/FFR STUDY;  Surgeon: [name], MD;  Location: [facility] INV[facility] CV LAB;  Service: Cardiovascular;  Laterality: N/A;   LEFT HEART CATH AND CORONARY ANGIOGRAPHY N/A 03/18/2019   Procedure: LEFT HEART CATH AND CORONARY ANGIOGRAPHY;  Surgeon: [name], MD;  Location: [facility];  Service: Cardiovascular;  Laterality: N/A;   NO PAST SURGERIES     Patient Active Problem List   Diagnosis Date Noted   Cervical disc herniation 06/10/2021   Exertional chest pain 08/09/2020   COPD (chronic obstructive pulmonary disease) (HCC) 12/20/2019   Type 2 diabetes mellitus without complication (HCC) 12/20/2019   COPD GOLD 0/  group B 08/24/2019   Multiple pulmonary nodules determined by computed tomography of lung  08/24/2019   COPD exacerbation (HCC)    COPD with acute exacerbation (HCC) 08/04/2019   Coronary artery disease    HLD (hyperlipidemia)    Leukocytosis    Hyperglycemia    Steroid-induced hyperglycemia    Acute respiratory failure with hypoxia (HCC)    Morbid obesity due to excess calories (HCC)    Hypertensive crisis    Anxiety    Substance abuse (HCC)    Cardiac arrest (HCC) 03/17/2019   2019 novel coronavirus disease (COVID-19) 12/07/2018   Tobacco abuse 12/06/2018    REFERRING DIAG: Encounter for other specified surgical aftercare (cervical fusion C4 to C7).  THERAPY DIAG:  Cervicalgia  Muscle weakness (generalized)  Abnormal posture  Rationale for Evaluation and Treatment Rehabilitation  PERTINENT HISTORY: HLD, HTN, "two small strokes", CAD, DM, MI, heart cath.   SUBJECTIVE: Slept in the bed again last night and feels better. Has more functional ROM but still some soreness in L shoulder.  PAIN:  Are you having pain? Yes: NPRS scale: 3/10 Pain location: L shoulder Pain description: ache, sore Aggravating factors:   Relieving factors:         TODAY'S TREATMENT:                                     EXERCISE LOG  Exercise Repetitions and Resistance  Comments  Nustep Lvl 3 x 10 mins   Bicep Curls 3-way 2# 20 reps each   Seated Rows Yellow tband x 20 reps   Seated Extensions Yellow tband x 20 reps   Horizontal Abduction Yellow tband x 20 reps   UBE 120 RPM x6 min (forward/backward)        Blank cell = exercise not performed today   Modalities  Date:  Unattended Estim: Cervical, IFC 80-150 Hz, 15 mins, Pain and Tone Hot Pack: Cervical, 15 mins, Pain and Tone     PT Long Term Goals - 07/31/21 8676       PT LONG TERM GOAL #1   Title Independent with a HEP.    Baseline No knowledge of appropriate ther ex. post cervical fusion.    Time 6    Period Weeks    Status On-going     PT LONG TERM GOAL #2   Title Increase active bilateral cervical rotation  to 60 degrees+ so patient can turn head more easily while driving.    Baseline LT 40 degrees and RT is 35 degrees.    Time 6    Period Weeks    Status On-going     PT LONG TERM GOAL #3   Title Eliminate UE symptoms.    Baseline Symptoms into  left shoulderand  to first and second fingers of left hand.    Time 6    Period Weeks    Status On-going      PT LONG TERM GOAL #4   Title Left UE strength to 5/5.    Baseline Strength deficits of 4- and 4+/5.    Time 6    Period Weeks    Status On-going             Plan - 07/31/21 1950     Clinical Impression Statement Patient presented in clinic with mild L shoulder ache, soreness but feeling overall better as he slept in bed better last night. Patient progressed through light strengthening with only report of hands being red following end of UBE session. Patient still experiencing aching into L UT. Normal modalities response noted following removal of the modalities.   Personal Factors and Comorbidities Comorbidity 1;Comorbidity 2;Other    Comorbidities HLD, HTN, "two small strokes", CAD, DM, MI, heart cath.    Examination-Activity Limitations Other    Stability/Clinical Decision Making Stable/Uncomplicated    Rehab Potential Excellent    PT Frequency 2x / week    PT Duration 6 weeks    PT Treatment/Interventions ADLs/Self Care Home Management;Cryotherapy;Electrical Stimulation;Ultrasound;Traction;Moist Heat;Therapeutic activities;Therapeutic exercise;Manual techniques;Patient/family education;Passive range of motion;Dry needling    PT Next Visit Plan Please progress per cervical fusion protocol.  Modalities and STW/M as needed.    Consulted and Agree with Plan of Care Patient            Marvell Fuller, PTA 08/02/21 10:00 AM

## 2021-08-07 ENCOUNTER — Ambulatory Visit: Payer: Medicaid Other | Admitting: Physical Therapy

## 2021-08-07 ENCOUNTER — Encounter: Payer: Self-pay | Admitting: Physical Therapy

## 2021-08-07 DIAGNOSIS — M6281 Muscle weakness (generalized): Secondary | ICD-10-CM

## 2021-08-07 DIAGNOSIS — M542 Cervicalgia: Secondary | ICD-10-CM | POA: Diagnosis not present

## 2021-08-07 DIAGNOSIS — R293 Abnormal posture: Secondary | ICD-10-CM

## 2021-08-07 NOTE — Therapy (Signed)
OUTPATIENT PHYSICAL THERAPY TREATMENT NOTE   Patient Name: Robert Rich MRN: [id number] DOB:1965-08-31, 56 y.o., male Today's Date: 08/07/2021  PCP: Bridget Hartshorn, NP REFERRING PROVIDER: Melina Schools, MD   PT End of Session - 08/07/21 0818     Visit Number 4    Number of Visits 12    Date for PT Re-Evaluation 09/03/21    PT Start Time 0815    PT Stop Time 0901    PT Time Calculation (min) 46 min    Activity Tolerance Patient tolerated treatment well    Behavior During Therapy [facility] for tasks assessed/performed             Past Medical History:  Diagnosis Date   COPD (chronic obstructive pulmonary disease) ([place])    Coronary artery disease    DM (diabetes mellitus) ([place])    Dyspnea    Heart attack ([place])    HLD (hyperlipidemia)    HTN (hypertension)    Obesity    Stroke [facility])    Past Surgical History:  Procedure Laterality Date   ANTERIOR CERVICAL DECOMPRESSION/DISCECTOMY FUSION 4 LEVELS N/A 06/10/2021   Procedure: Anterior cervical discectomy and fusion Cervical four to seven;  Surgeon: Melina Schools, MD;  Location: [place];  Service: Orthopedics;  Laterality: N/A;   INTRAVASCULAR PRESSURE WIRE/FFR STUDY N/A 03/18/2019   Procedure: INTRAVASCULAR PRESSURE WIRE/FFR STUDY;  Surgeon: Adrian Prows, MD;  Location: [place] CV LAB;  Service: Cardiovascular;  Laterality: N/A;   LEFT HEART CATH AND CORONARY ANGIOGRAPHY N/A 03/18/2019   Procedure: LEFT HEART CATH AND CORONARY ANGIOGRAPHY;  Surgeon: Adrian Prows, MD;  Location: [place] CV LAB;  Service: Cardiovascular;  Laterality: N/A;   NO PAST SURGERIES     Patient Active Problem List   Diagnosis Date Noted   Cervical disc herniation 06/10/2021   Exertional chest pain 08/09/2020   COPD (chronic obstructive pulmonary disease) ([place]) 12/20/2019   Type 2 diabetes mellitus without complication ([place]) 54/56/2563   COPD GOLD 0/  group B 08/24/2019   Multiple pulmonary nodules determined by computed tomography of lung  08/24/2019   COPD exacerbation (HCC)    COPD with acute exacerbation ([place]) 08/04/2019   Coronary artery disease    HLD (hyperlipidemia)    Leukocytosis    Hyperglycemia    Steroid-induced hyperglycemia    Acute respiratory failure with hypoxia ([place])    Morbid obesity due to excess calories ([place])    Hypertensive crisis    Anxiety    Substance abuse ([place])    Cardiac arrest ([place]) 03/17/2019   2019 novel coronavirus disease (COVID-19) 12/07/2018   Tobacco abuse 12/06/2018    REFERRING DIAG: Encounter for other specified surgical aftercare (cervical fusion C4 to C7).  THERAPY DIAG:  Muscle weakness (generalized)  Cervicalgia  Abnormal posture  Rationale for Evaluation and Treatment Rehabilitation  PERTINENT HISTORY: HLD, HTN, "two small strokes", CAD, DM, MI, heart cath.   SUBJECTIVE: Reports that his neck feels good but his shoulder is a little sore from fishing.  PAIN:  Are you having pain? Yes: NPRS scale: 2/10 Pain location: L shoulder Pain description: ache Aggravating factors:   Relieving factors:       TODAY'S TREATMENT:                                     EXERCISE LOG  Exercise Repetitions and Resistance Comments  Nustep Lvl 5 x 12 mins  Cervical extension isometric X10 reps 3 sec holds   Seated Rows Yellow tband x 20 reps   Seated Extensions Yellow tband x 20 reps   Horizontal Abduction Yellow tband x 20 reps   UBE 90 RPM x8 min (forward/backward)        Blank cell = exercise not performed today   Modalities  Date:  Unattended Estim: Cervical, IFC 80-150 Hz, 15 mins, Pain and Tone Hot Pack: Cervical, 15 mins, Pain and Tone    PT Long Term Goals - 07/31/21 8333       PT LONG TERM GOAL #1   Title Independent with a HEP.    Baseline No knowledge of appropriate ther ex. post cervical fusion.    Time 6    Period Weeks    Status On-going     PT LONG TERM GOAL #2   Title Increase active bilateral cervical rotation to 60 degrees+ so patient can  turn head more easily while driving.    Baseline LT 40 degrees and RT is 35 degrees.    Time 6    Period Weeks    Status On-going     PT LONG TERM GOAL #3   Title Eliminate UE symptoms.    Baseline Symptoms into  left shoulderand  to first and second fingers of left hand.    Time 6    Period Weeks    Status Partially met     PT LONG TERM GOAL #4   Title Left UE strength to 5/5.    Baseline Strength deficits of 4- and 4+/5.    Time 6    Period Weeks    Status On-going             Plan - 07/31/21 0819     Clinical Impression Statement Patient presented in clinic with reports of low level L shoulder ache after fishing yesterday afternoon. Patient able to tolerate therex fairly well other than muscle fatigue with minor increase in time and resistance. Cervical extension isometrics completed as well. Patient was provided VCs to avoid shoulder elevation with postural strengthening. Normal modalities response noted following removal of the modalities.   Personal Factors and Comorbidities Comorbidity 1;Comorbidity 2;Other    Comorbidities HLD, HTN, "two small strokes", CAD, DM, MI, heart cath.    Examination-Activity Limitations Other    Stability/Clinical Decision Making Stable/Uncomplicated    Rehab Potential Excellent    PT Frequency 2x / week    PT Duration 6 weeks    PT Treatment/Interventions ADLs/Self Care Home Management;Cryotherapy;Electrical Stimulation;Ultrasound;Traction;Moist Heat;Therapeutic activities;Therapeutic exercise;Manual techniques;Patient/family education;Passive range of motion;Dry needling    PT Next Visit Plan Please progress per cervical fusion protocol.  Modalities and STW/M as needed.    Consulted and Agree with Plan of Care Patient            Standley Brooking, PTA 08/07/21 9:05 AM

## 2021-08-09 ENCOUNTER — Encounter: Payer: Medicaid Other | Admitting: Physical Therapy

## 2021-08-09 ENCOUNTER — Other Ambulatory Visit: Payer: Self-pay

## 2021-08-09 ENCOUNTER — Emergency Department (HOSPITAL_COMMUNITY): Payer: Medicaid Other

## 2021-08-09 ENCOUNTER — Observation Stay (HOSPITAL_COMMUNITY)
Admission: EM | Admit: 2021-08-09 | Discharge: 2021-08-10 | Disposition: A | Discharge status: Home / Self Care | Payer: Medicaid Other | Attending: Internal Medicine | Admitting: Internal Medicine

## 2021-08-09 ENCOUNTER — Encounter (HOSPITAL_COMMUNITY): Payer: Self-pay | Admitting: Emergency Medicine

## 2021-08-09 ENCOUNTER — Observation Stay (HOSPITAL_COMMUNITY)
Admit: 2021-08-09 | Discharge: 2021-08-09 | Disposition: A | Discharge status: Home / Self Care | Payer: Medicaid Other | Attending: Internal Medicine | Admitting: Internal Medicine

## 2021-08-09 DIAGNOSIS — Z7902 Long term (current) use of antithrombotics/antiplatelets: Secondary | ICD-10-CM | POA: Insufficient documentation

## 2021-08-09 DIAGNOSIS — R531 Weakness: Secondary | ICD-10-CM | POA: Diagnosis present

## 2021-08-09 DIAGNOSIS — Z794 Long term (current) use of insulin: Secondary | ICD-10-CM | POA: Diagnosis not present

## 2021-08-09 DIAGNOSIS — G9340 Encephalopathy, unspecified: Secondary | ICD-10-CM | POA: Diagnosis not present

## 2021-08-09 DIAGNOSIS — I10 Essential (primary) hypertension: Secondary | ICD-10-CM | POA: Diagnosis not present

## 2021-08-09 DIAGNOSIS — Z7984 Long term (current) use of oral hypoglycemic drugs: Secondary | ICD-10-CM | POA: Diagnosis not present

## 2021-08-09 DIAGNOSIS — E785 Hyperlipidemia, unspecified: Secondary | ICD-10-CM | POA: Diagnosis present

## 2021-08-09 DIAGNOSIS — J449 Chronic obstructive pulmonary disease, unspecified: Secondary | ICD-10-CM | POA: Diagnosis not present

## 2021-08-09 DIAGNOSIS — R400 Somnolence: Secondary | ICD-10-CM

## 2021-08-09 DIAGNOSIS — I2510 Atherosclerotic heart disease of native coronary artery without angina pectoris: Secondary | ICD-10-CM | POA: Diagnosis not present

## 2021-08-09 DIAGNOSIS — R42 Dizziness and giddiness: Secondary | ICD-10-CM | POA: Diagnosis not present

## 2021-08-09 DIAGNOSIS — M5020 Other cervical disc displacement, unspecified cervical region: Secondary | ICD-10-CM | POA: Diagnosis present

## 2021-08-09 DIAGNOSIS — D72829 Elevated white blood cell count, unspecified: Secondary | ICD-10-CM | POA: Diagnosis not present

## 2021-08-09 DIAGNOSIS — Z8673 Personal history of transient ischemic attack (TIA), and cerebral infarction without residual deficits: Secondary | ICD-10-CM | POA: Insufficient documentation

## 2021-08-09 DIAGNOSIS — Z79899 Other long term (current) drug therapy: Secondary | ICD-10-CM | POA: Insufficient documentation

## 2021-08-09 DIAGNOSIS — Z87891 Personal history of nicotine dependence: Secondary | ICD-10-CM | POA: Diagnosis not present

## 2021-08-09 DIAGNOSIS — E119 Type 2 diabetes mellitus without complications: Secondary | ICD-10-CM | POA: Diagnosis not present

## 2021-08-09 LAB — HIV ANTIBODY (ROUTINE TESTING W REFLEX): HIV Screen 4th Generation wRfx: NONREACTIVE

## 2021-08-09 LAB — COMPREHENSIVE METABOLIC PANEL
ALT: 24 U/L (ref 0–44)
AST: 22 U/L (ref 15–41)
Albumin: 3.9 g/dL (ref 3.5–5.0)
Alkaline Phosphatase: 49 U/L (ref 38–126)
Anion gap: 10 (ref 5–15)
BUN: 9 mg/dL (ref 6–20)
CO2: 24 mmol/L (ref 22–32)
Calcium: 9.3 mg/dL (ref 8.9–10.3)
Chloride: 103 mmol/L (ref 98–111)
Creatinine, Ser: 0.53 mg/dL — ABNORMAL LOW (ref 0.61–1.24)
GFR, Estimated: >60 mL/min (ref >60)
Glucose, Bld: 195 mg/dL — ABNORMAL HIGH (ref 70–99)
Potassium: 5 mmol/L (ref 3.5–5.1)
Sodium: 137 mmol/L (ref 135–145)
Total Bilirubin: 0.6 mg/dL (ref 0.3–1.2)
Total Protein: 7.2 g/dL (ref 6.5–8.1)

## 2021-08-09 LAB — URINALYSIS, ROUTINE W REFLEX MICROSCOPIC
Bacteria, UA: NONE SEEN
Bilirubin Urine: NEGATIVE
Glucose, UA: >=500 mg/dL — AB
Hgb urine dipstick: NEGATIVE
Ketones, ur: 5 mg/dL — AB
Leukocytes,Ua: NEGATIVE
Nitrite: NEGATIVE
Protein, ur: NEGATIVE mg/dL
Specific Gravity, Urine: 1.025 (ref 1.005–1.030)
pH: 5 (ref 5.0–8.0)

## 2021-08-09 LAB — BLOOD GAS, VENOUS
Acid-Base Excess: 5 mmol/L — ABNORMAL HIGH (ref 0.0–2.0)
Bicarbonate: 31 mmol/L — ABNORMAL HIGH (ref 20.0–28.0)
Drawn by: 27160
FIO2: 28 %
O2 Saturation: 52.9 %
Patient temperature: 36.5
pCO2, Ven: 49 mmHg (ref 44–60)
pH, Ven: 7.41 (ref 7.25–7.43)
pO2, Ven: 33 mmHg (ref 32–45)

## 2021-08-09 LAB — RAPID URINE DRUG SCREEN, HOSP PERFORMED
Amphetamines: NOT DETECTED
Barbiturates: NOT DETECTED
Benzodiazepines: NOT DETECTED
Cocaine: NOT DETECTED
Opiates: NOT DETECTED
Tetrahydrocannabinol: NOT DETECTED

## 2021-08-09 LAB — ETHANOL: Alcohol, Ethyl (B): <10 mg/dL (ref <10)

## 2021-08-09 LAB — CBC
HCT: 41.5 % (ref 39.0–52.0)
Hemoglobin: 13.5 g/dL (ref 13.0–17.0)
MCH: 27.3 pg (ref 26.0–34.0)
MCHC: 32.5 g/dL (ref 30.0–36.0)
MCV: 83.8 fL (ref 80.0–100.0)
Platelets: 433 10*3/uL — ABNORMAL HIGH (ref 150–400)
RBC: 4.95 MIL/uL (ref 4.22–5.81)
RDW: 14.6 % (ref 11.5–15.5)
WBC: 12.8 10*3/uL — ABNORMAL HIGH (ref 4.0–10.5)
nRBC: 0 % (ref 0.0–0.2)

## 2021-08-09 LAB — TROPONIN I (HIGH SENSITIVITY)
Troponin I (High Sensitivity): 2 ng/L (ref <18)
Troponin I (High Sensitivity): 2 ng/L (ref <18)

## 2021-08-09 LAB — VITAMIN B12: Vitamin B-12: 213 pg/mL (ref 180–914)

## 2021-08-09 LAB — AMMONIA: Ammonia: 40 umol/L — ABNORMAL HIGH (ref 9–35)

## 2021-08-09 LAB — GLUCOSE, CAPILLARY
Glucose-Capillary: 255 mg/dL — ABNORMAL HIGH (ref 70–99)
Glucose-Capillary: 290 mg/dL — ABNORMAL HIGH (ref 70–99)

## 2021-08-09 LAB — TSH: TSH: 1.376 u[IU]/mL (ref 0.350–4.500)

## 2021-08-09 MED ORDER — INSULIN ASPART 100 UNIT/ML IJ SOLN
0.0000 [IU] | Freq: Every day | INTRAMUSCULAR | Status: DC
  Administered 2021-08-09: 3 [IU] via SUBCUTANEOUS

## 2021-08-09 MED ORDER — ACETAMINOPHEN 325 MG PO TABS
650.0000 mg | ORAL_TABLET | Freq: Four times a day (QID) | ORAL | Status: DC | PRN
  Administered 2021-08-09 – 2021-08-10 (×2): 650 mg via ORAL
  Filled 2021-08-09 (×2): qty 2

## 2021-08-09 MED ORDER — ACETAMINOPHEN 650 MG RE SUPP: 650.0000 mg | Freq: Four times a day (QID) | RECTAL | Status: DC | PRN

## 2021-08-09 MED ORDER — FOLIC ACID 1 MG PO TABS
1.0000 mg | ORAL_TABLET | Freq: Every day | ORAL | Status: DC
  Administered 2021-08-10: 1 mg via ORAL
  Filled 2021-08-09: qty 1

## 2021-08-09 MED ORDER — LOSARTAN POTASSIUM 50 MG PO TABS
50.0000 mg | ORAL_TABLET | Freq: Every day | ORAL | Status: DC
  Administered 2021-08-10: 50 mg via ORAL
  Filled 2021-08-09: qty 1

## 2021-08-09 MED ORDER — INSULIN ASPART 100 UNIT/ML IJ SOLN
4.0000 [IU] | Freq: Three times a day (TID) | INTRAMUSCULAR | Status: DC
Start: 2021-08-09 — End: 2021-08-10
  Administered 2021-08-10: 4 [IU] via SUBCUTANEOUS

## 2021-08-09 MED ORDER — ROSUVASTATIN CALCIUM 20 MG PO TABS
20.0000 mg | ORAL_TABLET | Freq: Every day | ORAL | Status: DC
  Administered 2021-08-10: 20 mg via ORAL
  Filled 2021-08-09: qty 1

## 2021-08-09 MED ORDER — THIAMINE HCL 100 MG PO TABS
250.0000 mg | ORAL_TABLET | Freq: Every day | ORAL | Status: DC
  Administered 2021-08-10: 250 mg via ORAL
  Filled 2021-08-09: qty 3

## 2021-08-09 MED ORDER — LACTATED RINGERS IV SOLN: INTRAVENOUS | Status: AC

## 2021-08-09 MED ORDER — ONDANSETRON HCL 4 MG/2ML IJ SOLN: 4.0000 mg | Freq: Four times a day (QID) | INTRAMUSCULAR | Status: DC | PRN

## 2021-08-09 MED ORDER — ONDANSETRON HCL 4 MG PO TABS: 4.0000 mg | ORAL_TABLET | Freq: Four times a day (QID) | ORAL | Status: DC | PRN

## 2021-08-09 MED ORDER — ENOXAPARIN SODIUM 40 MG/0.4ML IJ SOSY
40.0000 mg | PREFILLED_SYRINGE | INTRAMUSCULAR | Status: DC
Start: 2021-08-09 — End: 2021-08-10
  Administered 2021-08-09: 40 mg via SUBCUTANEOUS
  Filled 2021-08-09: qty 0.4

## 2021-08-09 MED ORDER — CLOPIDOGREL BISULFATE 75 MG PO TABS
75.0000 mg | ORAL_TABLET | Freq: Every day | ORAL | Status: DC
  Administered 2021-08-10: 75 mg via ORAL
  Filled 2021-08-09: qty 1

## 2021-08-09 MED ORDER — INSULIN ASPART 100 UNIT/ML IJ SOLN
0.0000 [IU] | Freq: Three times a day (TID) | INTRAMUSCULAR | Status: DC
  Administered 2021-08-09: 8 [IU] via SUBCUTANEOUS
  Administered 2021-08-10: 3 [IU] via SUBCUTANEOUS

## 2021-08-09 MED ORDER — IPRATROPIUM-ALBUTEROL 0.5-2.5 (3) MG/3ML IN SOLN: 3.0000 mL | Freq: Four times a day (QID) | RESPIRATORY_TRACT | Status: DC | PRN

## 2021-08-09 MED ORDER — METOPROLOL SUCCINATE ER 25 MG PO TB24
12.5000 mg | ORAL_TABLET | Freq: Every day | ORAL | Status: DC
  Administered 2021-08-10: 12.5 mg via ORAL
  Filled 2021-08-09: qty 1

## 2021-08-09 MED ORDER — MONTELUKAST SODIUM 10 MG PO TABS
10.0000 mg | ORAL_TABLET | Freq: Every day | ORAL | Status: DC
  Administered 2021-08-09: 10 mg via ORAL
  Filled 2021-08-09: qty 1

## 2021-08-09 NOTE — ED Triage Notes (Addendum)
Pt to the ED from Home with [facility]. Pt originally called out for chest pain, but he now denies.   The patient's wife states he drank in excess yesterday but she does not want him to know she is aware.  The pt admits to drinking a six pack of beer yesterday, but denies any illegal drug use recently.  Pt denies pain but states he feels, "Weird." EMS reports NIH negative.

## 2021-08-09 NOTE — H&P (Signed)
History and Physical    Robert Rich:[id number] DOB: December 02, 1965 DOA: 08/09/2021  PCP: Rebecka Apley, NP   Patient coming from: Home  Chief Complaint: Weakness and fall  HPI: Robert Rich is a 56 y.o. male with medical history significant for CAD with prior MI, dyslipidemia, hypertension, COPD, intermittent alcohol abuse, obesity, type 2 diabetes, and prior CVA who presented to the ED with noted weakness and dizziness and fall this AM.  He overall states that he continues to feel "weird."  He denies any headaches, visual changes, or focal deficits such as weakness in his arms or legs in particular.  He did drink quite excessively yesterday and had a sixpack of beer which she normally does not do and he denies any use of illicit drugs.  He otherwise takes his routine home medications.  No incontinence or seizure activity described.  Patient denies any fevers or chills.  Patient denies any sinus symptoms.   ED Course: Stable vital signs noted and patient is afebrile.  Leukocytosis of 12,800 noted and CT head negative for any acute findings aside from some left maxillary sinusitis.  UDS negative for acute findings and alcohol level low.  Ammonia minimally elevated.  Review of Systems: Reviewed as noted above, otherwise negative.  Past Medical History:  Diagnosis Date   COPD (chronic obstructive pulmonary disease) (HCC)    Coronary artery disease    DM (diabetes mellitus) (HCC)    Dyspnea    Heart attack (HCC)    HLD (hyperlipidemia)    HTN (hypertension)    Obesity    Stroke [facility])     Past Surgical History:  Procedure Laterality Date   ANTERIOR CERVICAL DECOMPRESSION/DISCECTOMY FUSION 4 LEVELS N/A 06/10/2021   Procedure: Anterior cervical discectomy and fusion Cervical four to seven;  Surgeon: Venita Lick, MD;  Location: [facility] OR;  Service: Orthopedics;  Laterality: N/A;   INTRAVASCULAR PRESSURE WIRE/FFR STUDY N/A 03/18/2019   Procedure: INTRAVASCULAR PRESSURE WIRE/FFR  STUDY;  Surgeon: [name], MD;  Location: [facility] INVASIVE CV LAB;  Service: Cardiovascular;  Laterality: N/A;   LEFT HEART CATH AND CORONARY ANGIOGRAPHY N/A 03/18/2019   Procedure: LEFT HEART CATH AND CORONARY ANGIOGRAPHY;  Surgeon: [name], MD;  Location: [facility] INVASIVE CV LAB;  Service: Cardiovascular;  Laterality: N/A;   NO PAST SURGERIES       reports that he quit smoking about 2 years ago. His smoking use included cigarettes. He has a 2.00 pack-year smoking history. He has never used smokeless tobacco. He reports that he does not currently use alcohol after a past usage of about 12.0 standard drinks of alcohol per week. He reports that he does not currently use drugs.  Allergies  Allergen Reactions   Asa [Aspirin] Hives   Zolmitriptan Nausea And Vomiting and Other (See Comments)    Severe headaches    Family History  Problem Relation Age of Onset   Heart disease Mother    Heart failure Mother    Atrial fibrillation Mother    Kidney disease Father     Prior to Admission medications   Medication Sig Start Date End Date Taking? Authorizing Provider  albuterol (VENTOLIN HFA) 108 (90 Base) MCG/ACT inhaler Inhale 1-2 puffs into the lungs every 6 (six) hours as needed for wheezing or shortness of breath.   Yes [provider]  ANORO ELLIPTA 62.5-25 MCG/ACT AEPB Inhale 1 puff into the lungs daily. 02/19/21  Yes [provider]  clopidogrel (PLAVIX) 75 MG tablet Take 75 mg  by mouth daily. 07/22/21  Yes [provider]  FARXIGA 10 MG TABS tablet Take 10 mg by mouth daily. 04/15/21  Yes [provider]  folic acid (FOLVITE) 1 MG tablet Take 1 tablet (1 mg total) by mouth daily. 07/22/19  Yes [name], MD  ipratropium-albuterol (DUONEB) 0.5-2.5 (3) MG/3ML SOLN Inhale 3 mLs into the lungs every 6 (six) hours as needed (shortness of breath). 04/20/18  Yes [provider]  losartan (COZAAR) 50 MG tablet Take 50 mg by mouth daily. 02/10/21  Yes [provider]  metFORMIN (GLUCOPHAGE) 1000 MG tablet Take 1 tablet (1,000 mg total) by mouth 2 (two) times daily with a meal. 07/22/19  Yes [name], MD  metoprolol succinate (TOPROL-XL) 25 MG 24 hr tablet Take 1 tablet (25 mg total) by mouth daily. Patient taking differently: Take 12.5 mg by mouth daily. 07/22/19  Yes [name], MD  montelukast (SINGULAIR) 10 MG tablet Take 1 tablet (10 mg total) by mouth at bedtime. 08/11/19  Yes [name], [name], MD  oxyCODONE-acetaminophen (PERCOCET/ROXICET) 5-325 MG tablet Take 1 tablet by mouth 3 (three) times daily as needed for pain. 07/23/21  Yes [provider]  pregabalin (LYRICA) 75 MG capsule Take 75 mg by mouth 2 (two) times daily. 04/02/21  Yes [provider]  rosuvastatin (CRESTOR) 20 MG tablet Take 20 mg by mouth daily.   Yes [provider]  thiamine 100 MG tablet Take 2.5 tablets (250 mg total) by mouth daily. Patient taking differently: Take 100 mg by mouth daily. 07/22/19  Yes [name], MD  insulin NPH-regular Human (NOVOLIN 70/30) (70-30) 100 UNIT/ML injection Inject 50 Units into the skin 2 (two) times daily with a meal. Patient not taking: Reported on 08/09/2021 08/11/19   [name], MD  ondansetron (ZOFRAN) 4 MG tablet Take 1 tablet (4 mg total) by mouth every 8 (eight) hours as needed for nausea or vomiting. Patient not taking: Reported on 08/09/2021 06/10/21   Venita Lick, MD    Physical Exam: Vitals:   08/09/21 1200 08/09/21 1215 08/09/21 1230 08/09/21 1330  BP: 124/87  129/86 (!) 121/57  Pulse: 77 75 74 70  Resp: 11 11 15 14   Temp:      TempSrc:      SpO2: 98% 98% 97% 97%  Weight:      Height:        Constitutional: Somnolent, but arousable to palpation. Vitals:   08/09/21 1200 08/09/21 1215 08/09/21 1230 08/09/21 1330  BP: 124/87  129/86 (!) 121/57  Pulse: 77 75 74 70  Resp: 11 11 15 14   Temp:      TempSrc:      SpO2: 98% 98% 97% 97%  Weight:      Height:       Eyes: lids and  conjunctivae normal Neck: normal, supple Respiratory: clear to auscultation bilaterally. Normal respiratory effort. No accessory muscle use.  Nasal cannula Cardiovascular: Regular rate and rhythm, no murmurs. Abdomen: no tenderness, no distention. Bowel sounds positive.  Musculoskeletal:  No edema. Skin: no rashes, lesions, ulcers.  Psychiatric: Flat affect  Labs on Admission: I have personally reviewed following labs and imaging studies  CBC: Recent Labs  Lab 08/09/21 1037  WBC 12.8*  HGB 13.5  HCT 41.5  MCV 83.8  PLT 433*   Basic Metabolic Panel: Recent Labs  Lab 08/09/21 1037  NA 137  K 5.0  CL 103  CO2 24  GLUCOSE 195*  BUN 9  CREATININE 0.53*  CALCIUM 9.3   GFR: Estimated Creatinine Clearance: 122.3 mL/min (A) (by C-G formula based on SCr of 0.53 mg/dL (L)). Liver Function Tests: Recent Labs  Lab 08/09/21 1037  AST 22  ALT 24  ALKPHOS 49  BILITOT 0.6  PROT 7.2  ALBUMIN 3.9   No results for input(s): "LIPASE", "AMYLASE" in the last 168 hours. Recent Labs  Lab 08/09/21 1037  AMMONIA 40*   Coagulation Profile: No results for input(s): "INR", "PROTIME" in the last 168 hours. Cardiac Enzymes: No results for input(s): "CKTOTAL", "CKMB", "CKMBINDEX", "TROPONINI" in the last 168 hours. BNP (last 3 results) No results for input(s): "PROBNP" in the last 8760 hours. HbA1C: No results for input(s): "HGBA1C" in the last 72 hours. CBG: No results for input(s): "GLUCAP" in the last 168 hours. Lipid Profile: No results for input(s): "CHOL", "HDL", "LDLCALC", "TRIG", "CHOLHDL", "LDLDIRECT" in the last 72 hours. Thyroid Function Tests: No results for input(s): "TSH", "T4TOTAL", "FREET4", "T3FREE", "THYROIDAB" in the last 72 hours. Anemia Panel: No results for input(s): "VITAMINB12", "FOLATE", "FERRITIN", "TIBC", "IRON", "RETICCTPCT" in the last 72 hours. Urine analysis:    Component Value Date/Time   COLORURINE STRAW (A) 08/09/2021 1018   APPEARANCEUR CLEAR  08/09/2021 1018   LABSPEC 1.025 08/09/2021 1018   PHURINE 5.0 08/09/2021 1018   GLUCOSEU >=500 (A) 08/09/2021 1018   HGBUR NEGATIVE 08/09/2021 1018   BILIRUBINUR NEGATIVE 08/09/2021 1018   KETONESUR 5 (A) 08/09/2021 1018   PROTEINUR NEGATIVE 08/09/2021 1018   NITRITE NEGATIVE 08/09/2021 1018   LEUKOCYTESUR NEGATIVE 08/09/2021 1018    Radiological Exams on Admission: CT Head Wo Contrast  Result Date: 08/09/2021 CLINICAL DATA:  Altered mental status, dizziness EXAM: CT HEAD WITHOUT CONTRAST TECHNIQUE: Contiguous axial images were obtained from the base of the skull through the vertex without intravenous contrast. RADIATION DOSE REDUCTION: This exam was performed according to the departmental dose-optimization program which includes automated exposure control, adjustment of the mA and/or kV according to patient size and/or use of iterative reconstruction technique. COMPARISON:  07/04/2021 FINDINGS: Brain: No acute intracranial findings are seen. There are no signs of bleeding within the cranium. There is old infarct in the posteromedial right cerebellum with no interval change. Vascular: Unremarkable. Skull: Unremarkable. Sinuses/Orbits: There is mucosal thickening in ethmoid, sphenoid and maxillary sinuses more so in the right maxillary sinus with interval worsening. Other: None. IMPRESSION: No acute intracranial findings are seen in noncontrast CT brain. Chronic sinusitis, more severe in left maxillary sinus with interval worsening. Electronically Signed   By: Ernie Avena M.D.   On: 08/09/2021 12:52    EKG: Independently reviewed. SR 87bpm.  Assessment/Plan Principal Problem:   Acute encephalopathy Active Problems:   Coronary artery disease   HLD (hyperlipidemia)   COPD (chronic obstructive pulmonary disease) (HCC)   Type 2 diabetes mellitus without complication (HCC)   Cervical disc herniation    Acute encephalopathy with dizziness -Uncertain etiology at this point -Noted to  have prior CVAs, obtain brain MRI -Obtain EEG to rule out seizure -CT head and urine toxicology negative -Ammonia noted to be 40, but unlikely that this is causing his symptoms, monitor -Check TSH, VBG, and B12 as well as thiamine  Intermittent alcohol abuse -Continue to observe and use CIWA protocol as needed  Leukocytosis -Likely reactive and/or related to some chronic sinusitis, but otherwise asymptomatic -Monitor CBC  History of COPD with prior tobacco abuse -DuoNebs as needed -No acute bronchospasms currently  Type 2 diabetes -Continue home medications with exception of metformin -SSI -  Carb modified diet  CAD/hypertension/dyslipidemia -Continue home medications  Obesity -BMI 32.93 -Lifestyle changes outpatient   DVT prophylaxis: Lovenox Code Status: Full Family Communication: Wife at bedside Disposition Plan:Admit for evaluation of encephalopathy Consults called:None Admission status: Obs, Tele  Severity of Illness: The appropriate patient status for this patient is OBSERVATION. Observation status is judged to be reasonable and necessary in order to provide the required intensity of service to ensure the patient's safety. The patient's presenting symptoms, physical exam findings, and initial radiographic and laboratory data in the context of their medical condition is felt to place them at decreased risk for further clinical deterioration. Furthermore, it is anticipated that the patient will be medically stable for discharge from the hospital within 2 midnights of admission.    Jermall Isaacson D Sherryll Burger DO [facility]  If 7PM-7AM, please contact night-coverage [email or web address]  08/09/2021, 2:45 PM

## 2021-08-09 NOTE — Progress Notes (Signed)
[  Pt. Off unit for CT

## 2021-08-09 NOTE — Progress Notes (Signed)
Pt returned to room via WC from MRI. Pt continues with weakness of right leg, states, "this leg just won't do nothing I tell it to!" Pt does drag right leg with ambulation and does require one assist for stability.

## 2021-08-09 NOTE — ED Provider Notes (Signed)
[facility] EMERGENCY DEPARTMENT Provider Note   CSN: [id number] Arrival date & time: 08/09/21  0901     History  Chief Complaint  Patient presents with   Weakness    Robert Rich is a 56 y.o. male presenting to ED complaining of feeling "weird".  The patient reports he felt fine yesterday but he woke up this morning stating that he feels extremely "weird".  He says he has a feeling like he is moving in slow motion, and out of body feeling.  He denies headache.  He denies chest pain.  He says never had this sensation before.  He is here with his wife at bedside.  The patient's wife told the triage or nursing staff in private that she believes the patient was working out in excess yesterday, and the patient reports to drinking about a sixpack of beer.  The patient adamantly denies using any illicit substances, reporting "I do not do drugs".  HPI     Home Medications Prior to Admission medications   Medication Sig Start Date End Date Taking? Authorizing Provider  albuterol (VENTOLIN HFA) 108 (90 Base) MCG/ACT inhaler Inhale 1-2 puffs into the lungs every 6 (six) hours as needed for wheezing or shortness of breath.   Yes [provider]  ANORO ELLIPTA 62.5-25 MCG/ACT AEPB Inhale 1 puff into the lungs daily. 02/19/21  Yes [provider]  clopidogrel (PLAVIX) 75 MG tablet Take 75 mg by mouth daily. 07/22/21  Yes [provider]  FARXIGA 10 MG TABS tablet Take 10 mg by mouth daily. 04/15/21  Yes [provider]  folic acid (FOLVITE) 1 MG tablet Take 1 tablet (1 mg total) by mouth daily. 07/22/19  Yes [name], MD  ipratropium-albuterol (DUONEB) 0.5-2.5 (3) MG/3ML SOLN Inhale 3 mLs into the lungs every 6 (six) hours as needed (shortness of breath). 04/20/18  Yes [provider]  losartan (COZAAR) 50 MG tablet Take 50 mg by mouth daily. 02/10/21  Yes [provider]  metFORMIN (GLUCOPHAGE) 1000 MG tablet Take 1 tablet (1,000 mg total) by  mouth 2 (two) times daily with a meal. 07/22/19  Yes [name], MD  metoprolol succinate (TOPROL-XL) 25 MG 24 hr tablet Take 1 tablet (25 mg total) by mouth daily. Patient taking differently: Take 12.5 mg by mouth daily. 07/22/19  Yes [name], MD  montelukast (SINGULAIR) 10 MG tablet Take 1 tablet (10 mg total) by mouth at bedtime. 08/11/19  Yes [name], [name], MD  oxyCODONE-acetaminophen (PERCOCET/ROXICET) 5-325 MG tablet Take 1 tablet by mouth 3 (three) times daily as needed for pain. 07/23/21  Yes [provider]  pregabalin (LYRICA) 75 MG capsule Take 75 mg by mouth 2 (two) times daily. 04/02/21  Yes [provider]  rosuvastatin (CRESTOR) 20 MG tablet Take 20 mg by mouth daily.   Yes [provider]  thiamine 100 MG tablet Take 2.5 tablets (250 mg total) by mouth daily. Patient taking differently: Take 100 mg by mouth daily. 07/22/19  Yes [name], MD  insulin NPH-regular Human (NOVOLIN 70/30) (70-30) 100 UNIT/ML injection Inject 50 Units into the skin 2 (two) times daily with a meal. Patient not taking: Reported on 08/09/2021 08/11/19   [name], MD  ondansetron (ZOFRAN) 4 MG tablet Take 1 tablet (4 mg total) by mouth every 8 (eight) hours as needed for nausea or vomiting. Patient not taking: Reported on 08/09/2021 06/10/21   Venita Lick, MD      Allergies    Jonne Ply [  aspirin] and Zolmitriptan    Review of Systems   Review of Systems  Physical Exam Updated Vital Signs BP 134/77 (BP Location: Left Arm)   Pulse 75   Temp (!) 97.3 F (36.3 C) (Oral)   Resp 18   Ht 5\' 9"  (1.753 m)   Wt 105.2 kg   SpO2 99%   BMI 34.26 kg/m  Physical Exam Constitutional:      General: He is not in acute distress.    Comments: Somnolent  HENT:     Head: Normocephalic and atraumatic.  Eyes:     Conjunctiva/sclera: Conjunctivae normal.     Pupils: Pupils are equal, round, and reactive to light.  Cardiovascular:     Rate and Rhythm: Normal rate and regular rhythm.   Pulmonary:     Effort: Pulmonary effort is normal. No respiratory distress.  Abdominal:     General: There is no distension.     Tenderness: There is no abdominal tenderness.  Skin:    General: Skin is warm and dry.  Neurological:     General: No focal deficit present.     Mental Status: He is alert and oriented to person, place, and time. Mental status is at baseline.     Sensory: No sensory deficit.     Motor: No weakness.  Psychiatric:        Mood and Affect: Mood normal.        Behavior: Behavior normal.     ED Results / Procedures / Treatments   Labs (all labs ordered are listed, but only abnormal results are displayed) Labs Reviewed  AMMONIA - Abnormal; Notable for the following components:      Result Value   Ammonia 40 (*)    All other components within normal limits  COMPREHENSIVE METABOLIC PANEL - Abnormal; Notable for the following components:   Glucose, Bld 195 (*)    Creatinine, Ser 0.53 (*)    All other components within normal limits  CBC - Abnormal; Notable for the following components:   WBC 12.8 (*)    Platelets 433 (*)    All other components within normal limits  URINALYSIS, ROUTINE W REFLEX MICROSCOPIC - Abnormal; Notable for the following components:   Color, Urine STRAW (*)    Glucose, UA >=500 (*)    Ketones, ur 5 (*)    All other components within normal limits  BLOOD GAS, VENOUS - Abnormal; Notable for the following components:   Bicarbonate 31.0 (*)    Acid-Base Excess 5.0 (*)    All other components within normal limits  GLUCOSE, CAPILLARY - Abnormal; Notable for the following components:   Glucose-Capillary 255 (*)    All other components within normal limits  ETHANOL  RAPID URINE DRUG SCREEN, HOSP PERFORMED  TSH  VITAMIN B12  HIV ANTIBODY (ROUTINE TESTING W REFLEX)  VITAMIN B1  TROPONIN I (HIGH SENSITIVITY)  TROPONIN I (HIGH SENSITIVITY)    EKG EKG Interpretation  Date/Time:  Friday August 09 2021 09:13:29 EDT Ventricular Rate:   87 PR Interval:  179 QRS Duration: 85 QT Interval:  347 QTC Calculation: 418 R Axis:   -35 Text Interpretation: Sinus rhythm Left axis deviation Confirmed by [id number] [phone]) on 08/09/2021 9:55:06 AM  Radiology CT Head Wo Contrast  Result Date: 08/09/2021 CLINICAL DATA:  Altered mental status, dizziness EXAM: CT HEAD WITHOUT CONTRAST TECHNIQUE: Contiguous axial images were obtained from the base of the skull through the vertex without intravenous contrast. RADIATION DOSE REDUCTION: This exam was  performed according to the departmental dose-optimization program which includes automated exposure control, adjustment of the mA and/or kV according to patient size and/or use of iterative reconstruction technique. COMPARISON:  07/04/2021 FINDINGS: Brain: No acute intracranial findings are seen. There are no signs of bleeding within the cranium. There is old infarct in the posteromedial right cerebellum with no interval change. Vascular: Unremarkable. Skull: Unremarkable. Sinuses/Orbits: There is mucosal thickening in ethmoid, sphenoid and maxillary sinuses more so in the right maxillary sinus with interval worsening. Other: None. IMPRESSION: No acute intracranial findings are seen in noncontrast CT brain. Chronic sinusitis, more severe in left maxillary sinus with interval worsening. Electronically Signed   By: Ernie Avena M.D.   On: 08/09/2021 12:52    Procedures Procedures    Medications Ordered in ED Medications  losartan (COZAAR) tablet 50 mg (has no administration in time range)  metoprolol succinate (TOPROL-XL) 24 hr tablet 12.5 mg (has no administration in time range)  rosuvastatin (CRESTOR) tablet 20 mg (has no administration in time range)  clopidogrel (PLAVIX) tablet 75 mg (has no administration in time range)  folic acid (FOLVITE) tablet 1 mg (has no administration in time range)  thiamine tablet 250 mg (has no administration in time range)  montelukast (SINGULAIR) tablet  10 mg (has no administration in time range)  ipratropium-albuterol (DUONEB) 0.5-2.5 (3) MG/3ML nebulizer solution 3 mL (has no administration in time range)  enoxaparin (LOVENOX) injection 40 mg (has no administration in time range)  lactated ringers infusion ( Intravenous New Bag/Given 08/09/21 1610)  acetaminophen (TYLENOL) tablet 650 mg (has no administration in time range)    Or  acetaminophen (TYLENOL) suppository 650 mg (has no administration in time range)  ondansetron (ZOFRAN) tablet 4 mg (has no administration in time range)    Or  ondansetron (ZOFRAN) injection 4 mg (has no administration in time range)  insulin aspart (novoLOG) injection 0-15 Units (has no administration in time range)  insulin aspart (novoLOG) injection 0-5 Units (has no administration in time range)  insulin aspart (novoLOG) injection 4 Units (has no administration in time range)    ED Course/ Medical Decision Making/ A&P Clinical Course as of 08/09/21 1633  Fri Aug 09, 2021  1415 Patient continues to feel woozy, is excessively somnolent, and difficulty awakening him and requiring repeated sternal rub.  He did become transiently hypoxic while sleeping which I think may be related to sleep apnea.  He does snore at night.  At this point there is no clear diagnosis for his somnolence and "wooziness".  I have discussed admission with him and his wife and both are agreeable. [MT]    Clinical Course User Index [MT] Noami Bove, Kermit Balo, MD                           Medical Decision Making Amount and/or Complexity of Data Reviewed Labs: ordered. Radiology: ordered.  Risk Decision regarding hospitalization.   This patient presents to the Emergency Department with complaint of altered mental status.  This involves an extensive number of treatment options, and is a complaint that carries with it a high risk of complications and morbidity.  The differential diagnosis includes hypoglycemia vs metabolic encephalopathy vs  infection (including cystitis) vs vs polypharmacy vs drug toxidrome vs other  His clinical presentation and description to be most consistent with some type of acute drug intoxication, which could include benzos, marijuana, mushroom, or even alcohol intoxication.  The out of body sensation in  the feeling like he is moving in slow motion to be consistent with this.  It is possible that he ingested something without realizing it.  We will check a UDS.  Also an ethanol level.  He does not have localizing symptoms of stroke, or seizure.  This is not consistent with acute bacterial meningitis.  I ordered, reviewed, and interpreted labs, including WBC 12.8 (chronically elevated), ammonoia 40, etoh neg, trop neg, UDS neg I ordered imaging studies which included CTH  I independently visualized and interpreted imaging which showed no acute abnormalities, and the monitor tracing which showed NSR Additional hx from pt's wife at bedside I personally reviewed the patients ECG which showed sinus rhythm with no acute ischemic findings  After the interventions stated above, I reevaluated the patient and found he remains somnolent, reporting dizziness - will admit to hospitalist, agree with MRI brain to evaluate for cerebellar lesion.         Final Clinical Impression(s) / ED Diagnoses Final diagnoses:  Dizziness  Somnolence    Rx / DC Orders ED Discharge Orders     None         Anurag Scarfo, Kermit Balo, MD 08/09/21 [phone]

## 2021-08-09 NOTE — Progress Notes (Signed)
EEG completed, results pending. 

## 2021-08-10 DIAGNOSIS — G9340 Encephalopathy, unspecified: Secondary | ICD-10-CM | POA: Diagnosis not present

## 2021-08-10 LAB — COMPREHENSIVE METABOLIC PANEL
ALT: 23 U/L (ref 0–44)
AST: 20 U/L (ref 15–41)
Albumin: 3.5 g/dL (ref 3.5–5.0)
Alkaline Phosphatase: 40 U/L (ref 38–126)
Anion gap: 5 (ref 5–15)
BUN: 13 mg/dL (ref 6–20)
CO2: 28 mmol/L (ref 22–32)
Calcium: 8.4 mg/dL — ABNORMAL LOW (ref 8.9–10.3)
Chloride: 103 mmol/L (ref 98–111)
Creatinine, Ser: 0.55 mg/dL — ABNORMAL LOW (ref 0.61–1.24)
GFR, Estimated: >60 mL/min (ref >60)
Glucose, Bld: 171 mg/dL — ABNORMAL HIGH (ref 70–99)
Potassium: 4.2 mmol/L (ref 3.5–5.1)
Sodium: 136 mmol/L (ref 135–145)
Total Bilirubin: 0.5 mg/dL (ref 0.3–1.2)
Total Protein: 6.5 g/dL (ref 6.5–8.1)

## 2021-08-10 LAB — CBC
HCT: 37.9 % — ABNORMAL LOW (ref 39.0–52.0)
Hemoglobin: 12.2 g/dL — ABNORMAL LOW (ref 13.0–17.0)
MCH: 27.2 pg (ref 26.0–34.0)
MCHC: 32.2 g/dL (ref 30.0–36.0)
MCV: 84.4 fL (ref 80.0–100.0)
Platelets: 371 10*3/uL (ref 150–400)
RBC: 4.49 MIL/uL (ref 4.22–5.81)
RDW: 14.9 % (ref 11.5–15.5)
WBC: 9.5 10*3/uL (ref 4.0–10.5)
nRBC: 0 % (ref 0.0–0.2)

## 2021-08-10 LAB — GLUCOSE, CAPILLARY
Glucose-Capillary: 196 mg/dL — ABNORMAL HIGH (ref 70–99)
Glucose-Capillary: 193 mg/dL — ABNORMAL HIGH (ref 70–99)

## 2021-08-10 LAB — MAGNESIUM: Magnesium: 1.8 mg/dL (ref 1.7–2.4)

## 2021-08-10 NOTE — Plan of Care (Signed)
  Problem: Acute Rehab PT Goals(only PT should resolve) Goal: Pt Will Go Supine/Side To Sit Outcome: Progressing Flowsheets (Taken 08/10/2021 0944) Pt will go Supine/Side to Sit: with supervision Goal: Patient Will Transfer Sit To/From Stand Outcome: Progressing Flowsheets (Taken 08/10/2021 0944) Patient will transfer sit to/from stand: with supervision Goal: Pt Will Transfer Bed To Chair/Chair To Bed Outcome: Progressing Flowsheets (Taken 08/10/2021 0944) Pt will Transfer Bed to Chair/Chair to Bed: with supervision Goal: Pt Will Ambulate Outcome: Progressing Flowsheets (Taken 08/10/2021 0944) Pt will Ambulate:  > 125 feet  with supervision

## 2021-08-10 NOTE — Procedures (Signed)
Patient Name: Robert Rich  MRN: [id number]  Epilepsy Attending: Charlsie Quest  Referring Physician/Provider: Maurilio Lovely D, DO Date: 08/09/2021 Duration: 23.13 mins  Patient history: 56yo M with ams. EEG to evaluate for seizure.  Level of alertness: Awake, asleep  AEDs during EEG study: None  Technical aspects: This EEG study was done with scalp electrodes positioned according to the 10-20 International system of electrode placement. Electrical activity was acquired at a sampling rate of 500Hz  and reviewed with a high frequency filter of 70Hz  and a low frequency filter of 1Hz . EEG data were recorded continuously and digitally stored.   Description: The posterior dominant rhythm consists of 9-10 Hz activity of moderate voltage (25-35 uV) seen predominantly in posterior head regions, symmetric and reactive to eye opening and eye closing. Sleep was characterized by vertex waves, sleep spindles (12 to 14 Hz), maximal frontocentral region.  Hyperventilation and photic stimulation were not performed.     IMPRESSION: This study is within normal limits. No seizures or epileptiform discharges were seen throughout the recording.  Zuri Bradway 

## 2021-08-10 NOTE — Discharge Summary (Addendum)
Physician Discharge Summary  Robert Rich OZD:[id number] DOB: April 15, 1965 DOA: 08/09/2021  PCP: Rebecka Apley, NP  Admit date: 08/09/2021  Discharge date: 08/10/2021  Admitted From:Home  Disposition:  Home  Recommendations for Outpatient Follow-up:  Follow up with PCP in 1-2 weeks Continue home medications as prior Counseled on abstaining from alcohol  Home Health: Outpt PT  Equipment/Devices: None  Discharge Condition:Stable  CODE STATUS: Full  Diet recommendation: Heart Healthy/carb modified  Brief/Interim Summary: Robert Rich is a 56 y.o. male with medical history significant for CAD with prior MI, dyslipidemia, hypertension, COPD, intermittent alcohol abuse, obesity, type 2 diabetes, and prior CVA who presented to the ED with noted weakness and dizziness and fall this AM.  He overall states that he continues to feel "weird."  Patient was admitted with suspected acute metabolic encephalopathy and dizziness that may have been related to potential CVA, however brain MRI and EEG have returned negative.  Other lab work is otherwise unremarkable and he is feeling much better today.  He has been seen by PT/OT with no other recommendations and ambulate safely.  He is stable for discharge.  He has been counseled on alcohol cessation which may be contributing to his condition.  Discharge Diagnoses:  Principal Problem:   Acute encephalopathy Active Problems:   Coronary artery disease   HLD (hyperlipidemia)   COPD (chronic obstructive pulmonary disease) (HCC)   Type 2 diabetes mellitus without complication (HCC)   Cervical disc herniation  Principal discharge diagnosis: Acute metabolic encephalopathy likely in the setting of alcohol use-resolved.  Discharge Instructions  Discharge Instructions     Diet - low sodium heart healthy   Complete by: As directed    Increase activity slowly   Complete by: As directed       Allergies as of 08/10/2021       Reactions   Asa  [aspirin] Hives   Zolmitriptan Nausea And Vomiting, Other (See Comments)   Severe headaches        Medication List     TAKE these medications    albuterol 108 (90 Base) MCG/ACT inhaler Commonly known as: VENTOLIN HFA Inhale 1-2 puffs into the lungs every 6 (six) hours as needed for wheezing or shortness of breath.   Anoro Ellipta 62.5-25 MCG/ACT Aepb Generic drug: umeclidinium-vilanterol Inhale 1 puff into the lungs daily.   clopidogrel 75 MG tablet Commonly known as: PLAVIX Take 75 mg by mouth daily.   Farxiga 10 MG Tabs tablet Generic drug: dapagliflozin propanediol Take 10 mg by mouth daily.   folic acid 1 MG tablet Commonly known as: FOLVITE Take 1 tablet (1 mg total) by mouth daily.   ipratropium-albuterol 0.5-2.5 (3) MG/3ML Soln Commonly known as: DUONEB Inhale 3 mLs into the lungs every 6 (six) hours as needed (shortness of breath).   losartan 50 MG tablet Commonly known as: COZAAR Take 50 mg by mouth daily.   metFORMIN 1000 MG tablet Commonly known as: Glucophage Take 1 tablet (1,000 mg total) by mouth 2 (two) times daily with a meal.   metoprolol succinate 25 MG 24 hr tablet Commonly known as: TOPROL-XL Take 1 tablet (25 mg total) by mouth daily. What changed: how much to take   montelukast 10 MG tablet Commonly known as: SINGULAIR Take 1 tablet (10 mg total) by mouth at bedtime.   NovoLIN 70/30 (70-30) 100 UNIT/ML injection Generic drug: insulin NPH-regular Human Inject 50 Units into the skin 2 (two) times daily with a meal.   ondansetron 4  MG tablet Commonly known as: Zofran Take 1 tablet (4 mg total) by mouth every 8 (eight) hours as needed for nausea or vomiting.   oxyCODONE-acetaminophen 5-325 MG tablet Commonly known as: PERCOCET/ROXICET Take 1 tablet by mouth 3 (three) times daily as needed for pain.   pregabalin 75 MG capsule Commonly known as: LYRICA Take 75 mg by mouth 2 (two) times daily.   rosuvastatin 20 MG tablet Commonly  known as: CRESTOR Take 20 mg by mouth daily.   thiamine 100 MG tablet Take 2.5 tablets (250 mg total) by mouth daily. What changed: how much to take        Follow-up Information     Hemberg, Ruby Cola, NP. Schedule an appointment as soon as possible for a visit in 1 week(s).   Specialty: Adult Health Nurse Practitioner Contact information: [address] [place] [address] [phone]                Allergies  Allergen Reactions   Asa [Aspirin] Hives   Zolmitriptan Nausea And Vomiting and Other (See Comments)    Severe headaches    Consultations: None   Procedures/Studies: EEG adult  Result Date: August 23, 2021 Charlsie Quest, MD     08-23-2021  6:57 AM Patient Name: Robert Rich MRN: [id number] Epilepsy Attending: Charlsie Quest Referring Physician/Provider: Maurilio Lovely D, DO Date: 08/09/2021 Duration: 23.13 mins Patient history: 56yo M with ams. EEG to evaluate for seizure. Level of alertness: Awake, asleep AEDs during EEG study: None Technical aspects: This EEG study was done with scalp electrodes positioned according to the 10-20 International system of electrode placement. Electrical activity was acquired at a sampling rate of 500Hz  and reviewed with a high frequency filter of 70Hz  and a low frequency filter of 1Hz . EEG data were recorded continuously and digitally stored. Description: The posterior dominant rhythm consists of 9-10 Hz activity of moderate voltage (25-35 uV) seen predominantly in posterior head regions, symmetric and reactive to eye opening and eye closing. Sleep was characterized by vertex waves, sleep spindles (12 to 14 Hz), maximal frontocentral region.  Hyperventilation and photic stimulation were not performed.   IMPRESSION: This study is within normal limits. No seizures or epileptiform discharges were seen throughout the recording.   MR BRAIN WO CONTRAST  Result Date: 08/09/2021 CLINICAL DATA:  Dizziness,  persistent/recurrent, cardiac or vascular cause suspected vertigo, new onset, evaluate for cerebellar lesion EXAM: MRI HEAD WITHOUT CONTRAST TECHNIQUE: Multiplanar, multiecho pulse sequences of the brain and surrounding structures were obtained without intravenous contrast. COMPARISON:  CT head from the same day. FINDINGS: Brain: No acute infarction, hemorrhage, hydrocephalus, extra-axial collection or mass lesion. Remote right cerebellar infarct with encephalomalacia and surrounding gliosis. Vascular: Major arterial flow voids are maintained at the skull base. Skull and upper cervical spine: Normal marrow signal. Sinuses/Orbits: Moderate paranasal sinus mucosal thickening, greatest in the left maxillary sinus. Other: No sizable mastoid effusions. IMPRESSION: 1. No evidence of acute intracranial abnormality. 2. Remote right cerebellar infarct. 3. Paranasal sinus mucosal thickening. Electronically Signed   By: M.D.   On: 08/09/2021 16:53   CT Head Wo Contrast  Result Date: 08/09/2021 CLINICAL DATA:  Altered mental status, dizziness EXAM: CT HEAD WITHOUT CONTRAST TECHNIQUE: Contiguous axial images were obtained from the base of the skull through the vertex without intravenous contrast. RADIATION DOSE REDUCTION: This exam was performed according to the departmental dose-optimization program which includes automated exposure control, adjustment of the mA and/or kV according to  patient size and/or use of iterative reconstruction technique. COMPARISON:  07/04/2021 FINDINGS: Brain: No acute intracranial findings are seen. There are no signs of bleeding within the cranium. There is old infarct in the posteromedial right cerebellum with no interval change. Vascular: Unremarkable. Skull: Unremarkable. Sinuses/Orbits: There is mucosal thickening in ethmoid, sphenoid and maxillary sinuses more so in the right maxillary sinus with interval worsening. Other: None. IMPRESSION: No acute intracranial findings  are seen in noncontrast CT brain. Chronic sinusitis, more severe in left maxillary sinus with interval worsening. Electronically Signed   By: Ernie Avena M.D.   On: 08/09/2021 12:52     Discharge Exam: Vitals:   08/10/21 0522 08/10/21 0900  BP: (!) 150/82 (!) 145/80  Pulse: 74 74  Resp: 20   Temp: 97.7 F (36.5 C)   SpO2: 97%    Vitals:   08/09/21 2118 08/10/21 0121 08/10/21 0522 08/10/21 0900  BP: (!) 144/74 139/86 (!) 150/82 (!) 145/80  Pulse: 81 68 74 74  Resp: 19 20 20    Temp: 98.1 F (36.7 C) 97.7 F (36.5 C) 97.7 F (36.5 C)   TempSrc: Oral Oral Oral   SpO2: 98% 98% 97%   Weight:      Height:        General: Pt is alert, awake, not in acute distress Cardiovascular: RRR, S1/S2 +, no rubs, no gallops Respiratory: CTA bilaterally, no wheezing, no rhonchi Abdominal: Soft, NT, ND, bowel sounds + Extremities: no edema, no cyanosis    The results of significant diagnostics from this hospitalization (including imaging, microbiology, ancillary and laboratory) are listed below for reference.     Microbiology: No results found for this or any previous visit (from the past 240 hour(s)).   Labs: BNP (last 3 results) No results for input(s): "BNP" in the last 8760 hours. Basic Metabolic Panel: Recent Labs  Lab 08/09/21 1037 08/10/21 0541  NA 137 136  K 5.0 4.2  CL 103 103  CO2 24 28  GLUCOSE 195* 171*  BUN 9 13  CREATININE 0.53* 0.55*  CALCIUM 9.3 8.4*  MG  --  1.8   Liver Function Tests: Recent Labs  Lab 08/09/21 1037 08/10/21 0541  AST 22 20  ALT 24 23  ALKPHOS 49 40  BILITOT 0.6 0.5  PROT 7.2 6.5  ALBUMIN 3.9 3.5   No results for input(s): "LIPASE", "AMYLASE" in the last 168 hours. Recent Labs  Lab 08/09/21 1037  AMMONIA 40*   CBC: Recent Labs  Lab 08/09/21 1037 08/10/21 0541  WBC 12.8* 9.5  HGB 13.5 12.2*  HCT 41.5 37.9*  MCV 83.8 84.4  PLT 433* 371   Cardiac Enzymes: No results for input(s): "CKTOTAL", "CKMB", "CKMBINDEX",  "TROPONINI" in the last 168 hours. BNP: Invalid input(s): "POCBNP" CBG: Recent Labs  Lab 08/09/21 1622 08/09/21 2122 08/10/21 0738  GLUCAP 255* 290* 196*   D-Dimer No results for input(s): "DDIMER" in the last 72 hours. Hgb A1c No results for input(s): "HGBA1C" in the last 72 hours. Lipid Profile No results for input(s): "CHOL", "HDL", "LDLCALC", "TRIG", "CHOLHDL", "LDLDIRECT" in the last 72 hours. Thyroid function studies Recent Labs    08/09/21 1500  TSH 1.376   Anemia work up Recent Labs    08/09/21 1500  VITAMINB12 213   Urinalysis    Component Value Date/Time   COLORURINE STRAW (A) 08/09/2021 1018   APPEARANCEUR CLEAR 08/09/2021 1018   LABSPEC 1.025 08/09/2021 1018   PHURINE 5.0 08/09/2021 1018   GLUCOSEU >=500 (A) 08/09/2021  1018   HGBUR NEGATIVE 08/09/2021 1018   BILIRUBINUR NEGATIVE 08/09/2021 1018   KETONESUR 5 (A) 08/09/2021 1018   PROTEINUR NEGATIVE 08/09/2021 1018   NITRITE NEGATIVE 08/09/2021 1018   LEUKOCYTESUR NEGATIVE 08/09/2021 1018   Sepsis Labs Recent Labs  Lab 08/09/21 1037 08/10/21 0541  WBC 12.8* 9.5   Microbiology No results found for this or any previous visit (from the past 240 hour(s)).   Time coordinating discharge: 35 minutes  SIGNED:   Erick Blinks, DO [facility] 08/10/2021, 9:39 AM  If 7PM-7AM, please contact night-coverage [email or web address]

## 2021-08-10 NOTE — Evaluation (Signed)
Physical Therapy Evaluation Patient Details Name: Robert Rich MRN: [id number] DOB: 1965-06-14 Today's Date: 08/10/2021  History of Present Illness  Robert Rich is a 56 y.o. male with medical history significant for CAD with prior MI, dyslipidemia, hypertension, COPD, intermittent alcohol abuse, obesity, type 2 diabetes, and prior CVA who presented to the ED with noted weakness and dizziness and fall this AM.  He overall states that he continues to feel "weird."  He denies any headaches, visual changes, or focal deficits such as weakness in his arms or legs in particular.  He did drink quite excessively yesterday and had a sixpack of beer which she normally does not do and he denies any use of illicit drugs.  He otherwise takes his routine home medications.  No incontinence or seizure activity described.  Patient denies any fevers or chills.  Patient denies any sinus symptoms.    Clinical Impression  Patient lying in bed on therapist arrival.  Agreeable to therapy session.  Reports significant headache pain today.  Supine to sit modified independent and demonstrates good sitting balance.  Sit to stand with min guard from therapist and ambulates in room with min guard/hand held assist from therapist without path deviation or loss of balance.  Nursing in room on therapy session end. Patient will benefit from continued therapy services during the duration of his hospital stay and at the next recommended venue of care to address deficits and promote return to optimal function.        Recommendations for follow up therapy are one component of a multi-disciplinary discharge planning process, led by the attending physician.  Recommendations may be updated based on patient status, additional functional criteria and insurance authorization.  Follow Up Recommendations Outpatient PT      Assistance Recommended at Discharge PRN  Patient can return home with the following  A little help with walking  and/or transfers;A little help with bathing/dressing/bathroom;Help with stairs or ramp for entrance    Equipment Recommendations None recommended by PT  Recommendations for Other Services       Functional Status Assessment Patient has had a recent decline in their functional status and demonstrates the ability to make significant improvements in function in a reasonable and predictable amount of time.     Precautions / Restrictions Precautions Precautions: Fall Restrictions Weight Bearing Restrictions: No      Mobility  Bed Mobility Overal bed mobility: Modified Independent               Patient Response: Cooperative  Transfers Overall transfer level: Modified independent                      Ambulation/Gait Ambulation/Gait assistance: Min guard Gait Distance (Feet): 20 Feet Assistive device: 1 person hand held assist Gait Pattern/deviations: Decreased step length - right, Decreased step length - left       General Gait Details: general decreased gait speed  Stairs            Wheelchair Mobility    Modified Rankin (Stroke Patients Only)       Balance Overall balance assessment: Needs assistance Sitting-balance support: Feet supported Sitting balance-Leahy Scale: Good     Standing balance support: During functional activity, Single extremity supported Standing balance-Leahy Scale: Good Standing balance comment: good standing balance with min guard from therapist  Pertinent Vitals/Pain Pain Assessment Pain Assessment: 0-10 Pain Score: 9  Pain Location: headache; nursing notified Pain Intervention(s): Monitored during session    Home Living Family/patient expects to be discharged to:: Private residence Living Arrangements: Spouse/significant other Available Help at Discharge: Family Type of Home: House Home Access: Level entry       Home Layout: One level Home Equipment: None       Prior Function Prior Level of Function : Independent/Modified Independent;Driving             Mobility Comments: no AD use ADLs Comments: does IADLs     Hand Dominance        Extremity/Trunk Assessment   Upper Extremity Assessment Upper Extremity Assessment: Generalized weakness    Lower Extremity Assessment Lower Extremity Assessment: Generalized weakness    Cervical / Trunk Assessment Cervical / Trunk Assessment: Normal (recent neck surgery 6 weeks ago per Dr. Shon Baton in [place] at [facility])  Communication   Communication: No difficulties  Cognition Arousal/Alertness: Awake/alert Behavior During Therapy: [facility] for tasks assessed/performed Overall Cognitive Status: Within Functional Limits for tasks assessed                                          General Comments      Exercises     Assessment/Plan    PT Assessment Patient needs continued PT services  PT Problem List Decreased strength;Decreased activity tolerance;Decreased balance;Decreased mobility;Pain       PT Treatment Interventions Balance training;Gait training;Functional mobility training;Therapeutic activities;Therapeutic exercise;Patient/family education    PT Goals (Current goals can be found in the Care Plan section)  Acute Rehab PT Goals Patient Stated Goal: return home PT Goal Formulation: With patient Time For Goal Achievement: 08/24/21    Frequency Min 2X/week     Co-evaluation               AM-PAC PT "6 Clicks" Mobility  Outcome Measure Help needed turning from your back to your side while in a flat bed without using bedrails?: None Help needed moving from lying on your back to sitting on the side of a flat bed without using bedrails?: None Help needed moving to and from a bed to a chair (including a wheelchair)?: A Little Help needed standing up from a chair using your arms (e.g., wheelchair or bedside chair)?: A Little Help needed to walk in hospital  room?: A Little Help needed climbing 3-5 steps with a railing? : A Little 6 Click Score: 20    End of Session   Activity Tolerance: Patient tolerated treatment well Patient left: in bed;with call bell/phone within reach;with nursing/sitter in room Nurse Communication: Mobility status PT Visit Diagnosis: Unsteadiness on feet (R26.81);Muscle weakness (generalized) (M62.81)    Time: 0017-4944 PT Time Calculation (min) (ACUTE ONLY): 16 min   Charges:   PT Evaluation $PT Eval Low Complexity: 1 Low PT Treatments $Therapeutic Activity: 8-22 mins        9:43 AM, 08/10/21 Laurella Tull Small Monesha Monreal MPT [facility] [place] [phone] Ph:[phone]

## 2021-08-10 NOTE — Progress Notes (Signed)
Discharge instructions read to patient He verbalized understanding of all information.  Discharged to home with family 

## 2021-08-14 ENCOUNTER — Encounter: Payer: Self-pay | Admitting: Physical Therapy

## 2021-08-14 ENCOUNTER — Ambulatory Visit: Payer: Medicaid Other | Admitting: Physical Therapy

## 2021-08-14 DIAGNOSIS — M542 Cervicalgia: Secondary | ICD-10-CM | POA: Diagnosis not present

## 2021-08-14 DIAGNOSIS — M6281 Muscle weakness (generalized): Secondary | ICD-10-CM

## 2021-08-14 DIAGNOSIS — R293 Abnormal posture: Secondary | ICD-10-CM

## 2021-08-14 NOTE — Therapy (Addendum)
OUTPATIENT PHYSICAL THERAPY TREATMENT NOTE   Patient Name: Robert Rich MRN: [id number] DOB:[birth year], 56 y.o., male Today's Date: 08/14/2021  PCP: Bridget Hartshorn, NP REFERRING PROVIDER: Melina Schools, MD   PT End of Session - 08/14/21 0905     Visit Number 5    Number of Visits 12    Date for PT Re-Evaluation 09/03/21    PT Start Time 0819    PT Stop Time 0839    PT Time Calculation (min) 20 min    Activity Tolerance Patient tolerated treatment well    Behavior During Therapy [facility] for tasks assessed/performed             Past Medical History:  Diagnosis Date   COPD (chronic obstructive pulmonary disease) ([place])    Coronary artery disease    DM (diabetes mellitus) ([place])    Dyspnea    Heart attack ([place])    HLD (hyperlipidemia)    HTN (hypertension)    Obesity    Stroke [facility])    Past Surgical History:  Procedure Laterality Date   ANTERIOR CERVICAL DECOMPRESSION/DISCECTOMY FUSION 4 LEVELS N/A 06/10/2021   Procedure: Anterior cervical discectomy and fusion Cervical four to seven;  Surgeon: Melina Schools, MD;  Location: [place];  Service: Orthopedics;  Laterality: N/A;   INTRAVASCULAR PRESSURE WIRE/FFR STUDY N/A 03/18/2019   Procedure: INTRAVASCULAR PRESSURE WIRE/FFR STUDY;  Surgeon: Adrian Prows, MD;  Location: [place] CV LAB;  Service: Cardiovascular;  Laterality: N/A;   LEFT HEART CATH AND CORONARY ANGIOGRAPHY N/A 03/18/2019   Procedure: LEFT HEART CATH AND CORONARY ANGIOGRAPHY;  Surgeon: Adrian Prows, MD;  Location: [place] CV [facility];  Service: Cardiovascular;  Laterality: N/A;   NO PAST SURGERIES     Patient Active Problem List   Diagnosis Date Noted   Acute encephalopathy 08/09/2021   Cervical disc herniation 06/10/2021   Exertional chest pain 08/09/2020   COPD (chronic obstructive pulmonary disease) ([place]) 12/20/2019   Type 2 diabetes mellitus without complication ([place]) 33/00/7622   COPD GOLD 0/  group B 08/24/2019   Multiple pulmonary nodules determined  by computed tomography of lung 08/24/2019   COPD exacerbation (HCC)    COPD with acute exacerbation ([place]) 08/04/2019   Coronary artery disease    HLD (hyperlipidemia)    Leukocytosis    Hyperglycemia    Steroid-induced hyperglycemia    Acute respiratory failure with hypoxia ([place])    Morbid obesity due to excess calories ([place])    Hypertensive crisis    Anxiety    Substance abuse ([place])    Cardiac arrest ([place]) 03/17/2019   2019 novel coronavirus disease (COVID-19) 12/07/2018   Tobacco abuse 12/06/2018    REFERRING DIAG: Encounter for other specified surgical aftercare (cervical fusion C4 to C7).  THERAPY DIAG:  Muscle weakness (generalized)  Cervicalgia  Abnormal posture  Rationale for Evaluation and Treatment Rehabilitation  PERTINENT HISTORY: HLD, HTN, "two small strokes", CAD, DM, MI, heart cath.   SUBJECTIVE: Patient returns to PT today after reportedly suffering a mini stroke on Friday. Patient states that he was ordered to not be outside if temps are over 85 deg and to limit use of LUE.  PAIN:  Are you having pain? Yes: NPRS scale: 8/10 Pain location: L shoulder Pain description: ache Aggravating factors:   Relieving factors:       TODAY'S TREATMENT: Modalities  Date: 08/14/2021 Unattended Estim: Cervical, IFC 80-150 Hz, 15 mins, Pain and Tone Hot Pack: Cervical, 15 mins, Pain and Tone    PT  Long Term Goals - 08/14/21 0817      PT LONG TERM GOAL #1   Title Independent with a HEP.    Baseline No knowledge of appropriate ther ex. post cervical fusion.    Time 6    Period Weeks    Status On-going     PT LONG TERM GOAL #2   Title Increase active bilateral cervical rotation to 60 degrees+ so patient can turn head more easily while driving.    Baseline LT 40 degrees and RT is 35 degrees.    Time 6    Period Weeks    Status On-going     PT LONG TERM GOAL #3   Title Eliminate UE symptoms.    Baseline Symptoms into  left shoulderand  to first and second  fingers of left hand.    Time 6    Period Weeks    Status Partially met     PT LONG TERM GOAL #4   Title Left UE strength to 5/5.    Baseline Strength deficits of 4- and 4+/5.    Time 6    Period Weeks    Status On-going             Plan - 08/14/21 0817    Clinical Impression Statement Patient presented in clinic with reports of high level L shoulder pain following a TIA last week. Patient opted for only stim and moist heat today as he was under MD orders to limit LUE use. Normal modalities response noted following removal of the modalities.   Personal Factors and Comorbidities Comorbidity 1;Comorbidity 2;Other    Comorbidities HLD, HTN, "two small strokes", CAD, DM, MI, heart cath.    Examination-Activity Limitations Other    Stability/Clinical Decision Making Stable/Uncomplicated    Rehab Potential Excellent    PT Frequency 2x / week    PT Duration 6 weeks    PT Treatment/Interventions ADLs/Self Care Home Management;Cryotherapy;Electrical Stimulation;Ultrasound;Traction;Moist Heat;Therapeutic activities;Therapeutic exercise;Manual techniques;Patient/family education;Passive range of motion;Dry needling    PT Next Visit Plan Please progress per cervical fusion protocol.  Modalities and STW/M as needed.    Consulted and Agree with Plan of Care Patient            Standley Brooking, PTA 08/14/21 9:05 AM

## 2021-08-16 ENCOUNTER — Encounter: Payer: Self-pay | Admitting: Physical Therapy

## 2021-08-16 ENCOUNTER — Ambulatory Visit: Payer: Medicaid Other | Admitting: Physical Therapy

## 2021-08-16 DIAGNOSIS — M542 Cervicalgia: Secondary | ICD-10-CM | POA: Diagnosis not present

## 2021-08-16 DIAGNOSIS — M6281 Muscle weakness (generalized): Secondary | ICD-10-CM

## 2021-08-16 DIAGNOSIS — R293 Abnormal posture: Secondary | ICD-10-CM

## 2021-08-16 LAB — VITAMIN B1

## 2021-08-16 NOTE — Therapy (Signed)
OUTPATIENT PHYSICAL THERAPY TREATMENT NOTE   Patient Name: Robert Rich MRN: [id number] DOB:05-Sep-1965, 56 y.o., male Today's Date: 08/16/2021  PCP: Bridget Hartshorn, NP REFERRING PROVIDER: Melina Schools, MD   PT End of Session - 08/16/21 0820     Visit Number 6    Number of Visits 12    Date for PT Re-Evaluation 09/03/21    PT Start Time 0816    PT Stop Time 0853    PT Time Calculation (min) 37 min    Activity Tolerance Patient tolerated treatment well    Behavior During Therapy [facility] for tasks assessed/performed             Past Medical History:  Diagnosis Date   COPD (chronic obstructive pulmonary disease) ([place])    Coronary artery disease    DM (diabetes mellitus) ([place])    Dyspnea    Heart attack ([place])    HLD (hyperlipidemia)    HTN (hypertension)    Obesity    Stroke [facility])    Past Surgical History:  Procedure Laterality Date   ANTERIOR CERVICAL DECOMPRESSION/DISCECTOMY FUSION 4 LEVELS N/A 06/10/2021   Procedure: Anterior cervical discectomy and fusion Cervical four to seven;  Surgeon: Melina Schools, MD;  Location: [place];  Service: Orthopedics;  Laterality: N/A;   INTRAVASCULAR PRESSURE WIRE/FFR STUDY N/A 03/18/2019   Procedure: INTRAVASCULAR PRESSURE WIRE/FFR STUDY;  Surgeon: Adrian Prows, MD;  Location: [place] [facility];  Service: Cardiovascular;  Laterality: N/A;   LEFT HEART CATH AND CORONARY ANGIOGRAPHY N/A 03/18/2019   Procedure: LEFT HEART CATH AND CORONARY ANGIOGRAPHY;  Surgeon: Adrian Prows, MD;  Location: [place] [facility];  Service: Cardiovascular;  Laterality: N/A;   NO PAST SURGERIES     Patient Active Problem List   Diagnosis Date Noted   Acute encephalopathy 08/09/2021   Cervical disc herniation 06/10/2021   Exertional chest pain 08/09/2020   COPD (chronic obstructive pulmonary disease) ([place]) 12/20/2019   Type 2 diabetes mellitus without complication ([place]) 93/23/5573   COPD GOLD 0/  group B 08/24/2019   Multiple pulmonary nodules determined  by computed tomography of lung 08/24/2019   COPD exacerbation (HCC)    COPD with acute exacerbation ([place]) 08/04/2019   Coronary artery disease    HLD (hyperlipidemia)    Leukocytosis    Hyperglycemia    Steroid-induced hyperglycemia    Acute respiratory failure with hypoxia ([place])    Morbid obesity due to excess calories ([place])    Hypertensive crisis    Anxiety    Substance abuse ([place])    Cardiac arrest ([place]) 03/17/2019   2019 novel coronavirus disease (COVID-19) 12/07/2018   Tobacco abuse 12/06/2018    REFERRING DIAG: Encounter for other specified surgical aftercare (cervical fusion C4 to C7).  THERAPY DIAG:  Muscle weakness (generalized)  Cervicalgia  Abnormal posture  Rationale for Evaluation and Treatment Rehabilitation  PERTINENT HISTORY: HLD, HTN, "two small strokes", CAD, DM, MI, heart cath.   SUBJECTIVE: Reports feeling better about PT today. States that his L shoulder is a constant ache. Can't sleep but relates that to neck. Reports that following surgery he was able to rotate his neck more but feels like his neck has tightened back up.  PAIN:  Are you having pain? Yes: NPRS scale: 8/10 Pain location: L shoulder Pain description: ache Aggravating factors:   Relieving factors:       TODAY'S TREATMENT:  EXERCISE LOG  Exercise Repetitions and Resistance Comments  UBE 90 RPM x 8 min   Corner stretch 3x20 sec   Wall pushups X20 reps   Shoulder extension Red x20 reps   Shoulder row  Red x20 reps   Horizontal abduction Red x20 reps   Shoulder ER Red x20 reps    Blank cell = exercise not performed today   Modalities  Date: 08/16/2021 Unattended Estim: Shoulder, 80-150 hz, 15 mins, Pain Hot Pack: Shoulder, 15 mins, Pain    PT Long Term Goals - 08/16/21 1194      PT LONG TERM GOAL #1   Title Independent with a HEP.    Baseline No knowledge of appropriate ther ex. post cervical fusion.    Time 6    Period Weeks     Status On-going     PT LONG TERM GOAL #2   Title Increase active bilateral cervical rotation to 60 degrees+ so patient can turn head more easily while driving.    Baseline LT 40 degrees and RT is 35 degrees.    Time 6    Period Weeks    Status On-going     PT LONG TERM GOAL #3   Title Eliminate UE symptoms.    Baseline Symptoms into  left shoulderand  to first and second fingers of left hand.    Time 6    Period Weeks    Status Partially met     PT LONG TERM GOAL #4   Title Left UE strength to 5/5.    Baseline Strength deficits of 4- and 4+/5.    Time 6    Period Weeks    Status On-going             Plan - 08/16/21 1740    Clinical Impression Statement Patient presented in clinic with reports of L shoulder aching which affects comfort level. Patient indicates difficulty sleeping as he is normally a side sleeper. Patient progressed through stretching and postural strengthening as patient indicated ROM limitations due to tightness. Therex ended due to muscle fatigue reported by patient. Normal modalities response noted following removal of the modalities.   Personal Factors and Comorbidities Comorbidity 1;Comorbidity 2;Other    Comorbidities HLD, HTN, "two small strokes", CAD, DM, MI, heart cath.    Examination-Activity Limitations Other    Stability/Clinical Decision Making Stable/Uncomplicated    Rehab Potential Excellent    PT Frequency 2x / week    PT Duration 6 weeks    PT Treatment/Interventions ADLs/Self Care Home Management;Cryotherapy;Electrical Stimulation;Ultrasound;Traction;Moist Heat;Therapeutic activities;Therapeutic exercise;Manual techniques;Patient/family education;Passive range of motion;Dry needling    PT Next Visit Plan Please progress per cervical fusion protocol.  Modalities and STW/M as needed.    Consulted and Agree with Plan of Care Patient            Standley Brooking, PTA 08/16/21 9:41 AM

## 2021-08-19 ENCOUNTER — Encounter: Payer: Medicaid Other | Admitting: Physical Therapy

## 2021-08-21 ENCOUNTER — Encounter: Payer: Self-pay | Admitting: Physical Therapy

## 2021-08-21 ENCOUNTER — Ambulatory Visit: Payer: Medicaid Other | Admitting: Physical Therapy

## 2021-08-21 DIAGNOSIS — M542 Cervicalgia: Secondary | ICD-10-CM | POA: Diagnosis not present

## 2021-08-21 DIAGNOSIS — R293 Abnormal posture: Secondary | ICD-10-CM

## 2021-08-21 DIAGNOSIS — M6281 Muscle weakness (generalized): Secondary | ICD-10-CM

## 2021-08-21 NOTE — Therapy (Signed)
OUTPATIENT PHYSICAL THERAPY TREATMENT NOTE   Patient Name: CULLEN LAHAIE MRN: [id number] DOB:1965/02/11, 56 y.o., male Today's Date: 08/21/2021  PCP: Bridget Hartshorn, NP REFERRING PROVIDER: Melina Schools, MD   PT End of Session - 08/21/21 0844     Visit Number 7    Number of Visits 12    Date for PT Re-Evaluation 09/03/21    PT Start Time 0815    PT Stop Time 0841    PT Time Calculation (min) 26 min    Activity Tolerance Patient tolerated treatment well    Behavior During Therapy [facility] for tasks assessed/performed             Past Medical History:  Diagnosis Date   COPD (chronic obstructive pulmonary disease) ([place])    Coronary artery disease    DM (diabetes mellitus) ([place])    Dyspnea    Heart attack ([place])    HLD (hyperlipidemia)    HTN (hypertension)    Obesity    Stroke [facility])    Past Surgical History:  Procedure Laterality Date   ANTERIOR CERVICAL DECOMPRESSION/DISCECTOMY FUSION 4 LEVELS N/A 06/10/2021   Procedure: Anterior cervical discectomy and fusion Cervical four to seven;  Surgeon: Melina Schools, MD;  Location: [place];  Service: Orthopedics;  Laterality: N/A;   INTRAVASCULAR PRESSURE WIRE/FFR STUDY N/A 03/18/2019   Procedure: INTRAVASCULAR PRESSURE WIRE/FFR STUDY;  Surgeon: Adrian Prows, MD;  Location: [place] [facility];  Service: Cardiovascular;  Laterality: N/A;   LEFT HEART CATH AND CORONARY ANGIOGRAPHY N/A 03/18/2019   Procedure: LEFT HEART CATH AND CORONARY ANGIOGRAPHY;  Surgeon: Adrian Prows, MD;  Location: [place] [facility];  Service: Cardiovascular;  Laterality: N/A;   NO PAST SURGERIES     Patient Active Problem List   Diagnosis Date Noted   Acute encephalopathy 08/09/2021   Cervical disc herniation 06/10/2021   Exertional chest pain 08/09/2020   COPD (chronic obstructive pulmonary disease) ([place]) 12/20/2019   Type 2 diabetes mellitus without complication ([place]) 18/56/3149   COPD GOLD 0/  group B 08/24/2019   Multiple pulmonary nodules determined  by computed tomography of lung 08/24/2019   COPD exacerbation (HCC)    COPD with acute exacerbation ([place]) 08/04/2019   Coronary artery disease    HLD (hyperlipidemia)    Leukocytosis    Hyperglycemia    Steroid-induced hyperglycemia    Acute respiratory failure with hypoxia ([place])    Morbid obesity due to excess calories ([place])    Hypertensive crisis    Anxiety    Substance abuse ([place])    Cardiac arrest ([place]) 03/17/2019   2019 novel coronavirus disease (COVID-19) 12/07/2018   Tobacco abuse 12/06/2018    REFERRING DIAG: Encounter for other specified surgical aftercare (cervical fusion C4 to C7).  THERAPY DIAG:  Muscle weakness (generalized)  Cervicalgia  Abnormal posture  Rationale for Evaluation and Treatment Rehabilitation  PERTINENT HISTORY: HLD, HTN, "two small strokes", CAD, DM, MI, heart cath.   SUBJECTIVE: Patient states he feels great today with low pain.  He is requesting heat and e'stim today.  PAIN:  Are you having pain? Yes: NPRS scale: 3/10 Pain location: L shoulder Pain description: ache Aggravating factors:   Relieving factors:       TODAY'S TREATMENT:    Modalities  Date: 08/21/2021 HMP and low-level IFC at 80-150 Hz, 40% scan x 20 minutes to patient's left cervical region.   PT Long Term Goals - 08/16/21 0923      PT LONG TERM GOAL #1  Title Independent with a HEP.    Baseline No knowledge of appropriate ther ex. post cervical fusion.    Time 6    Period Weeks    Status On-going     PT LONG TERM GOAL #2   Title Increase active bilateral cervical rotation to 60 degrees+ so patient can turn head more easily while driving.    Baseline LT 40 degrees and RT is 35 degrees.    Time 6    Period Weeks    Status On-going     PT LONG TERM GOAL #3   Title Eliminate UE symptoms.    Baseline Symptoms into  left shoulderand  to first and second fingers of left hand.    Time 6    Period Weeks    Status Partially met     PT LONG TERM GOAL #4    Title Left UE strength to 5/5.    Baseline Strength deficits of 4- and 4+/5.    Time 6    Period Weeks    Status On-going             Plan - 08/21/21 0092    Clinical Impression Statement Patient presented to the clinic with a lowered pain-level.  He is very pleased with his surgical outcome thus far.  Normal modalities response noted following removal of the modalities.   Personal Factors and Comorbidities Comorbidity 1;Comorbidity 2;Other    Comorbidities HLD, HTN, "two small strokes", CAD, DM, MI, heart cath.    Examination-Activity Limitations Other    Stability/Clinical Decision Making Stable/Uncomplicated    Rehab Potential Excellent    PT Frequency 2x / week    PT Duration 6 weeks    PT Treatment/Interventions ADLs/Self Care Home Management;Cryotherapy;Electrical Stimulation;Ultrasound;Traction;Moist Heat;Therapeutic activities;Therapeutic exercise;Manual techniques;Patient/family education;Passive range of motion;Dry needling    PT Next Visit Plan Please progress per cervical fusion protocol.  Modalities and STW/M as needed.    Consulted and Agree with Plan of Care Patient            Mali Tamyka Bezio MPT  08/21/21 8:46 AM

## 2021-08-25 ENCOUNTER — Other Ambulatory Visit: Payer: Self-pay | Admitting: Internal Medicine

## 2021-08-28 ENCOUNTER — Ambulatory Visit: Payer: Medicaid Other | Attending: Orthopedic Surgery

## 2021-08-28 DIAGNOSIS — M6281 Muscle weakness (generalized): Secondary | ICD-10-CM | POA: Diagnosis present

## 2021-08-28 DIAGNOSIS — R293 Abnormal posture: Secondary | ICD-10-CM | POA: Diagnosis present

## 2021-08-28 DIAGNOSIS — M542 Cervicalgia: Secondary | ICD-10-CM | POA: Diagnosis present

## 2021-08-28 NOTE — Therapy (Signed)
OUTPATIENT PHYSICAL THERAPY TREATMENT NOTE   Patient Name: SAYRE WITHERINGTON MRN: [id number] DOB:[birth year], 56 y.o., male Today's Date: 08/28/2021  PCP: Bridget Hartshorn, NP REFERRING PROVIDER: Melina Schools, MD   PT End of Session - 08/28/21 9323     Visit Number 8    Number of Visits 12    Date for PT Re-Evaluation 09/03/21    PT Start Time 0815    PT Stop Time 0839    PT Time Calculation (min) 24 min    Activity Tolerance Patient tolerated treatment well    Behavior During Therapy [facility] for tasks assessed/performed             Past Medical History:  Diagnosis Date   COPD (chronic obstructive pulmonary disease) ([place])    Coronary artery disease    DM (diabetes mellitus) ([place])    Dyspnea    Heart attack ([place])    HLD (hyperlipidemia)    HTN (hypertension)    Obesity    Stroke [facility])    Past Surgical History:  Procedure Laterality Date   ANTERIOR CERVICAL DECOMPRESSION/DISCECTOMY FUSION 4 LEVELS N/A 06/10/2021   Procedure: Anterior cervical discectomy and fusion Cervical four to seven;  Surgeon: Melina Schools, MD;  Location: [place];  Service: Orthopedics;  Laterality: N/A;   INTRAVASCULAR PRESSURE WIRE/FFR STUDY N/A 03/18/2019   Procedure: INTRAVASCULAR PRESSURE WIRE/FFR STUDY;  Surgeon: Adrian Prows, MD;  Location: [place] [facility];  Service: Cardiovascular;  Laterality: N/A;   LEFT HEART CATH AND CORONARY ANGIOGRAPHY N/A 03/18/2019   Procedure: LEFT HEART CATH AND CORONARY ANGIOGRAPHY;  Surgeon: Adrian Prows, MD;  Location: [place] [facility];  Service: Cardiovascular;  Laterality: N/A;   NO PAST SURGERIES     Patient Active Problem List   Diagnosis Date Noted   Acute encephalopathy 08/09/2021   Cervical disc herniation 06/10/2021   Exertional chest pain 08/09/2020   COPD (chronic obstructive pulmonary disease) ([place]) 12/20/2019   Type 2 diabetes mellitus without complication ([place]) 55/73/2202   COPD GOLD 0/  group B 08/24/2019   Multiple pulmonary nodules determined  by computed tomography of lung 08/24/2019   COPD exacerbation (HCC)    COPD with acute exacerbation ([place]) 08/04/2019   Coronary artery disease    HLD (hyperlipidemia)    Leukocytosis    Hyperglycemia    Steroid-induced hyperglycemia    Acute respiratory failure with hypoxia ([place])    Morbid obesity due to excess calories ([place])    Hypertensive crisis    Anxiety    Substance abuse ([place])    Cardiac arrest ([place]) 03/17/2019   2019 novel coronavirus disease (COVID-19) 12/07/2018   Tobacco abuse 12/06/2018    REFERRING DIAG: Encounter for other specified surgical aftercare (cervical fusion C4 to C7).  THERAPY DIAG:  Muscle weakness (generalized)  Cervicalgia  Abnormal posture  Rationale for Evaluation and Treatment Rehabilitation  PERTINENT HISTORY: HLD, HTN, "two small strokes", CAPD, DM, MI, heart cath.   SUBJECTIVE: Pt denies any pain today, but endorses posterior shoulder tightness.  Pt requesting estim and MH only today.  Pt reports he will be ready for discharge on Friday.   PAIN:  Are you having pain? No : NPRS scale: 0/10 Pain location: L shoulder Pain description: ache Aggravating factors:   Relieving factors:       TODAY'S TREATMENT:  Modalities  Date: 08/21/2021 HMP and low-level IFC at 80-150 Hz, 40% scan x 15 minutes to patient's left cervical region.   PT Long Term Goals - 08/16/21 5427  PT LONG TERM GOAL #1   Title Independent with a HEP.    Baseline No knowledge of appropriate ther ex. post cervical fusion.    Time 6    Period Weeks    Status On-going     PT LONG TERM GOAL #2   Title Increase active bilateral cervical rotation to 60 degrees+ so patient can turn head more easily while driving.    Baseline LT 40 degrees and RT is 35 degrees.    Time 6    Period Weeks    Status On-going     PT LONG TERM GOAL #3   Title Eliminate UE symptoms.    Baseline Symptoms into  left shoulderand  to first and second fingers of left hand.    Time 6     Period Weeks    Status Partially met     PT LONG TERM GOAL #4   Title Left UE strength to 5/5.    Baseline Strength deficits of 4- and 4+/5.    Time 6    Period Weeks    Status On-going             Plan - 08/21/21 9030    Clinical Impression Statement Pt arrives for today's treatment session denying any pain, but does report posterior shoulder tightness.  Pt reports he feels ready to discharge at next treatment session.  Normal responses to estim and MH noted upon removal.     Personal Factors and Comorbidities Comorbidity 1;Comorbidity 2;Other    Comorbidities HLD, HTN, "two small strokes", CAD, DM, MI, heart cath.    Examination-Activity Limitations Other    Stability/Clinical Decision Making Stable/Uncomplicated    Rehab Potential Excellent    PT Frequency 2x / week    PT Duration 6 weeks    PT Treatment/Interventions ADLs/Self Care Home Management;Cryotherapy;Electrical Stimulation;Ultrasound;Traction;Moist Heat;Therapeutic activities;Therapeutic exercise;Manual techniques;Patient/family education;Passive range of motion;Dry needling    PT Next Visit Plan Please progress per cervical fusion protocol.  Modalities and STW/M as needed.    Consulted and Agree with Plan of Care Patient            Eda Paschal, PTA  08/28/21 8:45 AM

## 2021-08-30 ENCOUNTER — Ambulatory Visit: Payer: Medicaid Other | Admitting: Physical Therapy

## 2021-08-30 ENCOUNTER — Encounter: Payer: Self-pay | Admitting: Physical Therapy

## 2021-08-30 DIAGNOSIS — R293 Abnormal posture: Secondary | ICD-10-CM

## 2021-08-30 DIAGNOSIS — M6281 Muscle weakness (generalized): Secondary | ICD-10-CM

## 2021-08-30 DIAGNOSIS — M542 Cervicalgia: Secondary | ICD-10-CM

## 2021-08-30 NOTE — Therapy (Addendum)
OUTPATIENT PHYSICAL THERAPY TREATMENT NOTE   Patient Name: Robert Rich MRN: [id number] DOB:16-Jul-1965, 56 y.o., male Today's Date: 08/30/2021  PCP: Bridget Hartshorn, NP REFERRING PROVIDER: Melina Schools, MD   PT End of Session - 08/30/21 [id number]     Visit Number 9    Number of Visits 12    Date for PT Re-Evaluation 09/03/21    PT Start Time 0818    PT Stop Time 0848    PT Time Calculation (min) 30 min    Activity Tolerance Patient tolerated treatment well    Behavior During Therapy [facility] for tasks assessed/performed             Past Medical History:  Diagnosis Date   COPD (chronic obstructive pulmonary disease) ([place])    Coronary artery disease    DM (diabetes mellitus) ([place])    Dyspnea    Heart attack ([place])    HLD (hyperlipidemia)    HTN (hypertension)    Obesity    Stroke [facility])    Past Surgical History:  Procedure Laterality Date   ANTERIOR CERVICAL DECOMPRESSION/DISCECTOMY FUSION 4 LEVELS N/A 06/10/2021   Procedure: Anterior cervical discectomy and fusion Cervical four to seven;  Surgeon: Melina Schools, MD;  Location: [place];  Service: Orthopedics;  Laterality: N/A;   INTRAVASCULAR PRESSURE WIRE/FFR STUDY N/A 03/18/2019   Procedure: INTRAVASCULAR PRESSURE WIRE/FFR STUDY;  Surgeon: Adrian Prows, MD;  Location: [place] CV [facility];  Service: Cardiovascular;  Laterality: N/A;   LEFT HEART CATH AND CORONARY ANGIOGRAPHY N/A 03/18/2019   Procedure: LEFT HEART CATH AND CORONARY ANGIOGRAPHY;  Surgeon: Adrian Prows, MD;  Location: [place] CV [facility];  Service: Cardiovascular;  Laterality: N/A;   NO PAST SURGERIES     Patient Active Problem List   Diagnosis Date Noted   Acute encephalopathy 08/09/2021   Cervical disc herniation 06/10/2021   Exertional chest pain 08/09/2020   COPD (chronic obstructive pulmonary disease) ([place]) 12/20/2019   Type 2 diabetes mellitus without complication ([place]) 11/91/4782   COPD GOLD 0/  group B 08/24/2019   Multiple pulmonary nodules determined  by computed tomography of lung 08/24/2019   COPD exacerbation (HCC)    COPD with acute exacerbation ([place]) 08/04/2019   Coronary artery disease    HLD (hyperlipidemia)    Leukocytosis    Hyperglycemia    Steroid-induced hyperglycemia    Acute respiratory failure with hypoxia ([place])    Morbid obesity due to excess calories ([place])    Hypertensive crisis    Anxiety    Substance abuse ([place])    Cardiac arrest ([place]) 03/17/2019   2019 novel coronavirus disease (COVID-19) 12/07/2018   Tobacco abuse 12/06/2018    REFERRING DIAG: Encounter for other specified surgical aftercare (cervical fusion C4 to C7).  THERAPY DIAG:  Muscle weakness (generalized)  Cervicalgia  Abnormal posture  Rationale for Evaluation and Treatment Rehabilitation  PERTINENT HISTORY: HLD, HTN, "two small strokes", CAPD, DM, MI, heart cath.   SUBJECTIVE: Pt requesting estim and MH only today.  Pt reports he is ready for discharge.  PAIN:  Are you having pain? No : NPRS scale: 1/10 Pain location: L shoulder Pain description: ache Aggravating factors:   Relieving factors:       TODAY'S TREATMENT:  Modalities  Date: 08/21/2021 HMP and low-level IFC at 80-150 Hz, 40% scan x 20 minutes to patient's left cervical region.   PT Long Term Goals - 08/16/21 9562      PT LONG TERM GOAL #1   Title Independent with  a HEP.    Baseline No knowledge of appropriate ther ex. post cervical fusion.    Time 6    Period Weeks    Status Met     PT LONG TERM GOAL #2   Title Increase active bilateral cervical rotation to 60 degrees+ so patient can turn head more easily while driving.    Baseline 60/60   Time 6    Period Weeks    Status Met     PT LONG TERM GOAL #3   Title Eliminate UE symptoms.    Baseline Symptoms into  left shoulderand  to first and second fingers of left hand.    Time 6    Period Weeks    Status Partially met     PT LONG TERM GOAL #4   Title Left UE strength to 5/5.    Baseline Strength  deficits of 4- and 4+/5.    Time 6    Period Weeks    Status Partially met.             Plan - 08/21/21 0923    Clinical Impression Statement Patient did very well and he is very pleased with his progress.     Personal Factors and Comorbidities Comorbidity 1;Comorbidity 2;Other    Comorbidities HLD, HTN, "two small strokes", CAD, DM, MI, heart cath.    Examination-Activity Limitations Other    Stability/Clinical Decision Making Stable/Uncomplicated    Rehab Potential Excellent    PT Frequency 2x / week    PT Duration 6 weeks    PT Treatment/Interventions ADLs/Self Care Home Management;Cryotherapy;Electrical Stimulation;Ultrasound;Traction;Moist Heat;Therapeutic activities;Therapeutic exercise;Manual techniques;Patient/family education;Passive range of motion;Dry needling    PT Next Visit Plan Please progress per cervical fusion protocol.  Modalities and STW/M as needed.    Consulted and Agree with Plan of Care Patient           PHYSICAL THERAPY DISCHARGE SUMMARY  Visits from Start of Care: 9  Current functional level related to goals / functional outcomes: See above.   Remaining deficits: See goal section.   Education / Equipment: HEP.   Patient agrees to discharge. Patient goals were partially met. Patient is being discharged due to being pleased with the current functional level.    Mali Tariah Transue MPT

## 2021-11-11 ENCOUNTER — Ambulatory Visit: Payer: Medicaid Other | Admitting: Student

## 2021-11-11 ENCOUNTER — Ambulatory Visit: Payer: Medicaid Other

## 2021-11-11 VITALS — BP 137/76 | Temp 98.1°F | Resp 16 | Ht 69.0 in | Wt 233.4 lb

## 2021-11-11 DIAGNOSIS — E119 Type 2 diabetes mellitus without complications: Secondary | ICD-10-CM

## 2021-11-11 DIAGNOSIS — E782 Mixed hyperlipidemia: Secondary | ICD-10-CM

## 2021-11-11 DIAGNOSIS — I2510 Atherosclerotic heart disease of native coronary artery without angina pectoris: Secondary | ICD-10-CM

## 2021-11-11 DIAGNOSIS — I10 Essential (primary) hypertension: Secondary | ICD-10-CM

## 2021-11-11 NOTE — Progress Notes (Addendum)
Primary Physician/Referring:  Rebecka ApleyHemberg, Katherine V, NP  Patient ID: Robert Rich, male    DOB: [birth year], 56 y.o.   MRN: [id number]  Chief Complaint  Patient presents with   Coronary Artery Disease   Hypertension   Follow-up    6 months   HPI:    Robert Rich  is a 56 y.o. Caucasian male with hypertension, hyperlipidemia, uncontrolled DM, obesity, CAD with coronary angiography revealing ostial LAD 50% stenosis in 02/2019, severe COPD, vertebral artery dissection (2016).  Patient was admitted 02/2019 following cardiac arrest which was felt to be related to respiratory failure with underlying severe COPD.  Patient notably quit smoking and drinking alcohol and 02/2019.  Patient presents for 6 month follow up.  He remains asymptomatic from a cardiovascular standpoint, but continues to deal with significant neck and left arm pain. He has had difficulty with weight loss, he does admit that he is not exercising or as active as he use to be. Denies chest pain, orthopnea, leg edema, syncope, near syncope.  Past Medical History:  Diagnosis Date   COPD (chronic obstructive pulmonary disease) (HCC)    Coronary artery disease    DM (diabetes mellitus) (HCC)    Dyspnea    Heart attack (HCC)    HLD (hyperlipidemia)    HTN (hypertension)    Obesity    Stroke [facility](HCC)    Past Surgical History:  Procedure Laterality Date   ANTERIOR CERVICAL DECOMPRESSION/DISCECTOMY FUSION 4 LEVELS N/A 06/10/2021   Procedure: Anterior cervical discectomy and fusion Cervical four to seven;  Surgeon: Venita LickBrooks, Dahari, MD;  [facility];  Service: Orthopedics;  Laterality: N/A;   INTRAVASCULAR PRESSURE WIRE/FFR STUDY N/A 03/18/2019   Procedure: INTRAVASCULAR PRESSURE WIRE/FFR STUDY;  Surgeon: Yates DecampGanji, Jay, MD;  Location: [facility] INVASIVE CV LAB;  Service: Cardiovascular;  Laterality: N/A;   LEFT HEART CATH AND CORONARY ANGIOGRAPHY N/A 03/18/2019   Procedure: LEFT HEART CATH AND CORONARY ANGIOGRAPHY;  Surgeon: Yates DecampGanji, Jay, MD;   Location: [facility] INVASIVE CV LAB;  Service: Cardiovascular;  Laterality: N/A;   NO PAST SURGERIES     Family History  Problem Relation Age of Onset   Heart disease Mother    Heart failure Mother    Atrial fibrillation Mother    Kidney disease Father     Social History   Tobacco Use   Smoking status: Former    Packs/day: 0.10    Years: 20.00    Total pack years: 2.00    Types: Cigarettes    Quit date: 03/17/2019    Years since quitting: 2.6   Smokeless tobacco: Never   Tobacco comments:    Quit in feb of 2021  Substance Use Topics   Alcohol use: Not Currently    Alcohol/week: 12.0 standard drinks of alcohol    Types: 12 Cans of beer per week    Comment: Quit in Feb 2021   ROS  Review of Systems  Constitutional: Negative for malaise/fatigue and weight gain.  Cardiovascular:  Negative for chest pain, claudication, dyspnea on exertion, leg swelling, near-syncope, orthopnea, palpitations, paroxysmal nocturnal dyspnea and syncope.  Respiratory:  Positive for cough (chronic).   Musculoskeletal:  Positive for neck pain.  Neurological:  Negative for dizziness.   Objective  Blood pressure 137/76, temperature 98.1 F (36.7 C), temperature source Temporal, resp. rate 16, height 5\' 9"  (1.753 m), weight 233 lb 6.4 oz (105.9 kg).     11/11/2021   10:14 AM 08/10/2021    9:00 AM 08/10/2021  5:22 AM  Vitals with BMI  Height 5\' 9"     Weight 233 lbs 6 oz    BMI 34.45    Systolic 137 145  Diastolic 76 80 82  Pulse  74 74    Physical Exam Neck:     Vascular: No JVD.  Cardiovascular:     Rate and Rhythm: Normal rate and regular rhythm.     Pulses: Normal pulses.     Heart sounds: Normal heart sounds.  Pulmonary:     Effort: Pulmonary effort is normal.  Musculoskeletal:     Right lower leg: No edema.     Left lower leg: No edema.  Neurological:     Mental Status: He is alert.    Laboratory examination:   Recent Labs    05/27/21 0951 08/09/21 1037 08/10/21 0541  NA  138 137 136  K 4.1 5.0 4.2  CL 102 103 103  CO2 26 24 28   GLUCOSE 190* 195* 171*  BUN 11 9 13   CREATININE 0.75 0.53* 0.55*  CALCIUM 9.5 9.3 8.4*  GFRNONAA >60 >60 >60    CrCl cannot be calculated (Patient's most recent lab result is older than the maximum 21 days allowed.).     Latest Ref Rng & Units 08/10/2021    5:41 AM 08/09/2021   10:37 AM 05/27/2021    9:51 AM  CMP  Glucose 70 - 99 mg/dL 08/12/2021  08/11/2021  07/27/2021   BUN 6 - 20 mg/dL 13  9  11    Creatinine 0.61 - 1.24 mg/dL 201  007  121   Sodium 135 - 145 mmol/L 136  137  138   Potassium 3.5 - 5.1 mmol/L 4.2  5.0  4.1   Chloride 98 - 111 mmol/L 103  103  102   CO2 22 - 32 mmol/L 28  24  26    Calcium 8.9 - 10.3 mg/dL 8.4  9.3  9.5   Total Protein 6.5 - 8.1 g/dL 6.5  7.2    Total Bilirubin 0.3 - 1.2 mg/dL 0.5  0.6    Alkaline Phos 38 - 126 U/L 40  49    AST 15 - 41 U/L 20  22    ALT 0 - 44 U/L 23  24        Latest Ref Rng & Units 08/10/2021    5:41 AM 08/09/2021   10:37 AM 05/27/2021    9:51 AM  CBC  WBC 4.0 - 10.5 K/uL 9.5  12.8  11.4   Hemoglobin 13.0 - 17.0 g/dL 2.54   08/12/2021   Hematocrit 39.0 - 52.0 % 37.9  41.5  44.2   Platelets 150 - 400 K/uL 371  433  438    Lipid Panel     Component Value Date/Time   CHOL 154 12/21/2019 0441   TRIG 83 12/21/2019 0441   HDL 54 12/21/2019 0441   CHOLHDL 2.9 12/21/2019 0441   VLDL 17 12/21/2019 0441   LDLCALC 83 12/21/2019 0441   HEMOGLOBIN A1C Lab Results  Component Value Date   HGBA1C 9.4 (H) 05/27/2021   MPG 223.08 05/27/2021   TSH Recent Labs    08/09/21 1500  TSH 1.376    Allergies   Allergies  Allergen Reactions   Asa [Aspirin] Hives   Zolmitriptan Nausea And Vomiting and Other (See Comments)    Severe headaches    Medications Prior to Visit:   Outpatient Medications Prior to Visit  Medication Sig Dispense Refill  albuterol (VENTOLIN HFA) 108 (90 Base) MCG/ACT inhaler Inhale 1-2 puffs into the lungs every 6 (six) hours as needed for wheezing or shortness  of breath.     ANORO ELLIPTA 62.5-25 MCG/ACT AEPB INHALE 1 PUFF INTO LUNGS ONCE DAILY 180 each 0   clopidogrel (PLAVIX) 75 MG tablet Take 75 mg by mouth daily.     FARXIGA 10 MG TABS tablet Take 10 mg by mouth daily.     folic acid (FOLVITE) 1 MG tablet Take 1 tablet (1 mg total) by mouth daily. 30 tablet 2   insulin NPH-regular Human (NOVOLIN 70/30) (70-30) 100 UNIT/ML injection Inject 50 Units into the skin 2 (two) times daily with a meal. 10 mL 1   ipratropium-albuterol (DUONEB) 0.5-2.5 (3) MG/3ML SOLN Inhale 3 mLs into the lungs every 6 (six) hours as needed (shortness of breath).     losartan (COZAAR) 50 MG tablet Take 50 mg by mouth daily.     metFORMIN (GLUCOPHAGE) 1000 MG tablet Take 1 tablet (1,000 mg total) by mouth 2 (two) times daily with a meal. 60 tablet 2   metoprolol succinate (TOPROL-XL) 25 MG 24 hr tablet Take 1 tablet (25 mg total) by mouth daily. (Patient taking differently: Take 12.5 mg by mouth daily.) 30 tablet 2   montelukast (SINGULAIR) 10 MG tablet Take 1 tablet (10 mg total) by mouth at bedtime. 30 tablet 1   pregabalin (LYRICA) 75 MG capsule Take 75 mg by mouth 2 (two) times daily.     rosuvastatin (CRESTOR) 20 MG tablet Take 20 mg by mouth daily.     thiamine 100 MG tablet Take 2.5 tablets (250 mg total) by mouth daily. (Patient taking differently: Take 100 mg by mouth daily.) 30 tablet 2   ondansetron (ZOFRAN) 4 MG tablet Take 1 tablet (4 mg total) by mouth every 8 (eight) hours as needed for nausea or vomiting. (Patient not taking: Reported on 08/09/2021) 20 tablet 0   oxyCODONE-acetaminophen (PERCOCET/ROXICET) 5-325 MG tablet Take 1 tablet by mouth 3 (three) times daily as needed for pain.     No facility-administered medications prior to visit.   Final Medications at End of Visit    Current Meds  Medication Sig   albuterol (VENTOLIN HFA) 108 (90 Base) MCG/ACT inhaler Inhale 1-2 puffs into the lungs every 6 (six) hours as needed for wheezing or shortness of  breath.   ANORO ELLIPTA 62.5-25 MCG/ACT AEPB INHALE 1 PUFF INTO LUNGS ONCE DAILY   clopidogrel (PLAVIX) 75 MG tablet Take 75 mg by mouth daily.   FARXIGA 10 MG TABS tablet Take 10 mg by mouth daily.   folic acid (FOLVITE) 1 MG tablet Take 1 tablet (1 mg total) by mouth daily.   insulin NPH-regular Human (NOVOLIN 70/30) (70-30) 100 UNIT/ML injection Inject 50 Units into the skin 2 (two) times daily with a meal.   ipratropium-albuterol (DUONEB) 0.5-2.5 (3) MG/3ML SOLN Inhale 3 mLs into the lungs every 6 (six) hours as needed (shortness of breath).   losartan (COZAAR) 50 MG tablet Take 50 mg by mouth daily.   metFORMIN (GLUCOPHAGE) 1000 MG tablet Take 1 tablet (1,000 mg total) by mouth 2 (two) times daily with a meal.   metoprolol succinate (TOPROL-XL) 25 MG 24 hr tablet Take 1 tablet (25 mg total) by mouth daily. (Patient taking differently: Take 12.5 mg by mouth daily.)   montelukast (SINGULAIR) 10 MG tablet Take 1 tablet (10 mg total) by mouth at bedtime.   pregabalin (LYRICA) 75 MG capsule Take  75 mg by mouth 2 (two) times daily.   rosuvastatin (CRESTOR) 20 MG tablet Take 20 mg by mouth daily.   thiamine 100 MG tablet Take 2.5 tablets (250 mg total) by mouth daily. (Patient taking differently: Take 100 mg by mouth daily.)   Radiology:   No results found.  Cardiac Studies:  PCV MYOCARDIAL PERFUSION WITH LEXISCAN 05/01/2021 Lexiscan/modified Bruce nuclear stress test performed using 1-day protocol. In addition, patient achieved 2.2 METS and reached 82% MPHR. Normal myocardial perfusion. Stress LVEF 78%. Low risk study.  PCV ECHOCARDIOGRAM COMPLETE 04/24/2021 Normal LV systolic function with visual EF 60-65%. Left ventricle cavity is normal in size. Normal left ventricular wall thickness. Normal global wall motion. Normal diastolic filling pattern, normal LAP. No significant valvular heart disease. No prior study for comparison.   Left Heart Catheterization 03/18/19:  LV normal LVEF  grossly. EDP mardekly elevated Right coronary artery nondominant small, normal. Left main large, normal. Ramus intermediate large, normal. Circumflex large, dominant, normal. LAD ostial 50% stenosis.  FFR 0.89, not hemodynamically significant.   Impression: Patient developed significant respiratory distress probably combination of acute COPD exacerbation and also patient received intravenous adenosine for FFR evaluation.  Received albuterol inhalation and also oxygen supplementation with stabilization immediately.  Respiratory distress could also be related to combination of acute diastolic heart failure with markedly elevated EDP.  Patient also received IV Lasix.   Recommendation: No significant coronary disease, needs primary prevention, consider addition of Plavix as patient is allergic to aspirin in view of moderate coronary artery disease involving the ostial LAD.  Primary prevention with aggressive control of diabetes, hypertension and hyperlipidemia is indicated.  Suspect is cardiac arrest was secondary to underlying COPD with respiratory distress and severe hypoxemia as evident by similar presentation in the cardiac catheterization lab where his sats dropped down to 70s.  Smoking cessation is indicated as well.  We will follow up on the echocardiogram.   1. Left ventricular ejection fraction, by estimation, is 55 to 60%. The  left ventricle has normal function. The left ventricle has no regional  wall motion abnormalities. Left ventricular diastolic parameters were  normal.   2. Right ventricular systolic function is normal. The right ventricular  size is normal.   3. The inferior vena cava is dilated in size with >50% respiratory  variability, suggesting right atrial pressure of 8 mmHg.   EKG   EKG 11/11/21: Sinus rhythm at a rate of 70 bpm.  Left axis, left anterior fascicular block.   No evidence of ischemia or underlying injury pattern. Compared EKG on 05/09/21, no significant  change.  Assessment     ICD-10-CM   1. Coronary artery disease involving native coronary artery of native heart without angina pectoris  I25.10 EKG 12-Lead    2. Primary hypertension  I10     3. Mixed hyperlipidemia  E78.2     4. Type 2 diabetes mellitus without complication, with long-term current use of insulin (HCC)  E11.9    Z79.4        No orders of the defined types were placed in this encounter.   Medications Discontinued During This Encounter  Medication Reason   oxyCODONE-acetaminophen (PERCOCET/ROXICET) 5-325 MG tablet    ondansetron (ZOFRAN) 4 MG tablet      Recommendations:   Coronary artery disease involving native coronary artery of native heart without angina pectoris No recurrence of chest pain. He continues of guideline directed therapy. EKG remains stable without evidence of ischemia.  Primary hypertension Blood pressure  is well controlled Encouraged low sodium diet less than 2000mg  daily and incorporating an exercise regimen. He is agreeable to this. No changes to medications.  Mixed hyperlipidemia Continues on rosuvastatin 20mg  daily without myalgias. Will have labs completed at PCP visit this week.  Type 2 diabetes mellitus without complication, with long-term current use of insulin (HCC) Managed by PCP. I did discuss Ozempic with patient and benefits of this medication. He would like to discuss with PCP at next visit.  He does have dental procedure with tooth extraction planned, ok from cardiac standpoint to proceed with this.  Follow-up in 6 months, sooner if needed.    , AGNP-C 11/11/2021, 11:10 AM Office: [phone]

## 2021-11-20 ENCOUNTER — Other Ambulatory Visit: Payer: Self-pay | Admitting: Internal Medicine

## 2022-01-03 ENCOUNTER — Ambulatory Visit (INDEPENDENT_AMBULATORY_CARE_PROVIDER_SITE_OTHER): Payer: Medicaid Other | Admitting: Plastic Surgery

## 2022-01-03 ENCOUNTER — Encounter: Payer: Self-pay | Admitting: Plastic Surgery

## 2022-01-03 VITALS — BP 134/82 | HR 90 | Ht 69.0 in | Wt 224.6 lb

## 2022-01-03 DIAGNOSIS — M5020 Other cervical disc displacement, unspecified cervical region: Secondary | ICD-10-CM | POA: Diagnosis not present

## 2022-01-03 DIAGNOSIS — L905 Scar conditions and fibrosis of skin: Secondary | ICD-10-CM

## 2022-01-03 NOTE — Progress Notes (Signed)
Patient ID: Robert Rich, male    DOB: 20-Jul-1965, 56 y.o.   MRN: [id number]   Chief Complaint  Patient presents with   Consult        Skin Problem    The patient is a very nice 56 year old gentleman here for evaluation of his neck.  He underwent a cervical spine surgery with Dr. Shon Baton approximately 6 months ago.  An anterior approach was utilized.  He developed a scar contracture pretty quickly.  He has been massaging the area and using Ortho oil.  It seems to be getting tighter instead of loose.  He states that it actually inhibits him from fully extending his neck.  He has a history of COPD, diabetes, heart attack, hyperlipidemia, hypertension, stroke and obesity.  He has lost significant amount of weight.  He has noticed a difference with the weight loss and his joint pain.  He has not had any Kenalog injections to the scar contracture.    Review of Systems  Constitutional: Negative.   HENT: Negative.    Eyes: Negative.   Respiratory: Negative.  Negative for chest tightness and shortness of breath.   Cardiovascular: Negative.   Gastrointestinal: Negative.   Endocrine: Negative.   Genitourinary: Negative.   Musculoskeletal: Negative.     Past Medical History:  Diagnosis Date   COPD (chronic obstructive pulmonary disease) (HCC)    Coronary artery disease    DM (diabetes mellitus) (HCC)    Dyspnea    Heart attack (HCC)    HLD (hyperlipidemia)    HTN (hypertension)    Obesity    Stroke [facility])     Past Surgical History:  Procedure Laterality Date   ANTERIOR CERVICAL DECOMPRESSION/DISCECTOMY FUSION 4 LEVELS N/A 06/10/2021   Procedure: Anterior cervical discectomy and fusion Cervical four to seven;  Surgeon: Venita Lick, MD;  Location: [facility] OR;  Service: Orthopedics;  Laterality: N/A;   INTRAVASCULAR PRESSURE WIRE/FFR STUDY N/A 03/18/2019   Procedure: INTRAVASCULAR PRESSURE WIRE/FFR STUDY;  Surgeon: [name], MD;  Location: [facility] INVASIVE CV LAB;  Service: Cardiovascular;   Laterality: N/A;   LEFT HEART CATH AND CORONARY ANGIOGRAPHY N/A 03/18/2019   Procedure: LEFT HEART CATH AND CORONARY ANGIOGRAPHY;  Surgeon: [name], MD;  Location: [facility] INVASIVE CV LAB;  Service: Cardiovascular;  Laterality: N/A;   NO PAST SURGERIES        Current Outpatient Medications:    albuterol (VENTOLIN HFA) 108 (90 Base) MCG/ACT inhaler, Inhale 1-2 puffs into the lungs every 6 (six) hours as needed for wheezing or shortness of breath., Disp: , Rfl:    clopidogrel (PLAVIX) 75 MG tablet, Take 75 mg by mouth daily., Disp: , Rfl:    FARXIGA 10 MG TABS tablet, Take 10 mg by mouth daily., Disp: , Rfl:    folic acid (FOLVITE) 1 MG tablet, Take 1 tablet (1 mg total) by mouth daily., Disp: 30 tablet, Rfl: 2   insulin NPH-regular Human (NOVOLIN 70/30) (70-30) 100 UNIT/ML injection, Inject 50 Units into the skin 2 (two) times daily with a meal., Disp: 10 mL, Rfl: 1   ipratropium-albuterol (DUONEB) 0.5-2.5 (3) MG/3ML SOLN, Inhale 3 mLs into the lungs every 6 (six) hours as needed (shortness of breath)., Disp: , Rfl:    losartan (COZAAR) 50 MG tablet, Take 50 mg by mouth daily., Disp: , Rfl:    metFORMIN (GLUCOPHAGE) 1000 MG tablet, Take 1 tablet (1,000 mg total) by mouth 2 (two) times daily with a meal., Disp: 60 tablet, Rfl: 2  metoprolol succinate (TOPROL-XL) 25 MG 24 hr tablet, Take 1 tablet (25 mg total) by mouth daily. (Patient taking differently: Take 12.5 mg by mouth daily.), Disp: 30 tablet, Rfl: 2   montelukast (SINGULAIR) 10 MG tablet, Take 1 tablet (10 mg total) by mouth at bedtime., Disp: 30 tablet, Rfl: 1   pregabalin (LYRICA) 75 MG capsule, Take 75 mg by mouth 2 (two) times daily., Disp: , Rfl:    rosuvastatin (CRESTOR) 20 MG tablet, Take 20 mg by mouth daily., Disp: , Rfl:    thiamine 100 MG tablet, Take 2.5 tablets (250 mg total) by mouth daily. (Patient taking differently: Take 100 mg by mouth daily.), Disp: 30 tablet, Rfl: 2   umeclidinium-vilanterol (ANORO ELLIPTA) 62.5-25  MCG/ACT AEPB, INHALE 1 PUFF INTO LUNGS ONCE DAILY, Disp: 180 each, Rfl: 1   Objective:   Vitals:   01/03/22 1452  BP: 134/82  Pulse: 90  SpO2: 96%    Physical Exam Vitals and nursing note reviewed.  Constitutional:      Appearance: Normal appearance.  HENT:     Head: Normocephalic.  Neck:   Cardiovascular:     Rate and Rhythm: Normal rate.     Pulses: Normal pulses.  Pulmonary:     Effort: Pulmonary effort is normal.  Abdominal:     General: There is no distension.     Palpations: Abdomen is soft.  Musculoskeletal:        General: No swelling or deformity.  Skin:    General: Skin is warm.     Capillary Refill: Capillary refill takes less than 2 seconds.     Coloration: Skin is not jaundiced.     Findings: Lesion present. No bruising.  Neurological:     Mental Status: He is alert and oriented to person, place, and time.  Psychiatric:        Mood and Affect: Mood normal.        Behavior: Behavior normal.        Thought Content: Thought content normal.     Assessment & Plan:  Cervical disc herniation  Scar contracture  The patient may need a scar revision with Z-plasty.  In the meantime I think it is worth trying a Kenalog injection and seeing how he does.  He just needs to massage the area.  I would like to do it at least once if not twice and see if that helps improve the tightness in the neck area.  Procedure Note  Preoperative Dx: Scar contracture of neck 3 cm  Postoperative Dx: Same  Procedure: Kenalog injection to neck scar contracture 3 cc  Description of Procedure: Risks and complications were explained to the patient.  Consent was confirmed and the patient understands the risks and benefits.  The potential complications and alternatives were explained and the patient consents.  The patient expressed understanding the option of not having the procedure and the risks of a scar.  Time out was called and all information was confirmed to be correct.     The area was prepped and drapped.  Lidocaine 1% with epinepherine 0.1 cc was mixed with Kenalog 50/5 0.2 cc.  The mixture was then injected in the scar contracture of the neck.  A dressing was applied.  The patient was given instructions on how to care for the area and a follow up appointment.  Avner tolerated the procedure well and there were no complications.  Pictures were obtained of the patient and placed in the chart with the patient's  or guardian's permission.

## 2022-03-07 ENCOUNTER — Encounter: Payer: Self-pay | Admitting: Plastic Surgery

## 2022-03-07 ENCOUNTER — Ambulatory Visit (INDEPENDENT_AMBULATORY_CARE_PROVIDER_SITE_OTHER): Payer: Medicaid Other | Admitting: Plastic Surgery

## 2022-03-07 VITALS — BP 127/72 | HR 85

## 2022-03-07 DIAGNOSIS — L910 Hypertrophic scar: Secondary | ICD-10-CM

## 2022-03-07 DIAGNOSIS — L905 Scar conditions and fibrosis of skin: Secondary | ICD-10-CM

## 2022-03-07 NOTE — Progress Notes (Signed)
Procedure Note  Preoperative Dx: Hypertrophic scar of neck  Postoperative Dx: Same  Procedure: Kenalog injection to hypertrophic scar of neck 3 cm  Indication for Procedure: Hypertrophic scar  Description of Procedure: Risks and complications were explained to the patient.  Consent was confirmed and the patient understands the risks and benefits.  The potential complications and alternatives were explained and the patient consents.  The patient expressed understanding the option of not having the procedure and the risks of a scar.  Time out was called and all information was confirmed to be correct.    The area was prepped and drapped.  Lidocaine 1% with epinephrine 0.1 cc was mixed with Kenalog 50/5 0.2 cc.  The neck hypertrophic scar was cleaned with an alcohol wipe.  The mixture was then injected into the hypertrophic scar. The patient was given instructions on how to care for the area and a follow up appointment.  Robert Rich tolerated the procedure well and there were no complications.  He should continue with massage and follow-up in 6 to 8 weeks.  I want to be sure that he is got a pathway to improvement before we do surgery.

## 2022-03-28 ENCOUNTER — Encounter: Payer: Self-pay | Admitting: *Deleted

## 2022-03-28 ENCOUNTER — Ambulatory Visit (INDEPENDENT_AMBULATORY_CARE_PROVIDER_SITE_OTHER): Payer: Medicaid Other | Admitting: Internal Medicine

## 2022-03-28 ENCOUNTER — Encounter: Payer: Self-pay | Admitting: Internal Medicine

## 2022-03-28 VITALS — BP 130/70 | HR 86 | Ht 69.0 in | Wt 224.8 lb

## 2022-03-28 DIAGNOSIS — J449 Chronic obstructive pulmonary disease, unspecified: Secondary | ICD-10-CM

## 2022-03-28 DIAGNOSIS — Z87891 Personal history of nicotine dependence: Secondary | ICD-10-CM | POA: Diagnosis not present

## 2022-03-28 LAB — POCT EXHALED NITRIC OXIDE: FeNO level (ppb): 21

## 2022-03-28 MED ORDER — TRELEGY ELLIPTA 100-62.5-25 MCG/ACT IN AEPB: 1.0000 | INHALATION_SPRAY | Freq: Every day | RESPIRATORY_TRACT | 0 refills | Status: DC

## 2022-03-28 NOTE — Patient Instructions (Addendum)
My office will be contacting you by phone for referral for  lung cancer screening   - if you don't hear back from my office within one week please call us back or notify us thru MyChart and we'll address it right away.   Stop anoro and start trelegy 100 each am   Work on inhaler technique:  relax and gently blow all the way out then take a nice smooth full deep breath back in, triggering the inhaler at same time you start breathing in.  Hold breath in for at least  5 seconds if you can. Blow out Trelegy thru nose. Rinse and gargle with water when done.  If mouth or throat bother you at all,  try brushing teeth/gums/tongue with arm and hammer toothpaste/ make a slurry and gargle and spit out.   Please remember to go to the lab department   for your tests - we will call you with the results when they are available.      Please schedule a follow up office visit in 4 weeks, sooner if needed

## 2022-03-28 NOTE — Assessment & Plan Note (Signed)
Referred for LDSCT program  03/28/2022   Low-dose CT lung cancer screening is recommended for patients who are 70-56 years of age with a 20+ pack-year history of smoking and who are currently smoking or quit <=15 years ago. No coughing up blood  No unintentional weight loss of > 15 pounds in the last 6 months - pt is eligible for scanning yearly until 2036 > referred         Each maintenance medication was reviewed in detail including emphasizing most importantly the difference between maintenance and prns and under what circumstances the prns are to be triggered using an action plan format where appropriate.  Total time for H and P, chart review, counseling, reviewing hfa/dpi/neb  device(s) and generating customized AVS unique to this ACUTE office visit / same day charting = 32 min

## 2022-03-28 NOTE — Assessment & Plan Note (Addendum)
Quit smoking 02/2019 at wt 260  -  Spirometry only on 11/01/19  No obst p anoro prior to study and minimal concavity to f/v loop  - 04/30/2020  After extensive coaching inhaler device,  effectiveness =    75% > try breztri 2bid > "too jittery" but flared on anoro  - 06/27/2020  After extensive coaching inhaler device,  effectiveness =    75% > rechallenge with breztri one bid  - 08/08/2020  Improved on anoro > continue   - Sinus MRI  08/09/21 : Moderate paranasal sinus mucosal thickening, greatest in the left maxillary sinus. - 03/28/2022  After extensive coaching inhaler device,  effectiveness =    90% dpi / elipta > change trelegy 100 due to freq aecopd x 4 week trial then ov  Allergy screen 03/28/2022 >  Eos 0. /  IgE  pending  FENO  03/28/2022 = 21 - Labs ordered 03/28/2022  :   alpha one AT phenotype  pending    Group D (now reclassified as E) in terms of symptom/risk and laba/lama/ICS  therefore appropriate rx at this point >>>  trelegy and more approp Saba than he's using at present:  Reviewed: Re SABA :  I spent extra time with pt today reviewing appropriate use of albuterol for prn use on exertion with the following points: 1) saba is for relief of sob that does not improve by walking a slower pace or resting but rather if the pt does not improve after trying this first. 2) If the pt is convinced, as many are, that saba helps recover from activity faster then it's easy to tell if this is the case by re-challenging : ie stop, take the inhaler, then p 5 minutes try the exact same activity (intensity of workload) that just caused the symptoms and see if they are substantially diminished or not after saba 3) if there is an activity that reproducibly causes the symptoms, try the saba 15 min before the activity on alternate days   If in fact the saba really does help, then fine to continue to use it prn but advised may need to look closer at the maintenance regimen being used to achieve better control of  airways disease with exertion.

## 2022-03-28 NOTE — Progress Notes (Signed)
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 male former smoker with medical history significant for severe COPD, coronary artery disease, type 2 diabetes mellitus, dyslipidemia, alcohol use disorder and substance abuse with benzodiazepines and history of prior cocaine use was recently hospitalized discharged 2 weeks ago for a COPD exacerbation.  He reports that he felt well after going home but when he ran out of steroids and antibiotics his symptoms slowly began to return over the course of the last 3 days.  He became so severely short of breath today that he called EMS.  He says that he had return to work last week and was working short hours up until 3 days ago when he had increased his work hours and noticed that his shortness of breath became acutely worse.  He reports significant wheezing and increased work of breathing coughing and chest discomfort.  He denies chest pain.  He reports productive cough with yellow sputum.  He can only walk a few feet without severe shortness of breath.   Discharge Diagnoses:    Acute on chronic respiratory failure with hypoxia  -secondary to severe COPD exacerbation  --symptoms rapidly returned after he completed his steroid taper and antibiotics from recent hospitalization. -He has an outpatient pulmonary appt later this month but I requested inpatient consult given multiple admissions and ED visits.  -appreciate pulmonary follow up-->wean prednisone over 2 weeks   COPD Exacerbation -continue duonebs -de-escalate to po prednisone  - Appreciate pulmonary consult.  Discussed with Robert Rich prednisone over next 2 weeks    -Unfortunately patient could not tolerate brovana neb treatments.   -Continue Duonebs, continue pulmicort nebs  -Pulmonary willl see him again on outpatient follow up 08/24/19.   -They will arrange PFTs.  - He was given lasix 20 mg IV daily x 4 total doses during the hospitalization.     CAD  - stable, no chest pain symptoms, resumed his home heart medications.    Tobacco abuse - He reports he quit several months ago.  Polysubstance abuse  - UDS positive for benzo, opioid and barbituates.   Anxiety - alprazolam as needed only for severe symptoms.  Type 2 DM with steroid induced hyperglycemia  - increased doses of insulin and follow closely especially while on IV steroids.    -Pt will need to DC home on insulin.  He has used insulin before.  He prefers vials and syringes.  He will be on steroids for at least 2 weeks after discharge and will need insulin plus metformin to help control his steroid induced hyperglycemia -d/c home with novolin 70/30--50 units bid -instructed pt to continue checking CBGs ac/hs      overall 50% improved at d/c on prednisone tapered off 08/20/19 and about the same as at d/c but no longer using neb / maint on Anoro      History of Present Illness  08/24/2019  Pulmonary/ 1st office eval/ Robert Rich / [facility]  Chief Complaint  Patient presents with   Follow-up    shortness of breath with exertion  Dyspnea:  MMRC3 =  can't walk 100 yards even at a slow pace at a flat grade s stopping due to sob   Cough: resolved  Sleep: flat  SABA use: combivent and anoro both in am then no other saba  rec Plan A = Automatic = Always=   Anoro one click take two drags Plan B = Backup (to supplement plan A, not to replace it) Only use your albuterol inhaler as a rescue medication Try albuterol 15 min before an activity that you know would make you short of breath     Plan C = Crisis (instead of Plan B but only if Plan B stops working) - only use your albuterol  nebulizer if you first try Plan B  Make sure you check your oxygen saturations at highest level of activity  Please schedule a follow up visit in 3 months with PFTs  but call sooner if needed  - ok to Rich if you haven't got your insurance.       04/30/2020  f/u ov/Robert Rich office/Robert Rich re: GOLD 0 copd/AB Chief Complaint  Patient presents with   Follow-up    Breathing has not improved since his last visit. He has had prod cough with brown sputum for the past 5 wks. He is using his albuterol inhaler 7 x per on average and rarely uses neb.   Dyspnea: MMRC2 = can't walk a nl pace on a flat grade s sob but does fine slow and flat eg Cough: brown esp in am x 5 weeks Sleeping: poorly due to cough SABA use: way too much  02: none Covid status: x 2 last 09/26/19  Lung cancer screening: won't be due until 03/2021 rec Plan A = Automatic = Always=    Stop anoro and start breztri Take 2 puffs first thing in am and then another 2 puffs about 12 hours later.  Work on inhaler technique:  Plan B = Backup (to supplement plan A, not to replace it) Only use your albuterol inhaler as a rescue medication  Plan C = Crisis (instead of Plan B but only if Plan B stops working) - only use your albuterol nebulizer if you first try Plan B  zpak     04/17/2021  f/u ov/Robert Rich office/Robert Rich re: copd 0/AB maint on Anoro  daily   Chief Complaint  Patient presents with   Follow-up    Surgery clearance  Patient is going to fax over updated med list     Dyspnea:  walking track 100 yards and has to stop only when  at rapid walk but can do 200 yards slow pace fine  Cough: sputter a bit in am / min mucoid resolves  p 20 min  Sleeping: 45 degrees  SABA use: none now  02: none  Covid status: vax x 3  Lung cancer screening: done 04/09/20  Rec You are cleared for neck surgery  I'll be referring you for lung cancer screening  Pulmonary follow up is as needed for refills    03/28/2022  f/u ov/Robert Rich office/Robert Rich  re: AB exac maint on anoro   Chief Complaint  Patient presents with   Acute Visit    Acute- New SOB and cough with green mucus    Dyspnea:  still doing track 12 min  x 4 days per week  Cough: assoc with nasal congestion and nasty drainage flared twice in last 6 weeks prior to OV   Sleeping:flat bed 2 pillows under head  SABA use: twice a month hfa/ every 6  hours x 2 weeks  02: none  Covid status: vax x 3 / infection 2023  Lung cancer screening: referred today   No obvious day to day or daytime variability or assoc   mucus plugs or hemoptysis or cp or chest tightness, subjective wheeze or overt  hb symptoms.   Sleeping most noct  without nocturnal  or early am exacerbation  of respiratory  c/o's or need for noct saba. Also denies any obvious fluctuation of symptoms with weather or environmental changes or other aggravating or alleviating factors except as outlined above   No unusual exposure hx or h/o childhood pna/ asthma or knowledge of premature birth.  Current Allergies, Complete Past Medical History, Past Surgical History, Family History, and Social History were reviewed in [employer] record.  ROS  The following are not active complaints unless bolded Hoarseness, sore throat, dysphagia, dental problems, itching, sneezing,  nasal congestion or discharge of excess mucus or purulent secretions, ear ache,   fever, chills, sweats, unintended wt loss or wt gain, classically pleuritic or exertional cp,  orthopnea pnd or arm/hand swelling  or leg swelling, presyncope, palpitations, abdominal pain, anorexia, nausea, vomiting, diarrhea  or change in bowel habits or change in bladder habits, change in stools or change in urine, dysuria, hematuria,  rash, arthralgias, visual complaints, headache, numbness, weakness or ataxia or problems with walking or coordination,  change in mood or  memory.        Current Meds  Medication Sig   albuterol (VENTOLIN HFA) 108 (90 Base)  MCG/ACT inhaler Inhale 1-2 puffs into the lungs every 6 (six) hours as needed for wheezing or shortness of breath.   clopidogrel (PLAVIX) 75 MG tablet Take 75 mg by mouth daily.   doxycycline (VIBRA-TABS) 100 MG tablet Take by mouth.   FARXIGA 10 MG TABS tablet Take 10 mg by mouth daily.   folic acid (FOLVITE) 1 MG tablet Take 1 tablet (1 mg total) by mouth daily.   insulin NPH-regular Human (NOVOLIN 70/30) (70-30) 100 UNIT/ML injection Inject 50 Units into the skin 2 (two) times daily with a meal.   ipratropium-albuterol (DUONEB) 0.5-2.5 (3) MG/3ML SOLN Inhale 3 mLs into the lungs every 6 (six) hours as needed (shortness of breath).   losartan (COZAAR) 50 MG tablet Take 50 mg by mouth daily.   metFORMIN (GLUCOPHAGE) 1000 MG tablet Take 1 tablet (1,000 mg total) by mouth 2 (two) times daily with a meal.   metoprolol succinate (TOPROL-XL) 25 MG 24 hr tablet Take 1 tablet (25 mg total) by mouth daily. (Patient taking differently: Take 12.5 mg by mouth daily.)   montelukast (SINGULAIR) 10 MG tablet Take 1 tablet (10 mg total) by mouth at bedtime.   OZEMPIC, 1 MG/DOSE, 4 MG/3ML SOPN Inject 1 mg into the skin once a week.   predniSONE (DELTASONE) 20 MG tablet Take 2 tabs by mouth each morning for 5 days   pregabalin (LYRICA) 300 MG capsule Take 300 mg by mouth 2 (two) times daily.   rosuvastatin (CRESTOR) 20 MG tablet Take 20 mg by mouth daily.   thiamine 100 MG tablet Take 2.5 tablets (250 mg total) by mouth daily. (Patient taking differently: Take 100 mg by mouth daily.)   umeclidinium-vilanterol (ANORO ELLIPTA) 62.5-25 MCG/ACT AEPB INHALE 1 PUFF INTO LUNGS ONCE DAILY                  Past Medical History:  Diagnosis Date   COPD (chronic obstructive pulmonary disease) (Robert Rich)  Coronary artery disease    DM (diabetes mellitus) ([place])    Heart attack ([place])    HLD (hyperlipidemia)    HTN (hypertension)    Obesity    Stroke ([place])          Objective:    Wts  03/28/2022          224   04/17/2021        239 08/08/2020        233  06/27/2020          232  04/30/2020          240   11/29/19 248 lb 6.4 oz (112.7 kg)  11/01/19 250 lb (113.4 kg)  10/25/19 250 lb (113.4 kg)     Vital signs reviewed  03/28/2022  - Note at rest 02 sats  96% on RA    General appearance:    amb wm nasal tone to voice/ prominent pseudowheeze   HEENT : Oropharynx  clear   Nasal turbinates mod swelling non-specific    NECK :  without  apparent JVD/ palpable Nodes/TM    LUNGS: no acc muscle use,  Min barrel  contour chest wall with bilateral  slightly decreased bs s audible wheeze and  without cough on insp or exp maneuvers and min  Hyperresonant  to  percussion bilaterally    CV:  RRR  no s3 or murmur or increase in P2, and no edema   ABD:  soft and nontender with pos end  insp Hoover's  in the supine position.  No bruits or organomegaly appreciated   MS:  Nl gait/ ext warm without deformities Or obvious joint restrictions  calf tenderness, cyanosis or clubbing     SKIN: warm and dry without lesions    NEURO:  alert, approp, nl sensorium with  no motor or cerebellar deficits apparent.                     Assessment

## 2022-04-03 ENCOUNTER — Other Ambulatory Visit: Payer: Self-pay | Admitting: *Deleted

## 2022-04-03 DIAGNOSIS — Z122 Encounter for screening for malignant neoplasm of respiratory organs: Secondary | ICD-10-CM

## 2022-04-03 DIAGNOSIS — Z87891 Personal history of nicotine dependence: Secondary | ICD-10-CM

## 2022-04-09 ENCOUNTER — Ambulatory Visit (INDEPENDENT_AMBULATORY_CARE_PROVIDER_SITE_OTHER): Payer: Medicaid Other | Admitting: Physician Assistant

## 2022-04-09 ENCOUNTER — Encounter: Payer: Self-pay | Admitting: Physician Assistant

## 2022-04-09 DIAGNOSIS — Z87891 Personal history of nicotine dependence: Secondary | ICD-10-CM | POA: Diagnosis not present

## 2022-04-09 NOTE — Progress Notes (Signed)
Virtual Visit via Telephone Note  I connected with Neta Ehlers on 04/09/22 at  9:30 AM EDT by telephone and verified that I am speaking with the correct person using two identifiers.  Location: Patient: home Provider: working virtually from home    I discussed the limitations, risks, security and privacy concerns of performing an evaluation and management service by telephone and the availability of in person appointments. I also discussed with the patient that there may be a patient responsible charge related to this service. The patient expressed understanding and agreed to proceed.       Shared Decision Making Visit Lung Cancer Screening Program [phone])   Eligibility: Age 57 y.o. Pack Years Smoking History Calculation 34 (# packs/per year x # years smoked) Recent History of coughing up blood  no Unexplained weight loss? no ( >Than 15 pounds within the last 6 months ) Prior History Lung / other cancer no (Diagnosis within the last 5 years already requiring surveillance chest CT Scans). Smoking Status Former Smoker Former Smokers: Years since quit: 2 years  Quit Date: 2022  Visit Components: Discussion included one or more decision making aids. yes Discussion included risk/benefits of screening. yes Discussion included potential follow up diagnostic testing for abnormal scans. yes Discussion included meaning and risk of over diagnosis. yes Discussion included meaning and risk of False Positives. yes Discussion included meaning of total radiation exposure. yes  Counseling Included: Importance of adherence to annual lung cancer LDCT screening. yes Impact of comorbidities on ability to participate in the program. yes Ability and willingness to under diagnostic treatment. yes  Smoking Cessation Counseling: Former Smokers:  Discussed the importance of maintaining cigarette abstinence. yes Diagnosis Code: Personal History of Nicotine Dependence. [id number] Information  about tobacco cessation classes and interventions provided to patient. Yes Patient provided with "ticket" for LDCT Scan. N/a Written Order for Lung Cancer Screening with LDCT placed in Epic. Yes (CT Chest Lung Cancer Screening Low Dose W/O CM) [id number] Z12.2-Screening of respiratory organs Z87.891-Personal history of nicotine dependence  I spent 25 minutes of face to face time/virtual visit time  with the pateint discussing the risks and benefits of lung cancer screening. We took the time to pause the power point at intervals to allow for questions to be asked and answered to ensure understanding. We discussed that he had taken the single most powerful action possible to decrease his risk of developing lung cancer when he quit smoking. I counseled him to remain smoke free, and to contact me if he ever had the desire to smoke again so that I can provide resources and tools to help support the effort to remain smoke free. We discussed the time and location of the scan, and that either  Doroteo Glassman RN, Joella Prince, RN or I  or I will call / send a letter with the results within  24-72 hours of receiving them. he has the office contact information in the event he needs to speak with me,  he verbalized understanding of all of the above and had no further questions upon leaving the office.     I explained to the patient that there has been a high incidence of coronary artery disease noted on these exams. I explained that this is a non-gated exam therefore degree or severity cannot be determined. This patient is on statin therapy. I have asked the patient to follow-up with their PCP regarding any incidental finding of coronary artery disease and management with diet or medication as  they feel is clinically indicated. The patient verbalized understanding of the above and had no further questions.      Otilio Carpen Magdiel Bartles, PA-C

## 2022-04-09 NOTE — Patient Instructions (Signed)
Thank you for participating in the [facility]. It was our pleasure to meet you today. We will call you with the results of your scan within the next few days. Your scan will be assigned a Lung RADS category score by the physicians reading the scans.  This Lung RADS score determines follow up scanning.  See below for description of categories, and follow up screening recommendations. We will be in touch to schedule your follow up screening annually or based on recommendations of our providers. We will fax a copy of your scan results to your Primary Care Physician, or the physician who referred you to the program, to ensure they have the results. Please call the office if you have any questions or concerns regarding your scanning experience or results.  Our office number is [phone]. Please speak with Denise Phelps, RN. , or  Denise Buckner RN, They are  our Lung Cancer Screening RN.'s If They are unavailable when you call, Please leave a message on the voice mail. We will return your call at our earliest convenience.This voice mail is monitored several times a day.  Remember, if your scan is normal, we will scan you annually as long as you continue to meet the criteria for the program. (Age 50-80, Current smoker or smoker who has quit within the last 15 years). If you are a smoker, remember, quitting is the single most powerful action that you can take to decrease your risk of lung cancer and other pulmonary, breathing related problems. We know quitting is hard, and we are here to help.  Please let us know if there is anything we can do to help you meet your goal of quitting. If you are a former smoker, congratulations. We are proud of you! Remain smoke free! Remember you can refer friends or family members through the number above.  We will screen them to make sure they meet criteria for the program. Thank you for helping us take better care of you by  participating in Lung Screening.  You can receive free nicotine replacement therapy ( patches, gum or mints) by calling 1-800-QUIT NOW. Please call so we can get you on the path to becoming  a non-smoker. I know it is hard, but you can do this!  Lung RADS Categories:  Lung RADS 1: no nodules or definitely non-concerning nodules.  Recommendation is for a repeat annual scan in 12 months.  Lung RADS 2:  nodules that are non-concerning in appearance and behavior with a very low likelihood of becoming an active cancer. Recommendation is for a repeat annual scan in 12 months.  Lung RADS 3: nodules that are probably non-concerning , includes nodules with a low likelihood of becoming an active cancer.  Recommendation is for a 6-month repeat screening scan. Often noted after an upper respiratory illness. We will be in touch to make sure you have no questions, and to schedule your 6-month scan.  Lung RADS 4 A: nodules with concerning findings, recommendation is most often for a follow up scan in 3 months or additional testing based on our provider's assessment of the scan. We will be in touch to make sure you have no questions and to schedule the recommended 3 month follow up scan.  Lung RADS 4 B:  indicates findings that are concerning. We will be in touch with you to schedule additional diagnostic testing based on our provider's  assessment of the scan.  Other options for assistance in smoking cessation (   As covered by your insurance benefits)  Hypnosis for smoking cessation  [facility] [employer] [phone]  Acupuncture for smoking cessation  [facility] [phone]   

## 2022-04-11 ENCOUNTER — Encounter: Payer: Self-pay | Admitting: Acute Care

## 2022-04-11 ENCOUNTER — Ambulatory Visit (HOSPITAL_COMMUNITY)
Admission: RE | Admit: 2022-04-11 | Discharge: 2022-04-11 | Disposition: A | Discharge status: Home / Self Care | Payer: Medicaid Other | Source: Ambulatory Visit | Attending: Acute Care | Admitting: Acute Care

## 2022-04-11 DIAGNOSIS — Z87891 Personal history of nicotine dependence: Secondary | ICD-10-CM | POA: Diagnosis present

## 2022-04-11 DIAGNOSIS — Z122 Encounter for screening for malignant neoplasm of respiratory organs: Secondary | ICD-10-CM | POA: Diagnosis present

## 2022-04-15 ENCOUNTER — Other Ambulatory Visit: Payer: Self-pay | Admitting: Acute Care

## 2022-04-15 DIAGNOSIS — Z122 Encounter for screening for malignant neoplasm of respiratory organs: Secondary | ICD-10-CM

## 2022-04-15 DIAGNOSIS — Z87891 Personal history of nicotine dependence: Secondary | ICD-10-CM

## 2022-04-18 ENCOUNTER — Ambulatory Visit (INDEPENDENT_AMBULATORY_CARE_PROVIDER_SITE_OTHER): Payer: Medicaid Other | Admitting: Plastic Surgery

## 2022-04-18 ENCOUNTER — Encounter: Payer: Self-pay | Admitting: Plastic Surgery

## 2022-04-18 VITALS — BP 132/78 | HR 96

## 2022-04-18 DIAGNOSIS — L910 Hypertrophic scar: Secondary | ICD-10-CM | POA: Diagnosis not present

## 2022-04-18 DIAGNOSIS — L905 Scar conditions and fibrosis of skin: Secondary | ICD-10-CM

## 2022-04-18 NOTE — Progress Notes (Signed)
Procedure Note  Preoperative Dx: Hypertrophic scar of neck  Postoperative Dx: Same  Procedure: Kenalog injection to hypertrophic scar of neck   Description of Procedure: Risks and complications were explained to the patient.  Consent was confirmed and the patient understands the risks and benefits.  The potential complications and alternatives were explained and the patient consents.  The patient expressed understanding the option of not having the procedure and the risks of a scar.  Time out was called and all information was confirmed to be correct.    The area was prepped and drapped.  Lidocaine 1% with epinephrine 0.1 cc was mixed with Kenalog 50/5 mg 0.2 cc.  The hypertrophic scar was injected.  The patient was given instructions on how to care for the area and a follow up appointment.  Khyon tolerated the procedure well and there were no complications. States that Kenalog did not help at all I think it is reasonable to do a Z-plasty.  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  Pictures were obtained of the patient and placed in the chart with the patient's or guardian's permission.

## 2022-05-06 NOTE — Progress Notes (Unsigned)
Robert Rich, male    DOB: [birth year]     MRN: [id number]   Brief patient profile:  57  yowm body shop owner  quit smoking 2019-04-02 at wt 200 assoc  progressive sob x 2011 and required neb x 2020 and then admitted with IHD/ sudden death 2019-04-02 and back to baseline  then admit with aecopd:     Admit date: 08/04/2019 Discharge date: 08/11/2019   Brief/Interim Summary: 57 y.o. male former smoker with medical history significant for severe COPD, coronary artery disease, type 2 diabetes mellitus, dyslipidemia, alcohol use disorder and substance abuse with benzodiazepines and history of prior cocaine use was recently hospitalized discharged 2 weeks ago for a COPD exacerbation.  He reports that he felt well after going home but when he ran out of steroids and antibiotics his symptoms slowly began to return over the course of the last 3 days.  He became so severely short of breath today that he called EMS.  He says that he had return to work last week and was working short hours up until 3 days ago when he had increased his work hours and noticed that his shortness of breath became acutely worse.  He reports significant wheezing and increased work of breathing coughing and chest discomfort.  He denies chest pain.  He reports productive cough with yellow sputum.  He can only walk a few feet without severe shortness of breath.   Discharge Diagnoses:    Acute on chronic respiratory failure with hypoxia  -secondary to severe COPD exacerbation  --symptoms rapidly returned after he completed his steroid taper and antibiotics from recent hospitalization. -He has an outpatient pulmonary appt later this month but I requested inpatient consult given multiple admissions and ED visits.  -appreciate pulmonary follow up-->wean prednisone over 2 weeks   COPD Exacerbation -continue duonebs -de-escalate to po prednisone  - Appreciate pulmonary consult.  Discussed with Dr. Halina Maidens prednisone over next 2 weeks    -Unfortunately patient could not tolerate brovana neb treatments.   -Continue Duonebs, continue pulmicort nebs  -Pulmonary willl see him again on outpatient follow up 08/24/19.   -They will arrange PFTs.  - He was given lasix 20 mg IV daily x 4 total doses during the hospitalization.     CAD  - stable, no chest pain symptoms, resumed his home heart medications.    Tobacco abuse - He reports he quit several months ago.  Polysubstance abuse  - UDS positive for benzo, opioid and barbituates.   Anxiety - alprazolam as needed only for severe symptoms.  Type 2 DM with steroid induced hyperglycemia  - increased doses of insulin and follow closely especially while on IV steroids.    -Pt will need to DC home on insulin.  He has used insulin before.  He prefers vials and syringes.  He will be on steroids for at least 2 weeks after discharge and will need insulin plus metformin to help control his steroid induced hyperglycemia -d/c home with novolin 70/30--50 units bid -instructed pt to continue checking CBGs ac/hs      overall 50% improved at d/c on prednisone tapered off 08/20/19 and about the same as at d/c but no longer using neb / maint on Anoro      History of Present Illness  08/24/2019  Pulmonary/ 1st office eval/ Robert Rich / [place] [facility]  Chief Complaint  Patient presents with   Follow-up    shortness of breath with exertion  Dyspnea:  MMRC3 = can't  walk 100 yards even at a slow pace at a flat grade s stopping due to sob   Cough: resolved  Sleep: flat  SABA use: combivent and anoro both in am then no other saba  rec Plan A = Automatic = Always=   Anoro one click take two drags Plan B = Backup (to supplement plan A, not to replace it) Only use your albuterol inhaler as a rescue medication Try albuterol 15 min before an activity that you know would make you short of breath     Plan C = Crisis (instead of Plan B but only if Plan B stops working) - only use your albuterol  nebulizer if you first try Plan B  Make sure you check your oxygen saturations at highest level of activity  Please schedule a follow up visit in 3 months with PFTs  but call sooner if needed  - ok to delay if you haven't got your insurance.    03/28/2022  f/u ov/[place] office/Robert Rich re: AB exac x 6 weeks  maint on anoro   Chief Complaint  Patient presents with   Acute Visit    Acute- New SOB and cough with green mucus   Dyspnea:  still doing track 12 min  x 4 days per week  Cough: assoc with nasal congestion and nasty drainage flared twice in last 6 weeks prior to OV   Sleeping:flat bed 2 pillows under head  SABA use: twice a month hfa/ every 6 hours x 2 weeks  02: none  Covid status: vax x 3 / infection 2023  Lung cancer screening: referred today Rec My office will be contacting you by phone for referral for  lung cancer screening    Stop anoro and start trelegy 100 each am  Work on inhaler technique:    Please remember to go to the lab department   > did not go       05/07/2022  f/u ov/[place] office/Robert Rich re: AB  maint on Trelegy   and steadily worse Chief Complaint  Patient presents with   Follow-up    Pt f/u states that he is having coughing/wheezing w/ phlegm ranging from bloody, green, and brownish. Onset was 03/30/2022  Dyspnea:  ok if not coughing  Cough: variably nasty from nose and chest day > noct  Sleeping: sleeps ok flat 2 pillows  SABA use: none not hfa or neb  02: none    Lung cancer screening: every march    No obvious day to day or daytime variability or assoc  mucus plugs or cp or chest tightness, or  hb symptoms.     Also denies any obvious fluctuation of symptoms with weather or environmental changes or other aggravating or alleviating factors except as outlined above   No unusual exposure hx or h/o childhood pna/ asthma or knowledge of premature birth.  Current Allergies, Complete Past Medical History, Past Surgical History, Family History, and  Social History were reviewed in [employer] record.  ROS  The following are not active complaints unless bolded Hoarseness, sore throat, dysphagia, dental problems, itching, sneezing,  nasal congestion or discharge of excess mucus or purulent secretions, ear ache,   fever, chills, sweats, unintended wt loss or wt gain, classically pleuritic or exertional cp,  orthopnea pnd or arm/hand swelling  or leg swelling, presyncope, palpitations, abdominal pain, anorexia, nausea, vomiting, diarrhea  or change in bowel habits or change in bladder habits, change in stools or change in urine, dysuria, hematuria,  rash, arthralgias, visual complaints, headache, numbness, weakness or ataxia or problems with walking or coordination,  change in mood or  memory.        Current Meds  Medication Sig   albuterol (VENTOLIN HFA) 108 (90 Base) MCG/ACT inhaler Inhale 1-2 puffs into the lungs every 6 (six) hours as needed for wheezing or shortness of breath.   clopidogrel (PLAVIX) 75 MG tablet Take 75 mg by mouth daily.   FARXIGA 10 MG TABS tablet Take 10 mg by mouth daily.   Fluticasone-Umeclidin-Vilant (TRELEGY ELLIPTA) 100-62.5-25 MCG/ACT AEPB Inhale 1 puff into the lungs daily.   folic acid (FOLVITE) 1 MG tablet Take 1 tablet (1 mg total) by mouth daily.   insulin NPH-regular Human (NOVOLIN 70/30) (70-30) 100 UNIT/ML injection Inject 50 Units into the skin 2 (two) times daily with a meal.   ipratropium-albuterol (DUONEB) 0.5-2.5 (3) MG/3ML SOLN Inhale 3 mLs into the lungs every 6 (six) hours as needed (shortness of breath).   losartan (COZAAR) 50 MG tablet Take 50 mg by mouth daily.   metFORMIN (GLUCOPHAGE) 1000 MG tablet Take 1 tablet (1,000 mg total) by mouth 2 (two) times daily with a meal.   metoprolol succinate (TOPROL-XL) 25 MG 24 hr tablet Take 1 tablet (25 mg total) by mouth daily. (Patient taking differently: Take 12.5 mg by mouth daily.)   montelukast (SINGULAIR) 10 MG tablet Take 1  tablet (10 mg total) by mouth at bedtime.   OZEMPIC, 1 MG/DOSE, 4 MG/3ML SOPN Inject 1 mg into the skin once a week.   pregabalin (LYRICA) 300 MG capsule Take 300 mg by mouth 2 (two) times daily.   rosuvastatin (CRESTOR) 20 MG tablet Take 20 mg by mouth daily.   thiamine 100 MG tablet Take 2.5 tablets (250 mg total) by mouth daily. (Patient taking differently: Take 100 mg by mouth daily.)                 Past Medical History:  Diagnosis Date   COPD (chronic obstructive pulmonary disease) (HCC)    Coronary artery disease    DM (diabetes mellitus) (HCC)    Heart attack (HCC)    HLD (hyperlipidemia)    HTN (hypertension)    Obesity    Stroke (HCC)          Objective:    Wts  05/07/2022        222  03/28/2022          224  04/17/2021        239 08/08/2020        233  06/27/2020          232  04/30/2020          240   11/29/19 248 lb 6.4 oz (112.7 kg)  11/01/19 250 lb (113.4 kg)  10/25/19 250 lb (113.4 kg)    Vital signs reviewed  05/07/2022  - Note at rest 02 sats  98% on RA   General appearance:    amb wm very harsh coughing fits    HEENT : Oropharynx  clear   Nasal turbinates mod edema   NECK :  without  apparent JVD/ palpable Nodes/TM    LUNGS: no acc muscle use,  Min barrel  contour chest wall with bilateral  slightly decreased bs s audible wheeze and  without cough on insp or exp maneuvers and min  Hyperresonant  to  percussion bilaterally    CV:  RRR  no s3 or murmur or increase in P2,  and no edema   ABD:  soft and nontender   MS:  Nl gait/ ext warm without deformities Or obvious joint restrictions  calf tenderness, cyanosis or clubbing     SKIN: warm and dry without lesions    NEURO:  alert, approp, nl sensorium with  no motor or cerebellar deficits apparent.                  I personally reviewed images and agree with radiology impression as follows:   Chest LDSCT     04/11/22     1. Lung-RADS 2, benign appearance or behavior. Continue  annual screening with low-dose chest CT without contrast in 12 months. 2. Coronary artery calcifications, aortic Atherosclerosis (ICD10-I70.0) and Emphysema (ICD10-J43.9).       Assessment

## 2022-05-07 ENCOUNTER — Ambulatory Visit (INDEPENDENT_AMBULATORY_CARE_PROVIDER_SITE_OTHER): Payer: Medicaid Other | Admitting: Internal Medicine

## 2022-05-07 ENCOUNTER — Encounter: Payer: Self-pay | Admitting: Internal Medicine

## 2022-05-07 VITALS — BP 119/81 | HR 70 | Ht 69.0 in | Wt 222.0 lb

## 2022-05-07 DIAGNOSIS — J449 Chronic obstructive pulmonary disease, unspecified: Secondary | ICD-10-CM

## 2022-05-07 MED ORDER — FAMOTIDINE 20 MG PO TABS: ORAL_TABLET | ORAL | 11 refills | Status: DC

## 2022-05-07 MED ORDER — PANTOPRAZOLE SODIUM 40 MG PO TBEC: 40.0000 mg | DELAYED_RELEASE_TABLET | Freq: Every day | ORAL | 2 refills | Status: DC

## 2022-05-07 MED ORDER — PREDNISONE 10 MG PO TABS: ORAL_TABLET | ORAL | 0 refills | Status: DC

## 2022-05-07 MED ORDER — ACETAMINOPHEN-CODEINE 300-30 MG PO TABS: 1.0000 | ORAL_TABLET | ORAL | 0 refills | Status: AC | PRN

## 2022-05-07 MED ORDER — AMOXICILLIN-POT CLAVULANATE 875-125 MG PO TABS: 1.0000 | ORAL_TABLET | Freq: Two times a day (BID) | ORAL | 0 refills | Status: DC

## 2022-05-07 NOTE — Assessment & Plan Note (Addendum)
Quit smoking 02/2019 at wt 260  -  Spirometry only on 11/01/19  No obst p anoro prior to study and minimal concavity to f/v loop  - 04/30/2020  After extensive coaching inhaler device,  effectiveness =    75% > try breztri 2bid > "too jittery" but flared on anoro  - 06/27/2020  After extensive coaching inhaler device,  effectiveness =    75% > rechallenge with breztri one bid  - 08/08/2020  Improved on anoro > continue  - Sinus MRI  08/09/21 : Moderate paranasal sinus mucosal thickening, greatest in the left maxillary sinus. - 03/28/2022  After extensive coaching inhaler device,  effectiveness =    90% dpi / elipta > change trelegy 100 due to freq aecopd x 4 week trial then ov   FENO  03/28/2022 = 21 - LDSCT   04/11/22  Mild centrilobularemphysema. - Labs ordered 05/07/2022  :  allergy profile   alpha one AT phenotype     - d/c trelegy rx as uacs 05/07/2022 with just duoneb to reduce upper airway irritability >>>   Strongly now favor dx over UACS over AB.  Upper airway cough syndrome (previously labeled PNDS),  is so named because it's frequently impossible to sort out how much is  CR/sinusitis with freq throat clearing (which can be related to primary GERD)   vs  causing  secondary (" extra esophageal")  GERD from wide swings in gastric pressure that occur with throat clearing, often  promoting self use of mint and menthol lozenges that reduce the lower esophageal sphincter tone and exacerbate the problem further in a cyclical fashion.   These are the same pts (now being labeled as having "irritable larynx syndrome" by some cough centers) who not infrequently have a history of having failed to tolerate ace inhibitors,  dry powder inhalers(anoro and trelegy in this case)  or biphosphonates or report having atypical/extraesophageal reflux symptoms that don't respond to standard doses of PPI  and are easily confused as having aecopd or asthma flares by even experienced allergists/ pulmonologists (myself included).    Of the three most common causes of  Sub-acute / recurrent or chronic cough, only one (GERD)  can actually contribute to/ trigger  the other two (asthma and post nasal drip syndrome)  and perpetuate the cylce of cough.  While not intuitively obvious, many patients with chronic low grade reflux do not cough until there is a primary insult that disturbs the protective epithelial barrier and exposes sensitive nerve endings.   This is typically viral but can due to PNDS and  either latter more likely to apply here.     >>>the point is that once this occurs, it is difficult to eliminate the cycle  using anything but a maximally effective acid suppression regimen at least in the short run, accompanied by an appropriate diet to address non acid GERD and control / eliminate the cough itself for at least 3 days with tylenol #3 and 10 rx augmentin for underlying sinusitis >>> also so added 6 day taper off  Prednisone starting at 40 mg per day in case of component of Th-2 driven upper or lower airways inflammation (if cough responds short term only to relapse before return while will on full rx for uacs (as above), then  that would point to allergic rhinitis/ asthma or eos bronchitis as alternative dx)  then regroup in 2 weeks with all meds in hand using a trust but verify approach to confirm accurate Medication  Reconciliation The principal here is that until we are certain that the  patients are doing what we've asked, it makes no sense to ask them to do more.          Each maintenance medication was reviewed in detail including emphasizing most importantly the difference between maintenance and prns and under what circumstances the prns are to be triggered using an action plan format where appropriate.  Total time for H and P, chart review, counseling, reviewing neb device(s) and generating customized AVS unique to this office visit / same day charting > 30 min for    refractory respiratory  symptoms of  uncertain etiology

## 2022-05-07 NOTE — Patient Instructions (Addendum)
Stop trelegy   Augmentin 875 mg take one pill twice daily  X 10 days - take at breakfast and supper with large glass of water.  It would help reduce the usual side effects (diarrhea and yeast infections) if you ate cultured yogurt at lunch.    For cough > mucinex dm 1200 mg (otc) every 12 hours and supplement with tylenol #3 one every 4 hours as needed with goal of no coughing at all   Prednisone 10 mg take  4 each am x 2 days,   2 each am x 2 days,  1 each am x 2 days and stop   Duoneb up to 4 x daily if needed for breathing   Pantoprazole (protonix) 40 mg   Take  30-60 min before first meal of the day and Pepcid (famotidine)  20 mg after supper until return to office - this is the best way to tell whether stomach acid is contributing to your problem.    GERD (REFLUX)  is an extremely common cause of respiratory symptoms just like yours , many times with no obvious heartburn at all.    It can be treated with medication, but also with lifestyle changes including elevation of the head of your bed (ideally with 6 -8inch blocks under the headboard of your bed),  Smoking cessation, avoidance of late meals, excessive alcohol, and avoid fatty foods, chocolate, peppermint, colas, red wine, and acidic juices such as orange juice.  NO MINT OR MENTHOL PRODUCTS SO NO COUGH DROPS  USE SUGARLESS CANDY INSTEAD (Jolley ranchers or Stover's or Life Savers) or even ice chips will also do - the key is to swallow to prevent all throat clearing. NO OIL BASED VITAMINS - use powdered substitutes.  Avoid fish oil when coughing.   Please remember to go to the lab department   for your tests - we will call you with the results when they are available.     Please schedule a follow up office visit in 2 weeks, sooner if needed  with all medications /inhalers/ solutions in hand so we can verify exactly what you are taking. This includes all medications from all doctors and over the counters

## 2022-05-12 LAB — CBC WITH DIFFERENTIAL/PLATELET
Basophils Absolute: 0.1 10*3/uL (ref 0.0–0.2)
Basos: 1 %
EOS (ABSOLUTE): 0.3 10*3/uL (ref 0.0–0.4)
Eos: 3 %
Hematocrit: 47.9 % (ref 37.5–51.0)
Hemoglobin: 15.1 g/dL (ref 13.0–17.7)
Immature Grans (Abs): 0 10*3/uL (ref 0.0–0.1)
Immature Granulocytes: 0 %
Lymphocytes Absolute: 3.4 10*3/uL — ABNORMAL HIGH (ref 0.7–3.1)
Lymphs: 39 %
MCH: 27.6 pg (ref 26.6–33.0)
MCHC: 31.5 g/dL (ref 31.5–35.7)
MCV: 87 fL (ref 79–97)
Monocytes Absolute: 0.6 10*3/uL (ref 0.1–0.9)
Monocytes: 6 %
Neutrophils Absolute: 4.4 10*3/uL (ref 1.4–7.0)
Neutrophils: 51 %
Platelets: 423 10*3/uL (ref 150–450)
RBC: 5.48 x10E6/uL (ref 4.14–5.80)
RDW: 13.5 % (ref 11.6–15.4)
WBC: 8.7 10*3/uL (ref 3.4–10.8)

## 2022-05-12 LAB — ALPHA-1-ANTITRYPSIN PHENOTYP: A-1 Antitrypsin: 147 mg/dL (ref 101–187)

## 2022-05-12 LAB — IGE: IgE (Immunoglobulin E), Serum: 256 IU/mL (ref 6–495)

## 2022-05-13 ENCOUNTER — Ambulatory Visit: Payer: Medicaid Other

## 2022-05-13 ENCOUNTER — Ambulatory Visit: Payer: Medicaid Other | Admitting: Internal Medicine

## 2022-05-16 ENCOUNTER — Telehealth: Payer: Self-pay

## 2022-05-16 NOTE — Telephone Encounter (Signed)
No PA required for cpt codes: 16109, 15004, C527.1, C527.2. Vallery Sa file to schedule surgery.

## 2022-05-19 ENCOUNTER — Ambulatory Visit: Payer: Medicaid Other | Admitting: Internal Medicine

## 2022-05-19 ENCOUNTER — Encounter: Payer: Self-pay | Admitting: Internal Medicine

## 2022-05-19 VITALS — BP 109/82 | HR 65 | Ht 69.0 in | Wt 218.0 lb

## 2022-05-19 DIAGNOSIS — I2510 Atherosclerotic heart disease of native coronary artery without angina pectoris: Secondary | ICD-10-CM

## 2022-05-19 DIAGNOSIS — I10 Essential (primary) hypertension: Secondary | ICD-10-CM

## 2022-05-19 DIAGNOSIS — E782 Mixed hyperlipidemia: Secondary | ICD-10-CM

## 2022-05-19 DIAGNOSIS — R0609 Other forms of dyspnea: Secondary | ICD-10-CM

## 2022-05-19 NOTE — Progress Notes (Signed)
Primary Physician/Referring:  Rebecka Apley, NP  Patient ID: Robert Rich, male    DOB: 1965-12-22, 57 y.o.   MRN: [id number]  Chief Complaint  Patient presents with   Coronary Artery Disease   Follow-up   Results   HPI:    Robert Rich  is a 57 y.o. Caucasian male with hypertension, hyperlipidemia, uncontrolled DM, obesity, CAD with coronary angiography revealing ostial LAD 50% stenosis in 02/2019, severe COPD, vertebral artery dissection (2016).  Patient was admitted 02/2019 following cardiac arrest which was felt to be related to respiratory failure with underlying severe COPD.  Patient notably quit smoking and drinking alcohol and 02/2019.  Patient presents for 6 month follow up.  He remains asymptomatic from a cardiovascular standpoint, but he was recently found to have 18 nodules in his lungs and possible lung cancer. He cannot breath comfortably even at rest. He has follow-up with pulmonology on Thursday to get the results of his biopsy. He did quit smoking 3 years ago.  Denies chest pain, orthopnea, leg edema, syncope, near syncope.  Past Medical History:  Diagnosis Date   COPD (chronic obstructive pulmonary disease)    Coronary artery disease    DM (diabetes mellitus)    Dyspnea    Heart attack    HLD (hyperlipidemia)    HTN (hypertension)    Obesity    Stroke    Past Surgical History:  Procedure Laterality Date   ANTERIOR CERVICAL DECOMPRESSION/DISCECTOMY FUSION 4 LEVELS N/A 06/10/2021   Procedure: Anterior cervical discectomy and fusion Cervical four to seven;  Surgeon: Venita Lick, MD;  Location: [facility] OR;  Service: Orthopedics;  Laterality: N/A;   CORONARY PRESSURE/FFR STUDY N/A 03/18/2019   Procedure: INTRAVASCULAR PRESSURE WIRE/FFR STUDY;  Surgeon: [name], MD;  Location: [facility] INVASIVE CV LAB;  Service: Cardiovascular;  Laterality: N/A;   LEFT HEART CATH AND CORONARY ANGIOGRAPHY N/A 03/18/2019   Procedure: LEFT HEART CATH AND CORONARY ANGIOGRAPHY;  Surgeon:  [name], MD;  Location: [facility] CV [facility];  Service: Cardiovascular;  Laterality: N/A;   NO PAST SURGERIES     Family History  Problem Relation Age of Onset   Heart disease Mother    Heart failure Mother    Atrial fibrillation Mother    Kidney disease Father     Social History   Tobacco Use   Smoking status: Former    Packs/day: 1.00    Years: 34.00    Additional pack years: 0.00    Total pack years: 34.00    Types: Cigarettes    Quit date: 03/16/2020    Years since quitting: 2.1   Smokeless tobacco: Never   Tobacco comments:    Quit in feb of 2021  Substance Use Topics   Alcohol use: Not Currently    Alcohol/week: 12.0 standard drinks of alcohol    Types: 12 Cans of beer per week    Comment: Quit in Feb 2021   ROS  Review of Systems  Constitutional: Negative for malaise/fatigue and weight gain.  Cardiovascular:  Positive for dyspnea on exertion. Negative for chest pain, claudication, leg swelling, near-syncope, orthopnea, palpitations, paroxysmal nocturnal dyspnea and syncope.  Respiratory:  Positive for cough (chronic), shortness of breath and wheezing.   Musculoskeletal:  Positive for neck pain.  Neurological:  Negative for dizziness.   Objective  Blood pressure 109/82, pulse 65, height 5\' 9"  (1.753 m), weight 218 lb (98.9 kg), SpO2 98 %.     05/20/2022    8:49 AM 05/19/2022  10:07 AM 05/07/2022   10:51 AM  Vitals with BMI  Height  5\' 9"  5\' 9"   Weight  218 lbs 222 lbs  BMI  32.18 32.77  Systolic 128 109 161  Diastolic 84 82 81  Pulse 65 65 70    Physical Exam Neck:     Vascular: No JVD.  Cardiovascular:     Rate and Rhythm: Normal rate and regular rhythm.     Pulses: Normal pulses.     Heart sounds: Normal heart sounds.  Pulmonary:     Breath sounds: Examination of the right-upper field reveals decreased breath sounds and wheezing. Examination of the left-upper field reveals decreased breath sounds and wheezing. Examination of the right-middle field  reveals decreased breath sounds. Examination of the left-middle field reveals decreased breath sounds. Examination of the right-lower field reveals decreased breath sounds. Examination of the left-lower field reveals decreased breath sounds. Decreased breath sounds and wheezing present.  Musculoskeletal:     Right lower leg: No edema.     Left lower leg: No edema.  Neurological:     Mental Status: He is alert.    Laboratory examination:   Recent Labs    05/27/21 0951 08/09/21 1037 08/10/21 0541  NA 138 137 136  K 4.1 5.0 4.2  CL 102 103 103  CO2 26 24 28   GLUCOSE 190* 195* 171*  BUN 11 9 13   CREATININE 0.75 0.53* 0.55*  CALCIUM 9.5 9.3 8.4*  GFRNONAA >60 >60 >60    CrCl cannot be calculated (Patient's most recent lab result is older than the maximum 21 days allowed.).     Latest Ref Rng & Units 08/10/2021    5:41 AM 08/09/2021   10:37 AM 05/27/2021    9:51 AM  CMP  Glucose 70 - 99 mg/dL 096  045  409   BUN 6 - 20 mg/dL 13  9  11    Creatinine 0.61 - 1.24 mg/dL 8.11  9.14  7.82   Sodium 135 - 145 mmol/L 136  137  138   Potassium 3.5 - 5.1 mmol/L 4.2  5.0  4.1   Chloride 98 - 111 mmol/L 103  103  102   CO2 22 - 32 mmol/L 28  24  26    Calcium 8.9 - 10.3 mg/dL 8.4  9.3  9.5   Total Protein 6.5 - 8.1 g/dL 6.5  7.2    Total Bilirubin 0.3 - 1.2 mg/dL 0.5  0.6    Alkaline Phos 38 - 126 U/L 40  49    AST 15 - 41 U/L 20  22    ALT 0 - 44 U/L 23  24        Latest Ref Rng & Units 05/07/2022   11:36 AM 08/10/2021    5:41 AM 08/09/2021   10:37 AM  CBC  WBC 3.4 - 10.8 x10E3/uL 8.7  9.5  12.8   Hemoglobin 13.0 - 17.7 g/dL 95.6  21.3  08.6   Hematocrit 37.5 - 51.0 % 47.9  37.9  41.5   Platelets 150 - 450 x10E3/uL 423  371  433    Lipid Panel     Component Value Date/Time   CHOL 154 12/21/2019 0441   TRIG 83 12/21/2019 0441   HDL 54 12/21/2019 0441   CHOLHDL 2.9 12/21/2019 0441   VLDL 17 12/21/2019 0441   LDLCALC 83 12/21/2019 0441   HEMOGLOBIN A1C Lab Results  Component  Value Date   HGBA1C 9.4 (H) 05/27/2021   MPG  223.08 05/27/2021   TSH Recent Labs    08/09/21 1500  TSH 1.376    Allergies   Allergies  Allergen Reactions   Asa [Aspirin] Hives   Zolmitriptan Nausea And Vomiting and Other (See Comments)    Severe headaches    Medications Prior to Visit:   Outpatient Medications Prior to Visit  Medication Sig Dispense Refill   albuterol (VENTOLIN HFA) 108 (90 Base) MCG/ACT inhaler Inhale 1-2 puffs into the lungs every 6 (six) hours as needed for wheezing or shortness of breath.     clopidogrel (PLAVIX) 75 MG tablet Take 75 mg by mouth daily.     famotidine (PEPCID) 20 MG tablet One after supper 30 tablet 11   FARXIGA 10 MG TABS tablet Take 10 mg by mouth daily.     folic acid (FOLVITE) 1 MG tablet Take 1 tablet (1 mg total) by mouth daily. 30 tablet 2   insulin NPH-regular Human (NOVOLIN 70/30) (70-30) 100 UNIT/ML injection Inject 50 Units into the skin 2 (two) times daily with a meal. 10 mL 1   ipratropium-albuterol (DUONEB) 0.5-2.5 (3) MG/3ML SOLN Inhale 3 mLs into the lungs every 6 (six) hours as needed (shortness of breath).     losartan (COZAAR) 50 MG tablet Take 50 mg by mouth daily.     metFORMIN (GLUCOPHAGE) 1000 MG tablet Take 1 tablet (1,000 mg total) by mouth 2 (two) times daily with a meal. 60 tablet 2   metoprolol succinate (TOPROL-XL) 25 MG 24 hr tablet Take 1 tablet (25 mg total) by mouth daily. (Patient taking differently: Take 12.5 mg by mouth daily.) 30 tablet 2   montelukast (SINGULAIR) 10 MG tablet Take 1 tablet (10 mg total) by mouth at bedtime. 30 tablet 1   OZEMPIC, 1 MG/DOSE, 4 MG/3ML SOPN Inject 1 mg into the skin once a week.     pantoprazole (PROTONIX) 40 MG tablet Take 1 tablet (40 mg total) by mouth daily. Take 30-60 min before first meal of the day 30 tablet 2   pregabalin (LYRICA) 300 MG capsule Take 300 mg by mouth 2 (two) times daily.     rosuvastatin (CRESTOR) 20 MG tablet Take 20 mg by mouth daily.     thiamine  100 MG tablet Take 2.5 tablets (250 mg total) by mouth daily. (Patient taking differently: Take 100 mg by mouth daily.) 30 tablet 2   amoxicillin-clavulanate (AUGMENTIN) 875-125 MG tablet Take 1 tablet by mouth 2 (two) times daily. 20 tablet 0   predniSONE (DELTASONE) 10 MG tablet Take  4 each am x 2 days,   2 each am x 2 days,  1 each am x 2 days and stop (Patient not taking: Reported on 05/19/2022) 14 tablet 0   No facility-administered medications prior to visit.   Final Medications at End of Visit    Current Meds  Medication Sig   albuterol (VENTOLIN HFA) 108 (90 Base) MCG/ACT inhaler Inhale 1-2 puffs into the lungs every 6 (six) hours as needed for wheezing or shortness of breath.   clopidogrel (PLAVIX) 75 MG tablet Take 75 mg by mouth daily.   famotidine (PEPCID) 20 MG tablet One after supper   FARXIGA 10 MG TABS tablet Take 10 mg by mouth daily.   folic acid (FOLVITE) 1 MG tablet Take 1 tablet (1 mg total) by mouth daily.   insulin NPH-regular Human (NOVOLIN 70/30) (70-30) 100 UNIT/ML injection Inject 50 Units into the skin 2 (two) times daily with a meal.   ipratropium-albuterol (  DUONEB) 0.5-2.5 (3) MG/3ML SOLN Inhale 3 mLs into the lungs every 6 (six) hours as needed (shortness of breath).   losartan (COZAAR) 50 MG tablet Take 50 mg by mouth daily.   metFORMIN (GLUCOPHAGE) 1000 MG tablet Take 1 tablet (1,000 mg total) by mouth 2 (two) times daily with a meal.   metoprolol succinate (TOPROL-XL) 25 MG 24 hr tablet Take 1 tablet (25 mg total) by mouth daily. (Patient taking differently: Take 12.5 mg by mouth daily.)   montelukast (SINGULAIR) 10 MG tablet Take 1 tablet (10 mg total) by mouth at bedtime.   OZEMPIC, 1 MG/DOSE, 4 MG/3ML SOPN Inject 1 mg into the skin once a week.   pantoprazole (PROTONIX) 40 MG tablet Take 1 tablet (40 mg total) by mouth daily. Take 30-60 min before first meal of the day   pregabalin (LYRICA) 300 MG capsule Take 300 mg by mouth 2 (two) times daily.    rosuvastatin (CRESTOR) 20 MG tablet Take 20 mg by mouth daily.   thiamine 100 MG tablet Take 2.5 tablets (250 mg total) by mouth daily. (Patient taking differently: Take 100 mg by mouth daily.)   [DISCONTINUED] amoxicillin-clavulanate (AUGMENTIN) 875-125 MG tablet Take 1 tablet by mouth 2 (two) times daily.   Radiology:   No results found.  Cardiac Studies:  PCV MYOCARDIAL PERFUSION WITH LEXISCAN 05/01/2021 Lexiscan/modified Bruce nuclear stress test performed using 1-day protocol. In addition, patient achieved 2.2 METS and reached 82% MPHR. Normal myocardial perfusion. Stress LVEF 78%. Low risk study.  PCV ECHOCARDIOGRAM COMPLETE 04/24/2021 Normal LV systolic function with visual EF 60-65%. Left ventricle cavity is normal in size. Normal left ventricular wall thickness. Normal global wall motion. Normal diastolic filling pattern, normal LAP. No significant valvular heart disease. No prior study for comparison.   Left Heart Catheterization 03/18/19:  LV normal LVEF grossly. EDP mardekly elevated Right coronary artery nondominant small, normal. Left main large, normal. Ramus intermediate large, normal. Circumflex large, dominant, normal. LAD ostial 50% stenosis.  FFR 0.89, not hemodynamically significant.   Impression: Patient developed significant respiratory distress probably combination of acute COPD exacerbation and also patient received intravenous adenosine for FFR evaluation.  Received albuterol inhalation and also oxygen supplementation with stabilization immediately.  Respiratory distress could also be related to combination of acute diastolic heart failure with markedly elevated EDP.  Patient also received IV Lasix.   Recommendation: No significant coronary disease, needs primary prevention, consider addition of Plavix as patient is allergic to aspirin in view of moderate coronary artery disease involving the ostial LAD.  Primary prevention with aggressive control of diabetes,  hypertension and hyperlipidemia is indicated.  Suspect is cardiac arrest was secondary to underlying COPD with respiratory distress and severe hypoxemia as evident by similar presentation in the cardiac catheterization lab where his sats dropped down to 70s.  Smoking cessation is indicated as well.  We will follow up on the echocardiogram.   1. Left ventricular ejection fraction, by estimation, is 55 to 60%. The  left ventricle has normal function. The left ventricle has no regional  wall motion abnormalities. Left ventricular diastolic parameters were  normal.   2. Right ventricular systolic function is normal. The right ventricular  size is normal.   3. The inferior vena cava is dilated in size with >50% respiratory  variability, suggesting right atrial pressure of 8 mmHg.   EKG   EKG 11/11/21: Sinus rhythm at a rate of 70 bpm.  Left axis, left anterior fascicular block.   No evidence of  ischemia or underlying injury pattern. Compared EKG on 05/09/21, no significant change.  Assessment     ICD-10-CM   1. Coronary artery disease involving native coronary artery of native heart without angina pectoris  I25.10 EKG 12-Lead    2. Essential hypertension  I10     3. Dyspnea on exertion  R06.09     4. Mixed hyperlipidemia  E78.2        No orders of the defined types were placed in this encounter.   Medications Discontinued During This Encounter  Medication Reason   predniSONE (DELTASONE) 10 MG tablet Completed Course     Recommendations:   Coronary artery disease involving native coronary artery of native heart without angina pectoris No recurrence of chest pain. He continues of guideline directed therapy.  Primary hypertension Blood pressure is well controlled Encouraged low sodium diet less than 2000mg  daily and incorporating an exercise regimen. He is agreeable to this. No changes to medications.  Mixed hyperlipidemia Continues on rosuvastatin 20mg  daily without  myalgias. PCP following lipids  DOE/SOB He may have lung cancer, 18 nodules were found and he recently had biopsy He will find our results of biopsy this Thursday Follow-up in 6 months, sooner if needed.    Clotilde Dieter, DO 05/20/2022, 11:25 AM Office: [phone]

## 2022-05-20 ENCOUNTER — Telehealth: Payer: Self-pay | Admitting: *Deleted

## 2022-05-20 ENCOUNTER — Ambulatory Visit (INDEPENDENT_AMBULATORY_CARE_PROVIDER_SITE_OTHER): Payer: Medicaid Other | Admitting: Physician Assistant

## 2022-05-20 VITALS — BP 128/84 | HR 65

## 2022-05-20 DIAGNOSIS — L905 Scar conditions and fibrosis of skin: Secondary | ICD-10-CM

## 2022-05-20 MED ORDER — OXYCODONE-ACETAMINOPHEN 5-325 MG PO TABS: 1.0000 | ORAL_TABLET | Freq: Four times a day (QID) | ORAL | 0 refills | Status: DC | PRN

## 2022-05-20 MED ORDER — CEPHALEXIN 500 MG PO CAPS
500.0000 mg | ORAL_CAPSULE | Freq: Three times a day (TID) | ORAL | 0 refills | Status: DC
Start: 2022-05-20 — End: 2022-05-23

## 2022-05-20 NOTE — H&P (View-Only) (Signed)
   Patient ID: Robert Rich, male    DOB: 03/19/1965, 57 y.o.   MRN: [id number]  Chief Complaint  Patient presents with   Pre-op Exam    No diagnosis found.   History of Present Illness: Robert Rich is a 57 y.o.  male  with a history of neck skin hypertrophic contracture.  He presents for preoperative evaluation for upcoming procedure, excision of neck skin hypertrophic contracture with myriad placement, scheduled for 06/05/2022 with Dr. Dillingham.  The patient has not had problems with anesthesia.   Summary of Previous Visit: Patient was originally seen in the office on 01/03/2022.  Below is a recap of that visit.  The patient is a very nice 56-year-old gentleman here for evaluation of his neck. He underwent a cervical spine surgery with Dr. Brooks approximately 6 months ago. An anterior approach was utilized. He developed a scar contracture pretty quickly. He has been massaging the area and using Ortho oil. It seems to be getting tighter instead of loose. He states that it actually inhibits him from fully extending his neck. He has a history of COPD, diabetes, heart attack, hyperlipidemia, hypertension, stroke and obesity. He has lost significant amount of weight. He has noticed a difference with the weight loss and his joint pain. He has not had any Kenalog injections to the scar contracture   Since that visit he has returned several times for Kenalog injections which have not significantly changed his symptoms.  The patient notes that he is a diabetic, his A1c was previously 11 now is down to 7.  He notes he is on Plavix that is managed by his cardiologist Dr. Ganji.  He notes he previously held this for 3 days prior to his cervical fusion.  He was seen by Dr. Gondi last week and was told he is okay to proceed with surgery but was not given any specific recommendations for the Plavix use.  The patient denies any history of cancer, no history of DVT or PE, he does note a history of MI and  stroke.  Job: Retired mechanic  PMH Significant for: Diabetes, hypertension, stroke, MI   Past Medical History: Allergies: Allergies  Allergen Reactions   Asa [Aspirin] Hives   Zolmitriptan Nausea And Vomiting and Other (See Comments)    Severe headaches    Current Medications:  Current Outpatient Medications:    albuterol (VENTOLIN HFA) 108 (90 Base) MCG/ACT inhaler, Inhale 1-2 puffs into the lungs every 6 (six) hours as needed for wheezing or shortness of breath., Disp: , Rfl:    clopidogrel (PLAVIX) 75 MG tablet, Take 75 mg by mouth daily., Disp: , Rfl:    famotidine (PEPCID) 20 MG tablet, One after supper, Disp: 30 tablet, Rfl: 11   FARXIGA 10 MG TABS tablet, Take 10 mg by mouth daily., Disp: , Rfl:    folic acid (FOLVITE) 1 MG tablet, Take 1 tablet (1 mg total) by mouth daily., Disp: 30 tablet, Rfl: 2   insulin NPH-regular Human (NOVOLIN 70/30) (70-30) 100 UNIT/ML injection, Inject 50 Units into the skin 2 (two) times daily with a meal., Disp: 10 mL, Rfl: 1   ipratropium-albuterol (DUONEB) 0.5-2.5 (3) MG/3ML SOLN, Inhale 3 mLs into the lungs every 6 (six) hours as needed (shortness of breath)., Disp: , Rfl:    losartan (COZAAR) 50 MG tablet, Take 50 mg by mouth daily., Disp: , Rfl:    metFORMIN (GLUCOPHAGE) 1000 MG tablet, Take 1 tablet (1,000 mg total) by mouth 2 (  two) times daily with a meal., Disp: 60 tablet, Rfl: 2   metoprolol succinate (TOPROL-XL) 25 MG 24 hr tablet, Take 1 tablet (25 mg total) by mouth daily. (Patient taking differently: Take 12.5 mg by mouth daily.), Disp: 30 tablet, Rfl: 2   montelukast (SINGULAIR) 10 MG tablet, Take 1 tablet (10 mg total) by mouth at bedtime., Disp: 30 tablet, Rfl: 1   OZEMPIC, 1 MG/DOSE, 4 MG/3ML SOPN, Inject 1 mg into the skin once a week., Disp: , Rfl:    pantoprazole (PROTONIX) 40 MG tablet, Take 1 tablet (40 mg total) by mouth daily. Take 30-60 min before first meal of the day, Disp: 30 tablet, Rfl: 2   pregabalin (LYRICA) 300 MG  capsule, Take 300 mg by mouth 2 (two) times daily., Disp: , Rfl:    rosuvastatin (CRESTOR) 20 MG tablet, Take 20 mg by mouth daily., Disp: , Rfl:    thiamine 100 MG tablet, Take 2.5 tablets (250 mg total) by mouth daily. (Patient taking differently: Take 100 mg by mouth daily.), Disp: 30 tablet, Rfl: 2  Past Medical Problems: Past Medical History:  Diagnosis Date   COPD (chronic obstructive pulmonary disease)    Coronary artery disease    DM (diabetes mellitus)    Dyspnea    Heart attack    HLD (hyperlipidemia)    HTN (hypertension)    Obesity    Stroke     Past Surgical History: Past Surgical History:  Procedure Laterality Date   ANTERIOR CERVICAL DECOMPRESSION/DISCECTOMY FUSION 4 LEVELS N/A 06/10/2021   Procedure: Anterior cervical discectomy and fusion Cervical four to seven;  Surgeon: Brooks, Dahari, MD;  Location: [facility] OR;  Service: Orthopedics;  Laterality: N/A;   CORONARY PRESSURE/FFR STUDY N/A 03/18/2019   Procedure: INTRAVASCULAR PRESSURE WIRE/FFR STUDY;  Surgeon: Ganji, Jay, MD;  Location: [facility];  Service: Cardiovascular;  Laterality: N/A;   LEFT HEART CATH AND CORONARY ANGIOGRAPHY N/A 03/18/2019   Procedure: LEFT HEART CATH AND CORONARY ANGIOGRAPHY;  Surgeon: Ganji, Jay, MD;  Location: [facility];  Service: Cardiovascular;  Laterality: N/A;   NO PAST SURGERIES      Social History: Social History   Socioeconomic History   Marital status: Legally Separated    Spouse name: Not on file   Number of children: 1   Years of education: Not on file   Highest education level: Not on file  Occupational History   Not on file  Tobacco Use   Smoking status: Former    Packs/day: 1.00    Years: 34.00    Additional pack years: 0.00    Total pack years: 34.00    Types: Cigarettes    Quit date: 03/16/2020    Years since quitting: 2.1   Smokeless tobacco: Never   Tobacco comments:    Quit in feb of 2021  Vaping Use   Vaping Use: Never used  Substance and  Sexual Activity   Alcohol use: Not Currently    Alcohol/week: 12.0 standard drinks of alcohol    Types: 12 Cans of beer per week    Comment: Quit in Feb 2021   Drug use: Not Currently   Sexual activity: Yes  Other Topics Concern   Not on file  Social History Narrative   Not on file   Social Determinants of Health   Financial Resource Strain: Not on file  Food Insecurity: Not on file  Transportation Needs: Not on file  Physical Activity: Not on file  Stress: Not on   file  Social Connections: Not on file  Intimate Partner Violence: Not on file    Family History: Family History  Problem Relation Age of Onset   Heart disease Mother    Heart failure Mother    Atrial fibrillation Mother    Kidney disease Father     Review of Systems: ROS Scar contracture  Physical Exam: Vital Signs BP 128/84 (BP Location: Left Arm, Patient Position: Sitting, Cuff Size: Small)   Pulse 65   SpO2 99%   Physical Exam  Constitutional:      General: Not in acute distress.    Appearance: Normal appearance. Not ill-appearing.  HENT:     Head: Normocephalic and atraumatic.  Scar contracture of the neck Eyes:     Pupils: Pupils are equal, round. Cardiovascular:     Rate and Rhythm: Normal rate. Pulmonary:     Effort: No respiratory distress or increased work of breathing.  Speaks in full sentences. Musculoskeletal: Normal range of motion. No lower extremity swelling or edema. No varicosities.  Skin:    General: Skin is warm and dry.     Findings: No erythema or rash.  Neurological:     Mental Status: Alert and oriented to person, place, and time.  Psychiatric:        Mood and Affect: Mood normal.        Behavior: Behavior normal.    Assessment/Plan: The patient is scheduled for excision of neck skin hypertrophic contracture with myriad placement with Dr. Dillingham.  Risks, benefits, and alternatives of procedure discussed, questions answered and consent obtained.    Smoking Status:  Non-smoker  Caprini Score: 6; Risk Factors include: Age, BMI, COPD, length of surgery; Recommendation is early ambulation  Pictures obtained: Previous visit   Post-op Rx sent to pharmacy: keflex  Patient was provided with the  General Surgical Risk consent document and Pain Medication Agreement prior to their appointment.  They had adequate time to read through the risk consent documents and Pain Medication Agreement. We also discussed them in person together during this preop appointment. All of their questions were answered to their satisfaction.  Recommended calling if they have any further questions.  Risk consent form and Pain Medication Agreement to be scanned into patient's chart.  The patient is on Plavix, we will reach out to Dr. Gongi's office for risk stratification assessment and recommendations on holding Plavix.    Electronically signed by: Lamarr Feenstra Todd Daziya Redmond, PA-C 05/20/2022 9:40 AM  

## 2022-05-20 NOTE — Telephone Encounter (Signed)
Request for surgical clearance and recommendations for holding plavix pre and post-op sent to Dr. Clotilde Dieter with confirmation received. Fax # [phone]

## 2022-05-20 NOTE — Progress Notes (Signed)
Patient ID: Robert Rich, male    DOB: 08-17-1965, 57 y.o.   MRN: [id number]  Chief Complaint  Patient presents with   Pre-op Exam    No diagnosis found.   History of Present Illness: Robert Rich is a 57 y.o.  male  with a history of neck skin hypertrophic contracture.  He presents for preoperative evaluation for upcoming procedure, excision of neck skin hypertrophic contracture with myriad placement, scheduled for 06/05/2022 with Dr. Ulice Bold.  The patient has not had problems with anesthesia.   Summary of Previous Visit: Patient was originally seen in the office on 01/03/2022.  Below is a recap of that visit.  The patient is a very nice 57 year old gentleman here for evaluation of his neck. He underwent a cervical spine surgery with Dr. Shon Baton approximately 6 months ago. An anterior approach was utilized. He developed a scar contracture pretty quickly. He has been massaging the area and using Ortho oil. It seems to be getting tighter instead of loose. He states that it actually inhibits him from fully extending his neck. He has a history of COPD, diabetes, heart attack, hyperlipidemia, hypertension, stroke and obesity. He has lost significant amount of weight. He has noticed a difference with the weight loss and his joint pain. He has not had any Kenalog injections to the scar contracture   Since that visit he has returned several times for Kenalog injections which have not significantly changed his symptoms.  The patient notes that he is a diabetic, his A1c was previously 11 now is down to 7.  He notes he is on Plavix that is managed by his cardiologist Dr. Jacinto Halim.  He notes he previously held this for 3 days prior to his cervical fusion.  He was seen by Dr. Gwynneth Macleod last week and was told he is okay to proceed with surgery but was not given any specific recommendations for the Plavix use.  The patient denies any history of cancer, no history of DVT or PE, he does note a history of MI and  stroke.  Job: Retired Curator  PMH Significant for: Diabetes, hypertension, stroke, MI   Past Medical History: Allergies: Allergies  Allergen Reactions   Asa [Aspirin] Hives   Zolmitriptan Nausea And Vomiting and Other (See Comments)    Severe headaches    Current Medications:  Current Outpatient Medications:    albuterol (VENTOLIN HFA) 108 (90 Base) MCG/ACT inhaler, Inhale 1-2 puffs into the lungs every 6 (six) hours as needed for wheezing or shortness of breath., Disp: , Rfl:    clopidogrel (PLAVIX) 75 MG tablet, Take 75 mg by mouth daily., Disp: , Rfl:    famotidine (PEPCID) 20 MG tablet, One after supper, Disp: 30 tablet, Rfl: 11   FARXIGA 10 MG TABS tablet, Take 10 mg by mouth daily., Disp: , Rfl:    folic acid (FOLVITE) 1 MG tablet, Take 1 tablet (1 mg total) by mouth daily., Disp: 30 tablet, Rfl: 2   insulin NPH-regular Human (NOVOLIN 70/30) (70-30) 100 UNIT/ML injection, Inject 50 Units into the skin 2 (two) times daily with a meal., Disp: 10 mL, Rfl: 1   ipratropium-albuterol (DUONEB) 0.5-2.5 (3) MG/3ML SOLN, Inhale 3 mLs into the lungs every 6 (six) hours as needed (shortness of breath)., Disp: , Rfl:    losartan (COZAAR) 50 MG tablet, Take 50 mg by mouth daily., Disp: , Rfl:    metFORMIN (GLUCOPHAGE) 1000 MG tablet, Take 1 tablet (1,000 mg total) by mouth 2 (  two) times daily with a meal., Disp: 60 tablet, Rfl: 2   metoprolol succinate (TOPROL-XL) 25 MG 24 hr tablet, Take 1 tablet (25 mg total) by mouth daily. (Patient taking differently: Take 12.5 mg by mouth daily.), Disp: 30 tablet, Rfl: 2   montelukast (SINGULAIR) 10 MG tablet, Take 1 tablet (10 mg total) by mouth at bedtime., Disp: 30 tablet, Rfl: 1   OZEMPIC, 1 MG/DOSE, 4 MG/3ML SOPN, Inject 1 mg into the skin once a week., Disp: , Rfl:    pantoprazole (PROTONIX) 40 MG tablet, Take 1 tablet (40 mg total) by mouth daily. Take 30-60 min before first meal of the day, Disp: 30 tablet, Rfl: 2   pregabalin (LYRICA) 300 MG  capsule, Take 300 mg by mouth 2 (two) times daily., Disp: , Rfl:    rosuvastatin (CRESTOR) 20 MG tablet, Take 20 mg by mouth daily., Disp: , Rfl:    thiamine 100 MG tablet, Take 2.5 tablets (250 mg total) by mouth daily. (Patient taking differently: Take 100 mg by mouth daily.), Disp: 30 tablet, Rfl: 2  Past Medical Problems: Past Medical History:  Diagnosis Date   COPD (chronic obstructive pulmonary disease)    Coronary artery disease    DM (diabetes mellitus)    Dyspnea    Heart attack    HLD (hyperlipidemia)    HTN (hypertension)    Obesity    Stroke     Past Surgical History: Past Surgical History:  Procedure Laterality Date   ANTERIOR CERVICAL DECOMPRESSION/DISCECTOMY FUSION 4 LEVELS N/A 06/10/2021   Procedure: Anterior cervical discectomy and fusion Cervical four to seven;  Surgeon: Venita Lick, MD;  Location: [facility] OR;  Service: Orthopedics;  Laterality: N/A;   CORONARY PRESSURE/FFR STUDY N/A 03/18/2019   Procedure: INTRAVASCULAR PRESSURE WIRE/FFR STUDY;  Surgeon: [name], MD;  Location: [facility] INVASIVE CV LAB;  Service: Cardiovascular;  Laterality: N/A;   LEFT HEART CATH AND CORONARY ANGIOGRAPHY N/A 03/18/2019   Procedure: LEFT HEART CATH AND CORONARY ANGIOGRAPHY;  Surgeon: [name], MD;  Location: [facility] INVASIVE CV LAB;  Service: Cardiovascular;  Laterality: N/A;   NO PAST SURGERIES      Social History: Social History   Socioeconomic History   Marital status: Legally Separated    Spouse name: Not on file   Number of children: 1   Years of education: Not on file   Highest education level: Not on file  Occupational History   Not on file  Tobacco Use   Smoking status: Former    Packs/day: 1.00    Years: 34.00    Additional pack years: 0.00    Total pack years: 34.00    Types: Cigarettes    Quit date: 03/16/2020    Years since quitting: 2.1   Smokeless tobacco: Never   Tobacco comments:    Quit in feb of 2021  Vaping Use   Vaping Use: Never used  Substance and  Sexual Activity   Alcohol use: Not Currently    Alcohol/week: 12.0 standard drinks of alcohol    Types: 12 Cans of beer per week    Comment: Quit in Feb 2021   Drug use: Not Currently   Sexual activity: Yes  Other Topics Concern   Not on file  Social History Narrative   Not on file   Social Determinants of Health   Financial Resource Strain: Not on file  Food Insecurity: Not on file  Transportation Needs: Not on file  Physical Activity: Not on file  Stress: Not on  file  Social Connections: Not on file  Intimate Partner Violence: Not on file    Family History: Family History  Problem Relation Age of Onset   Heart disease Mother    Heart failure Mother    Atrial fibrillation Mother    Kidney disease Father     Review of Systems: ROS Scar contracture  Physical Exam: Vital Signs BP 128/84 (BP Location: Left Arm, Patient Position: Sitting, Cuff Size: Small)   Pulse 65   SpO2 99%   Physical Exam  Constitutional:      General: Not in acute distress.    Appearance: Normal appearance. Not ill-appearing.  HENT:     Head: Normocephalic and atraumatic.  Scar contracture of the neck Eyes:     Pupils: Pupils are equal, round. Cardiovascular:     Rate and Rhythm: Normal rate. Pulmonary:     Effort: No respiratory distress or increased work of breathing.  Speaks in full sentences. Musculoskeletal: Normal range of motion. No lower extremity swelling or edema. No varicosities.  Skin:    General: Skin is warm and dry.     Findings: No erythema or rash.  Neurological:     Mental Status: Alert and oriented to person, place, and time.  Psychiatric:        Mood and Affect: Mood normal.        Behavior: Behavior normal.    Assessment/Plan: The patient is scheduled for excision of neck skin hypertrophic contracture with myriad placement with Dr. Ulice Bold.  Risks, benefits, and alternatives of procedure discussed, questions answered and consent obtained.    Smoking Status:  Non-smoker  Caprini Score: 6; Risk Factors include: Age, BMI, COPD, length of surgery; Recommendation is early ambulation  Pictures obtained: Previous visit   Post-op Rx sent to pharmacy: keflex  Patient was provided with the  General Surgical Risk consent document and Pain Medication Agreement prior to their appointment.  They had adequate time to read through the risk consent documents and Pain Medication Agreement. We also discussed them in person together during this preop appointment. All of their questions were answered to their satisfaction.  Recommended calling if they have any further questions.  Risk consent form and Pain Medication Agreement to be scanned into patient's chart.  The patient is on Plavix, we will reach out to Dr. Payton Mccallum office for risk stratification assessment and recommendations on holding Plavix.    Electronically signed by: Kelle Darting Rashea Hoskie, PA-C 05/20/2022 9:40 AM

## 2022-05-21 ENCOUNTER — Telehealth: Payer: Self-pay | Admitting: Physician Assistant

## 2022-05-21 NOTE — Telephone Encounter (Signed)
Cardiac clearance received from [facility] DO, okay to hold Plavix 3 to 5 days prior to surgery and restart 3 days after.  I did call the patient with these recommendations.  He had no further questions or concerns.

## 2022-05-21 NOTE — Telephone Encounter (Signed)
Surgical clearance received with instructions for pt to hold plavix 3-5 days before surgery and restart 3 days after. Sent to batch scan. Copy given to [employer], PA-C.

## 2022-05-21 NOTE — Progress Notes (Signed)
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 male former smoker with medical history significant for severe COPD, coronary artery disease, type 2 diabetes mellitus, dyslipidemia, alcohol use disorder and substance abuse with benzodiazepines and history of prior cocaine use was recently hospitalized discharged 2 weeks ago for a COPD exacerbation.  He reports that he felt well after going home but when he ran out of steroids and antibiotics his symptoms slowly began to return over the course of the last 3 days.  He became so severely short of breath today that he called EMS.  He says that he had return to work last week and was working short hours up until 3 days ago when he had increased his work hours and noticed that his shortness of breath became acutely worse.  He reports significant wheezing and increased work of breathing coughing and chest discomfort.  He denies chest pain.  He reports productive cough with yellow sputum.  He can only walk a few feet without severe shortness of breath.   Discharge Diagnoses:    Acute on chronic respiratory failure with hypoxia  -secondary to severe COPD exacerbation  --symptoms rapidly returned after he completed his steroid taper and antibiotics from recent hospitalization. -He has an outpatient pulmonary appt later this month but I requested inpatient consult given multiple admissions and ED visits.  -appreciate pulmonary follow up-->wean prednisone over 2 weeks   COPD Exacerbation -continue duonebs -de-escalate to po prednisone  - Appreciate pulmonary consult.  Discussed with Dr. Halina Maidens prednisone over next 2 weeks    -Unfortunately patient could not tolerate brovana neb treatments.   -Continue Duonebs, continue pulmicort nebs  -Pulmonary willl see him again on outpatient follow up 08/24/19.   -They will arrange PFTs.  - He was given lasix 20 mg IV daily x 4 total doses during the hospitalization.     CAD  - stable, no chest pain symptoms, resumed his home heart medications.    Tobacco abuse - He reports he quit several months ago.  Polysubstance abuse  - UDS positive for benzo, opioid and barbituates.   Anxiety - alprazolam as needed only for severe symptoms.  Type 2 DM with steroid induced hyperglycemia  - increased doses of insulin and follow closely especially while on IV steroids.    -Pt will need to DC home on insulin.  He has used insulin before.  He prefers vials and syringes.  He will be on steroids for at least 2 weeks after discharge and will need insulin plus metformin to help control his steroid induced hyperglycemia -d/c home with novolin 70/30--50 units bid -instructed pt to continue checking CBGs ac/hs      overall 50% improved at d/c on prednisone tapered off 08/20/19 and about the same as at d/c but no longer using neb / maint on Anoro      History of Present Illness  08/24/2019  Pulmonary/ 1st Rich eval/ Robert Rich / Robert Rich  Chief Complaint  Patient presents with   Follow-up    shortness of breath with exertion  Dyspnea:  MMRC3 = can't walk  100 yards even at a slow pace at a flat grade s stopping due to sob   Cough: resolved  Sleep: flat  SABA use: combivent and anoro both in am then no other saba  rec Plan A = Automatic = Always=   Anoro one click take two drags Plan B = Backup (to supplement plan A, not to replace it) Only use your albuterol inhaler as a rescue medication Try albuterol 15 min before an activity that you know would make you short of breath     Plan C = Crisis (instead of Plan B but only if Plan B stops working) - only use your albuterol  nebulizer if you first try Plan B  Make sure you check your oxygen saturations at highest level of activity  Please schedule a follow up visit in 3 months with PFTs  but call sooner if needed  - ok to delay if you haven't got your insurance.    03/28/2022  f/u ov/Robert Rich/Robert Rich re: AB exac x 6 weeks  maint on anoro   Chief Complaint  Patient presents with   Acute Visit    Acute- New SOB and cough with green mucus   Dyspnea:  still doing track 12 min  x 4 days per week  Cough: assoc with nasal congestion and nasty drainage flared twice in last 6 weeks prior to OV   Sleeping:flat bed 2 pillows under head  SABA use: twice a month hfa/ every 6 hours x 2 weeks  02: none  Covid status: vax x 3 / infection 2023  Lung cancer screening: referred today Rec My Rich will be contacting you by phone for referral for  lung cancer screening    Stop anoro and start trelegy 100 each am  Work on inhaler technique:    Please remember to go to the lab department   > did not go       05/07/2022  f/u ov/Robert Rich/Robert Rich re: AB  maint on Trelegy   and steadily worse Chief Complaint  Patient presents with   Follow-up    Pt f/u states that he is having coughing/wheezing w/ phlegm ranging from bloody, green, and brownish. Onset was 03/30/2022  Dyspnea:  ok if not coughing  Cough: variably nasty from nose and chest day > noct  Sleeping: sleeps ok flat 2 pillows  SABA use: none not hfa or neb  02: none  Lung cancer screening: every march  Rec Stop trelegy  Augmentin 875 mg take one pill twice daily  X 10 days For cough > mucinex dm 1200 mg (otc) every 12 hours and supplement with tylenol #3 one every 4 hours as needed with goal of no coughing at all  Prednisone 10 mg take  4 each am x 2 days,   2 each am x 2 days,  1 each am x 2 days and stop  Duoneb up to 4 x daily if needed for breathing  Pantoprazole (protonix) 40 mg   Take  30-60 min before first meal of the day and Pepcid (famotidine)   20 mg after supper until return to Rich  GERD diet reviewed, bed blocks rec   Please schedule a follow up Rich visit in 2 weeks, sooner if needed  with all medications /inhalers/ solutions in hand    05/07/2022  :  allergy screen Eos 0.3/IGE 256  alpha one AT phenotype  MM  level 147   05/23/2022  f/u ov/ Rich/Robert Rich re: AB maint back on  trelegy 100  Chief Complaint  Patient presents with   Follow-up    Pt f/u states that he stopped the trelegy like Dr.Lorn Butcher directed @ [facility] but 2 days ago he began feeling like he was suffocating. He states that he restarted it and feels better the only issue currently bothering him is the pollen and sinus drainage.   Dyspnea:  only if does push mower  Cough: better but starting to have nasal congestion again with pollen exposure Sleeping: flat bed 2 pillows  SABA use: once a day hfa/ neb gives him ha  02: none      No obvious day to day or daytime variability or assoc excess/ purulent sputum or mucus plugs or hemoptysis or cp or chest tightness, subjective wheeze or overt hb symptoms.   Sleeping now  without nocturnal  or early am exacerbation  of respiratory  c/o's or need for noct saba. Also denies any obvious fluctuation of symptoms with weather or environmental changes or other aggravating or alleviating factors except as outlined above   No unusual exposure hx or h/o childhood pna/ asthma or knowledge of premature birth.  Current Allergies, Complete Past Medical History, Past Surgical History, Family History, and Social History were reviewed in [facility] record.  ROS  The following are not active complaints unless bolded Hoarseness, sore throat due to pnds, dysphagia, dental problems, itching, sneezing,  nasal congestion or discharge of excess mucus or purulent secretions, ear ache,   fever, chills, sweats, unintended wt loss or wt gain, classically pleuritic or exertional cp,  orthopnea pnd or arm/hand swelling  or  leg swelling, presyncope, palpitations, abdominal pain, anorexia, nausea, vomiting, diarrhea  or change in bowel habits or change in bladder habits, change in stools or change in urine, dysuria, hematuria,  rash, arthralgias, visual complaints, headache, numbness, weakness or ataxia or problems with walking or coordination,  change in mood or  memory.        Current Meds  Medication Sig   albuterol (VENTOLIN HFA) 108 (90 Base) MCG/ACT inhaler Inhale 1-2 puffs into the lungs every 6 (six) hours as needed for wheezing or shortness of breath.   clopidogrel (PLAVIX) 75 MG tablet Take 75 mg by mouth daily.   famotidine (PEPCID) 20 MG tablet One after supper   FARXIGA 10 MG TABS tablet Take 10 mg by mouth daily.   Fluticasone-Umeclidin-Vilant (TRELEGY ELLIPTA) 100-62.5-25 MCG/ACT AEPB Inhale 1 puff into the lungs daily.   folic acid (FOLVITE) 1 MG tablet Take 1 tablet (1 mg total) by mouth daily.   insulin NPH-regular Human (NOVOLIN 70/30) (70-30) 100 UNIT/ML injection Inject 50 Units into the skin 2 (two) times daily with a meal.   ipratropium-albuterol (DUONEB) 0.5-2.5 (3) MG/3ML SOLN Inhale 3 mLs into the lungs every 6 (six) hours as needed (shortness of breath).   losartan (COZAAR) 50 MG tablet Take 50 mg by mouth daily.   metFORMIN (GLUCOPHAGE) 1000 MG tablet Take 1 tablet (1,000 mg total) by mouth 2 (two) times daily with a meal.   metoprolol succinate (TOPROL-XL) 25 MG 24 hr tablet Take 1 tablet (25 mg total) by mouth daily. (Patient taking differently: Take 12.5 mg by mouth daily.)   montelukast (SINGULAIR) 10 MG tablet Take 1 tablet (10 mg total) by mouth at bedtime.   oxyCODONE-acetaminophen (PERCOCET/ROXICET) 5-325 MG tablet Take 1 tablet by mouth every 6 (six) hours as needed for severe pain.   OZEMPIC, 1 MG/DOSE, 4 MG/3ML SOPN Inject 1 mg into the  skin once a week.   pantoprazole (PROTONIX) 40 MG tablet Take 1 tablet (40 mg total) by mouth daily. Take 30-60 min before first meal of the day    pregabalin (LYRICA) 300 MG capsule Take 300 mg by mouth 2 (two) times daily.   rosuvastatin (CRESTOR) 20 MG tablet Take 20 mg by mouth daily.   thiamine 100 MG tablet Take 2.5 tablets (250 mg total) by mouth daily. (Patient taking differently: Take 100 mg by mouth daily.)           Current Meds  Medication Sig   albuterol (VENTOLIN HFA) 108 (90 Base) MCG/ACT inhaler Inhale 1-2 puffs into the lungs every 6 (six) hours as needed for wheezing or shortness of breath.   clopidogrel (PLAVIX) 75 MG tablet Take 75 mg by mouth daily.   FARXIGA 10 MG TABS tablet Take 10 mg by mouth daily.   Fluticasone-Umeclidin-Vilant (TRELEGY ELLIPTA) 100-62.5-25 MCG/ACT AEPB Inhale 1 puff into the lungs daily.   folic acid (FOLVITE) 1 MG tablet Take 1 tablet (1 mg total) by mouth daily.   insulin NPH-regular Human (NOVOLIN 70/30) (70-30) 100 UNIT/ML injection Inject 50 Units into the skin 2 (two) times daily with a meal.   ipratropium-albuterol (DUONEB) 0.5-2.5 (3) MG/3ML SOLN Inhale 3 mLs into the lungs every 6 (six) hours as needed (shortness of breath).   losartan (COZAAR) 50 MG tablet Take 50 mg by mouth daily.   metFORMIN (GLUCOPHAGE) 1000 MG tablet Take 1 tablet (1,000 mg total) by mouth 2 (two) times daily with a meal.   metoprolol succinate (TOPROL-XL) 25 MG 24 hr tablet Take 1 tablet (25 mg total) by mouth daily. (Patient taking differently: Take 12.5 mg by mouth daily.)   montelukast (SINGULAIR) 10 MG tablet Take 1 tablet (10 mg total) by mouth at bedtime.   OZEMPIC, 1 MG/DOSE, 4 MG/3ML SOPN Inject 1 mg into the skin once a week.   pregabalin (LYRICA) 300 MG capsule Take 300 mg by mouth 2 (two) times daily.   rosuvastatin (CRESTOR) 20 MG tablet Take 20 mg by mouth daily.   thiamine 100 MG tablet Take 2.5 tablets (250 mg total) by mouth daily. (Patient taking differently: Take 100 mg by mouth daily.)                 Past Medical History:  Diagnosis Date   COPD (chronic obstructive pulmonary  disease) (HCC)    Coronary artery disease    DM (diabetes mellitus) (HCC)    Heart attack (HCC)    HLD (hyperlipidemia)    HTN (hypertension)    Obesity    Stroke (HCC)          Objective:    Wts  05/23/2022        215   05/07/2022        222  03/28/2022          224  04/17/2021        239 08/08/2020        233  06/27/2020          232  04/30/2020          240   11/29/19 248 lb 6.4 oz (112.7 kg)  11/01/19 250 lb (113.4 kg)  10/25/19 250 lb (113.4 kg)    Vital signs reviewed  05/23/2022  - Note at rest 02 sats  92% on RA   General appearance:    amb wm/ obvious nasal congestion   HEENT : Oropharynx  clear  Nasal turbinates severe edema/ mucoid secretions    NECK :  without  apparent JVD/ palpable Nodes/TM    LUNGS: no acc muscle use,  Min barrel  contour chest wall with bilateral  slightly decreased bs s audible wheeze and  without cough on insp or exp maneuvers and min  Hyperresonant  to  percussion bilaterally    CV:  RRR  no s3 or murmur or increase in P2, and no edema   ABD:  soft and non tender   MS:  Nl gait/ ext warm without deformities Or obvious joint restrictions  calf tenderness, cyanosis or clubbing     SKIN: warm and dry without lesions    NEURO:  alert, approp, nl sensorium with  no motor or cerebellar deficits apparent.          Assessment

## 2022-05-23 ENCOUNTER — Ambulatory Visit (INDEPENDENT_AMBULATORY_CARE_PROVIDER_SITE_OTHER): Payer: Medicaid Other | Admitting: Internal Medicine

## 2022-05-23 ENCOUNTER — Encounter: Payer: Self-pay | Admitting: Internal Medicine

## 2022-05-23 VITALS — BP 112/64 | HR 83 | Ht 69.0 in | Wt 215.6 lb

## 2022-05-23 DIAGNOSIS — J302 Other seasonal allergic rhinitis: Secondary | ICD-10-CM | POA: Diagnosis not present

## 2022-05-23 DIAGNOSIS — J449 Chronic obstructive pulmonary disease, unspecified: Secondary | ICD-10-CM | POA: Diagnosis not present

## 2022-05-23 DIAGNOSIS — J309 Allergic rhinitis, unspecified: Secondary | ICD-10-CM | POA: Insufficient documentation

## 2022-05-23 MED ORDER — TRELEGY ELLIPTA 100-62.5-25 MCG/ACT IN AEPB: 1.0000 | INHALATION_SPRAY | Freq: Every day | RESPIRATORY_TRACT | 11 refills | Status: DC

## 2022-05-23 NOTE — Assessment & Plan Note (Signed)
MM/Quit smoking 02/2019 at wt 260  -  Spirometry only on 11/01/19  No obst p anoro prior to study and minimal concavity to f/v loop  - 04/30/2020  After extensive coaching inhaler device,  effectiveness =    75% > try breztri 2bid > "too jittery" but flared on anoro  - 06/27/2020  After extensive coaching inhaler device,  effectiveness =    75% > rechallenge with breztri one bid  - 08/08/2020  Improved on anoro > continue  - Sinus MRI  08/09/21 : Moderate paranasal sinus mucosal thickening, greatest in the left maxillary sinus. - 03/28/2022  After extensive coaching inhaler device,  effectiveness =    90% dpi / elipta > change trelegy 100 due to freq aecopd x 4 week trial then ov   FENO  03/28/2022 = 21 - LDSCT   04/11/22  Mild centrilobular emphysema. -  05/07/2022  :  allergy screen Eos 0.3/IGE 256  alpha one AT phenotype  MM  level 147   - d/c trelegy rx as uacs 05/07/2022 with just duoneb to reduce upper airway irritability >>> could not tol duoneb (HA) and started back on trelegy 05/21/22 improved to baseline doe  Very sensitive to saba and formoterol(breztri) but tol trelegy ok (x may add to coughing tendency >  continue trelegy for now and refer to allergy  (see allergic rhinitis)

## 2022-05-23 NOTE — Patient Instructions (Addendum)
For nasal congestion >  zyrtec D per bottle instructions   For cough then can still add mucinex dm 1200 mg every 12 hours  My office will be contacting you by phone for referral to allergy in [facility]   - if you don't hear back from my office within one week please call us back or notify us thru MyChart and we'll address it right away.   Pulmonary follow up is as needed

## 2022-05-23 NOTE — Assessment & Plan Note (Signed)
05/07/2022  :  allergy screen Eos 0.3/IGE 256 >  referred to allergy 05/23/2022  - 05/23/2022 added zyrtec d to singulair  Discussed in detail all the  indications, usual  risks and alternatives  relative to the benefits with patient who agrees to proceed with Rx as outlined.             Each maintenance medication was reviewed in detail including emphasizing most importantly the difference between maintenance and prns and under what circumstances the prns are to be triggered using an action plan format where appropriate.  Total time for H and P, chart review, counseling, reviewing hfa/neb/dpi device(s) and generating customized AVS unique to this office visit / same day charting = 36 m

## 2022-05-29 ENCOUNTER — Encounter (HOSPITAL_BASED_OUTPATIENT_CLINIC_OR_DEPARTMENT_OTHER): Payer: Self-pay | Admitting: Plastic Surgery

## 2022-05-29 ENCOUNTER — Other Ambulatory Visit: Payer: Self-pay

## 2022-05-29 NOTE — Progress Notes (Signed)
Pt history severe COPD but patient denies recent SOB except upon extreme exertion. Pt does not require home O2.

## 2022-06-03 ENCOUNTER — Encounter (HOSPITAL_BASED_OUTPATIENT_CLINIC_OR_DEPARTMENT_OTHER)
Admission: RE | Admit: 2022-06-03 | Discharge: 2022-06-03 | Disposition: A | Discharge status: Home / Self Care | Payer: Medicaid Other | Source: Ambulatory Visit | Attending: Plastic Surgery | Admitting: Plastic Surgery

## 2022-06-03 DIAGNOSIS — Z01818 Encounter for other preprocedural examination: Secondary | ICD-10-CM | POA: Diagnosis present

## 2022-06-04 NOTE — Anesthesia Preprocedure Evaluation (Signed)
Anesthesia Evaluation  Patient identified by MRN, date of birth, ID band Patient awake    Reviewed: Allergy & Precautions, NPO status , Patient's Chart, lab work & pertinent test results  History of Anesthesia Complications Negative for: history of anesthetic complications  Airway Mallampati: III  TM Distance: >3 FB Neck ROM: Full   Comment: Previous grade I view with Glidescope, mask with OPA Dental  (+) Dental Advisory Given   Pulmonary neg shortness of breath, neg sleep apnea, COPD (used Trelegy this morning, no recent flares),  COPD inhaler, neg recent URI, former smoker Multiple pulmonary nodules   Pulmonary exam normal breath sounds clear to auscultation       Cardiovascular hypertension (losartan, metoprolol), Pt. on medications and Pt. on home beta blockers (-) angina + CAD, + Past MI and + Cardiac Stents (x2, last one 4 years ago)  (-) CABG (-) dysrhythmias  Rhythm:Regular Rate:Normal  HLD, h/o cardiac arrest  Lexiscan/modified Bruce Tetrofosmin stress test 05/01/2021: Lexiscan/modified Bruce nuclear stress test performed using 1-day protocol. In addition, patient achieved 2.2 METS and reached 82% MPHR.  Normal myocardial perfusion. Stress LVEF 78%. Low risk study.  Echocardiogram 04/23/2021: Normal LV systolic function with visual EF 60-65%. Left ventricle cavity is normal in size. Normal left ventricular wall thickness. Normal global wall motion. Normal diastolic filling pattern, normal LAP. No significant valvular heart disease. No prior study for comparison.  Left Heart Catheterization 03/18/19:  LV normal LVEF grossly. EDP mardekly elevated Right coronary artery nondominant small, normal. Left main large, normal. Ramus intermediate large, normal. Circumflex large, dominant, normal. LAD ostial 50% stenosis.  FFR 0.89, not hemodynamically significant.      Neuro/Psych neg Seizures PSYCHIATRIC DISORDERS  Anxiety      Neuromuscular disease (cervical disc herniation s/p ACDF) CVA (2018), No Residual Symptoms    GI/Hepatic Neg liver ROS,GERD  ,,  Endo/Other  diabetes, Type 2, Oral Hypoglycemic Agents, Insulin Dependent    Renal/GU negative Renal ROS     Musculoskeletal   Abdominal   Peds  Hematology negative hematology ROS (+)   Anesthesia Other Findings Last Plavix: 5 days ago  Last Ozempic: 05/25/2022  Reproductive/Obstetrics                             Anesthesia Physical Anesthesia Plan  ASA: 3  Anesthesia Plan: General   Post-op Pain Management: Tylenol PO (pre-op)*   Induction: Intravenous  PONV Risk Score and Plan: 2 and Ondansetron, Dexamethasone and Treatment may vary due to age or medical condition  Airway Management Planned: LMA  Additional Equipment:   Intra-op Plan:   Post-operative Plan: Extubation in OR  Informed Consent: I have reviewed the patients History and Physical, chart, labs and discussed the procedure including the risks, benefits and alternatives for the proposed anesthesia with the patient or authorized representative who has indicated his/her understanding and acceptance.     Dental advisory given  Plan Discussed with: CRNA and Anesthesiologist  Anesthesia Plan Comments: (Risks of general anesthesia discussed including, but not limited to, sore throat, hoarse voice, chipped/damaged teeth, injury to vocal cords, nausea and vomiting, allergic reactions, lung infection, heart attack, stroke, and death. All questions answered. )       Anesthesia Quick Evaluation

## 2022-06-05 ENCOUNTER — Encounter (HOSPITAL_BASED_OUTPATIENT_CLINIC_OR_DEPARTMENT_OTHER)
Admission: RE | Disposition: A | Discharge status: Home / Self Care | Payer: Self-pay | Source: Home / Self Care | Attending: Plastic Surgery

## 2022-06-05 ENCOUNTER — Ambulatory Visit (HOSPITAL_BASED_OUTPATIENT_CLINIC_OR_DEPARTMENT_OTHER): Payer: Medicaid Other | Admitting: Anesthesiology

## 2022-06-05 ENCOUNTER — Other Ambulatory Visit: Payer: Self-pay

## 2022-06-05 ENCOUNTER — Ambulatory Visit (HOSPITAL_BASED_OUTPATIENT_CLINIC_OR_DEPARTMENT_OTHER)
Admission: RE | Admit: 2022-06-05 | Discharge: 2022-06-05 | Disposition: A | Discharge status: Home / Self Care | Payer: Medicaid Other | Attending: Plastic Surgery | Admitting: Plastic Surgery

## 2022-06-05 ENCOUNTER — Encounter (HOSPITAL_BASED_OUTPATIENT_CLINIC_OR_DEPARTMENT_OTHER): Payer: Self-pay | Admitting: Plastic Surgery

## 2022-06-05 DIAGNOSIS — Z794 Long term (current) use of insulin: Secondary | ICD-10-CM | POA: Diagnosis not present

## 2022-06-05 DIAGNOSIS — K219 Gastro-esophageal reflux disease without esophagitis: Secondary | ICD-10-CM | POA: Diagnosis not present

## 2022-06-05 DIAGNOSIS — R918 Other nonspecific abnormal finding of lung field: Secondary | ICD-10-CM | POA: Diagnosis not present

## 2022-06-05 DIAGNOSIS — Z7984 Long term (current) use of oral hypoglycemic drugs: Secondary | ICD-10-CM | POA: Insufficient documentation

## 2022-06-05 DIAGNOSIS — I10 Essential (primary) hypertension: Secondary | ICD-10-CM | POA: Diagnosis not present

## 2022-06-05 DIAGNOSIS — Z7902 Long term (current) use of antithrombotics/antiplatelets: Secondary | ICD-10-CM | POA: Diagnosis not present

## 2022-06-05 DIAGNOSIS — E669 Obesity, unspecified: Secondary | ICD-10-CM | POA: Diagnosis not present

## 2022-06-05 DIAGNOSIS — Z6831 Body mass index (BMI) 31.0-31.9, adult: Secondary | ICD-10-CM | POA: Insufficient documentation

## 2022-06-05 DIAGNOSIS — L910 Hypertrophic scar: Secondary | ICD-10-CM | POA: Insufficient documentation

## 2022-06-05 DIAGNOSIS — Z01818 Encounter for other preprocedural examination: Secondary | ICD-10-CM

## 2022-06-05 DIAGNOSIS — L905 Scar conditions and fibrosis of skin: Secondary | ICD-10-CM

## 2022-06-05 DIAGNOSIS — J449 Chronic obstructive pulmonary disease, unspecified: Secondary | ICD-10-CM | POA: Insufficient documentation

## 2022-06-05 DIAGNOSIS — Z955 Presence of coronary angioplasty implant and graft: Secondary | ICD-10-CM | POA: Insufficient documentation

## 2022-06-05 DIAGNOSIS — Z87891 Personal history of nicotine dependence: Secondary | ICD-10-CM | POA: Insufficient documentation

## 2022-06-05 DIAGNOSIS — E785 Hyperlipidemia, unspecified: Secondary | ICD-10-CM | POA: Insufficient documentation

## 2022-06-05 DIAGNOSIS — E119 Type 2 diabetes mellitus without complications: Secondary | ICD-10-CM | POA: Diagnosis not present

## 2022-06-05 DIAGNOSIS — I2510 Atherosclerotic heart disease of native coronary artery without angina pectoris: Secondary | ICD-10-CM | POA: Diagnosis not present

## 2022-06-05 DIAGNOSIS — F419 Anxiety disorder, unspecified: Secondary | ICD-10-CM | POA: Diagnosis not present

## 2022-06-05 DIAGNOSIS — I252 Old myocardial infarction: Secondary | ICD-10-CM | POA: Diagnosis not present

## 2022-06-05 HISTORY — PX: SCAR REVISION: SHX5285

## 2022-06-05 LAB — GLUCOSE, CAPILLARY
Glucose-Capillary: 120 mg/dL — ABNORMAL HIGH (ref 70–99)
Glucose-Capillary: 106 mg/dL — ABNORMAL HIGH (ref 70–99)

## 2022-06-05 SURGERY — REVISION, SCAR
Anesthesia: General | Site: Neck

## 2022-06-05 MED ORDER — LIDOCAINE HCL (CARDIAC) PF 100 MG/5ML IV SOSY
PREFILLED_SYRINGE | INTRAVENOUS | Status: DC | PRN
  Administered 2022-06-05: 100 mg via INTRATRACHEAL

## 2022-06-05 MED ORDER — TRIAMCINOLONE ACETONIDE 40 MG/ML IJ SUSP
INTRAMUSCULAR | Status: AC
  Filled 2022-06-05: qty 5

## 2022-06-05 MED ORDER — ACETAMINOPHEN 325 MG RE SUPP: 650.0000 mg | RECTAL | Status: DC | PRN

## 2022-06-05 MED ORDER — ACETAMINOPHEN 500 MG PO TABS
ORAL_TABLET | ORAL | Status: AC
  Filled 2022-06-05: qty 2

## 2022-06-05 MED ORDER — ONDANSETRON HCL 4 MG/2ML IJ SOLN
INTRAMUSCULAR | Status: AC
  Filled 2022-06-05: qty 2

## 2022-06-05 MED ORDER — SODIUM CHLORIDE 0.9% FLUSH: 3.0000 mL | INTRAVENOUS | Status: DC | PRN

## 2022-06-05 MED ORDER — LACTATED RINGERS IV SOLN: INTRAVENOUS | Status: DC

## 2022-06-05 MED ORDER — ONDANSETRON HCL 4 MG/2ML IJ SOLN
INTRAMUSCULAR | Status: DC | PRN
  Administered 2022-06-05: 4 mg via INTRAVENOUS

## 2022-06-05 MED ORDER — OXYCODONE HCL 5 MG PO TABS
ORAL_TABLET | ORAL | Status: AC
  Filled 2022-06-05: qty 1

## 2022-06-05 MED ORDER — EPHEDRINE 5 MG/ML INJ
INTRAVENOUS | Status: AC
  Filled 2022-06-05: qty 5

## 2022-06-05 MED ORDER — CEFAZOLIN SODIUM-DEXTROSE 2-4 GM/100ML-% IV SOLN
INTRAVENOUS | Status: AC
  Filled 2022-06-05: qty 100

## 2022-06-05 MED ORDER — EPHEDRINE SULFATE (PRESSORS) 50 MG/ML IJ SOLN
INTRAMUSCULAR | Status: DC | PRN
  Administered 2022-06-05: 10 mg via INTRAVENOUS
  Administered 2022-06-05: 5 mg via INTRAVENOUS
  Administered 2022-06-05: 10 mg via INTRAVENOUS

## 2022-06-05 MED ORDER — DEXAMETHASONE SODIUM PHOSPHATE 10 MG/ML IJ SOLN
INTRAMUSCULAR | Status: DC | PRN
  Administered 2022-06-05: 5 mg via INTRAVENOUS

## 2022-06-05 MED ORDER — AMISULPRIDE (ANTIEMETIC) 5 MG/2ML IV SOLN: 10.0000 mg | Freq: Once | INTRAVENOUS | Status: DC | PRN

## 2022-06-05 MED ORDER — SODIUM CHLORIDE 0.9 % IV SOLN: 250.0000 mL | INTRAVENOUS | Status: DC | PRN

## 2022-06-05 MED ORDER — MIDAZOLAM HCL 5 MG/5ML IJ SOLN
INTRAMUSCULAR | Status: DC | PRN
  Administered 2022-06-05: 2 mg via INTRAVENOUS

## 2022-06-05 MED ORDER — PROPOFOL 10 MG/ML IV BOLUS
INTRAVENOUS | Status: DC | PRN
  Administered 2022-06-05: 200 mg via INTRAVENOUS

## 2022-06-05 MED ORDER — FENTANYL CITRATE (PF) 100 MCG/2ML IJ SOLN: 25.0000 ug | INTRAMUSCULAR | Status: DC | PRN

## 2022-06-05 MED ORDER — OXYCODONE HCL 5 MG/5ML PO SOLN: 5.0000 mg | Freq: Once | ORAL | Status: AC | PRN

## 2022-06-05 MED ORDER — LIDOCAINE 2% (20 MG/ML) 5 ML SYRINGE
INTRAMUSCULAR | Status: AC
  Filled 2022-06-05: qty 5

## 2022-06-05 MED ORDER — MIDAZOLAM HCL 2 MG/2ML IJ SOLN
INTRAMUSCULAR | Status: AC
  Filled 2022-06-05: qty 2

## 2022-06-05 MED ORDER — OXYCODONE HCL 5 MG PO TABS
5.0000 mg | ORAL_TABLET | Freq: Once | ORAL | Status: AC | PRN
  Administered 2022-06-05: 5 mg via ORAL

## 2022-06-05 MED ORDER — CHLORHEXIDINE GLUCONATE CLOTH 2 % EX PADS: 6.0000 | MEDICATED_PAD | Freq: Once | CUTANEOUS | Status: DC

## 2022-06-05 MED ORDER — FENTANYL CITRATE (PF) 100 MCG/2ML IJ SOLN
INTRAMUSCULAR | Status: DC | PRN
  Administered 2022-06-05 (×2): 50 ug via INTRAVENOUS

## 2022-06-05 MED ORDER — ACETAMINOPHEN 500 MG PO TABS
1000.0000 mg | ORAL_TABLET | Freq: Once | ORAL | Status: AC
  Administered 2022-06-05: 1000 mg via ORAL

## 2022-06-05 MED ORDER — DEXAMETHASONE SODIUM PHOSPHATE 10 MG/ML IJ SOLN
INTRAMUSCULAR | Status: AC
  Filled 2022-06-05: qty 1

## 2022-06-05 MED ORDER — TRIAMCINOLONE ACETONIDE 40 MG/ML IJ SUSP
INTRAMUSCULAR | Status: DC | PRN
  Administered 2022-06-05: 80 mg via INTRAMUSCULAR

## 2022-06-05 MED ORDER — OXYCODONE HCL 5 MG PO TABS: 5.0000 mg | ORAL_TABLET | ORAL | Status: DC | PRN

## 2022-06-05 MED ORDER — PROPOFOL 10 MG/ML IV BOLUS
INTRAVENOUS | Status: AC
  Filled 2022-06-05: qty 20

## 2022-06-05 MED ORDER — SODIUM CHLORIDE 0.9% FLUSH: 3.0000 mL | Freq: Two times a day (BID) | INTRAVENOUS | Status: DC

## 2022-06-05 MED ORDER — ACETAMINOPHEN 325 MG PO TABS: 650.0000 mg | ORAL_TABLET | ORAL | Status: DC | PRN

## 2022-06-05 MED ORDER — LIDOCAINE-EPINEPHRINE 1 %-1:100000 IJ SOLN
INTRAMUSCULAR | Status: DC | PRN
  Administered 2022-06-05: 6 mL via INTRAMUSCULAR

## 2022-06-05 MED ORDER — FENTANYL CITRATE (PF) 100 MCG/2ML IJ SOLN
INTRAMUSCULAR | Status: AC
  Filled 2022-06-05: qty 2

## 2022-06-05 MED ORDER — CEFAZOLIN SODIUM-DEXTROSE 2-4 GM/100ML-% IV SOLN
2.0000 g | INTRAVENOUS | Status: AC
  Administered 2022-06-05: 2 g via INTRAVENOUS

## 2022-06-05 MED ORDER — PHENYLEPHRINE HCL (PRESSORS) 10 MG/ML IV SOLN
INTRAVENOUS | Status: DC | PRN
  Administered 2022-06-05: 80 ug via INTRAVENOUS

## 2022-06-05 SURGICAL SUPPLY — 65 items
ADH SKN CLS APL DERMABOND .7 (GAUZE/BANDAGES/DRESSINGS) ×1
BLADE CLIPPER SURG (BLADE) IMPLANT
BLADE SURG 10 STRL SS (BLADE) IMPLANT
BLADE SURG 15 STRL LF DISP TIS (BLADE) ×1 IMPLANT
BLADE SURG 15 STRL SS (BLADE) ×1
BNDG CMPR 5X2 KNTD ELC UNQ LF (GAUZE/BANDAGES/DRESSINGS)
BNDG CMPR 75X21 PLY HI ABS (MISCELLANEOUS)
BNDG ELASTIC 2INX 5YD STR LF (GAUZE/BANDAGES/DRESSINGS) IMPLANT
CANISTER SUCT 1200ML W/VALVE (MISCELLANEOUS) IMPLANT
CLSR STERI-STRIP ANTIMIC 1/2X4 (GAUZE/BANDAGES/DRESSINGS) IMPLANT
CORD BIPOLAR FORCEPS 12FT (ELECTRODE) IMPLANT
COVER BACK TABLE 60X90IN (DRAPES) ×1 IMPLANT
COVER MAYO STAND STRL (DRAPES) ×1 IMPLANT
DERMABOND ADVANCED .7 DNX12 (GAUZE/BANDAGES/DRESSINGS) IMPLANT
DRAPE LAPAROTOMY 100X72 PEDS (DRAPES) IMPLANT
DRAPE U-SHAPE 76X120 STRL (DRAPES) IMPLANT
ELECT COATED BLADE 2.86 ST (ELECTRODE) IMPLANT
ELECT NDL BLADE 2-5/6 (NEEDLE) ×1 IMPLANT
ELECT NEEDLE BLADE 2-5/6 (NEEDLE) ×1 IMPLANT
ELECT REM PT RETURN 9FT ADLT (ELECTROSURGICAL) ×1
ELECT REM PT RETURN 9FT PED (ELECTROSURGICAL)
ELECTRODE REM PT RETRN 9FT PED (ELECTROSURGICAL) IMPLANT
ELECTRODE REM PT RTRN 9FT ADLT (ELECTROSURGICAL) IMPLANT
GAUZE SPONGE 2X2 STRL 8-PLY (GAUZE/BANDAGES/DRESSINGS) IMPLANT
GAUZE SPONGE 4X4 12PLY STRL LF (GAUZE/BANDAGES/DRESSINGS) IMPLANT
GAUZE STRETCH 2X75IN STRL (MISCELLANEOUS) IMPLANT
GAUZE XEROFORM 1X8 LF (GAUZE/BANDAGES/DRESSINGS) IMPLANT
GLOVE BIO SURGEON STRL SZ 6.5 (GLOVE) ×1 IMPLANT
GOWN STRL REUS W/ TWL LRG LVL3 (GOWN DISPOSABLE) ×2 IMPLANT
GOWN STRL REUS W/TWL LRG LVL3 (GOWN DISPOSABLE) ×2
NDL HYPO 27GX1-1/4 (NEEDLE) IMPLANT
NDL HYPO 30GX1 BEV (NEEDLE) IMPLANT
NEEDLE HYPO 27GX1-1/4 (NEEDLE) IMPLANT
NEEDLE HYPO 30GX1 BEV (NEEDLE) ×1 IMPLANT
NS IRRIG 1000ML POUR BTL (IV SOLUTION) IMPLANT
PACK BASIN DAY SURGERY FS (CUSTOM PROCEDURE TRAY) ×1 IMPLANT
PENCIL SMOKE EVACUATOR (MISCELLANEOUS) ×1 IMPLANT
POWDER MYRIAD MORCLLS FINE 500 (Miscellaneous) IMPLANT
PWDR MYRIAD MORCELLS FINE 500 (Miscellaneous) ×1 IMPLANT
SHEET MEDIUM DRAPE 40X70 STRL (DRAPES) IMPLANT
STRIP CLOSURE SKIN 1/2X4 (GAUZE/BANDAGES/DRESSINGS) IMPLANT
STRIP SUTURE WOUND CLOSURE 1/2 (MISCELLANEOUS) IMPLANT
SUCTION FRAZIER HANDLE 10FR (MISCELLANEOUS) ×1
SUCTION TUBE FRAZIER 10FR DISP (MISCELLANEOUS) IMPLANT
SUT CHROMIC 4 0 P 3 18 (SUTURE) IMPLANT
SUT ETHILON 4 0 PS 2 18 (SUTURE) IMPLANT
SUT MNCRL 6-0 UNDY P1 1X18 (SUTURE) IMPLANT
SUT MNCRL AB 4-0 PS2 18 (SUTURE) IMPLANT
SUT MON AB 3-0 SH 27 (SUTURE) ×1
SUT MON AB 3-0 SH27 (SUTURE) IMPLANT
SUT MON AB 5-0 P3 18 (SUTURE) IMPLANT
SUT NYLON ETHILON 5-0 P-3 1X18 (SUTURE) IMPLANT
SUT PDS 3-0 CT2 (SUTURE) ×1
SUT PDS AB 4-0 SH 27 (SUTURE) IMPLANT
SUT PDS II 3-0 CT2 27 ABS (SUTURE) IMPLANT
SUT PLAIN 5 0 P 3 18 (SUTURE) IMPLANT
SUT VIC AB 4-0 PS2 18 (SUTURE) IMPLANT
SUT VIC AB 5-0 P-3 18X BRD (SUTURE) IMPLANT
SUT VIC AB 5-0 P3 18 (SUTURE)
SUT VICRYL 6 0 P 1 18 (SUTURE) IMPLANT
SYR BULB EAR ULCER 3OZ GRN STR (SYRINGE) IMPLANT
SYR CONTROL 10ML LL (SYRINGE) ×1 IMPLANT
TOWEL GREEN STERILE FF (TOWEL DISPOSABLE) ×1 IMPLANT
TRAY DSU PREP LF (CUSTOM PROCEDURE TRAY) IMPLANT
TUBE CONNECTING 20X1/4 (TUBING) IMPLANT

## 2022-06-05 NOTE — Op Note (Signed)
DATE OF OPERATION: 06/05/2022  LOCATION: [facility]  PREOPERATIVE DIAGNOSIS: Left neck keloid, scar contracture   POSTOPERATIVE DIAGNOSIS: Same  PROCEDURE:  Excision of left neck keloid, scar contracture 3 cm Z plasty tissue rearrangement 6 cm for repair Placement of Myriad powder 500 mg Kenolog injection 2 cc  SURGEON: Cissy Galbreath Sanger Roshini Fulwider, DO  ASSISTANT: Evelena Leyden, PA  EBL: none  CONDITION: Stable  COMPLICATIONS: None  INDICATION: The patient, Robert Rich, is a 57 y.o. male born on 02/06/1965, is here for treatment of a neck scar contracture/keloid after spine surgery.  We tried kenalog in the office without improvement.   PROCEDURE DETAILS:  The patient was seen prior to surgery and marked.  The IV antibiotics were given. The patient was taken to the operating room and given a general anesthetic. A standard time out was performed and all information was confirmed by those in the room. SCDs were placed.   The neck was prepped and draped.  Local was placed.  The area was marked and the 3 cm keloid was excised with the #15 blade.  Hemostasis was achieved with electrocautery.  The tissue was released for improved repositioning. A z plasty tissue rearrangement was then performed.  With the top right arranged inferiorly to the left.  The inferior right was repositioned to the superior right.  All of the myriad was placed in the area.  The kenalog was then injected into the skin. The deep layer was closed with the 4-0 PDS.  The skin was closed with the 4-0 Monocryl.  Dermabond and steri strips were applied with a sterile dressing. The patient was allowed to wake up and taken to recovery room in stable condition at the end of the case. The family was notified at the end of the case.   The advanced practice practitioner (APP) assisted throughout the case.  The APP was essential in retraction and counter traction when needed to make the case progress smoothly.  This  retraction and assistance made it possible to see the tissue plans for the procedure.  The assistance was needed for blood control, tissue re-approximation and assisted with closure of the incision site.

## 2022-06-05 NOTE — Transfer of Care (Signed)
Immediate Anesthesia Transfer of Care Note  Patient: Robert Rich  Procedure(s) Performed: excision of neck skin hypertrophic contracture with myriad placement (Neck) APPLICATION OF SKIN SUBSTITUTE (Neck)  Patient Location: PACU  Anesthesia Type:General  Level of Consciousness: awake, alert , and oriented  Airway & Oxygen Therapy: Patient Spontanous Breathing and Patient connected to face mask oxygen  Post-op Assessment: Report given to RN and Post -op Vital signs reviewed and stable  Post vital signs: Reviewed and stable  Last Vitals:  Vitals Value Taken Time  BP    Temp 36.3 C 06/05/22 1117  Pulse 75 06/05/22 1117  Resp    SpO2 100 % 06/05/22 1117  Vitals shown include unvalidated device data.  Last Pain:  Vitals:   06/05/22 0727  TempSrc: Oral  PainSc: 0-No pain      Patients Stated Pain Goal: 8 (06/05/22 0727)  Complications: No notable events documented.

## 2022-06-05 NOTE — Discharge Instructions (Addendum)
May shower tomorrow Post Anesthesia Home Care Instructions  Activity: Get plenty of rest for the remainder of the day. A responsible individual must stay with you for 24 hours following the procedure.  For the next 24 hours, DO NOT: -Drive a car -Advertising copywriter -Drink alcoholic beverages -Take any medication unless instructed by your physician -Make any legal decisions or sign important papers.  Meals: Start with liquid foods such as gelatin or soup. Progress to regular foods as tolerated. Avoid greasy, spicy, heavy foods. If nausea and/or vomiting occur, drink only clear liquids until the nausea and/or vomiting subsides. Call your physician if vomiting continues.  Special Instructions/Symptoms: Your throat may feel dry or sore from the anesthesia or the breathing tube placed in your throat during surgery. If this causes discomfort, gargle with warm salt water. The discomfort should disappear within 24 hours.  If you had a scopolamine patch placed behind your ear for the management of post- operative nausea and/or vomiting:  1. The medication in the patch is effective for 72 hours, after which it should be removed.  Wrap patch in a tissue and discard in the trash. Wash hands thoroughly with soap and water. 2. You may remove the patch earlier than 72 hours if you experience unpleasant side effects which may include dry mouth, dizziness or visual disturbances. 3. Avoid touching the patch. Wash your hands with soap and water after contact with the patch.   .  Leave dressing in place. Follow up in office. Tylenol or motrin as directed.

## 2022-06-05 NOTE — Anesthesia Procedure Notes (Signed)
Procedure Name: LMA Insertion Date/Time: 06/05/2022 10:33 AM  Performed by: Thornell Mule, CRNAPre-anesthesia Checklist: Patient identified, Emergency Drugs available, Suction available and Patient being monitored Patient Re-evaluated:Patient Re-evaluated prior to induction Oxygen Delivery Method: Circle system utilized Preoxygenation: Pre-oxygenation with 100% oxygen Induction Type: IV induction LMA: LMA inserted LMA Size: 4.0 Number of attempts: 1 Placement Confirmation: positive ETCO2 Tube secured with: Tape Dental Injury: Teeth and Oropharynx as per pre-operative assessment

## 2022-06-05 NOTE — Anesthesia Postprocedure Evaluation (Signed)
Anesthesia Post Note  Patient: Robert Rich  Procedure(s) Performed: excision of neck skin hypertrophic contracture with myriad placement (Neck) APPLICATION OF SKIN SUBSTITUTE (Neck)     Patient location during evaluation: PACU Anesthesia Type: General Level of consciousness: awake Pain management: pain level controlled Vital Signs Assessment: post-procedure vital signs reviewed and stable Respiratory status: spontaneous breathing, nonlabored ventilation and respiratory function stable Cardiovascular status: blood pressure returned to baseline and stable Postop Assessment: no apparent nausea or vomiting Anesthetic complications: no   No notable events documented.  Last Vitals:  Vitals:   06/05/22 1138 06/05/22 1145  BP:  (!) 145/89  Pulse: 71 80  Resp: 11 15  Temp:  36.7 C  SpO2: 95% 96%    Last Pain:  Vitals:   06/05/22 1145  TempSrc:   PainSc: 0-No pain                 [name]

## 2022-06-05 NOTE — Interval H&P Note (Signed)
History and Physical Interval Note:  06/05/2022 7:14 AM  Robert Rich  has presented today for surgery, with the diagnosis of Scar Contracture.  The various methods of treatment have been discussed with the patient and family. After consideration of risks, benefits and other options for treatment, the patient has consented to  Procedure(s): excision of neck skin hypertrophic contracture with myriad placement (N/A) APPLICATION OF SKIN SUBSTITUTE (N/A) as a surgical intervention.  The patient's history has been reviewed, patient examined, no change in status, stable for surgery.  I have reviewed the patient's chart and labs.  Questions were answered to the patient's satisfaction.     Alena Bills Claretta Kendra

## 2022-06-05 NOTE — Interval H&P Note (Signed)
History and Physical Interval Note:  06/05/2022 9:59 AM  Robert Rich  has presented today for surgery, with the diagnosis of Scar Contracture.  The various methods of treatment have been discussed with the patient and family. After consideration of risks, benefits and other options for treatment, the patient has consented to  Procedure(s): excision of neck skin hypertrophic contracture with myriad placement (N/A) APPLICATION OF SKIN SUBSTITUTE (N/A) as a surgical intervention.  The patient's history has been reviewed, patient examined, no change in status, stable for surgery.  I have reviewed the patient's chart and labs.  Questions were answered to the patient's satisfaction.     Alena Bills Bertrand Vowels

## 2022-06-06 ENCOUNTER — Encounter (HOSPITAL_BASED_OUTPATIENT_CLINIC_OR_DEPARTMENT_OTHER): Payer: Self-pay | Admitting: Plastic Surgery

## 2022-06-06 LAB — SURGICAL PATHOLOGY

## 2022-06-13 ENCOUNTER — Ambulatory Visit (INDEPENDENT_AMBULATORY_CARE_PROVIDER_SITE_OTHER): Payer: Medicaid Other | Admitting: Plastic Surgery

## 2022-06-13 VITALS — BP 124/75 | HR 85

## 2022-06-13 DIAGNOSIS — L905 Scar conditions and fibrosis of skin: Secondary | ICD-10-CM

## 2022-06-13 MED ORDER — TRAMADOL HCL 50 MG PO TABS: 50.0000 mg | ORAL_TABLET | Freq: Two times a day (BID) | ORAL | 0 refills | Status: AC | PRN

## 2022-06-13 NOTE — Progress Notes (Signed)
The patient is a 57 year old man who underwent an excision of a scar of his neck with a Z-plasty.  He is doing very well.  Just mild pain.  He requested some pain medicine.  The Peri-Strips were removed and some of the sutures.  New Steri-Strips were applied.  Will plan to see him back in 2 to 3 weeks.  I was going to send in some Toradol but it came up as an allergy.  So I will send in Ultram.

## 2022-06-24 ENCOUNTER — Encounter: Payer: Self-pay | Admitting: Physician Assistant

## 2022-06-24 ENCOUNTER — Ambulatory Visit (INDEPENDENT_AMBULATORY_CARE_PROVIDER_SITE_OTHER): Payer: Medicaid Other | Admitting: Physician Assistant

## 2022-06-24 VITALS — BP 130/83 | HR 75

## 2022-06-24 DIAGNOSIS — L905 Scar conditions and fibrosis of skin: Secondary | ICD-10-CM

## 2022-06-24 NOTE — Progress Notes (Signed)
This is a 57 year old gentleman following up in our clinic status post Z-plasty for excision of scar of his neck by Dr. Ulice Bold on 06/05/2022.  He was last seen in the office on 06/13/2022.  At that time he been doing well.  Since his last office visit he denies any significant complaints or concerns.  He has kept the Steri-Strips on.  On exam Steri-Strips removed revealing clean dry and intact incision, some residual scarring predominantly along the inferior incision.  No surrounding redness, no warmth to touch.  Overall the patient is doing well.  I have advised him to start using scar creams we discussed all the options.  I also discussed avoiding sun exposure and the use of sunscreen.  We also discussed avoiding excess tension on the incision.  The patient has a routine scheduled follow-up visit in several weeks, he will maintain that appointment.  He will call with any future questions or concerns.

## 2022-07-08 ENCOUNTER — Encounter: Payer: Self-pay | Admitting: Physician Assistant

## 2022-07-08 ENCOUNTER — Ambulatory Visit (INDEPENDENT_AMBULATORY_CARE_PROVIDER_SITE_OTHER): Payer: Medicaid Other | Admitting: Physician Assistant

## 2022-07-08 VITALS — BP 121/79 | HR 82

## 2022-07-08 DIAGNOSIS — L905 Scar conditions and fibrosis of skin: Secondary | ICD-10-CM

## 2022-07-08 NOTE — Progress Notes (Signed)
This is a 57 year old gentleman seen in our office for follow-up evaluation status post Z-plasty for excision of scar of his neck by Dr. Ulice Bold on 06/05/2022.  His last seen in the office on 06/24/2022.  At that time he had been doing well without significant issues or concerns.  Since that time he has been using Mederma scar cream.  He notes there is an area along the superior portion of the scar which is firm, he denies any significant surrounding redness or any other issues.  On exam scar is clean dry and intact, there is some firmness along the superior aspect of the scar. Otherwise clean dry and intact. No swelling or surrounding redness.   Overall Robert Rich is doing well. He is concerned with the firmness along the superior portion. I discussed continued scar cream, massage, and to allow it time to settle. He will plan to follow up in 2-3 months for re-evaluation if the area does not improve. He will follow up sooner as needed.

## 2022-07-24 ENCOUNTER — Other Ambulatory Visit: Payer: Self-pay | Admitting: Internal Medicine

## 2022-07-24 DIAGNOSIS — J449 Chronic obstructive pulmonary disease, unspecified: Secondary | ICD-10-CM

## 2022-08-04 ENCOUNTER — Ambulatory Visit: Payer: Medicaid Other | Admitting: Internal Medicine

## 2022-08-14 ENCOUNTER — Emergency Department (HOSPITAL_COMMUNITY)
Admission: EM | Admit: 2022-08-14 | Discharge: 2022-08-14 | Disposition: A | Discharge status: Home / Self Care | Payer: Medicaid Other | Attending: Emergency Medicine | Admitting: Emergency Medicine

## 2022-08-14 DIAGNOSIS — Z79899 Other long term (current) drug therapy: Secondary | ICD-10-CM | POA: Insufficient documentation

## 2022-08-14 DIAGNOSIS — E119 Type 2 diabetes mellitus without complications: Secondary | ICD-10-CM | POA: Diagnosis not present

## 2022-08-14 DIAGNOSIS — Z7902 Long term (current) use of antithrombotics/antiplatelets: Secondary | ICD-10-CM | POA: Insufficient documentation

## 2022-08-14 DIAGNOSIS — Z8673 Personal history of transient ischemic attack (TIA), and cerebral infarction without residual deficits: Secondary | ICD-10-CM | POA: Insufficient documentation

## 2022-08-14 DIAGNOSIS — Z7984 Long term (current) use of oral hypoglycemic drugs: Secondary | ICD-10-CM | POA: Insufficient documentation

## 2022-08-14 DIAGNOSIS — Z794 Long term (current) use of insulin: Secondary | ICD-10-CM | POA: Insufficient documentation

## 2022-08-14 DIAGNOSIS — J449 Chronic obstructive pulmonary disease, unspecified: Secondary | ICD-10-CM | POA: Diagnosis not present

## 2022-08-14 DIAGNOSIS — I2510 Atherosclerotic heart disease of native coronary artery without angina pectoris: Secondary | ICD-10-CM | POA: Diagnosis not present

## 2022-08-14 DIAGNOSIS — I10 Essential (primary) hypertension: Secondary | ICD-10-CM | POA: Insufficient documentation

## 2022-08-14 DIAGNOSIS — Z7951 Long term (current) use of inhaled steroids: Secondary | ICD-10-CM | POA: Insufficient documentation

## 2022-08-14 DIAGNOSIS — H5712 Ocular pain, left eye: Secondary | ICD-10-CM | POA: Insufficient documentation

## 2022-08-14 MED ORDER — FLUORESCEIN SODIUM 1 MG OP STRP
1.0000 | ORAL_STRIP | Freq: Once | OPHTHALMIC | Status: AC
  Administered 2022-08-14: 1 via OPHTHALMIC
  Filled 2022-08-14: qty 1

## 2022-08-14 MED ORDER — TETRACAINE HCL 0.5 % OP SOLN
2.0000 [drp] | Freq: Once | OPHTHALMIC | Status: AC
  Administered 2022-08-14: 2 [drp] via OPHTHALMIC
  Filled 2022-08-14: qty 4

## 2022-08-14 MED ORDER — POLYMYXIN B-TRIMETHOPRIM 10000-0.1 UNIT/ML-% OP SOLN: 1.0000 [drp] | OPHTHALMIC | 0 refills | Status: DC

## 2022-08-14 NOTE — ED Provider Notes (Signed)
Robert Rich AT [facility] Provider Note   CSN: [id number] Arrival date & time: 08/14/22  7829     History  Chief Complaint  Patient presents with   Foreign Body in Eye    left    Robert Rich is a 57 y.o. male history of COPD, CAD, diabetes, hypertension, stroke presents today for evaluation of eye pain.  Patient states he started to have pain in his left eye this morning after waking up.  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  He reports watery eyes but no pus drainage.  Reports sensitive to light.  Patient had orange body swelling into his eye before.  He denies any fever.   Foreign Body in Eye      Past Medical History:  Diagnosis Date   COPD (chronic obstructive pulmonary disease) (HCC)    Coronary artery disease    DM (diabetes mellitus) (HCC)    Dyspnea    Heart attack (HCC)    HLD (hyperlipidemia)    HTN (hypertension)    Obesity    Stroke [facility])    Past Surgical History:  Procedure Laterality Date   ANTERIOR CERVICAL DECOMPRESSION/DISCECTOMY FUSION 4 LEVELS N/A 06/10/2021   Procedure: Anterior cervical discectomy and fusion Cervical four to seven;  Surgeon: Venita Lick, MD;  Location: [facility] OR;  Service: Orthopedics;  Laterality: N/A;   CORONARY PRESSURE/FFR STUDY N/A 03/18/2019   Procedure: INTRAVASCULAR PRESSURE WIRE/FFR STUDY;  Surgeon: [name], MD;  Location: [facility] INVASIVE CV LAB;  Service: Cardiovascular;  Laterality: N/A;   LEFT HEART CATH AND CORONARY ANGIOGRAPHY N/A 03/18/2019   Procedure: LEFT HEART CATH AND CORONARY ANGIOGRAPHY;  Surgeon: [name], MD;  Location: [facility] INVASIVE CV LAB;  Service: Cardiovascular;  Laterality: N/A;   SCAR REVISION N/A 06/05/2022   Procedure: excision of neck skin hypertrophic contracture with myriad placement;  Surgeon: Peggye Form, DO;  Location: [facility];  Service:  Plastics;  Laterality: N/A;     Home Medications Prior to Admission medications   Medication Sig Start Date End Date Taking? Authorizing Provider  albuterol (VENTOLIN HFA) 108 (90 Base) MCG/ACT inhaler Inhale 1-2 puffs into the lungs every 6 (six) hours as needed for wheezing or shortness of breath.    [provider]  clopidogrel (PLAVIX) 75 MG tablet Take 75 mg by mouth daily. 07/22/21   [provider]  famotidine (PEPCID) 20 MG tablet One after supper 05/07/22   [name], MD  FARXIGA 10 MG TABS tablet Take 10 mg by mouth daily. 04/15/21   [provider]  Fluticasone-Umeclidin-Vilant (TRELEGY ELLIPTA) 100-62.5-25 MCG/ACT AEPB Inhale 1 puff into the lungs daily. 05/23/22   [name], MD  folic acid (FOLVITE) 1 MG tablet Take 1 tablet (1 mg total) by mouth daily. 07/22/19   [name], MD  insulin NPH-regular Human (NOVOLIN 70/30) (70-30) 100 UNIT/ML injection Inject 50 Units into the skin 2 (two) times daily with a meal. 08/11/19   [name], [name], MD  ipratropium-albuterol (DUONEB) 0.5-2.5 (3) MG/3ML SOLN Inhale 3 mLs into the lungs every 6 (six) hours as needed (shortness of breath). 04/20/18   [provider]  losartan (COZAAR) 50 MG tablet Take 50 mg by mouth daily. 02/10/21   [provider]  metFORMIN (GLUCOPHAGE) 1000 MG tablet Take 1 tablet (1,000 mg total) by mouth 2 (two) times  daily with a meal. 07/22/19   [name], MD  metoprolol succinate (TOPROL-XL) 25 MG 24 hr tablet Take 1 tablet (25 mg total) by mouth daily. Patient taking differently: Take 12.5 mg by mouth daily. 07/22/19   [name], MD  montelukast (SINGULAIR) 10 MG tablet Take 1 tablet (10 mg total) by mouth at bedtime. 08/11/19   [name], MD  oxyCODONE-acetaminophen (PERCOCET/ROXICET) 5-325 MG tablet Take 1 tablet by mouth every 6 (six) hours as needed for severe pain. 05/20/22   [name], [name], PA-C  OZEMPIC, 1 MG/DOSE, 4 MG/3ML SOPN Inject 1 mg into the skin  once a week. 02/25/22   [provider]  pantoprazole (PROTONIX) 40 MG tablet TAKE 1 TABLET BY MOUTH ONCE DAILY 30 TO 60 MINUTES BEFORE FIRST MEAL OF THE DAY 07/25/22   [name], MD  pregabalin (LYRICA) 300 MG capsule Take 300 mg by mouth 2 (two) times daily. 02/19/22   [provider]  rosuvastatin (CRESTOR) 20 MG tablet Take 20 mg by mouth daily.    [provider]  thiamine 100 MG tablet Take 2.5 tablets (250 mg total) by mouth daily. Patient taking differently: Take 100 mg by mouth daily. 07/22/19   [name], MD      Allergies    Asa [aspirin] and Zolmitriptan    Review of Systems   Review of Systems Negative except as per HPI.  Physical Exam Updated Vital Signs BP 122/80   Pulse 68   Temp 98.2 F (36.8 C) (Oral)   Resp 18   Ht 5\' 10"  (1.778 m)   Wt 100.2 kg   SpO2 95%   BMI 31.71 kg/m  Physical Exam Vitals and nursing note reviewed.  Constitutional:      Appearance: Normal appearance.  HENT:     Head: Normocephalic and atraumatic.     Mouth/Throat:     Mouth: Mucous membranes are moist.  Eyes:     General: No scleral icterus.    Comments: Conjunctival injection to the left eye.  No apparent foreign body noted.  Negative Seidel sign.  No fluorescein uptake.  Cardiovascular:     Rate and Rhythm: Normal rate and regular rhythm.     Pulses: Normal pulses.     Heart sounds: Normal heart sounds.  Pulmonary:     Effort: Pulmonary effort is normal.     Breath sounds: Normal breath sounds.  Abdominal:     General: Abdomen is flat.     Palpations: Abdomen is soft.     Tenderness: There is no abdominal tenderness.  Musculoskeletal:        General: No deformity.  Skin:    General: Skin is warm.     Findings: No rash.  Neurological:     General: No focal deficit present.     Mental Status: He is alert.  Psychiatric:        Mood and Affect: Mood normal.     ED Results / Procedures / Treatments   Labs (all labs ordered are  listed, but only abnormal results are displayed) Labs Reviewed - No data to display  EKG None  Radiology No results found.  Procedures Procedures    Medications Ordered in ED Medications  tetracaine (PONTOCAINE) 0.5 % ophthalmic solution 2 drop (has no administration in time range)  fluorescein ophthalmic strip 1 strip (has no administration in time range)    ED Course/ Medical Decision Making/ A&P  Medical Decision Making Risk Prescription drug management.   This patient presents to the ED for eye pain, this involves an extensive number of treatment options, and is a complaint that carries with a high risk of complications and morbidity.  The differential diagnosis includes corneal abrasion, conjunctivitis, foreign body, traumatic iritis, corneal ulcer, episcleritis, blepharitis.  This is not an exhaustive list.  Problem list/ ED course/ Critical interventions/ Medical management: HPI: See above Vital signs within normal range and stable throughout visit. Laboratory/imaging studies significant for: See above. On physical examination, patient is afebrile and appears in no acute distress.  No fluorescein uptake.  Negative Seidel sign.  No apparent foreign body noted.  IOP is 18 so doubt acute angle-closure glaucoma.  Given history and exam I have low suspicion for globe rupture, uveitis.  Patient likely has conjunctivitis and was prescribed Polytrim eyedrop.  Advised patient to follow-up with his PCP for reevaluation.  Strict return precaution discussed. I have reviewed the patient home medicines and have made adjustments as needed.  Cardiac monitoring/EKG: The patient was maintained on a cardiac monitor.  I personally reviewed and interpreted the cardiac monitor which showed an underlying rhythm of: sinus rhythm.  Additional history obtained: External records from outside source obtained and reviewed including: Chart review including previous notes,  labs, imaging.  Consultations obtained:  Disposition Continued outpatient therapy. Follow-up with PCP recommended for reevaluation of symptoms. Treatment plan discussed with patient.  Pt acknowledged understanding was agreeable to the plan. Worrisome signs and symptoms were discussed with patient, and patient acknowledged understanding to return to the ED if they noticed these signs and symptoms. Patient was stable upon discharge.   This chart was dictated using voice recognition software.  Despite best efforts to proofread,  errors can occur which can change the documentation meaning.          Final Clinical Impression(s) / ED Diagnoses Final diagnoses:  Pain of left eye    Rx / DC Orders ED Discharge Orders          Ordered    trimethoprim-polymyxin b (POLYTRIM) ophthalmic solution  Every 4 hours        08/14/22 1044              Jeanelle Malling, [place] 08/14/22 1048    Bethann Berkshire, MD 08/17/22 1656

## 2022-08-14 NOTE — ED Triage Notes (Signed)
Pt. Came in today complaining of burning and not being able to see out of left eye. 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 Pt. Notes his eye was fine when he went to sleep but woke up with pain in the left eye.

## 2022-08-14 NOTE — Discharge Instructions (Addendum)
Please use your eyedrop as prescribed. Take tylenol/ibuprofen for pain. I recommend close follow-up with PCP for reevaluation.  Please do not hesitate to return to emergency department if worrisome signs symptoms we discussed become apparent.

## 2022-09-12 ENCOUNTER — Ambulatory Visit: Payer: Medicaid Other | Admitting: Plastic Surgery

## 2022-09-19 ENCOUNTER — Ambulatory Visit (INDEPENDENT_AMBULATORY_CARE_PROVIDER_SITE_OTHER): Payer: Medicaid Other | Admitting: Plastic Surgery

## 2022-09-19 DIAGNOSIS — L905 Scar conditions and fibrosis of skin: Secondary | ICD-10-CM | POA: Diagnosis not present

## 2022-09-19 NOTE — Progress Notes (Signed)
Procedure Note  Preoperative Dx: hypertrophic scar of neck  Postoperative Dx: Same  Procedure: kenalog injection to 3 cm hypertrophic neck scar   Description of Procedure: Risks and complications were explained to the patient.  Consent was confirmed and the patient understands the risks and benefits.  The potential complications and alternatives were explained and the patient consents.  The patient expressed understanding the option of not having the procedure and the risks of a scar.  Time out was called and all information was confirmed to be correct.    The area was prepped and drapped.  Lidocaine 1% with epinephrine 0.1 cc was mixed with Kenalog 50/5 mg 0.2 cc.  The mixture was injected in the 3 cm neck sacr. A dressing was applied.  The patient was given instructions on how to care for the area and a follow up appointment.  Robert Rich tolerated the procedure well and there were no complications.

## 2022-09-21 ENCOUNTER — Other Ambulatory Visit: Payer: Self-pay | Admitting: Internal Medicine

## 2022-09-21 DIAGNOSIS — J449 Chronic obstructive pulmonary disease, unspecified: Secondary | ICD-10-CM

## 2022-11-04 ENCOUNTER — Other Ambulatory Visit: Payer: Self-pay

## 2022-11-04 ENCOUNTER — Encounter (HOSPITAL_COMMUNITY): Payer: Self-pay

## 2022-11-04 ENCOUNTER — Emergency Department (HOSPITAL_COMMUNITY): Payer: Medicaid Other

## 2022-11-04 ENCOUNTER — Emergency Department (HOSPITAL_COMMUNITY)
Admission: EM | Admit: 2022-11-04 | Discharge: 2022-11-04 | Disposition: A | Discharge status: Home / Self Care | Payer: Medicaid Other | Attending: Emergency Medicine | Admitting: Emergency Medicine

## 2022-11-04 DIAGNOSIS — R21 Rash and other nonspecific skin eruption: Secondary | ICD-10-CM | POA: Diagnosis present

## 2022-11-04 DIAGNOSIS — J449 Chronic obstructive pulmonary disease, unspecified: Secondary | ICD-10-CM | POA: Diagnosis not present

## 2022-11-04 DIAGNOSIS — I2510 Atherosclerotic heart disease of native coronary artery without angina pectoris: Secondary | ICD-10-CM | POA: Insufficient documentation

## 2022-11-04 DIAGNOSIS — E119 Type 2 diabetes mellitus without complications: Secondary | ICD-10-CM | POA: Insufficient documentation

## 2022-11-04 DIAGNOSIS — Z794 Long term (current) use of insulin: Secondary | ICD-10-CM | POA: Insufficient documentation

## 2022-11-04 DIAGNOSIS — I10 Essential (primary) hypertension: Secondary | ICD-10-CM | POA: Diagnosis not present

## 2022-11-04 DIAGNOSIS — L304 Erythema intertrigo: Secondary | ICD-10-CM | POA: Diagnosis not present

## 2022-11-04 DIAGNOSIS — D72829 Elevated white blood cell count, unspecified: Secondary | ICD-10-CM | POA: Insufficient documentation

## 2022-11-04 DIAGNOSIS — Z7901 Long term (current) use of anticoagulants: Secondary | ICD-10-CM | POA: Diagnosis not present

## 2022-11-04 LAB — CBC WITH DIFFERENTIAL/PLATELET
Abs Immature Granulocytes: 0.04 10*3/uL (ref 0.00–0.07)
Basophils Absolute: 0.2 10*3/uL — ABNORMAL HIGH (ref 0.0–0.1)
Basophils Relative: 1 %
Eosinophils Absolute: 0.5 10*3/uL (ref 0.0–0.5)
Eosinophils Relative: 4 %
HCT: 47.6 % (ref 39.0–52.0)
Hemoglobin: 15.5 g/dL (ref 13.0–17.0)
Immature Granulocytes: 0 %
Lymphocytes Relative: 35 %
Lymphs Abs: 4.5 10*3/uL — ABNORMAL HIGH (ref 0.7–4.0)
MCH: 28.3 pg (ref 26.0–34.0)
MCHC: 32.6 g/dL (ref 30.0–36.0)
MCV: 86.9 fL (ref 80.0–100.0)
Monocytes Absolute: 1 10*3/uL (ref 0.1–1.0)
Monocytes Relative: 8 %
Neutro Abs: 6.8 10*3/uL (ref 1.7–7.7)
Neutrophils Relative %: 52 %
Platelets: 405 10*3/uL — ABNORMAL HIGH (ref 150–400)
RBC: 5.48 MIL/uL (ref 4.22–5.81)
RDW: 12.4 % (ref 11.5–15.5)
WBC: 13.1 10*3/uL — ABNORMAL HIGH (ref 4.0–10.5)
nRBC: 0 % (ref 0.0–0.2)

## 2022-11-04 LAB — BASIC METABOLIC PANEL
Anion gap: 11 (ref 5–15)
BUN: 21 mg/dL — ABNORMAL HIGH (ref 6–20)
CO2: 28 mmol/L (ref 22–32)
Calcium: 9.2 mg/dL (ref 8.9–10.3)
Chloride: 97 mmol/L — ABNORMAL LOW (ref 98–111)
Creatinine, Ser: 0.8 mg/dL (ref 0.61–1.24)
GFR, Estimated: >60 mL/min (ref >60)
Glucose, Bld: 102 mg/dL — ABNORMAL HIGH (ref 70–99)
Potassium: 4 mmol/L (ref 3.5–5.1)
Sodium: 136 mmol/L (ref 135–145)

## 2022-11-04 MED ORDER — ACETAMINOPHEN 500 MG PO TABS
1000.0000 mg | ORAL_TABLET | Freq: Once | ORAL | Status: DC
  Filled 2022-11-04: qty 2

## 2022-11-04 MED ORDER — NYSTATIN 100000 UNIT/GM EX POWD: 1.0000 | Freq: Three times a day (TID) | CUTANEOUS | 0 refills | Status: DC

## 2022-11-04 MED ORDER — IOHEXOL 300 MG/ML  SOLN
100.0000 mL | Freq: Once | INTRAMUSCULAR | Status: AC | PRN
  Administered 2022-11-04: 100 mL via INTRAVENOUS

## 2022-11-04 NOTE — Discharge Instructions (Addendum)
We evaluated you for irritation around your hernia.  Your symptoms seem most consistent with a type of fungal infection called intertrigo.  This happens in moist areas and can cause skin irritation and pain.  Your CT scan was reassuring but did show your hernia.  Schedule follow-up with Dr. Lovell Sheehan. Please use your prescribed medication 3 times daily.  If you have any new or worsening symptoms such as worsening redness or pain, nausea or vomiting, fevers or chills, drainage of pus, or any other new symptoms, please return to the emergency department for reassessment.

## 2022-11-04 NOTE — ED Provider Notes (Signed)
[place] EMERGENCY DEPARTMENT AT [facility] [address] Provider Note  CSN: [id number] Arrival date & time: 11/04/22 1636  Chief Complaint(s) Hernia  HPI Robert Rich is a 57 y.o. male with history of COPD, diabetes, hypertension, hyperlipidemia presenting to the emergency department with hernia issue.  He reports that he has an umbilical hernia, today noticed some redness around the hernia.  No nausea, vomiting, having normal bowel movements.  No fevers or chills.  Reports it is painful and irritated.   Past Medical History Past Medical History:  Diagnosis Date   COPD (chronic obstructive pulmonary disease) (HCC)    Coronary artery disease    DM (diabetes mellitus) (HCC)    Dyspnea    Heart attack (HCC)    HLD (hyperlipidemia)    HTN (hypertension)    Obesity    Stroke [facility])    Patient Active Problem List   Diagnosis Date Noted   Allergic rhinitis 05/23/2022   Former cigarette smoker 03/28/2022   Scar contracture 01/03/2022   Acute encephalopathy 08/09/2021   Cervical disc herniation 06/10/2021   Exertional chest pain 08/09/2020   COPD (chronic obstructive pulmonary disease) (HCC) 12/20/2019   Type 2 diabetes mellitus without complication (HCC) 12/20/2019   COPD GOLD 0/ AB 08/24/2019   Multiple pulmonary nodules determined by computed tomography of lung 08/24/2019   COPD exacerbation (HCC)    COPD with acute exacerbation (HCC) 08/04/2019   Coronary artery disease    HLD (hyperlipidemia)    Leukocytosis    Hyperglycemia    Steroid-induced hyperglycemia    Acute respiratory failure with hypoxia (HCC)    Morbid obesity due to excess calories (HCC)    Hypertensive crisis    Anxiety    Substance abuse (HCC)    Cardiac arrest (HCC) 03/17/2019   2019 novel coronavirus disease (COVID-19) 12/07/2018   Tobacco abuse 12/06/2018   Home Medication(s) Prior to Admission medications   Medication Sig Start Date End Date Taking? Authorizing Provider  nystatin  (MYCOSTATIN/NYSTOP) powder Apply 1 Application topically 3 (three) times daily. 11/04/22  Yes Lonell Grandchild, MD  albuterol (VENTOLIN HFA) 108 (90 Base) MCG/ACT inhaler Inhale 1-2 puffs into the lungs every 6 (six) hours as needed for wheezing or shortness of breath.    [provider]  clopidogrel (PLAVIX) 75 MG tablet Take 75 mg by mouth daily. 07/22/21   [provider]  famotidine (PEPCID) 20 MG tablet One after supper 05/07/22   [name], MD  FARXIGA 10 MG TABS tablet Take 10 mg by mouth daily. 04/15/21   [provider]  Fluticasone-Umeclidin-Vilant (TRELEGY ELLIPTA) 100-62.5-25 MCG/ACT AEPB Inhale 1 puff into the lungs daily. 05/23/22   [name], MD  folic acid (FOLVITE) 1 MG tablet Take 1 tablet (1 mg total) by mouth daily. 07/22/19   [name], MD  insulin NPH-regular Human (NOVOLIN 70/30) (70-30) 100 UNIT/ML injection Inject 50 Units into the skin 2 (two) times daily with a meal. 08/11/19   [name], [name], MD  ipratropium-albuterol (DUONEB) 0.5-2.5 (3) MG/3ML SOLN Inhale 3 mLs into the lungs every 6 (six) hours as needed (shortness of breath). 04/20/18   [provider]  losartan (COZAAR) 50 MG tablet Take 50 mg by mouth daily. 02/10/21   [provider]  metFORMIN (GLUCOPHAGE) 1000 MG tablet Take 1 tablet (1,000 mg total) by mouth 2 (two) times daily with a meal. 07/22/19   [name], MD  metoprolol succinate (TOPROL-XL) 25 MG 24 hr tablet Take 1 tablet (  25 mg total) by mouth daily. Patient taking differently: Take 12.5 mg by mouth daily. 07/22/19   [name], MD  montelukast (SINGULAIR) 10 MG tablet Take 1 tablet (10 mg total) by mouth at bedtime. 08/11/19   [name], MD  oxyCODONE-acetaminophen (PERCOCET/ROXICET) 5-325 MG tablet Take 1 tablet by mouth every 6 (six) hours as needed for severe pain. 05/20/22   [name], [name], PA-C  OZEMPIC, 1 MG/DOSE, 4 MG/3ML SOPN Inject 1 mg into the skin once a week. 02/25/22   [provider]  pantoprazole (PROTONIX) 40 MG tablet TAKE 1 TABLET BY MOUTH ONCE DAILY 30 TO 60 MINUTES BEFORE FIRST MEAL OF THE DAY 09/25/22   [name], MD  pregabalin (LYRICA) 300 MG capsule Take 300 mg by mouth 2 (two) times daily. 02/19/22   [provider]  rosuvastatin (CRESTOR) 20 MG tablet Take 20 mg by mouth daily.    [provider]  thiamine 100 MG tablet Take 2.5 tablets (250 mg total) by mouth daily. Patient taking differently: Take 100 mg by mouth daily. 07/22/19   [name], MD  trimethoprim-polymyxin b (POLYTRIM) ophthalmic solution Place 1 drop into the left eye every 4 (four) hours. 08/14/22   Jeanelle Malling, PA                                                                                                                                    Past Surgical History Past Surgical History:  Procedure Laterality Date   ANTERIOR CERVICAL DECOMPRESSION/DISCECTOMY FUSION 4 LEVELS N/A 06/10/2021   Procedure: Anterior cervical discectomy and fusion Cervical four to seven;  Surgeon: Venita Lick, MD;  Location: [facility] OR;  Service: Orthopedics;  Laterality: N/A;   CORONARY PRESSURE/FFR STUDY N/A 03/18/2019   Procedure: INTRAVASCULAR PRESSURE WIRE/FFR STUDY;  Surgeon: [name], MD;  Location: [facility] INVASIVE CV LAB;  Service: Cardiovascular;  Laterality: N/A;   LEFT HEART CATH AND CORONARY ANGIOGRAPHY N/A 03/18/2019   Procedure: LEFT HEART CATH AND CORONARY ANGIOGRAPHY;  Surgeon: [name], MD;  Location: [facility] INVASIVE CV LAB;  Service: Cardiovascular;  Laterality: N/A;   SCAR REVISION N/A 06/05/2022   Procedure: excision of neck skin hypertrophic contracture with myriad placement;  Surgeon: Peggye Form, DO;  Location: [facility];  Service: Plastics;  Laterality: N/A;   Family History Family History  Problem Relation Age of Onset   Heart disease Mother    Heart failure Mother    Atrial fibrillation Mother    Kidney disease Father     Social  History Social History   Tobacco Use   Smoking status: Former    Current packs/day: 0.00    Average packs/day: 1 pack/day for 34.0 years (34.0 ttl pk-yrs)    Types: Cigarettes    Start date: 03/16/1986    Quit date: 03/16/2020    Years since quitting: 2.6   Smokeless tobacco: Never   Tobacco comments:  Quit in feb of 2021  Vaping Use   Vaping status: Never Used  Substance Use Topics   Alcohol use: Not Currently    Alcohol/week: 12.0 standard drinks of alcohol    Types: 12 Cans of beer per week    Comment: Quit in Feb 2021   Drug use: Not Currently   Allergies Asa [aspirin] and Zolmitriptan  Review of Systems Review of Systems  All other systems reviewed and are negative.   Physical Exam Vital Signs  I have reviewed the triage vital signs BP (!) 122/91 (BP Location: Right Arm)   Pulse 77   Temp 98.6 F (37 C) (Oral)   Resp 12   Ht 5\' 9"  (1.753 m)   Wt 95.3 kg   SpO2 98%   BMI 31.01 kg/m  Physical Exam Vitals and nursing note reviewed.  Constitutional:      General: He is not in acute distress.    Appearance: Normal appearance.  HENT:     Mouth/Throat:     Mouth: Mucous membranes are moist.  Eyes:     Conjunctiva/sclera: Conjunctivae normal.  Cardiovascular:     Rate and Rhythm: Normal rate and regular rhythm.  Pulmonary:     Effort: Pulmonary effort is normal. No respiratory distress.     Breath sounds: Normal breath sounds.  Abdominal:     General: Abdomen is flat.     Palpations: Abdomen is soft.     Tenderness: There is no abdominal tenderness.     Comments: Umbilical hernia present which is tender but reducible.  Has overlying redness in the umbilicus with some moist skin, mostly around rim where hernia contacts wall of umbilicus, seems consistent with intertrigo  Musculoskeletal:     Right lower leg: No edema.     Left lower leg: No edema.  Skin:    General: Skin is warm and dry.     Capillary Refill: Capillary refill takes less than 2 seconds.   Neurological:     Mental Status: He is alert and oriented to person, place, and time. Mental status is at baseline.  Psychiatric:        Mood and Affect: Mood normal.        Behavior: Behavior normal.     ED Results and Treatments Labs (all labs ordered are listed, but only abnormal results are displayed) Labs Reviewed  BASIC METABOLIC PANEL - Abnormal; Notable for the following components:      Result Value   Chloride 97 (*)    Glucose, Bld 102 (*)    BUN 21 (*)    All other components within normal limits  CBC WITH DIFFERENTIAL/PLATELET - Abnormal; Notable for the following components:   WBC 13.1 (*)    Platelets 405 (*)    Lymphs Abs 4.5 (*)    Basophils Absolute 0.2 (*)    All other components within normal limits  Radiology CT ABDOMEN PELVIS W CONTRAST  Result Date: 11/04/2022 CLINICAL DATA:  Bleeding periumbilical hernia. Acute nonlocalized abdominal pain. EXAM: CT ABDOMEN AND PELVIS WITH CONTRAST TECHNIQUE: Multidetector CT imaging of the abdomen and pelvis was performed using the standard protocol following bolus administration of intravenous contrast. RADIATION DOSE REDUCTION: This exam was performed according to the departmental dose-optimization program which includes automated exposure control, adjustment of the mA and/or kV according to patient size and/or use of iterative reconstruction technique. CONTRAST:  OMNIPAQUE IOHEXOL 300 MG/ML  SOLN COMPARISON:  None Available. FINDINGS: Lower chest: No acute abnormality. Hepatobiliary: No focal liver abnormality is seen. No gallstones, gallbladder wall thickening, or biliary dilatation. Pancreas: Unremarkable Spleen: Unremarkable Adrenals/Urinary Tract: The adrenal glands are unremarkable. The kidneys are normal. Small left posterolateral congenital bladder diverticulum. The bladder is otherwise  unremarkable. Stomach/Bowel: Stomach is within normal limits. Appendix appears normal. No evidence of bowel wall thickening, distention, or inflammatory changes. Vascular/Lymphatic: Aortic atherosclerosis. No enlarged abdominal or pelvic lymph nodes. Reproductive: Prostate is unremarkable. Other: Small fat containing umbilical and bilateral inguinal hernias are present. There is mild infiltration of the mesenteric fat within the umbilical hernia sac which may reflect changes of incarceration. No abdominopelvic ascites. Musculoskeletal: No acute or significant osseous findings. IMPRESSION: 1. Small fat containing umbilical and bilateral inguinal hernias. Mild infiltration of the mesenteric fat within the umbilical hernia sac may reflect changes of incarceration. Correlation with clinical examination is recommended. Aortic Atherosclerosis (ICD10-I70.0). Electronically Signed   By: Helyn Numbers M.D.   On: 11/04/2022 22:20    Pertinent labs & imaging results that were available during my care of the patient were reviewed by me and considered in my medical decision making (see MDM for details).  Medications Ordered in ED Medications  acetaminophen (TYLENOL) tablet 1,000 mg (1,000 mg Oral Patient Refused/Not Given 11/04/22 2051)  iohexol (OMNIPAQUE) 300 MG/ML solution 100 mL (100 mLs Intravenous Contrast Given 11/04/22 2159)                                                                                                                                     Procedures Procedures  (including critical care time)  Medical Decision Making / ED Course   MDM:  57 year old male presenting to the emergency department with hernia issue.  On physical exam, patient has some redness inside of his umbilicus where the hernia is present.  The hernia itself seems reducible and the tenderness seems primarily over the area of redness and irritation.  Seems more consistent with intertrigo and localized irritation than any  underlying hernia issue, but will check CT to evaluate for any acute issues.  If CT shows no internal derangements likely treat for intertrigo and discharged with close general surgery follow-up.  Clinical Course as of 11/04/22 2255  Tue Nov 04, 2022  2253 Umbilical hernia is fat-containing.  CT reports some possible infiltration of mesenteric fat which could represent  incarceration, clinically is still easily reducible.  Discussed follow-up with general surgery.  Will trial nystatin for suspected intertrigo, discussed return precautions for any worsening symptoms. Will discharge patient to home. All questions answered. Patient comfortable with plan of discharge. Return precautions discussed with patient and specified on the after visit summary.  [WS]    Clinical Course User Index [WS] Lonell Grandchild, MD      Lab Tests: -I ordered, reviewed, and interpreted labs.   The pertinent results include:   Labs Reviewed  BASIC METABOLIC PANEL - Abnormal; Notable for the following components:      Result Value   Chloride 97 (*)    Glucose, Bld 102 (*)    BUN 21 (*)    All other components within normal limits  CBC WITH DIFFERENTIAL/PLATELET - Abnormal; Notable for the following components:   WBC 13.1 (*)    Platelets 405 (*)    Lymphs Abs 4.5 (*)    Basophils Absolute 0.2 (*)    All other components within normal limits    Notable for nonspecific leukocytosis   Imaging Studies ordered: I ordered imaging studies including CT abdomen On my interpretation imaging demonstrates fat containing hernia  I independently visualized and interpreted imaging. I agree with the radiologist interpretation   Medicines ordered and prescription drug management: Meds ordered this encounter  Medications   acetaminophen (TYLENOL) tablet 1,000 mg   iohexol (OMNIPAQUE) 300 MG/ML solution 100 mL   nystatin (MYCOSTATIN/NYSTOP) powder    Sig: Apply 1 Application topically 3 (three) times daily.     Dispense:  15 g    Refill:  0    -I have reviewed the patients home medicines and have made adjustments as needed  Social Determinants of Health:  Diagnosis or treatment significantly limited by social determinants of health: obesity   Reevaluation: After the interventions noted above, I reevaluated the patient and found that their symptoms have improved  Co morbidities that complicate the patient evaluation  Past Medical History:  Diagnosis Date   COPD (chronic obstructive pulmonary disease) (HCC)    Coronary artery disease    DM (diabetes mellitus) (HCC)    Dyspnea    Heart attack (HCC)    HLD (hyperlipidemia)    HTN (hypertension)    Obesity    Stroke [facility])       Dispostion: Disposition decision including need for hospitalization was considered, and patient discharged from emergency department.    Final Clinical Impression(s) / ED Diagnoses Final diagnoses:  Intertrigo     This chart was dictated using voice recognition software.  Despite best efforts to proofread,  errors can occur which can change the documentation meaning.    Lonell Grandchild, MD 11/04/22 2255

## 2022-11-04 NOTE — ED Triage Notes (Addendum)
Hernia near umbilicus for about 5 years. Bleeding started today. Burning and tingling sensation near site. Been sore a couple of days. Hurts more with movement. Denies hernia increasing in size. Denies any activity before the pain started.  Hernia not bleeding currently.

## 2022-11-13 ENCOUNTER — Encounter: Payer: Self-pay | Admitting: General Surgery

## 2022-11-13 ENCOUNTER — Ambulatory Visit: Payer: Medicaid Other | Admitting: General Surgery

## 2022-11-13 VITALS — BP 118/82 | HR 81 | Temp 97.5°F | Resp 14 | Ht 69.0 in | Wt 200.0 lb

## 2022-11-13 DIAGNOSIS — K429 Umbilical hernia without obstruction or gangrene: Secondary | ICD-10-CM | POA: Diagnosis not present

## 2022-11-13 NOTE — H&P (Signed)
[facility] History and Physical  Reason for Referral: Umbilical hernia  Referring Physician: Rebecka Apley, NP    Chief Complaint   New Patient (Initial Visit)     Robert Rich is a 57 y.o. male.  HPI: Robert Rich reports having a recent infection of his umbilical area where it was red and swollen and draining a foul smelling drainage.  He had a CT that demonstrated an umbilical hernia with fat. He says that since that episode he has been using peroxide in the umbilicus to keep the area clean. The hernia was reducible in the ED but it is stuck out again. The ED felt like the area had intertrigo.   Past Medical History:  Diagnosis Date   COPD (chronic obstructive pulmonary disease) (HCC)    Coronary artery disease    DM (diabetes mellitus) (HCC)    Dyspnea    Heart attack (HCC)    HLD (hyperlipidemia)    HTN (hypertension)    Obesity    Stroke [facility])     Past Surgical History:  Procedure Laterality Date   ANTERIOR CERVICAL DECOMPRESSION/DISCECTOMY FUSION 4 LEVELS N/A 06/10/2021   Procedure: Anterior cervical discectomy and fusion Cervical four to seven;  Surgeon: Venita Lick, MD;  Location: [facility] OR;  Service: Orthopedics;  Laterality: N/A;   CORONARY PRESSURE/FFR STUDY N/A 03/18/2019   Procedure: INTRAVASCULAR PRESSURE WIRE/FFR STUDY;  Surgeon: [name], MD;  Location: [facility];  Service: Cardiovascular;  Laterality: N/A;   LEFT HEART CATH AND CORONARY ANGIOGRAPHY N/A 03/18/2019   Procedure: LEFT HEART CATH AND CORONARY ANGIOGRAPHY;  Surgeon: [name], MD;  Location: [facility];  Service: Cardiovascular;  Laterality: N/A;   SCAR REVISION N/A 06/05/2022   Procedure: excision of neck skin hypertrophic contracture with myriad placement;  Surgeon: Peggye Form, DO;  Location: [facility];  Service: Plastics;  Laterality: N/A;    Family History  Problem Relation Age of Onset   Heart disease Mother    Heart  failure Mother    Atrial fibrillation Mother    Kidney disease Father     Social History   Tobacco Use   Smoking status: Former    Current packs/day: 0.00    Average packs/day: 1 pack/day for 34.0 years (34.0 ttl pk-yrs)    Types: Cigarettes    Start date: 03/16/1986    Quit date: 03/16/2020    Years since quitting: 2.6   Smokeless tobacco: Never   Tobacco comments:    Quit in feb of 2021  Vaping Use   Vaping status: Never Used  Substance Use Topics   Alcohol use: Not Currently    Alcohol/week: 12.0 standard drinks of alcohol    Types: 12 Cans of beer per week    Comment: Quit in Feb 2021   Drug use: Not Currently    Medications: I have reviewed the patient's current medications. Allergies as of 11/13/2022       Reactions   Asa [aspirin] Hives   Zolmitriptan Nausea And Vomiting, Other (See Comments)   Severe headaches        Medication List        Accurate as of November 13, 2022  3:15 PM. If you have any questions, ask your nurse or doctor.          albuterol 108 (90 Base) MCG/ACT inhaler Commonly known as: VENTOLIN HFA Inhale 1-2 puffs into the lungs every 6 (six) hours as needed for wheezing or  shortness of breath.   clopidogrel 75 MG tablet Commonly known as: PLAVIX Take 75 mg by mouth daily.   famotidine 20 MG tablet Commonly known as: Pepcid One after supper   Farxiga 10 MG Tabs tablet Generic drug: dapagliflozin propanediol Take 10 mg by mouth daily.   folic acid 1 MG tablet Commonly known as: FOLVITE Take 1 tablet (1 mg total) by mouth daily.   ipratropium-albuterol 0.5-2.5 (3) MG/3ML Soln Commonly known as: DUONEB Inhale 3 mLs into the lungs every 6 (six) hours as needed (shortness of breath).   losartan 50 MG tablet Commonly known as: COZAAR Take 50 mg by mouth daily.   metFORMIN 1000 MG tablet Commonly known as: Glucophage Take 1 tablet (1,000 mg total) by mouth 2 (two) times daily with a meal.   metoprolol succinate 25 MG 24 hr  tablet Commonly known as: TOPROL-XL Take 1 tablet (25 mg total) by mouth daily. What changed: how much to take   montelukast 10 MG tablet Commonly known as: SINGULAIR Take 1 tablet (10 mg total) by mouth at bedtime.   NovoLIN 70/30 (70-30) 100 UNIT/ML injection Generic drug: insulin NPH-regular Human Inject 50 Units into the skin 2 (two) times daily with a meal.   nystatin powder Commonly known as: MYCOSTATIN/NYSTOP Apply 1 Application topically 3 (three) times daily.   oxyCODONE-acetaminophen 5-325 MG tablet Commonly known as: PERCOCET/ROXICET Take 1 tablet by mouth every 6 (six) hours as needed for severe pain.   Ozempic (1 MG/DOSE) 4 MG/3ML Sopn Generic drug: Semaglutide (1 MG/DOSE) Inject 1 mg into the skin once a week.   pantoprazole 40 MG tablet Commonly known as: PROTONIX TAKE 1 TABLET BY MOUTH ONCE DAILY 30 TO 60 MINUTES BEFORE FIRST MEAL OF THE DAY   pregabalin 300 MG capsule Commonly known as: LYRICA Take 300 mg by mouth 2 (two) times daily.   rosuvastatin 20 MG tablet Commonly known as: CRESTOR Take 20 mg by mouth daily.   thiamine 100 MG tablet Commonly known as: VITAMIN B1 Take 2.5 tablets (250 mg total) by mouth daily. What changed: how much to take   Trelegy Ellipta 100-62.5-25 MCG/ACT Aepb Generic drug: Fluticasone-Umeclidin-Vilant Inhale 1 puff into the lungs daily.   trimethoprim-polymyxin b ophthalmic solution Commonly known as: Polytrim Place 1 drop into the left eye every 4 (four) hours.         ROS:  A comprehensive review of systems was negative except for: Gastrointestinal: positive for abdominal pain and drainage from umbilicus  Blood pressure 118/82, pulse 81, temperature (!) 97.5 F (36.4 C), temperature source Oral, resp. rate 14, height 5\' 9"  (1.753 m), weight 200 lb (90.7 kg), SpO2 95%. Physical Exam Vitals reviewed.  HENT:     Head: Normocephalic.     Nose: Nose normal.  Eyes:     Extraocular Movements: Extraocular  movements intact.  Cardiovascular:     Rate and Rhythm: Normal rate and regular rhythm.  Pulmonary:     Effort: Pulmonary effort is normal.     Breath sounds: Normal breath sounds.  Abdominal:     General: There is no distension.     Palpations: Abdomen is soft.     Tenderness: There is abdominal tenderness.     Hernia: A hernia is present. Hernia is present in the umbilical area.     Comments: Nonreducible   Musculoskeletal:        General: Normal range of motion.     Cervical back: Normal range of motion.  Skin:  General: Skin is warm.  Neurological:     General: No focal deficit present.     Mental Status: He is alert and oriented to person, place, and time.  Psychiatric:        Mood and Affect: Mood normal.        Behavior: Behavior normal.        Thought Content: Thought content normal.     Results: personally reviewed 1.5cm defect, thin skin but nothing else between sac.  CLINICAL DATA:  Bleeding periumbilical hernia. Acute nonlocalized abdominal pain.   EXAM: CT ABDOMEN AND PELVIS WITH CONTRAST   TECHNIQUE: Multidetector CT imaging of the abdomen and pelvis was performed using the standard protocol following bolus administration of intravenous contrast.   RADIATION DOSE REDUCTION: This exam was performed according to the departmental dose-optimization program which includes automated exposure control, adjustment of the mA and/or kV according to patient size and/or use of iterative reconstruction technique.   CONTRAST:  OMNIPAQUE IOHEXOL 300 MG/ML  SOLN   COMPARISON:  None Available.   FINDINGS: Lower chest: No acute abnormality.   Hepatobiliary: No focal liver abnormality is seen. No gallstones, gallbladder wall thickening, or biliary dilatation.   Pancreas: Unremarkable   Spleen: Unremarkable   Adrenals/Urinary Tract: The adrenal glands are unremarkable. The kidneys are normal. Small left posterolateral congenital bladder diverticulum. The bladder  is otherwise unremarkable.   Stomach/Bowel: Stomach is within normal limits. Appendix appears normal. No evidence of bowel wall thickening, distention, or inflammatory changes.   Vascular/Lymphatic: Aortic atherosclerosis. No enlarged abdominal or pelvic lymph nodes.   Reproductive: Prostate is unremarkable.   Other: Small fat containing umbilical and bilateral inguinal hernias are present. There is mild infiltration of the mesenteric fat within the umbilical hernia sac which may reflect changes of incarceration. No abdominopelvic ascites.   Musculoskeletal: No acute or significant osseous findings.   IMPRESSION: 1. Small fat containing umbilical and bilateral inguinal hernias. Mild infiltration of the mesenteric fat within the umbilical hernia sac may reflect changes of incarceration. Correlation with clinical examination is recommended.   Aortic Atherosclerosis (ICD10-I70.0).     Electronically Signed   By: Helyn Numbers M.D.   On: 11/04/2022 22:20     Assessment & Plan:  DIJON COSENS is a 57 y.o. male with an umbilical hernia with recent drainage of fluid and redness/ tenderness.   -Discussed repair and due to the infection and due to the drainage, I do not think we need to use mesh or permanent material  -Discussed repair with sutures and risk of bleeding, infection, recurrence but that we need to get him through this acute phase and likely need skin reconstruction at the umbilicus due to the thinning   Open primary repair with PDS suture   Lucretia Roers 11/13/2022, 3:15 PM

## 2022-11-13 NOTE — Patient Instructions (Signed)
Do not take Ozempic on Sunday. Stop Farxiga after Monday.  Stop Plavix after Saturday.

## 2022-11-13 NOTE — Progress Notes (Signed)
[facility] History and Physical  Reason for Referral: Umbilical hernia  Referring Physician: Rebecka Apley, NP    Chief Complaint   New Patient (Initial Visit)     AXCEL HORSCH is a 57 y.o. male.  HPI: Mr. Fleener reports having a recent infection of his umbilical area where it was red and swollen and draining a foul smelling drainage.  He had a CT that demonstrated an umbilical hernia with fat. He says that since that episode he has been using peroxide in the umbilicus to keep the area clean. The hernia was reducible in the ED but it is stuck out again. The ED felt like the area had intertrigo.   Past Medical History:  Diagnosis Date   COPD (chronic obstructive pulmonary disease) (HCC)    Coronary artery disease    DM (diabetes mellitus) (HCC)    Dyspnea    Heart attack (HCC)    HLD (hyperlipidemia)    HTN (hypertension)    Obesity    Stroke [facility])     Past Surgical History:  Procedure Laterality Date   ANTERIOR CERVICAL DECOMPRESSION/DISCECTOMY FUSION 4 LEVELS N/A 06/10/2021   Procedure: Anterior cervical discectomy and fusion Cervical four to seven;  Surgeon: Venita Lick, MD;  Location: [facility] OR;  Service: Orthopedics;  Laterality: N/A;   CORONARY PRESSURE/FFR STUDY N/A 03/18/2019   Procedure: INTRAVASCULAR PRESSURE WIRE/FFR STUDY;  Surgeon: [name], MD;  Location: [facility];  Service: Cardiovascular;  Laterality: N/A;   LEFT HEART CATH AND CORONARY ANGIOGRAPHY N/A 03/18/2019   Procedure: LEFT HEART CATH AND CORONARY ANGIOGRAPHY;  Surgeon: [name], MD;  Location: [facility];  Service: Cardiovascular;  Laterality: N/A;   SCAR REVISION N/A 06/05/2022   Procedure: excision of neck skin hypertrophic contracture with myriad placement;  Surgeon: Peggye Form, DO;  Location: [facility];  Service: Plastics;  Laterality: N/A;    Family History  Problem Relation Age of Onset   Heart disease Mother    Heart  failure Mother    Atrial fibrillation Mother    Kidney disease Father     Social History   Tobacco Use   Smoking status: Former    Current packs/day: 0.00    Average packs/day: 1 pack/day for 34.0 years (34.0 ttl pk-yrs)    Types: Cigarettes    Start date: 03/16/1986    Quit date: 03/16/2020    Years since quitting: 2.6   Smokeless tobacco: Never   Tobacco comments:    Quit in feb of 2021  Vaping Use   Vaping status: Never Used  Substance Use Topics   Alcohol use: Not Currently    Alcohol/week: 12.0 standard drinks of alcohol    Types: 12 Cans of beer per week    Comment: Quit in Feb 2021   Drug use: Not Currently    Medications: I have reviewed the patient's current medications. Allergies as of 11/13/2022       Reactions   Asa [aspirin] Hives   Zolmitriptan Nausea And Vomiting, Other (See Comments)   Severe headaches        Medication List        Accurate as of November 13, 2022  3:15 PM. If you have any questions, ask your nurse or doctor.          albuterol 108 (90 Base) MCG/ACT inhaler Commonly known as: VENTOLIN HFA Inhale 1-2 puffs into the lungs every 6 (six) hours as needed for wheezing or  shortness of breath.   clopidogrel 75 MG tablet Commonly known as: PLAVIX Take 75 mg by mouth daily.   famotidine 20 MG tablet Commonly known as: Pepcid One after supper   Farxiga 10 MG Tabs tablet Generic drug: dapagliflozin propanediol Take 10 mg by mouth daily.   folic acid 1 MG tablet Commonly known as: FOLVITE Take 1 tablet (1 mg total) by mouth daily.   ipratropium-albuterol 0.5-2.5 (3) MG/3ML Soln Commonly known as: DUONEB Inhale 3 mLs into the lungs every 6 (six) hours as needed (shortness of breath).   losartan 50 MG tablet Commonly known as: COZAAR Take 50 mg by mouth daily.   metFORMIN 1000 MG tablet Commonly known as: Glucophage Take 1 tablet (1,000 mg total) by mouth 2 (two) times daily with a meal.   metoprolol succinate 25 MG 24 hr  tablet Commonly known as: TOPROL-XL Take 1 tablet (25 mg total) by mouth daily. What changed: how much to take   montelukast 10 MG tablet Commonly known as: SINGULAIR Take 1 tablet (10 mg total) by mouth at bedtime.   NovoLIN 70/30 (70-30) 100 UNIT/ML injection Generic drug: insulin NPH-regular Human Inject 50 Units into the skin 2 (two) times daily with a meal.   nystatin powder Commonly known as: MYCOSTATIN/NYSTOP Apply 1 Application topically 3 (three) times daily.   oxyCODONE-acetaminophen 5-325 MG tablet Commonly known as: PERCOCET/ROXICET Take 1 tablet by mouth every 6 (six) hours as needed for severe pain.   Ozempic (1 MG/DOSE) 4 MG/3ML Sopn Generic drug: Semaglutide (1 MG/DOSE) Inject 1 mg into the skin once a week.   pantoprazole 40 MG tablet Commonly known as: PROTONIX TAKE 1 TABLET BY MOUTH ONCE DAILY 30 TO 60 MINUTES BEFORE FIRST MEAL OF THE DAY   pregabalin 300 MG capsule Commonly known as: LYRICA Take 300 mg by mouth 2 (two) times daily.   rosuvastatin 20 MG tablet Commonly known as: CRESTOR Take 20 mg by mouth daily.   thiamine 100 MG tablet Commonly known as: VITAMIN B1 Take 2.5 tablets (250 mg total) by mouth daily. What changed: how much to take   Trelegy Ellipta 100-62.5-25 MCG/ACT Aepb Generic drug: Fluticasone-Umeclidin-Vilant Inhale 1 puff into the lungs daily.   trimethoprim-polymyxin b ophthalmic solution Commonly known as: Polytrim Place 1 drop into the left eye every 4 (four) hours.         ROS:  A comprehensive review of systems was negative except for: Gastrointestinal: positive for abdominal pain and drainage from umbilicus  Blood pressure 118/82, pulse 81, temperature (!) 97.5 F (36.4 C), temperature source Oral, resp. rate 14, height 5\' 9"  (1.753 m), weight 200 lb (90.7 kg), SpO2 95%. Physical Exam Vitals reviewed.  HENT:     Head: Normocephalic.     Nose: Nose normal.  Eyes:     Extraocular Movements: Extraocular  movements intact.  Cardiovascular:     Rate and Rhythm: Normal rate and regular rhythm.  Pulmonary:     Effort: Pulmonary effort is normal.     Breath sounds: Normal breath sounds.  Abdominal:     General: There is no distension.     Palpations: Abdomen is soft.     Tenderness: There is abdominal tenderness.     Hernia: A hernia is present. Hernia is present in the umbilical area.     Comments: Nonreducible   Musculoskeletal:        General: Normal range of motion.     Cervical back: Normal range of motion.  Skin:  General: Skin is warm.  Neurological:     General: No focal deficit present.     Mental Status: He is alert and oriented to person, place, and time.  Psychiatric:        Mood and Affect: Mood normal.        Behavior: Behavior normal.        Thought Content: Thought content normal.     Results: personally reviewed 1.5cm defect, thin skin but nothing else between sac.  CLINICAL DATA:  Bleeding periumbilical hernia. Acute nonlocalized abdominal pain.   EXAM: CT ABDOMEN AND PELVIS WITH CONTRAST   TECHNIQUE: Multidetector CT imaging of the abdomen and pelvis was performed using the standard protocol following bolus administration of intravenous contrast.   RADIATION DOSE REDUCTION: This exam was performed according to the departmental dose-optimization program which includes automated exposure control, adjustment of the mA and/or kV according to patient size and/or use of iterative reconstruction technique.   CONTRAST:  OMNIPAQUE IOHEXOL 300 MG/ML  SOLN   COMPARISON:  None Available.   FINDINGS: Lower chest: No acute abnormality.   Hepatobiliary: No focal liver abnormality is seen. No gallstones, gallbladder wall thickening, or biliary dilatation.   Pancreas: Unremarkable   Spleen: Unremarkable   Adrenals/Urinary Tract: The adrenal glands are unremarkable. The kidneys are normal. Small left posterolateral congenital bladder diverticulum. The bladder  is otherwise unremarkable.   Stomach/Bowel: Stomach is within normal limits. Appendix appears normal. No evidence of bowel wall thickening, distention, or inflammatory changes.   Vascular/Lymphatic: Aortic atherosclerosis. No enlarged abdominal or pelvic lymph nodes.   Reproductive: Prostate is unremarkable.   Other: Small fat containing umbilical and bilateral inguinal hernias are present. There is mild infiltration of the mesenteric fat within the umbilical hernia sac which may reflect changes of incarceration. No abdominopelvic ascites.   Musculoskeletal: No acute or significant osseous findings.   IMPRESSION: 1. Small fat containing umbilical and bilateral inguinal hernias. Mild infiltration of the mesenteric fat within the umbilical hernia sac may reflect changes of incarceration. Correlation with clinical examination is recommended.   Aortic Atherosclerosis (ICD10-I70.0).     Electronically Signed   By: Helyn Numbers M.D.   On: 11/04/2022 22:20     Assessment & Plan:  DIJON COSENS is a 57 y.o. male with an umbilical hernia with recent drainage of fluid and redness/ tenderness.   -Discussed repair and due to the infection and due to the drainage, I do not think we need to use mesh or permanent material  -Discussed repair with sutures and risk of bleeding, infection, recurrence but that we need to get him through this acute phase and likely need skin reconstruction at the umbilicus due to the thinning   Open primary repair with PDS suture   Lucretia Roers 11/13/2022, 3:15 PM

## 2022-11-16 ENCOUNTER — Other Ambulatory Visit: Payer: Self-pay | Admitting: Internal Medicine

## 2022-11-17 ENCOUNTER — Encounter: Payer: Self-pay | Admitting: Cardiology

## 2022-11-17 ENCOUNTER — Ambulatory Visit: Payer: Medicaid Other | Attending: Cardiology | Admitting: Cardiology

## 2022-11-17 VITALS — BP 128/72 | HR 87 | Resp 16 | Ht 69.0 in | Wt 201.6 lb

## 2022-11-17 DIAGNOSIS — R0609 Other forms of dyspnea: Secondary | ICD-10-CM

## 2022-11-17 DIAGNOSIS — I10 Essential (primary) hypertension: Secondary | ICD-10-CM | POA: Diagnosis not present

## 2022-11-17 DIAGNOSIS — I2510 Atherosclerotic heart disease of native coronary artery without angina pectoris: Secondary | ICD-10-CM

## 2022-11-17 DIAGNOSIS — E782 Mixed hyperlipidemia: Secondary | ICD-10-CM

## 2022-11-17 NOTE — Progress Notes (Addendum)
Cardiology Office Note:  .   Date:  11/17/2022  ID:  Glory Buff, DOB [birth year], MRN [id number] PCP: Rebecka Apley, NP  [facility] Providers Cardiologist:  Truett Mainland, MD PCP: Rebecka Apley, NP  Chief Complaint  Patient presents with   Coronary artery disease involving native coronary artery of   Follow-up    6 months      History of Present Illness: .    KELE QUILLAN is a 57 y.o. male with hypertension, hyperlipidemia, uncontrolled DM, obesity, CAD, severe COPD with cardiac/respiratory arrest 2021, former smoker, h/o vertebral artery dissection 2016  Patient is doing well. He denies chest pain, palpitations, leg edema, orthopnea, PND, TIA/syncope.  He has mild exertional dyspnea if he "overdoes" exertion.  Overall, he is pleased with his weight loss and decreasing A1c, 7.5% in 07/2022.  He has upcoming appointment with his PCP with a repeat A1c as well as lipid panel will be checked.   Vitals:   11/17/22 0815  BP: 128/72  Pulse: 87  Resp: 16  SpO2: 98%     ROS:  Review of Systems  Cardiovascular:  Negative for chest pain, dyspnea on exertion, leg swelling, palpitations and syncope.     Studies Reviewed: Marland Kitchen         EKG 11/17/2022: Normal sinus rhythm Normal ECG When compared with ECG of 09-Aug-2021 09:13, PREVIOUS ECG IS PRESENT   Labs 10/2022, no significant abnormality No lipid panel since 2021, LDL was 83 then  CT chest 07/8467: 1. Lung-RADS 2, benign appearance or behavior. Continue annual screening with low-dose chest CT without contrast in 12 months. 2. Coronary artery calcifications, aortic Atherosclerosis (ICD10-I70.0) and Emphysema (ICD10-J43.9).   Echocardiogram 02/2019:  1. Left ventricular ejection fraction, by estimation, is 55 to 60%. The  left ventricle has normal function. The left ventricle has no regional  wall motion abnormalities. Left ventricular diastolic parameters were  normal.   2. Right  ventricular systolic function is normal. The right ventricular  size is normal.   3. The inferior vena cava is dilated in size with >50% respiratory  variability, suggesting right atrial pressure of 8 mmHg.   Conclusion(s)/Recomendation(s): Normal biventricular function without  evidence of hemodynamically significant valvular heart disease.   Left Heart Catheterization 03/18/19:  LV normal LVEF grossly. EDP mardekly elevated Right coronary artery nondominant small, normal. Left main large, normal. Ramus intermediate large, normal. Circumflex large, dominant, normal. LAD ostial 50% stenosis.  FFR 0.89, not hemodynamically significant.        Physical Exam:   Physical Exam Vitals and nursing note reviewed.  Constitutional:      General: He is not in acute distress. Neck:     Vascular: No JVD.  Cardiovascular:     Rate and Rhythm: Normal rate and regular rhythm.     Heart sounds: Normal heart sounds. No murmur heard. Pulmonary:     Effort: Pulmonary effort is normal.     Breath sounds: Normal breath sounds. No wheezing or rales.  Musculoskeletal:     Right lower leg: No edema.     Left lower leg: No edema.      VISIT DIAGNOSES:   ICD-10-CM   1. Coronary artery disease involving native coronary artery of native heart without angina pectoris  I25.10 EKG 12-Lead    2. Dyspnea on exertion  R06.09     3. Mixed hyperlipidemia  E78.2     4. Primary hypertension  I10  ASSESSMENT AND PLAN: .    DAMYEN BURR is a 57 y.o. male with hypertension, hyperlipidemia, uncontrolled DM, obesity, CAD, severe COPD with cardiac/respiratory arrest 2021, former smoker, h/o vertebral artery dissection 2016  CAD: No anginal symptoms. Moderate ostial LAD stenosis 11/2019. Allergic to aspirin, therefore continue Plavix 75 mg daily. Continue metoprolol. Upcoming lipid panel with PCP next month.  If LDL >70, recommend increasing Crestor to 40 daily <70. Continue aggressive  diabetes management as per PCP.  Hypertension: Well-controlled  COPD: Former smoker.  Continue COPD management as per PCP. He is up-to-date with his CT chest lung cancer screening, has not had any adverse findings.   F/u in 1 year  Signed, Elder Negus, MD

## 2022-11-17 NOTE — Patient Instructions (Signed)
Medication Instructions:   Your physician recommends that you continue on your current medications as directed. Please refer to the Current Medication list given to you today.  *If you need a refill on your cardiac medications before your next appointment, please call your pharmacy*    Follow-Up: At [facility], you and your health needs are our priority.  As part of our continuing mission to provide you with exceptional heart care, we have created designated Provider Care Teams.  These Care Teams include your primary Cardiologist (physician) and Advanced Practice Providers (APPs -  Physician Assistants and Nurse Practitioners) who all work together to provide you with the care you need, when you need it.  We recommend signing up for the patient portal called "MyChart".  Sign up information is provided on this After Visit Summary.  MyChart is used to connect with patients for Virtual Visits (Telemedicine).  Patients are able to view lab/test results, encounter notes, upcoming appointments, etc.  Non-urgent messages can be sent to your provider as well.   To learn more about what you can do with MyChart, go to [email or web address].    Your next appointment:   1 year(s)  Provider:   DR. Rosemary Holms

## 2022-11-18 ENCOUNTER — Encounter (HOSPITAL_COMMUNITY)
Admission: RE | Admit: 2022-11-18 | Discharge: 2022-11-18 | Disposition: A | Discharge status: Home / Self Care | Payer: Medicaid Other | Source: Ambulatory Visit | Attending: General Surgery | Admitting: General Surgery

## 2022-11-18 ENCOUNTER — Encounter (HOSPITAL_COMMUNITY): Payer: Self-pay

## 2022-11-19 ENCOUNTER — Ambulatory Visit: Payer: Medicaid Other | Admitting: Cardiology

## 2022-11-21 ENCOUNTER — Ambulatory Visit (HOSPITAL_BASED_OUTPATIENT_CLINIC_OR_DEPARTMENT_OTHER): Payer: Medicaid Other | Admitting: Certified Registered Nurse Anesthetist

## 2022-11-21 ENCOUNTER — Ambulatory Visit (HOSPITAL_COMMUNITY)
Admission: RE | Admit: 2022-11-21 | Discharge: 2022-11-21 | Disposition: A | Discharge status: Home / Self Care | Payer: Medicaid Other | Attending: General Surgery | Admitting: General Surgery

## 2022-11-21 ENCOUNTER — Other Ambulatory Visit: Payer: Self-pay

## 2022-11-21 ENCOUNTER — Encounter (HOSPITAL_COMMUNITY)
Admission: RE | Disposition: A | Discharge status: Home / Self Care | Payer: Self-pay | Source: Home / Self Care | Attending: General Surgery

## 2022-11-21 ENCOUNTER — Ambulatory Visit (HOSPITAL_COMMUNITY): Payer: Medicaid Other | Admitting: Certified Registered Nurse Anesthetist

## 2022-11-21 DIAGNOSIS — I2510 Atherosclerotic heart disease of native coronary artery without angina pectoris: Secondary | ICD-10-CM

## 2022-11-21 DIAGNOSIS — K219 Gastro-esophageal reflux disease without esophagitis: Secondary | ICD-10-CM | POA: Diagnosis not present

## 2022-11-21 DIAGNOSIS — I10 Essential (primary) hypertension: Secondary | ICD-10-CM | POA: Insufficient documentation

## 2022-11-21 DIAGNOSIS — G629 Polyneuropathy, unspecified: Secondary | ICD-10-CM | POA: Insufficient documentation

## 2022-11-21 DIAGNOSIS — Z8249 Family history of ischemic heart disease and other diseases of the circulatory system: Secondary | ICD-10-CM | POA: Insufficient documentation

## 2022-11-21 DIAGNOSIS — I252 Old myocardial infarction: Secondary | ICD-10-CM | POA: Diagnosis not present

## 2022-11-21 DIAGNOSIS — Z955 Presence of coronary angioplasty implant and graft: Secondary | ICD-10-CM | POA: Insufficient documentation

## 2022-11-21 DIAGNOSIS — E119 Type 2 diabetes mellitus without complications: Secondary | ICD-10-CM | POA: Insufficient documentation

## 2022-11-21 DIAGNOSIS — Z794 Long term (current) use of insulin: Secondary | ICD-10-CM | POA: Insufficient documentation

## 2022-11-21 DIAGNOSIS — K420 Umbilical hernia with obstruction, without gangrene: Secondary | ICD-10-CM | POA: Diagnosis present

## 2022-11-21 DIAGNOSIS — Z7984 Long term (current) use of oral hypoglycemic drugs: Secondary | ICD-10-CM | POA: Diagnosis not present

## 2022-11-21 DIAGNOSIS — Z8674 Personal history of sudden cardiac arrest: Secondary | ICD-10-CM | POA: Insufficient documentation

## 2022-11-21 DIAGNOSIS — Z8673 Personal history of transient ischemic attack (TIA), and cerebral infarction without residual deficits: Secondary | ICD-10-CM | POA: Diagnosis not present

## 2022-11-21 DIAGNOSIS — Z7985 Long-term (current) use of injectable non-insulin antidiabetic drugs: Secondary | ICD-10-CM | POA: Diagnosis not present

## 2022-11-21 DIAGNOSIS — G709 Myoneural disorder, unspecified: Secondary | ICD-10-CM | POA: Insufficient documentation

## 2022-11-21 DIAGNOSIS — J449 Chronic obstructive pulmonary disease, unspecified: Secondary | ICD-10-CM | POA: Insufficient documentation

## 2022-11-21 DIAGNOSIS — Z7951 Long term (current) use of inhaled steroids: Secondary | ICD-10-CM | POA: Insufficient documentation

## 2022-11-21 DIAGNOSIS — E785 Hyperlipidemia, unspecified: Secondary | ICD-10-CM | POA: Diagnosis not present

## 2022-11-21 DIAGNOSIS — Z87891 Personal history of nicotine dependence: Secondary | ICD-10-CM | POA: Diagnosis not present

## 2022-11-21 DIAGNOSIS — K429 Umbilical hernia without obstruction or gangrene: Secondary | ICD-10-CM | POA: Diagnosis present

## 2022-11-21 HISTORY — PX: UMBILICAL HERNIA REPAIR: SHX196

## 2022-11-21 LAB — GLUCOSE, CAPILLARY
Glucose-Capillary: 123 mg/dL — ABNORMAL HIGH (ref 70–99)
Glucose-Capillary: 111 mg/dL — ABNORMAL HIGH (ref 70–99)

## 2022-11-21 SURGERY — REPAIR, HERNIA, UMBILICAL, ADULT
Anesthesia: General | Site: Abdomen

## 2022-11-21 MED ORDER — ONDANSETRON HCL 4 MG/2ML IJ SOLN: 4.0000 mg | Freq: Once | INTRAMUSCULAR | Status: DC | PRN

## 2022-11-21 MED ORDER — ROCURONIUM BROMIDE 10 MG/ML (PF) SYRINGE
PREFILLED_SYRINGE | INTRAVENOUS | Status: AC
  Filled 2022-11-21: qty 10

## 2022-11-21 MED ORDER — DEXAMETHASONE SODIUM PHOSPHATE 10 MG/ML IJ SOLN
INTRAMUSCULAR | Status: AC
  Filled 2022-11-21: qty 2

## 2022-11-21 MED ORDER — FENTANYL CITRATE (PF) 100 MCG/2ML IJ SOLN
INTRAMUSCULAR | Status: AC
  Filled 2022-11-21: qty 2

## 2022-11-21 MED ORDER — CHLORHEXIDINE GLUCONATE CLOTH 2 % EX PADS: 6.0000 | MEDICATED_PAD | Freq: Once | CUTANEOUS | Status: DC

## 2022-11-21 MED ORDER — SUCCINYLCHOLINE CHLORIDE 200 MG/10ML IV SOSY
PREFILLED_SYRINGE | INTRAVENOUS | Status: AC
  Filled 2022-11-21: qty 10

## 2022-11-21 MED ORDER — FENTANYL CITRATE (PF) 100 MCG/2ML IJ SOLN
INTRAMUSCULAR | Status: DC | PRN
  Administered 2022-11-21: 100 ug via INTRAVENOUS

## 2022-11-21 MED ORDER — CEFAZOLIN SODIUM-DEXTROSE 2-4 GM/100ML-% IV SOLN
2.0000 g | INTRAVENOUS | Status: AC
  Administered 2022-11-21: 2 g via INTRAVENOUS

## 2022-11-21 MED ORDER — BUPIVACAINE HCL (PF) 0.5 % IJ SOLN
INTRAMUSCULAR | Status: AC
  Filled 2022-11-21: qty 30

## 2022-11-21 MED ORDER — DEXAMETHASONE SODIUM PHOSPHATE 10 MG/ML IJ SOLN
INTRAMUSCULAR | Status: DC | PRN
  Administered 2022-11-21: 10 mg via INTRAVENOUS

## 2022-11-21 MED ORDER — PROPOFOL 10 MG/ML IV BOLUS
INTRAVENOUS | Status: DC | PRN
  Administered 2022-11-21: 150 mg via INTRAVENOUS

## 2022-11-21 MED ORDER — ONDANSETRON HCL 4 MG PO TABS: 4.0000 mg | ORAL_TABLET | Freq: Three times a day (TID) | ORAL | 1 refills | Status: AC | PRN

## 2022-11-21 MED ORDER — ONDANSETRON HCL 4 MG/2ML IJ SOLN
INTRAMUSCULAR | Status: DC | PRN
  Administered 2022-11-21: 4 mg via INTRAVENOUS

## 2022-11-21 MED ORDER — ORAL CARE MOUTH RINSE: 15.0000 mL | Freq: Once | OROMUCOSAL | Status: AC

## 2022-11-21 MED ORDER — PROPOFOL 10 MG/ML IV BOLUS
INTRAVENOUS | Status: AC
  Filled 2022-11-21: qty 20

## 2022-11-21 MED ORDER — MIDAZOLAM HCL 2 MG/2ML IJ SOLN
INTRAMUSCULAR | Status: AC
  Filled 2022-11-21: qty 2

## 2022-11-21 MED ORDER — OXYCODONE HCL 5 MG PO TABS: 5.0000 mg | ORAL_TABLET | ORAL | 0 refills | Status: DC | PRN

## 2022-11-21 MED ORDER — ONDANSETRON HCL 4 MG/2ML IJ SOLN
INTRAMUSCULAR | Status: AC
  Filled 2022-11-21: qty 4

## 2022-11-21 MED ORDER — LACTATED RINGERS IV SOLN: INTRAVENOUS | Status: DC | PRN

## 2022-11-21 MED ORDER — SODIUM CHLORIDE 0.9 % IR SOLN
Status: DC | PRN
  Administered 2022-11-21: 1000 mL

## 2022-11-21 MED ORDER — MIDAZOLAM HCL 5 MG/5ML IJ SOLN
INTRAMUSCULAR | Status: DC | PRN
  Administered 2022-11-21: 2 mg via INTRAVENOUS

## 2022-11-21 MED ORDER — CEFAZOLIN SODIUM-DEXTROSE 2-4 GM/100ML-% IV SOLN
INTRAVENOUS | Status: AC
  Filled 2022-11-21: qty 100

## 2022-11-21 MED ORDER — FENTANYL CITRATE PF 50 MCG/ML IJ SOSY
25.0000 ug | PREFILLED_SYRINGE | INTRAMUSCULAR | Status: DC | PRN
  Administered 2022-11-21: 50 ug via INTRAVENOUS
  Filled 2022-11-21: qty 1

## 2022-11-21 MED ORDER — CLOPIDOGREL BISULFATE 75 MG PO TABS: 75.0000 mg | ORAL_TABLET | Freq: Every day | ORAL | Status: AC

## 2022-11-21 MED ORDER — OXYCODONE HCL 5 MG PO TABS
5.0000 mg | ORAL_TABLET | Freq: Once | ORAL | Status: AC | PRN
  Administered 2022-11-21: 5 mg via ORAL
  Filled 2022-11-21: qty 1

## 2022-11-21 MED ORDER — BUPIVACAINE HCL (PF) 0.5 % IJ SOLN
INTRAMUSCULAR | Status: DC | PRN
  Administered 2022-11-21: 30 mL

## 2022-11-21 MED ORDER — CHLORHEXIDINE GLUCONATE 0.12 % MT SOLN
15.0000 mL | Freq: Once | OROMUCOSAL | Status: AC
  Administered 2022-11-21: 15 mL via OROMUCOSAL

## 2022-11-21 MED ORDER — OXYCODONE HCL 5 MG/5ML PO SOLN
5.0000 mg | Freq: Once | ORAL | Status: AC | PRN
Start: 2022-11-21 — End: 2022-11-21

## 2022-11-21 MED ORDER — PHENYLEPHRINE 80 MCG/ML (10ML) SYRINGE FOR IV PUSH (FOR BLOOD PRESSURE SUPPORT)
PREFILLED_SYRINGE | INTRAVENOUS | Status: AC
  Filled 2022-11-21: qty 10

## 2022-11-21 SURGICAL SUPPLY — 33 items
ADH SKN CLS APL DERMABOND .7 (GAUZE/BANDAGES/DRESSINGS) ×1
APL PRP STRL LF DISP 70% ISPRP (MISCELLANEOUS) ×1
BLADE SURG 15 STRL LF DISP TIS (BLADE) ×1 IMPLANT
BLADE SURG 15 STRL SS (BLADE) ×1
CHLORAPREP W/TINT 26 (MISCELLANEOUS) ×1 IMPLANT
CLOTH BEACON ORANGE TIMEOUT ST (SAFETY) ×1 IMPLANT
COVER LIGHT HANDLE STERIS (MISCELLANEOUS) ×2 IMPLANT
DERMABOND ADVANCED .7 DNX12 (GAUZE/BANDAGES/DRESSINGS) ×1 IMPLANT
DRSG TEGADERM 2-3/8X2-3/4 SM (GAUZE/BANDAGES/DRESSINGS) IMPLANT
ELECT REM PT RETURN 9FT ADLT (ELECTROSURGICAL) ×1
ELECTRODE REM PT RTRN 9FT ADLT (ELECTROSURGICAL) ×1 IMPLANT
GAUZE SPONGE 2X2 STRL 8-PLY (GAUZE/BANDAGES/DRESSINGS) IMPLANT
GLOVE BIO SURGEON STRL SZ 6.5 (GLOVE) ×1 IMPLANT
GLOVE BIO SURGEON STRL SZ7 (GLOVE) IMPLANT
GLOVE BIOGEL PI IND STRL 6.5 (GLOVE) ×1 IMPLANT
GLOVE BIOGEL PI IND STRL 7.0 (GLOVE) ×2 IMPLANT
GLOVE BIOGEL PI IND STRL 7.5 (GLOVE) IMPLANT
GOWN STRL REUS W/TWL LRG LVL3 (GOWN DISPOSABLE) ×2 IMPLANT
KIT TURNOVER KIT A (KITS) ×1 IMPLANT
MANIFOLD NEPTUNE II (INSTRUMENTS) ×1 IMPLANT
MESH VENTRALEX ST 2.5 CRC MED (Mesh General) IMPLANT
NDL HYPO 21X1.5 SAFETY (NEEDLE) ×1 IMPLANT
NEEDLE HYPO 21X1.5 SAFETY (NEEDLE) ×1
NS IRRIG 1000ML POUR BTL (IV SOLUTION) ×1 IMPLANT
PACK MINOR (CUSTOM PROCEDURE TRAY) ×1 IMPLANT
PAD ARMBOARD 7.5X6 YLW CONV (MISCELLANEOUS) ×1 IMPLANT
POSITIONER HEAD 8X9X4 ADT (SOFTGOODS) ×1 IMPLANT
SET BASIN LINEN APH (SET/KITS/TRAYS/PACK) ×1 IMPLANT
SUT ETHIBOND NAB MO 7 #0 18IN (SUTURE) ×1 IMPLANT
SUT MNCRL AB 4-0 PS2 18 (SUTURE) ×1 IMPLANT
SUT VIC AB 3-0 SH 27 (SUTURE) ×3
SUT VIC AB 3-0 SH 27X BRD (SUTURE) ×1 IMPLANT
SYR 30ML LL (SYRINGE) ×2 IMPLANT

## 2022-11-21 NOTE — Progress Notes (Signed)
[facility]  Updated wife. Was able to do permanent repair as the skin was healthy and no signs of infection or breakdown.   No heavy lifting > 10 lbs, excessive bending, pushing, pulling, or squatting for 6-8 weeks after surgery.  Will see in 4 weeks. Binder. Rx to pharmacy.   [name], MD [facility] [name], [place] [address] [phone] (office)

## 2022-11-21 NOTE — Anesthesia Postprocedure Evaluation (Signed)
Anesthesia Post Note  Patient: Robert Rich  Procedure(s) Performed: OPEN UMBILICAL HERNIA REPAIR WITH MESH (Abdomen)  Patient location during evaluation: PACU Anesthesia Type: General Level of consciousness: awake and alert Pain management: pain level controlled Vital Signs Assessment: post-procedure vital signs reviewed and stable Respiratory status: spontaneous breathing, nonlabored ventilation, respiratory function stable and patient connected to nasal cannula oxygen Cardiovascular status: blood pressure returned to baseline and stable Postop Assessment: no apparent nausea or vomiting Anesthetic complications: no   There were no known notable events for this encounter.   Last Vitals:  Vitals:   11/21/22 1133 11/21/22 1141  BP: 130/76 132/83  Pulse: 73 74  Resp: 16 16  Temp: 36.6 C (!) 36.4 C  SpO2: 94% 95%    Last Pain:  Vitals:   11/21/22 1141  TempSrc: Oral  PainSc: 0-No pain                 Gordana Kewley L Ariya Bohannon

## 2022-11-21 NOTE — Anesthesia Preprocedure Evaluation (Addendum)
Anesthesia Evaluation  Patient identified by MRN, date of birth, ID band Patient awake    Reviewed: Allergy & Precautions, NPO status , Patient's Chart, lab work & pertinent test results  History of Anesthesia Complications Negative for: history of anesthetic complications  Airway Mallampati: III  TM Distance: >3 FB Neck ROM: Full   Comment: Previous grade I view with Glidescope, mask with OPA Dental no notable dental hx. (+) Dental Advisory Given, Teeth Intact   Pulmonary neg shortness of breath, neg sleep apnea, COPD (used Trelegy this morning, no recent flares),  COPD inhaler, neg recent URI, former smoker Multiple pulmonary nodules. Stop Bang 4   Pulmonary exam normal breath sounds clear to auscultation       Cardiovascular hypertension, Pt. on medications and Pt. on home beta blockers (-) angina + CAD, + Past MI and + Cardiac Stents (x2, last one 4 years ago)  (-) CABG Normal cardiovascular exam(-) dysrhythmias  Rhythm:Regular Rate:Normal  HLD, h/o cardiac arrest  Lexiscan/modified Bruce Tetrofosmin stress test 05/01/2021: Lexiscan/modified Bruce nuclear stress test performed using 1-day protocol. In addition, patient achieved 2.2 METS and reached 82% MPHR.  Normal myocardial perfusion. Stress LVEF 78%. Low risk study.  Echocardiogram 04/23/2021: Normal LV systolic function with visual EF 60-65%. Left ventricle cavity is normal in size. Normal left ventricular wall thickness. Normal global wall motion. Normal diastolic filling pattern, normal LAP. No significant valvular heart disease. No prior study for comparison.  Left Heart Catheterization 03/18/19:  LV normal LVEF grossly. EDP mardekly elevated Right coronary artery nondominant small, normal. Left main large, normal. Ramus intermediate large, normal. Circumflex large, dominant, normal. LAD ostial 50% stenosis.  FFR 0.89, not hemodynamically significant.       Neuro/Psych neg Seizures PSYCHIATRIC DISORDERS Anxiety     neuropathy  Neuromuscular disease (cervical disc herniation s/p ACDF) CVA (2018), No Residual Symptoms    GI/Hepatic Neg liver ROS,GERD  ,,  Endo/Other  diabetes, Type 2, Oral Hypoglycemic Agents, Insulin Dependent    Renal/GU negative Renal ROS     Musculoskeletal   Abdominal Normal abdominal exam  (+)   Peds  Hematology negative hematology ROS (+)   Anesthesia Other Findings Last Plavix: 5 days ago  Last Ozempic: 05/25/2022  Reproductive/Obstetrics                             Anesthesia Physical Anesthesia Plan  ASA: 3  Anesthesia Plan: General   Post-op Pain Management:    Induction: Intravenous  PONV Risk Score and Plan: 2 and Ondansetron, Dexamethasone, Treatment may vary due to age or medical condition and Midazolam  Airway Management Planned: LMA  Additional Equipment: None  Intra-op Plan:   Post-operative Plan: Extubation in OR  Informed Consent: I have reviewed the patients History and Physical, chart, labs and discussed the procedure including the risks, benefits and alternatives for the proposed anesthesia with the patient or authorized representative who has indicated his/her understanding and acceptance.     Dental advisory given  Plan Discussed with: CRNA and Anesthesiologist  Anesthesia Plan Comments: (Risks of general anesthesia discussed including, but not limited to, sore throat, hoarse voice, chipped/damaged teeth, injury to vocal cords, nausea and vomiting, allergic reactions, lung infection, heart attack, stroke, and death. All questions answered. )       Anesthesia Quick Evaluation

## 2022-11-21 NOTE — Progress Notes (Signed)
[facility]  Notified patient E prescribe not working right now. Printed out Rx.  [name], MD [facility] [address] [place] [place], [place] [address] [phone] (office)

## 2022-11-21 NOTE — Interval H&P Note (Signed)
History and Physical Interval Note:  11/21/2022 9:24 AM  Robert Rich  has presented today for surgery, with the diagnosis of HERNIA, UMBILICAL 1.5 CM.  The various methods of treatment have been discussed with the patient and family. After consideration of risks, benefits and other options for treatment, the patient has consented to  Procedure(s): OPEN UMBILICAL HERNIA REPAIR W/ PRIMARY CLOSURE, ADULT (N/A) as a surgical intervention.  The patient's history has been reviewed, patient examined, no change in status, stable for surgery.  I have reviewed the patient's chart and labs.  Questions were answered to the patient's satisfaction.    Skin is looking much better. No drainage or issues for 3 weeks. No skin break down. Will determine intraoperative if using permanent suture/ mesh versus absorbable.   Lucretia Roers

## 2022-11-21 NOTE — Discharge Instructions (Signed)
Discharge Instructions Hernia:  Can restart your plavix on 10/27; can restart your ozempic on 10/27. Can restart your farxiga 10/26.   Common Complaints: Pain at the incision site is common. This will improve with time. Take your pain medications as described below. Some nausea is common and poor appetite. The main goal is to stay hydrated the first few days after surgery.   Diet/ Activity: Diet as tolerated. You may not have an appetite, but it is important to stay hydrated. Drink 64 ounces of water a day. Your appetite will return with time.  Remove the small clear dressing and gauze after two days (48 hours) 11/23/22.  Trim the gauze off the glue that is underneath if the gauze is stuck to the glue. Shower per your regular routine daily.  Do not take hot showers. Take warm showers that are less than 10 minutes. Hot showers can cause the glue to peel. Rest and listen to your body, but do not remain in bed all day.Walk everyday for at least 15-20 minutes.  Deep cough and move around every 1-2 hours in the first few days after surgery. Do not pick at the dermabond glue on your incision sites.  This glue film will remain in place for 1-2 weeks and will start to peel off. Do not place lotions or balms on your incision unless instructed to specifically by Dr. Henreitta Leber. Do not lift > 10 lbs, perform excessive bending, pushing, pulling, squatting for 6-8 weeks after surgery. Where your abdominal binder with activity as much as possible. The activity restrictions and the abdominal binder are to prevent hernia formation at your incision while you are healing.   Pain Expectations and Narcotics: -After surgery you will have pain associated with your incisions and this is normal. The pain is muscular and nerve pain, and will get better with time. -You are encouraged and expected to take non narcotic medications like tylenol and ibuprofen (when able) to treat pain as multiple modalities can aid with pain  treatment. -Narcotics are only used when pain is severe or there is breakthrough pain. -You are not expected to have a pain score of 0 after surgery, as we cannot prevent pain. A pain score of 3-4 that allows you to be functional, move, walk, and tolerate some activity is the goal. The pain will continue to improve over the days after surgery and is dependent on your surgery. -Due to [place] law, we are only able to give a certain amount of pain medication to treat post operative pain, and we only give additional narcotics on a patient by patient basis.  -For most laparoscopic surgery, studies have shown that the majority of patients only need 10-15 narcotic pills, and for open surgeries most patients only need 15-20.   -Having appropriate expectations of pain and knowledge of pain management with non narcotics is important as we do not want anyone to become addicted to narcotic pain medication.  -Using ice packs in the first 48 hours and heating pads after 48 hours, wearing an abdominal binder (when recommended), and using over the counter medications are all ways to help with pain management.   -Simple acts like meditation and mindfulness practices after surgery can also help with pain control and research has proven the benefit of these practices.  Medication:  Tylenol 1000mg  @ 6am, 12noon, 6pm, (Do not exceed 4000mg  of tylenol a day).  Take Roxicodone for breakthrough pain every 4 hours.  Take Colace for constipation related to narcotic pain  medication. If you do not have a bowel movement in 2 days, take Miralax over the counter.  Drink plenty of water to also prevent constipation.   Contact Information: If you have questions or concerns, please call our office, [phone], Monday- Thursday 8AM-5PM and Friday 8AM-12Noon.  If it is after hours or on the weekend, please call [facility] Main Number, [phone], [phone], and ask to speak to the surgeon on call for Dr. Henreitta Leber at [facility].

## 2022-11-21 NOTE — Anesthesia Procedure Notes (Signed)
Procedure Name: LMA Insertion Date/Time: 11/21/2022 9:37 AM  Performed by: Shanon Payor, CRNAPre-anesthesia Checklist: Patient identified, Emergency Drugs available, Suction available, Patient being monitored and Timeout performed Patient Re-evaluated:Patient Re-evaluated prior to induction Oxygen Delivery Method: Circle system utilized Preoxygenation: Pre-oxygenation with 100% oxygen Induction Type: IV induction LMA: LMA with gastric port inserted LMA Size: 4.0 Number of attempts: 1 Placement Confirmation: positive ETCO2, CO2 detector and breath sounds checked- equal and bilateral Tube secured with: Tape Dental Injury: Teeth and Oropharynx as per pre-operative assessment

## 2022-11-21 NOTE — Transfer of Care (Signed)
Immediate Anesthesia Transfer of Care Note  Patient: Robert Rich  Procedure(s) Performed: OPEN UMBILICAL HERNIA REPAIR WITH MESH (Abdomen)  Patient Location: PACU  Anesthesia Type:General  Level of Consciousness: awake, alert , oriented, and patient cooperative  Airway & Oxygen Therapy: Patient Spontanous Breathing and Patient connected to face mask oxygen  Post-op Assessment: Report given to RN, Post -op Vital signs reviewed and stable, and Patient moving all extremities X 4  Post vital signs: Reviewed and stable  Last Vitals:  Vitals Value Taken Time  BP 148/91 11/21/22 1052  Temp    Pulse 84 11/21/22 1055  Resp 12 11/21/22 1055  SpO2 99 % 11/21/22 1055  Vitals shown include unfiled device data.  Last Pain:  Vitals:   11/21/22 0853  TempSrc: Oral  PainSc: 0-No pain      Patients Stated Pain Goal: 5 (11/21/22 0853)  Complications: No notable events documented.

## 2022-11-21 NOTE — Op Note (Signed)
[facility] Operative Note  11/21/22  Preoperative Diagnosis: Incarcerated umbilical hernia with omentum    Postoperative Diagnosis: Same   Procedure(s) Performed: Open umbilical hernia repair with mesh Ventralex ST patch 6.4cm, defect 2cm    Surgeon: Leatrice Jewels. Henreitta Leber, MD   Assistants: No qualified resident was available    Anesthesia: General endotracheal   Anesthesiologist: Ronelle Nigh, MD    Specimens: None    Estimated Blood Loss: Minimal   Blood Replacement: None    Complications: None   Wound Class: Clean   Operative Indications: Mr. Robert Rich is a 57 yo who has an umbilical hernia. When he first came to see me the skin had been infected and the area had been draining. This had now been resolved for about 3 weeks and the skin was looking healthy. We discussed possible primary repair with absorbable sutures if any concern for infection versus permanent repair with mesh/ permanent sutures if the area was healthy and clean appearing. We discussed risk of bleeding, infection, recurrence, especially a recurrence with absorbable suture.   Findings: Redundant Skin of the umbilical area healthy, no breakdown, no drainage, no signs of any infection or drainage; 2cm defect with  incarcerated omentum in defect    Procedure: The patient was taken to the operating room and placed supine. General endotracheal anesthesia was induced. Intravenous antibiotics were  administered per protocol.  The abdomen was prepared and draped in the usual sterile fashion.   The umbilical hernia was noted to be incarcerated.  An incision was made under the umbilicus, and carried down through the subcutaneous tissue with electrocautery.  Dissection was performed down to the level of the fascia, exposing the hernia sac.  The hernia sac was opened with care, and excess hernia sac was resected with electrocautery. The hernia sac was essentially adherent to the redundant stretched out skin of  the umbilicus, and part of the skin was amputated to allow for better closure of the umbilicus and to prevent any breakdown where the skin was so thinned. Omentum was excised and the hernia defect was noted to be 2cm.  A finger was ran on the underlying peritoneum and this was clear.  A 6.4 cm Ventralex St Hernia Patch was placed and secured with 0 Ethibond sutures ensuring that it was against the peritoneal cavity.  The hernia defect was then closed with 0 Ethibond suture in an interrupted fashion over the patch.  Irrigation was performed. The umbilicus was tacked to the fascia with a 3-0 Vicryl suture.  The deep space was closed with 3-0 Vicryl interrupted. Bupivacaine was injected. Hemostasis was confirmed. The skin was closed with a running 4-0 Monocryl suture and dermabond.  After the dermabond dried a 2X2 and tegaderm were placed over the umbilicus to act as a pressure dressing.    All counts were correct at the end of the case. The patient was awakened from anesthesia and extubated without complication.  The patient went to the PACU in stable condition.  Robert Greenhouse, MD [facility] [address] [place] [place], [place] [address] [phone] (office)

## 2022-11-26 ENCOUNTER — Encounter (HOSPITAL_COMMUNITY): Payer: Self-pay | Admitting: General Surgery

## 2022-12-18 ENCOUNTER — Encounter: Payer: Self-pay | Admitting: General Surgery

## 2022-12-18 ENCOUNTER — Ambulatory Visit: Payer: Medicaid Other | Admitting: General Surgery

## 2022-12-18 VITALS — BP 108/74 | HR 75 | Temp 98.3°F | Resp 14 | Ht 69.0 in | Wt 213.0 lb

## 2022-12-18 DIAGNOSIS — K429 Umbilical hernia without obstruction or gangrene: Secondary | ICD-10-CM | POA: Diagnosis not present

## 2022-12-18 NOTE — Patient Instructions (Addendum)
Between now and 8 weeks 01/16/23 can start to lift 10-20 lbs. After 8 weeks can start to lift more but start back gradually.

## 2022-12-19 ENCOUNTER — Ambulatory Visit (INDEPENDENT_AMBULATORY_CARE_PROVIDER_SITE_OTHER): Payer: Medicaid Other | Admitting: Plastic Surgery

## 2022-12-19 ENCOUNTER — Encounter: Payer: Self-pay | Admitting: Plastic Surgery

## 2022-12-19 VITALS — BP 114/73 | HR 80 | Ht 69.0 in | Wt 213.0 lb

## 2022-12-19 DIAGNOSIS — L905 Scar conditions and fibrosis of skin: Secondary | ICD-10-CM | POA: Diagnosis not present

## 2022-12-19 DIAGNOSIS — M5020 Other cervical disc displacement, unspecified cervical region: Secondary | ICD-10-CM

## 2022-12-19 NOTE — Progress Notes (Signed)
[facility]  Doing well. No issues.   BP 108/74   Pulse 75   Temp 98.3 F (36.8 C) (Oral)   Resp 14   Ht 5\' 9"  (1.753 m)   Wt 213 lb (96.6 kg)   SpO2 94%   BMI 31.45 kg/m  Umbilical area healing, some induration, no signs of recurrence  Patient s/p umbilical hernia repair with mesh. Doing well.  Between now and 8 weeks 01/16/23 can start to lift 10-20 lbs. After 8 weeks can start to lift more but start back gradually.    PRN Follow up   [name], MD [facility] [address] [place] [place] [place], [place] [address] [phone] (office)

## 2022-12-19 NOTE — Progress Notes (Signed)
   Subjective:    Patient ID: Robert Rich, male    DOB: 1965-09-22, 57 y.o.   MRN: [id number]  The patient is a 57 year old male here for follow-up after undergoing skin rearrangement on the anterior cervical neck.  He underwent a cervical spine surgery by Dr. Shon Baton earlier in the year and had a scar contracture.  This was creating a restriction in his head extension.  He is doing really well overall.  There is 1 small area at the top that is a little bit thicker.  He has been doing some massage and Mederma.  He has not been squeezing it so I have asked him to start to do that for a little deeper massage.      Review of Systems  Constitutional: Negative.   Eyes: Negative.   Respiratory: Negative.    Cardiovascular: Negative.   Gastrointestinal: Negative.   Genitourinary: Negative.        Objective:   Physical Exam Constitutional:      Appearance: Normal appearance.  Cardiovascular:     Rate and Rhythm: Normal rate.     Pulses: Normal pulses.  Skin:    Capillary Refill: Capillary refill takes less than 2 seconds.  Neurological:     Mental Status: He is alert and oriented to person, place, and time.  Psychiatric:        Mood and Affect: Mood normal.        Behavior: Behavior normal.        Thought Content: Thought content normal.        Judgment: Judgment normal.           Assessment & Plan:     ICD-10-CM   1. Cervical disc herniation  M50.20        Continue massage and follow-up in 3 months.  No Kenalog injection today.  Will reevaluate in 3 months.  Patient is in agreement.  Pictures were obtained of the patient and placed in the chart with the patient's or guardian's permission.

## 2023-01-16 ENCOUNTER — Telehealth: Payer: Self-pay | Admitting: Plastic Surgery

## 2023-01-16 NOTE — Telephone Encounter (Signed)
called lvmail to call office to r/s, called 01-16-23, provider out of office

## 2023-03-03 ENCOUNTER — Ambulatory Visit: Payer: Medicaid Other | Admitting: Plastic Surgery

## 2023-03-03 ENCOUNTER — Encounter: Payer: Self-pay | Admitting: Plastic Surgery

## 2023-03-03 DIAGNOSIS — L905 Scar conditions and fibrosis of skin: Secondary | ICD-10-CM | POA: Diagnosis not present

## 2023-03-03 NOTE — Progress Notes (Signed)
   Subjective:    Patient ID: Robert Rich, male    DOB: 27-Jun-1965, 58 y.o.   MRN: [id number]  The patient is a 58 year old male here for follow-up.  He had a scar excision with a tissue rearrangement done in May 2024.  Overall he is doing really well and is pleased with his results.  He has had a couple Kenalog  injections to help decrease the thickness of the scar.  Today he feels like it is doing really well and he has no restrictions from the skin from his surgery.   Review of Systems  Constitutional: Negative.   Eyes: Negative.   Respiratory: Negative.    Cardiovascular: Negative.   Gastrointestinal: Negative.   Genitourinary: Negative.   Musculoskeletal: Negative.        Objective:   Physical Exam Vitals reviewed.  Cardiovascular:     Rate and Rhythm: Normal rate.     Pulses: Normal pulses.  Neurological:     Mental Status: He is alert and oriented to person, place, and time.  Psychiatric:        Mood and Affect: Mood normal.        Behavior: Behavior normal.        Thought Content: Thought content normal.        Judgment: Judgment normal.        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   Pictures were obtained of the patient and placed in the chart with the patient's or guardian's permission.

## 2023-03-20 ENCOUNTER — Ambulatory Visit: Payer: Medicaid Other | Admitting: Plastic Surgery

## 2023-03-23 ENCOUNTER — Telehealth: Payer: Self-pay | Admitting: Plastic Surgery

## 2023-03-23 NOTE — Telephone Encounter (Signed)
 Pt wanted to know if Dr Ulice Bold could call him? He said he has question concerning keloid injection - neck , his last visit with her was 03-03-23

## 2023-03-24 ENCOUNTER — Telehealth: Payer: Self-pay | Admitting: *Deleted
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
## 2023-04-13 ENCOUNTER — Ambulatory Visit (HOSPITAL_COMMUNITY)
Admission: RE | Admit: 2023-04-13 | Discharge: 2023-04-13 | Disposition: A | Discharge status: Home / Self Care | Payer: Medicaid Other | Source: Ambulatory Visit | Attending: Adult Health Nurse Practitioner | Admitting: Adult Health Nurse Practitioner

## 2023-04-13 DIAGNOSIS — Z87891 Personal history of nicotine dependence: Secondary | ICD-10-CM | POA: Insufficient documentation

## 2023-04-13 DIAGNOSIS — Z122 Encounter for screening for malignant neoplasm of respiratory organs: Secondary | ICD-10-CM | POA: Diagnosis present

## 2023-04-27 ENCOUNTER — Other Ambulatory Visit: Payer: Self-pay | Admitting: Internal Medicine

## 2023-04-27 DIAGNOSIS — J449 Chronic obstructive pulmonary disease, unspecified: Secondary | ICD-10-CM

## 2023-05-08 ENCOUNTER — Other Ambulatory Visit: Payer: Self-pay | Admitting: Acute Care

## 2023-05-08 DIAGNOSIS — Z87891 Personal history of nicotine dependence: Secondary | ICD-10-CM

## 2023-05-08 DIAGNOSIS — Z122 Encounter for screening for malignant neoplasm of respiratory organs: Secondary | ICD-10-CM

## 2023-05-25 ENCOUNTER — Other Ambulatory Visit: Payer: Self-pay

## 2023-05-25 ENCOUNTER — Other Ambulatory Visit: Payer: Self-pay | Admitting: Internal Medicine

## 2023-05-25 ENCOUNTER — Emergency Department (HOSPITAL_COMMUNITY)
Admission: EM | Admit: 2023-05-25 | Discharge: 2023-05-26 | Discharge status: Against Medical Advice | Attending: Emergency Medicine | Admitting: Emergency Medicine

## 2023-05-25 ENCOUNTER — Emergency Department (HOSPITAL_COMMUNITY)

## 2023-05-25 DIAGNOSIS — E1140 Type 2 diabetes mellitus with diabetic neuropathy, unspecified: Secondary | ICD-10-CM | POA: Diagnosis not present

## 2023-05-25 DIAGNOSIS — I10 Essential (primary) hypertension: Secondary | ICD-10-CM | POA: Diagnosis not present

## 2023-05-25 DIAGNOSIS — Z7951 Long term (current) use of inhaled steroids: Secondary | ICD-10-CM | POA: Insufficient documentation

## 2023-05-25 DIAGNOSIS — J441 Chronic obstructive pulmonary disease with (acute) exacerbation: Secondary | ICD-10-CM | POA: Insufficient documentation

## 2023-05-25 DIAGNOSIS — Z7984 Long term (current) use of oral hypoglycemic drugs: Secondary | ICD-10-CM | POA: Insufficient documentation

## 2023-05-25 DIAGNOSIS — Z794 Long term (current) use of insulin: Secondary | ICD-10-CM | POA: Insufficient documentation

## 2023-05-25 DIAGNOSIS — R0602 Shortness of breath: Secondary | ICD-10-CM | POA: Diagnosis present

## 2023-05-25 DIAGNOSIS — I2510 Atherosclerotic heart disease of native coronary artery without angina pectoris: Secondary | ICD-10-CM | POA: Insufficient documentation

## 2023-05-25 DIAGNOSIS — J449 Chronic obstructive pulmonary disease, unspecified: Secondary | ICD-10-CM

## 2023-05-25 DIAGNOSIS — Z79899 Other long term (current) drug therapy: Secondary | ICD-10-CM | POA: Insufficient documentation

## 2023-05-25 LAB — BASIC METABOLIC PANEL WITH GFR
Anion gap: 10 (ref 5–15)
BUN: 14 mg/dL (ref 6–20)
CO2: 27 mmol/L (ref 22–32)
Calcium: 8.7 mg/dL — ABNORMAL LOW (ref 8.9–10.3)
Chloride: 103 mmol/L (ref 98–111)
Creatinine, Ser: 0.77 mg/dL (ref 0.61–1.24)
GFR, Estimated: >60 mL/min (ref >60)
Glucose, Bld: 184 mg/dL — ABNORMAL HIGH (ref 70–99)
Potassium: 3.8 mmol/L (ref 3.5–5.1)
Sodium: 140 mmol/L (ref 135–145)

## 2023-05-25 LAB — BLOOD GAS, VENOUS
Acid-Base Excess: 4.6 mmol/L — ABNORMAL HIGH (ref 0.0–2.0)
Bicarbonate: 31.6 mmol/L — ABNORMAL HIGH (ref 20.0–28.0)
Drawn by: 53361
O2 Saturation: 62.9 %
Patient temperature: 36.8
pCO2, Ven: 55 mmHg (ref 44–60)
pH, Ven: 7.36 (ref 7.25–7.43)
pO2, Ven: 33 mmHg (ref 32–45)

## 2023-05-25 LAB — CBC
HCT: 41.3 % (ref 39.0–52.0)
Hemoglobin: 12.9 g/dL — ABNORMAL LOW (ref 13.0–17.0)
MCH: 27.6 pg (ref 26.0–34.0)
MCHC: 31.2 g/dL (ref 30.0–36.0)
MCV: 88.2 fL (ref 80.0–100.0)
Platelets: 388 10*3/uL (ref 150–400)
RBC: 4.68 MIL/uL (ref 4.22–5.81)
RDW: 13.4 % (ref 11.5–15.5)
WBC: 12.1 10*3/uL — ABNORMAL HIGH (ref 4.0–10.5)
nRBC: 0 % (ref 0.0–0.2)

## 2023-05-25 MED ORDER — IPRATROPIUM-ALBUTEROL 0.5-2.5 (3) MG/3ML IN SOLN
3.0000 mL | RESPIRATORY_TRACT | Status: AC
  Administered 2023-05-25 (×3): 3 mL via RESPIRATORY_TRACT
  Filled 2023-05-25: qty 6
  Filled 2023-05-25: qty 3

## 2023-05-25 MED ORDER — ALBUTEROL SULFATE HFA 108 (90 BASE) MCG/ACT IN AERS: 2.0000 | INHALATION_SPRAY | RESPIRATORY_TRACT | Status: DC | PRN

## 2023-05-25 MED ORDER — IPRATROPIUM-ALBUTEROL 0.5-2.5 (3) MG/3ML IN SOLN
3.0000 mL | RESPIRATORY_TRACT | Status: DC
Start: 2023-05-25 — End: 2023-05-25

## 2023-05-25 NOTE — ED Provider Notes (Signed)
 [place] [facility]GENCY DEPARTMENT AT [facility] Provider Note   CSN: [id number] Arrival date & time: 05/25/23  2156     History {Add pertinent medical, surgical, social history, OB history to HPI:1} Chief Complaint  Patient presents with   Shortness of Breath    BANNING DEMILLE is a 58 y.o. male with PMH as listed below who presents BIB [facility] from home c/o SOB that began last night, worsened today. Pt took two breathing treatments at home with no relief. Per EMS pt had audible wheezing upon their arrival. Pt received 2 DuoNebs, 125mg  of Solumedrol and 2G of magnesium  PTA. Reports breathing severity is at a 6 right now. Denies pain anywhere.    Past Medical History:  Diagnosis Date   COPD (chronic obstructive pulmonary disease) (HCC)    Coronary artery disease    DM (diabetes mellitus) (HCC)    Dyspnea    Heart attack (HCC)    HLD (hyperlipidemia)    HTN (hypertension)    Neuropathy 2016   Obesity    Stroke [facility])        Home Medications Prior to Admission medications   Medication Sig Start Date End Date Taking? Authorizing Provider  albuterol  (VENTOLIN  HFA) 108 (90 Base) MCG/ACT inhaler Inhale 1-2 puffs into the lungs every 6 (six) hours as needed for wheezing or shortness of breath.    [provider]  ANORO ELLIPTA  62.5-25 MCG/ACT AEPB Inhale 1 puff by mouth once daily 11/16/22   Wert, Michael B, MD  clopidogrel  (PLAVIX ) 75 MG tablet Take 1 tablet (75 mg total) by mouth daily. 11/23/22   Awilda Bogus, MD  DULoxetine (CYMBALTA) 30 MG capsule Take 30 mg by mouth 2 (two) times daily.    [provider]  famotidine  (PEPCID ) 20 MG tablet TAKE 1 TABLET BY MOUTH ONCE DAILY AFTER SUPPER 04/27/23   Wert, Michael B, MD  FARXIGA  10 MG TABS tablet Take 10 mg by mouth daily. 04/15/21   [provider]  folic acid  (FOLVITE ) 1 MG tablet Take 1 tablet (1 mg total) by mouth daily. 07/22/19   Justina Oman, MD  losartan  (COZAAR ) 50 MG tablet Take 50  mg by mouth daily. 02/10/21   [provider]  metoprolol  succinate (TOPROL -XL) 25 MG 24 hr tablet Take 1 tablet (25 mg total) by mouth daily. 07/22/19   Justina Oman, MD  montelukast  (SINGULAIR ) 10 MG tablet Take 1 tablet (10 mg total) by mouth at bedtime. 08/11/19   Demaris Fillers, MD  nystatin  (MYCOSTATIN /NYSTOP ) powder Apply 1 Application topically 3 (three) times daily. Patient taking differently: Apply 1 Application topically daily. 11/04/22   Mordecai Applebaum, MD  ondansetron  (ZOFRAN ) 4 MG tablet Take 1 tablet (4 mg total) by mouth every 8 (eight) hours as needed. 11/21/22 11/21/23  Awilda Bogus, MD  oxyCODONE  (ROXICODONE ) 5 MG immediate release tablet Take 1 tablet (5 mg total) by mouth every 4 (four) hours as needed for severe pain (pain score 7-10) or breakthrough pain. 11/21/22 11/21/23  Awilda Bogus, MD  OZEMPIC, 1 MG/DOSE, 4 MG/3ML SOPN Inject 1 mg into the skin every Sunday. 02/25/22   [provider]  pregabalin  (LYRICA ) 300 MG capsule Take 300 mg by mouth 2 (two) times daily. 02/19/22   [provider]  rosuvastatin  (CRESTOR ) 20 MG tablet Take 20 mg by mouth daily.    [provider]  sildenafil (VIAGRA) 100 MG tablet Take 100 mg by mouth as needed for erectile dysfunction. 10/20/22  [provider]  Thiamine  HCl (B-1) 250 MG TABS Take 250 mg by mouth daily.    [provider]      Allergies    Asa [aspirin] and Zolmitriptan    Review of Systems   Review of Systems A 10 point review of systems was performed and is negative unless otherwise reported in HPI.  Physical Exam Updated Vital Signs BP (!) 140/84   Pulse 99   Temp 98.2 F (36.8 C) (Oral)   Resp (!) 25   SpO2 92%  Physical Exam General: Mlid resp distress, lying in bed.  HEENT: PERRLA, Sclera anicteric, MMM, trachea midline.  Cardiology: RRR, no murmurs/rubs/gallops. BL radial and DP pulses equal bilaterally.  Resp: Increased WOB, increased rate. Mild  resp distress. Diffuse expiratory wheezing. Intermittently coughing. Satting 92% on RA.  Abd: Soft, non-tender, non-distended. No rebound tenderness or guarding.  GU: Deferred. MSK: No peripheral edema or signs of trauma. Extremities without deformity or TTP.  Skin: warm, dry.  Neuro: A&Ox4, CNs II-XII grossly intact. MAEs. Sensation grossly intact.  Psych: Normal mood and affect.   ED Results / Procedures / Treatments   Labs (all labs ordered are listed, but only abnormal results are displayed) Labs Reviewed  BASIC METABOLIC PANEL WITH GFR - Abnormal; Notable for the following components:      Result Value   Glucose, Bld 184 (*)    Calcium  8.7 (*)    All other components within normal limits  CBC - Abnormal; Notable for the following components:   WBC 12.1 (*)    Hemoglobin 12.9 (*)    All other components within normal limits  BLOOD GAS, VENOUS - Abnormal; Notable for the following components:   Bicarbonate 31.6 (*)    Acid-Base Excess 4.6 (*)    All other components within normal limits  BRAIN NATRIURETIC PEPTIDE    EKG EKG Interpretation Date/Time:  Monday May 25 2023 22:08:31 EDT Ventricular Rate:  100 PR Interval:  185 QRS Duration:  87 QT Interval:  362 QTC Calculation: 472 R Axis:   32  Text Interpretation: Sinus tachycardia Probable anteroseptal infarct, old Confirmed by Annita Kindle [phone]) on 05/25/2023 10:12:20 PM  Radiology DG Chest 2 View Result Date: 05/25/2023 CLINICAL DATA:  COPD.  Chest pain.  Cough and wheezing. EXAM: CHEST - 2 VIEW COMPARISON:  CT chest 04/13/2023 FINDINGS: Heart size and pulmonary vascularity are normal. Lungs are clear. No pleural effusion or pneumothorax. Mediastinal contours appear intact. Postoperative changes in the cervical spine. Degenerative changes in the thoracic spine. Old rib fractures. IMPRESSION: No active cardiopulmonary disease. Electronically Signed   By: Boyce Byes M.D.   On: 05/25/2023 22:48     Procedures Procedures  {Document cardiac monitor, telemetry assessment procedure when appropriate:1}  Medications Ordered in ED Medications  ipratropium-albuterol  (DUONEB) 0.5-2.5 (3) MG/3ML nebulizer solution 3 mL (has no administration in time range)    ED Course/ Medical Decision Making/ A&P                          Medical Decision Making Amount and/or Complexity of Data Reviewed Labs: ordered. Decision-making details documented in ED Course. Radiology: ordered. Decision-making details documented in ED Course.  Risk Prescription drug management.    This patient presents to the ED for concern of ***, this involves an extensive number of treatment options, and is a complaint that carries with it a high risk of complications and morbidity.  I considered the following  differential and admission for this acute, potentially life threatening condition.   MDM:    ***  Clinical Course as of 05/25/23 2255  Mon May 25, 2023  2238 Blood gas, venous (at [facility] and [facility])(!) No hypercarbia [HN]  2238 WBC(!): 12.1 +leukocytosis [HN]  2251 DG Chest 2 View No active cardiopulmonary disease. [HN]    Clinical Course User Index [HN] Merdis Stalling, MD    Labs: I Ordered, and personally interpreted labs.  The pertinent results include:  those lsited above  Imaging Studies ordered: I ordered imaging studies including CXR I independently visualized and interpreted imaging. I agree with the radiologist interpretation  Additional history obtained from chart review, EMS, wife at bedside.   Cardiac Monitoring: The patient was maintained on a cardiac monitor.  I personally viewed and interpreted the cardiac monitored which showed an underlying rhythm of: sinus tachycardia  Reevaluation: After the interventions noted above, I reevaluated the patient and found that they have :{resolved/improved/worsened:23923::"improved"}  Social Determinants of Health: lives  independently  Disposition:  ***  Co morbidities that complicate the patient evaluation  Past Medical History:  Diagnosis Date   COPD (chronic obstructive pulmonary disease) (HCC)    Coronary artery disease    DM (diabetes mellitus) (HCC)    Dyspnea    Heart attack (HCC)    HLD (hyperlipidemia)    HTN (hypertension)    Neuropathy 2016   Obesity    Stroke (HCC)      Medicines Meds ordered this encounter  Medications   DISCONTD: albuterol  (VENTOLIN  HFA) 108 (90 Base) MCG/ACT inhaler 2 puff   DISCONTD: ipratropium-albuterol  (DUONEB) 0.5-2.5 (3) MG/3ML nebulizer solution 3 mL   ipratropium-albuterol  (DUONEB) 0.5-2.5 (3) MG/3ML nebulizer solution 3 mL    I have reviewed the patients home medicines and have made adjustments as needed  Problem List / ED Course: Problem List Items Addressed This Visit   None        {Document critical care time when appropriate:1} {Document review of labs and clinical decision tools ie heart score, Chads2Vasc2 etc:1}  {Document your independent review of radiology images, and any outside records:1} {Document your discussion with family members, caretakers, and with consultants:1} {Document social determinants of health affecting pt's care:1} {Document your decision making why or why not admission, treatments were needed:1}  This note was created using dictation software, which may contain spelling or grammatical errors.

## 2023-05-25 NOTE — ED Triage Notes (Signed)
 Pt bib [facility] from home c/o SOB that began last night, worsened today. Pt took two breathing treatments at home with no relief. Per EMS pt had audible wheezing upon their arrival. Pt received 2 DuoNebs, 125mg  of Solumedrol and 2G of magnesium  PTA.  Hx COPD

## 2023-05-26 LAB — BRAIN NATRIURETIC PEPTIDE: B Natriuretic Peptide: 52 pg/mL (ref 0.0–100.0)

## 2023-05-26 MED ORDER — PREDNISONE 20 MG PO TABS
40.0000 mg | ORAL_TABLET | Freq: Every day | ORAL | 0 refills | Status: AC
Start: 2023-05-26 — End: 2023-05-31

## 2023-05-26 NOTE — Discharge Instructions (Addendum)
 Thank you for coming to [facility] Emergency Department. You were seen for shortness of breath. We did an exam, labs, and imaging, and these showed a COPD exacerbation. Please take prednisone  40 mg one per day for 5 days. Please schedule your nebulizers every 4-6 hours for a few days. You were offered further treatment and observation but you declined.  Please follow up with your primary care provider within 1 week.   Do not hesitate to return to the ED or call 911 if you experience: -Worsening symptoms -Lightheadedness, passing out -Fevers/chills -Anything else that concerns you

## 2023-05-27 ENCOUNTER — Other Ambulatory Visit: Payer: Self-pay | Admitting: Internal Medicine

## 2023-05-27 DIAGNOSIS — J449 Chronic obstructive pulmonary disease, unspecified: Secondary | ICD-10-CM

## 2023-06-30 ENCOUNTER — Ambulatory Visit (INDEPENDENT_AMBULATORY_CARE_PROVIDER_SITE_OTHER): Admitting: Internal Medicine

## 2023-06-30 ENCOUNTER — Encounter: Payer: Self-pay | Admitting: Internal Medicine

## 2023-06-30 VITALS — BP 108/71 | HR 68 | Ht 69.0 in | Wt 200.2 lb

## 2023-06-30 DIAGNOSIS — J449 Chronic obstructive pulmonary disease, unspecified: Secondary | ICD-10-CM

## 2023-06-30 DIAGNOSIS — J302 Other seasonal allergic rhinitis: Secondary | ICD-10-CM

## 2023-06-30 DIAGNOSIS — Z87891 Personal history of nicotine dependence: Secondary | ICD-10-CM

## 2023-06-30 MED ORDER — MONTELUKAST SODIUM 10 MG PO TABS: ORAL_TABLET | ORAL | 3 refills | Status: AC

## 2023-06-30 MED ORDER — ALBUTEROL SULFATE (2.5 MG/3ML) 0.083% IN NEBU: INHALATION_SOLUTION | RESPIRATORY_TRACT | 12 refills | Status: AC

## 2023-06-30 NOTE — Progress Notes (Unsigned)
 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 male former smoker with medical history significant for severe COPD, coronary artery disease, type 2 diabetes mellitus, dyslipidemia, alcohol use disorder and substance abuse with benzodiazepines and history of prior cocaine use was recently hospitalized discharged 2 weeks ago for a COPD exacerbation.  He reports that he felt well after going home but when he ran out of steroids and antibiotics his symptoms slowly began to return over the course of the last 3 days.  He became so severely short of breath today that he called EMS.  He says that he had return to work last week and was working short hours up until 3 days ago when he had increased his work hours and noticed that his shortness of breath became acutely worse.  He reports significant wheezing and increased work of breathing coughing and chest discomfort.  He denies chest pain.  He reports productive cough with yellow sputum.  He can only walk a few feet without severe shortness of breath.   Discharge Diagnoses:    Acute on chronic respiratory failure with hypoxia  -secondary to severe COPD exacerbation  --symptoms rapidly returned after he completed his steroid taper and antibiotics from recent hospitalization. -He has an outpatient pulmonary appt later this month but I requested inpatient consult given multiple admissions and ED visits.  -appreciate pulmonary follow up-->wean prednisone  over 2 weeks   COPD Exacerbation -continue duonebs -de-escalate to po prednisone   - Appreciate pulmonary consult.  Discussed with Dr. Artemio Robert Rich prednisone  over next 2 weeks    -Unfortunately patient could not tolerate brovana  neb treatments.   -Continue Duonebs, continue pulmicort  nebs  -Pulmonary willl see him again on outpatient follow up 08/24/19.   -They will arrange PFTs.  - He was given lasix  20 mg IV daily x 4 total doses during the hospitalization.     CAD  - stable, no chest pain symptoms, resumed his home heart medications.    Tobacco abuse - He reports he quit several months ago.  Polysubstance abuse  - UDS positive for benzo, opioid and barbituates.   Anxiety - alprazolam  as needed only for severe symptoms.  Type 2 DM with steroid induced hyperglycemia  - increased doses of insulin  and follow closely especially while on IV steroids.    -Pt will need to DC home on insulin .  He has used insulin  before.  He prefers vials and syringes.  He will be on steroids for at least 2 weeks after discharge and will need insulin  plus metformin  to help control his steroid induced hyperglycemia -d/c home with novolin  70/30--50 units bid -instructed pt to continue checking CBGs ac/hs      overall 50% improved at d/c on prednisone  tapered off 08/20/19 and about the same as at d/c but no longer using neb / maint on Anoro      History of Present Illness  08/24/2019  Pulmonary/ 1st Robert Rich eval/ Robert Robert Robert Rich  Chief Complaint  Patient presents with   Follow-up    shortness of breath with exertion  Dyspnea:  MMRC3 = can't walk  100 yards even at a slow pace at a flat grade s stopping due to sob   Cough: resolved  Sleep: flat  SABA use: combivent  and anoro both in am then no other saba  rec Plan A = Automatic = Always=   Anoro one click take two drags Plan B = Backup (to supplement plan A, not to replace it) Only use your albuterol  inhaler as a rescue medication Try albuterol  15 min before an activity that you know would make you short of breath     Plan C = Crisis (instead of Plan B but only if Plan B stops working) - only use your albuterol   nebulizer if you first try Plan B  Make sure you check your oxygen  saturations at highest level of activity  Please schedule a follow up visit in 3 months with PFTs  but call sooner if needed  - ok to delay if you haven't got your insurance.    03/28/2022  f/u ov/Robert Robert Rich/Robert Robert Rich re: AB exac x 6 weeks  maint on anoro   Chief Complaint  Patient presents with   Acute Visit    Acute- New SOB and cough with green mucus   Dyspnea:  still doing track 12 min  x 4 days per week  Cough: assoc with nasal congestion and nasty drainage flared twice in last 6 weeks prior to OV   Sleeping:flat bed 2 pillows under head  SABA use: twice a month hfa/ every 6 hours x 2 weeks  02: none  Covid status: vax x 3 / infection 2023  Lung cancer screening: referred today Rec My Robert Rich will be contacting you by phone for referral for  lung cancer screening    Stop anoro and start trelegy 100 each am  Work on inhaler technique:    Please remember to go to the lab department   > did not go       05/07/2022  f/u ov/Robert Robert Rich/Robert Robert Rich re: AB  maint on Trelegy   and steadily worse Chief Complaint  Patient presents with   Follow-up    Pt f/u states that he is having coughing/wheezing w/ phlegm ranging from bloody, green, and brownish. Onset was 03/30/2022  Dyspnea:  ok if not coughing  Cough: variably nasty from nose and chest day > noct  Sleeping: sleeps ok flat 2 pillows  SABA use: none not hfa or neb  02: none  Lung cancer screening: every march  Rec Stop trelegy  Augmentin  875 mg take one pill twice daily  X 10 days For cough > mucinex  dm 1200 mg (otc) every 12 hours and supplement with tylenol  #3 one every 4 hours as needed with goal of no coughing at all  Prednisone  10 mg take  4 each am x 2 days,   2 each am x 2 days,  1 each am x 2 days and stop  Duoneb up to 4 x daily if needed for breathing  Pantoprazole  (protonix ) 40 mg   Take  30-60 min before first meal of the day and Pepcid  (famotidine )   20 mg after supper until return to Robert Rich  GERD diet reviewed, bed blocks rec   Please schedule a follow up Robert Rich visit in 2 weeks, sooner if needed  with all medications /inhalers/ solutions in hand    05/07/2022  :  allergy screen Eos 0.3/IGE 256  alpha one AT phenotype  MM  level 147    05/23/2022  f/u ov/Robert Robert Rich/Robert Robert Rich re: AB maint back  on trelegy 100  Chief Complaint  Patient presents with   Follow-up    Pt f/u states that he stopped the trelegy like Dr.Jalayah Gutridge directed @ [facility] but 2 days ago he began feeling like he was suffocating. He states that he restarted it and feels better the only issue currently bothering him is the pollen and sinus drainage.   Dyspnea:  only if does push mower  Cough: better but starting to have nasal congestion again with pollen exposure Sleeping: flat bed 2 pillows  SABA use: once a day hfa/ neb gives him ha  02: none  Rec For nasal congestion >  zyrtec D per bottle instructions  For cough then can still add mucinex  dm 1200 mg every 12 hours My Robert Rich will be contacting you by phone for referral to allergy in [place]   Pulmonary follow up is as needed      06/30/2023  f/u ov/[place] Robert Rich/Robert Robert Rich re: AB  maint on nothing  / did not try zyrtec D, not taking mucinex  dm either Chief Complaint  Patient presents with   COPD   Dyspnea:  mostly when bends over  Cough: mostly daytime  upper airway patternwith lots of obvious nasal secretions (watery) and congestion Sleeping: bed is flat 2 pillows s   resp cc (despite nasal symptoms daytime)  SABA use: last used duoneb x 6 weeks  02: none Lung cancer screening 04/13/23  RADS 2   No obvious day to day or daytime variability or assoc excess/ purulent sputum or mucus plugs or hemoptysis or cp or chest tightness, subjective wheeze or overt sinus or hb symptoms.    Also denies any obvious fluctuation of symptoms with weather or environmental changes or other aggravating or alleviating factors except as  outlined above   No unusual exposure hx or h/o childhood pna/ asthma or knowledge of premature birth.  Current Allergies, Complete Past Medical History, Past Surgical History, Family History, and Social History were reviewed in Robert Robert Rich record.  ROS  The following are not active complaints unless bolded Hoarseness, sore throat, dysphagia, dental problems, itching, sneezing,  nasal congestion or discharge of excess mucus or purulent secretions, ear ache,   fever, chills, sweats, unintended wt loss or wt gain, classically pleuritic or exertional cp,  orthopnea pnd or arm/hand swelling  or leg swelling, presyncope, palpitations, abdominal pain, anorexia, nausea, vomiting, diarrhea  or change in bowel habits or change in bladder habits, change in stools or change in urine, dysuria, hematuria,  rash, arthralgias, visual complaints, headache, numbness, weakness or ataxia or problems with walking or coordination,  change in mood or  memory.        Current Meds  Medication Sig   albuterol  (VENTOLIN  HFA) 108 (90 Base) MCG/ACT inhaler Inhale 1-2 puffs into the lungs every 6 (six) hours as needed for wheezing or shortness of breath.   ANORO ELLIPTA  62.5-25 MCG/ACT AEPB Inhale 1 puff by mouth once daily   clopidogrel  (PLAVIX ) 75 MG tablet Take 1 tablet (75 mg total) by mouth daily.   Cobalamin Combinations (OPURITY B12/FOLIC ACID ) 1000-200 MCG TABS Take by mouth.   DULoxetine (CYMBALTA) 30 MG capsule Take 30 mg by mouth 2 (two) times daily.   famotidine  (PEPCID ) 20 MG tablet TAKE 1 TABLET BY MOUTH ONCE DAILY AFTER SUPPER   FARXIGA  10 MG TABS tablet Take 10 mg by mouth daily.   folic acid  (FOLVITE ) 1 MG tablet Take 1 tablet (1 mg total) by mouth daily.   ipratropium-albuterol  (  DUONEB) 0.5-2.5 (3) MG/3ML SOLN Take 3 mLs by nebulization every 4 (four) hours as needed.   losartan  (COZAAR ) 50 MG tablet Take 50 mg by mouth daily.   metoprolol  succinate (TOPROL -XL) 25 MG 24 hr tablet  Take 1 tablet (25 mg total) by mouth daily.   montelukast  (SINGULAIR ) 10 MG tablet Take 1 tablet (10 mg total) by mouth at bedtime.   ondansetron  (ZOFRAN ) 4 MG tablet Take 1 tablet (4 mg total) by mouth every 8 (eight) hours as needed.   OZEMPIC, 1 MG/DOSE, 4 MG/3ML SOPN Inject 1 mg into the skin every Sunday.   pregabalin (LYRICA) 300 MG capsule Take 300 mg by mouth 2 (two) times daily.   rosuvastatin (CRESTOR) 20 MG tablet Take 20 mg by mouth daily.   sildenafil (VIAGRA) 100 MG tablet Take 100 mg by mouth as needed for erectile dysfunction.   Thiamine HCl (B-1) 250 MG TABS Take 250 mg by mouth daily.   [DISCONTINUED] nystatin (MYCOSTATIN/NYSTOP) powder Apply 1 Application topically 3 (three) times daily. (Patient taking differently: Apply 1 Application topically daily.)   [DISCONTINUED] oxyCODONE (ROXICODONE) 5 MG immediate release tablet Take 1 tablet (5 mg total) by mouth every 4 (four) hours as needed for severe pain (pain score 7-10) or breakthrough pain.                  Past Medical History:  Diagnosis Date   COPD (chronic obstructive pulmonary disease) (HCC)    Coronary artery disease    DM (diabetes mellitus) (HCC)    Heart attack (HCC)    HLD (hyperlipidemia)    HTN (hypertension)    Obesity    Stroke (HCC)          Objective:    Wts  06/30/2023          200  05/23/2022        215   05/07/2022        222  03/28/2022          224  04/17/2021        239 08/08/2020        233  06/27/2020          232  04/30/2020          24 0   11/29/19 248 lb 6.4 oz (112.7 kg)  11/01/19 250 lb (113.4 kg)  10/25/19 250 lb (113.4 kg)    Vital signs reviewed  06/30/2023  - Note at rest 02 sats  98% on RA   General appearance:    amb anxious wm, easily confused with details of care and jumping from one symptom to another    HEENT : Oropharynx  clear  Nasal turbinates mod severe edema/ watery secretions bilaterally   NECK :  without  apparent JVD/ palpable Nodes/TM    LUNGS: no acc  muscle use,  Min barrel  contour chest wall with bilateral  slightly decreased bs s audible wheeze and  without cough on insp or exp maneuvers and min  Hyperresonant  to  percussion bilaterally    CV:  RRR  no s3 or murmur or increase in P2, and no edema   ABD:  soft and nontender with pos end  insp Hoover's  in the supine position.  No bruits or organomegaly appreciated   MS:  Nl gait/ ext warm without deformities Or obvious joint restrictions  calf tenderness, cyanosis or clubbing     SKIN: warm and dry without lesions    NEURO:  alert, approp, nl sensorium with  no motor or cerebellar deficits apparent.              Assessment

## 2023-06-30 NOTE — Patient Instructions (Addendum)
 My office will be contacting you by phone for referral to [facility] [place]   - if you don't hear back from my office within one week please call us  back or notify us  thru MyChart and we'll address it right away.   Continue montelukast  10 mg each pm until you see the allergist  Continue anoro one click each am   For short of breath/ cough/ congestion, wheeze > albuterol  2.5 mg up to every 4 hours as needed per nebulizer   Be sure to take all medications with you   Pulmonary follow up is as needed

## 2023-07-01 NOTE — Assessment & Plan Note (Signed)
 05/07/2022  :  allergy screen Eos 0.3/IGE 256 >  referred to allergy 05/23/2022 > again 06/30/2023   Most of his symptoms originate in the upper airway and could well be atopic in nature > try allergy eval and f/u here prn   Discussed in detail all the  indications, usual  risks and alternatives  relative to the benefits with patient who agrees to proceed with w/u as outlined.            Each maintenance medication was reviewed in detail including emphasizing most importantly the difference between maintenance and prns and under what circumstances the prns are to be triggered using an action plan format where appropriate.  Total time for H and P, chart review, counseling, reviewing dpi device(s) and generating customized AVS unique to this office visit / same day charting = 33 min final summary f/u ov

## 2023-07-01 NOTE — Assessment & Plan Note (Addendum)
 MM/Quit smoking 02/2019 at wt 89  -  Spirometry only on 11/01/19  No obst p anoro prior to study and minimal concavity to f/v loop  - 04/30/2020  After extensive coaching inhaler device,  effectiveness =    75% > try breztri  2bid > "too jittery" but flared on anoro  - 06/27/2020  After extensive coaching inhaler device,  effectiveness =    75% > rechallenge with breztri  one bid  - 08/08/2020  Improved on anoro > continue  - Sinus MRI  08/09/21 : Moderate paranasal sinus mucosal thickening, greatest in the left maxillary sinus. - 03/28/2022  After extensive coaching inhaler device,  effectiveness =    90% dpi / elipta > change trelegy 100 due to freq aecopd x 4 week trial then ov   FENO  03/28/2022 = 21 - LDSCT   04/11/22  Mild centrilobular emphysema. -  05/07/2022  :  allergy screen Eos 0.3/IGE 256  alpha one AT phenotype  MM  level 147   - d/c trelegy rx as uacs 05/07/2022 with just duoneb to reduce upper airway irritability >>> could not tol duoneb (HA) and started back on trelegy 05/21/22 improved to baseline doe - 06/30/2023 maint on anoro at his request "the best inhaler ever"  He is really not limited by doe, just sob when bends over, so no need for any change in resp rx for now - will see back if symptoms aren't improved to his satisfaction by allergist > referred to [facility] allergy   Rec Continue anoro for now - supplement with neb saba prn

## 2023-09-23 ENCOUNTER — Ambulatory Visit: Admitting: Allergy & Immunology

## 2023-11-02 ENCOUNTER — Ambulatory Visit: Admitting: Internal Medicine

## 2024-04-13 ENCOUNTER — Ambulatory Visit (HOSPITAL_COMMUNITY)
